# Patient Record
Sex: Male | Born: 1960 | Race: Black or African American | Hispanic: No | Marital: Single | Smoking: Current every day smoker
Health system: Southern US, Community
[De-identification: ages and names within clinical notes are randomized; demographics above are authoritative.]

## PROBLEM LIST (undated history)

## (undated) DIAGNOSIS — Z789 Other specified health status: Secondary | ICD-10-CM

## (undated) DIAGNOSIS — Z7289 Other problems related to lifestyle: Secondary | ICD-10-CM

---

## 1998-05-04 ENCOUNTER — Emergency Department (HOSPITAL_COMMUNITY): Admission: EM | Admit: 1998-05-04 | Discharge: 1998-05-04 | Payer: Self-pay | Admitting: Emergency Medicine

## 1998-05-29 ENCOUNTER — Emergency Department (HOSPITAL_COMMUNITY): Admission: EM | Admit: 1998-05-29 | Discharge: 1998-05-29 | Payer: Self-pay | Admitting: Emergency Medicine

## 1998-11-22 ENCOUNTER — Emergency Department (HOSPITAL_COMMUNITY): Admission: EM | Admit: 1998-11-22 | Discharge: 1998-11-22 | Payer: Self-pay | Admitting: Emergency Medicine

## 1999-11-01 ENCOUNTER — Emergency Department (HOSPITAL_COMMUNITY): Admission: EM | Admit: 1999-11-01 | Discharge: 1999-11-01 | Payer: Self-pay

## 2000-07-17 ENCOUNTER — Emergency Department (HOSPITAL_COMMUNITY): Admission: EM | Admit: 2000-07-17 | Discharge: 2000-07-17 | Payer: Self-pay | Admitting: Emergency Medicine

## 2000-08-17 ENCOUNTER — Encounter: Payer: Self-pay | Admitting: Orthopedic Surgery

## 2000-08-17 ENCOUNTER — Ambulatory Visit (HOSPITAL_COMMUNITY): Admission: RE | Admit: 2000-08-17 | Discharge: 2000-08-17 | Payer: Self-pay | Admitting: Orthopedic Surgery

## 2000-08-20 ENCOUNTER — Encounter: Payer: Self-pay | Admitting: Orthopedic Surgery

## 2000-08-20 ENCOUNTER — Ambulatory Visit (HOSPITAL_COMMUNITY): Admission: RE | Admit: 2000-08-20 | Discharge: 2000-08-20 | Payer: Self-pay | Admitting: Orthopedic Surgery

## 2006-12-08 ENCOUNTER — Emergency Department (HOSPITAL_COMMUNITY): Admission: EM | Admit: 2006-12-08 | Discharge: 2006-12-08 | Payer: Self-pay | Admitting: Emergency Medicine

## 2013-06-14 ENCOUNTER — Ambulatory Visit: Payer: Self-pay

## 2014-05-31 ENCOUNTER — Emergency Department (HOSPITAL_COMMUNITY): Payer: Self-pay

## 2014-05-31 ENCOUNTER — Emergency Department (HOSPITAL_COMMUNITY)
Admission: EM | Admit: 2014-05-31 | Discharge: 2014-06-01 | Disposition: A | Discharge status: Home / Self Care | Payer: Self-pay | Attending: Emergency Medicine | Admitting: Emergency Medicine

## 2014-05-31 ENCOUNTER — Encounter (HOSPITAL_COMMUNITY): Payer: Self-pay | Admitting: Emergency Medicine

## 2014-05-31 DIAGNOSIS — R109 Unspecified abdominal pain: Secondary | ICD-10-CM | POA: Insufficient documentation

## 2014-05-31 DIAGNOSIS — K2970 Gastritis, unspecified, without bleeding: Secondary | ICD-10-CM | POA: Insufficient documentation

## 2014-05-31 DIAGNOSIS — F17200 Nicotine dependence, unspecified, uncomplicated: Secondary | ICD-10-CM | POA: Insufficient documentation

## 2014-05-31 DIAGNOSIS — K2990 Gastroduodenitis, unspecified, without bleeding: Principal | ICD-10-CM

## 2014-05-31 DIAGNOSIS — R1013 Epigastric pain: Secondary | ICD-10-CM

## 2014-05-31 LAB — URINALYSIS, ROUTINE W REFLEX MICROSCOPIC
Glucose, UA: NEGATIVE mg/dL
Hgb urine dipstick: NEGATIVE
Ketones, ur: >80 mg/dL — AB
Leukocytes, UA: NEGATIVE
Nitrite: NEGATIVE
Protein, ur: 30 mg/dL — AB
Specific Gravity, Urine: 1.03 (ref 1.005–1.030)
Urobilinogen, UA: 1 mg/dL (ref 0.0–1.0)
pH: 6.5 (ref 5.0–8.0)

## 2014-05-31 LAB — CBC WITH DIFFERENTIAL/PLATELET
Basophils Absolute: 0 10*3/uL (ref 0.0–0.1)
Basophils Relative: 0 % (ref 0–1)
EOS ABS: 0 10*3/uL (ref 0.0–0.7)
EOS PCT: 0 % (ref 0–5)
HEMATOCRIT: 44.6 % (ref 39.0–52.0)
HEMOGLOBIN: 15.6 g/dL (ref 13.0–17.0)
LYMPHS ABS: 0.6 10*3/uL — AB (ref 0.7–4.0)
Lymphocytes Relative: 13 % (ref 12–46)
MCH: 33.6 pg (ref 26.0–34.0)
MCHC: 35 g/dL (ref 30.0–36.0)
MCV: 96.1 fL (ref 78.0–100.0)
MONO ABS: 0.5 10*3/uL (ref 0.1–1.0)
Monocytes Relative: 11 % (ref 3–12)
NEUTROS ABS: 3.3 10*3/uL (ref 1.7–7.7)
Neutrophils Relative %: 76 % (ref 43–77)
Platelets: 111 10*3/uL — ABNORMAL LOW (ref 150–400)
RBC: 4.64 MIL/uL (ref 4.22–5.81)
RDW: 12.3 % (ref 11.5–15.5)
WBC: 4.4 10*3/uL (ref 4.0–10.5)

## 2014-05-31 LAB — COMPREHENSIVE METABOLIC PANEL
ALBUMIN: 3.6 g/dL (ref 3.5–5.2)
ALK PHOS: 81 U/L (ref 39–117)
ALT: 88 U/L — ABNORMAL HIGH (ref 0–53)
AST: 108 U/L — AB (ref 0–37)
Anion gap: 20 — ABNORMAL HIGH (ref 5–15)
BILIRUBIN TOTAL: 0.8 mg/dL (ref 0.3–1.2)
BUN: 12 mg/dL (ref 6–23)
CO2: 23 mEq/L (ref 19–32)
Calcium: 9.7 mg/dL (ref 8.4–10.5)
Chloride: 95 mEq/L — ABNORMAL LOW (ref 96–112)
Creatinine, Ser: 0.76 mg/dL (ref 0.50–1.35)
GFR calc Af Amer: >90 mL/min (ref >90)
GFR calc non Af Amer: >90 mL/min (ref >90)
GLUCOSE: 146 mg/dL — AB (ref 70–99)
Potassium: 3.3 mEq/L — ABNORMAL LOW (ref 3.7–5.3)
SODIUM: 138 meq/L (ref 137–147)
Total Protein: 7.3 g/dL (ref 6.0–8.3)

## 2014-05-31 LAB — URINE MICROSCOPIC-ADD ON

## 2014-05-31 LAB — LIPASE, BLOOD: LIPASE: 46 U/L (ref 11–59)

## 2014-05-31 MED ORDER — PANTOPRAZOLE SODIUM 40 MG IV SOLR
40.0000 mg | Freq: Once | INTRAVENOUS | Status: AC
  Administered 2014-05-31: 40 mg via INTRAVENOUS
  Filled 2014-05-31: qty 40

## 2014-05-31 MED ORDER — HYDROMORPHONE HCL PF 1 MG/ML IJ SOLN
1.0000 mg | Freq: Once | INTRAMUSCULAR | Status: AC
  Administered 2014-05-31: 1 mg via INTRAVENOUS
  Filled 2014-05-31: qty 1

## 2014-05-31 MED ORDER — POTASSIUM CHLORIDE CRYS ER 20 MEQ PO TBCR
20.0000 meq | EXTENDED_RELEASE_TABLET | Freq: Once | ORAL | Status: AC
  Administered 2014-05-31: 20 meq via ORAL
  Filled 2014-05-31: qty 1

## 2014-05-31 MED ORDER — IOHEXOL 300 MG/ML  SOLN
25.0000 mL | Freq: Once | INTRAMUSCULAR | Status: AC | PRN
  Administered 2014-05-31: 25 mL via ORAL

## 2014-05-31 MED ORDER — HYDROCODONE-ACETAMINOPHEN 5-325 MG PO TABS
1.0000 | ORAL_TABLET | Freq: Four times a day (QID) | ORAL | Status: DC | PRN
Start: 2014-05-31 — End: 2021-04-18

## 2014-05-31 MED ORDER — IOHEXOL 300 MG/ML  SOLN
100.0000 mL | Freq: Once | INTRAMUSCULAR | Status: AC | PRN
  Administered 2014-05-31: 100 mL via INTRAVENOUS

## 2014-05-31 MED ORDER — HYDROMORPHONE HCL PF 1 MG/ML IJ SOLN
0.5000 mg | Freq: Once | INTRAMUSCULAR | Status: AC
  Administered 2014-05-31: 0.5 mg via INTRAVENOUS
  Filled 2014-05-31: qty 1

## 2014-05-31 MED ORDER — SODIUM CHLORIDE 0.9 % IV BOLUS (SEPSIS)
1000.0000 mL | Freq: Once | INTRAVENOUS | Status: AC
  Administered 2014-05-31: 1000 mL via INTRAVENOUS

## 2014-05-31 MED ORDER — ONDANSETRON HCL 4 MG/2ML IJ SOLN
4.0000 mg | Freq: Once | INTRAMUSCULAR | Status: AC
  Administered 2014-05-31: 4 mg via INTRAVENOUS
  Filled 2014-05-31: qty 2

## 2014-05-31 MED ORDER — PANTOPRAZOLE SODIUM 40 MG PO TBEC
40.0000 mg | DELAYED_RELEASE_TABLET | Freq: Every day | ORAL | Status: DC
Start: 2014-05-31 — End: 2021-04-18

## 2014-05-31 MED ORDER — FAMOTIDINE 20 MG PO TABS
20.0000 mg | ORAL_TABLET | Freq: Once | ORAL | Status: AC
  Administered 2014-05-31: 20 mg via ORAL
  Filled 2014-05-31: qty 1

## 2014-05-31 MED ORDER — LORAZEPAM 2 MG/ML IJ SOLN
1.0000 mg | Freq: Once | INTRAMUSCULAR | Status: AC
  Administered 2014-05-31: 1 mg via INTRAVENOUS
  Filled 2014-05-31: qty 1

## 2014-05-31 MED ORDER — ONDANSETRON 8 MG PO TBDP: 8.0000 mg | ORAL_TABLET | Freq: Three times a day (TID) | ORAL | Status: DC | PRN

## 2014-05-31 MED ORDER — GI COCKTAIL ~~LOC~~
30.0000 mL | Freq: Once | ORAL | Status: AC
  Administered 2014-05-31: 30 mL via ORAL
  Filled 2014-05-31: qty 30

## 2014-05-31 NOTE — ED Notes (Signed)
Per EMS pt comes from home c/o abd pain, n/v since Saturday after eating some food from [employer] that didn't look right or taste right.  Pt also state that he had alcohol on Friday and Saturday.  Pt also hasnt had a normal BM other than yesterday.

## 2014-05-31 NOTE — Discharge Instructions (Signed)
Rest. Drink plenty of fluids. Take protonix (acid blocker medication). You may also try pepcid and maalox as need for symptom relief. You may take hydrocodone as need for pain. No driving when taking hydrocodone. Also, do not take tylenol or acetaminophen containing medication when taking hydrocodone. You may take zofran as need for nausea. Avoid any alcohol use, avoid anti-inflammatory pain medication use. Follow up with primary care doctor, or here, in the next couple days if symptoms fail to improve/resolve. Return to ER right away if worse, new symptoms, fevers, persistent vomiting, other concern.  You were given pain medication in the ER - no driving for the next 6 hours.   Abdominal Pain Many things can cause abdominal pain. Usually, abdominal pain is not caused by a disease and will improve without treatment. It can often be observed and treated at home. Your health care provider will do a physical exam and possibly order blood tests and X-rays to help determine the seriousness of your pain. However, in many cases, more time must pass before a clear cause of the pain can be found. Before that point, your health care provider may not know if you need more testing or further treatment. HOME CARE INSTRUCTIONS  Monitor your abdominal pain for any changes. The following actions may help to alleviate any discomfort you are experiencing:  Only take over-the-counter or prescription medicines as directed by your health care provider.  Do not take laxatives unless directed to do so by your health care provider.  Try a clear liquid diet (broth, tea, or water) as directed by your health care provider. Slowly move to a bland diet as tolerated. SEEK MEDICAL CARE IF:  You have unexplained abdominal pain.  You have abdominal pain associated with nausea or diarrhea.  You have pain when you urinate or have a bowel movement.  You experience abdominal pain that wakes you in the night.  You have  abdominal pain that is worsened or improved by eating food.  You have abdominal pain that is worsened with eating fatty foods.  You have a fever. SEEK IMMEDIATE MEDICAL CARE IF:   Your pain does not go away within 2 hours.  You keep throwing up (vomiting).  Your pain is felt only in portions of the abdomen, such as the right side or the left lower portion of the abdomen.  You pass bloody or black tarry stools. MAKE SURE YOU:  Understand these instructions.   Will watch your condition.   Will get help right away if you are not doing well or get worse.  Document Released: 07/17/2005 Document Revised: 10/12/2013 Document Reviewed: 06/16/2013 [facility] Patient Information 2015 [place], [place]. This information is not intended to replace advice given to you by your health care provider. Make sure you discuss any questions you have with your health care provider.     Gastritis, Adult Gastritis is soreness and swelling (inflammation) of the lining of the stomach. Gastritis can develop as a sudden onset (acute) or long-term (chronic) condition. If gastritis is not treated, it can lead to stomach bleeding and ulcers. CAUSES  Gastritis occurs when the stomach lining is weak or damaged. Digestive juices from the stomach then inflame the weakened stomach lining. The stomach lining may be weak or damaged due to viral or bacterial infections. One common bacterial infection is the Helicobacter pylori infection. Gastritis can also result from excessive alcohol consumption, taking certain medicines, or having too much acid in the stomach.  SYMPTOMS  In some cases, there  are no symptoms. When symptoms are present, they may include:  Pain or a burning sensation in the upper abdomen.  Nausea.  Vomiting.  An uncomfortable feeling of fullness after eating. DIAGNOSIS  Your caregiver may suspect you have gastritis based on your symptoms and a physical exam. To determine the cause of your  gastritis, your caregiver may perform the following:  Blood or stool tests to check for the H pylori bacterium.  Gastroscopy. A thin, flexible tube (endoscope) is passed down the esophagus and into the stomach. The endoscope has a light and camera on the end. Your caregiver uses the endoscope to view the inside of the stomach.  Taking a tissue sample (biopsy) from the stomach to examine under a microscope. TREATMENT  Depending on the cause of your gastritis, medicines may be prescribed. If you have a bacterial infection, such as an H pylori infection, antibiotics may be given. If your gastritis is caused by too much acid in the stomach, H2 blockers or antacids may be given. Your caregiver may recommend that you stop taking aspirin, ibuprofen, or other nonsteroidal anti-inflammatory drugs (NSAIDs). HOME CARE INSTRUCTIONS  Only take over-the-counter or prescription medicines as directed by your caregiver.  If you were given antibiotic medicines, take them as directed. Finish them even if you start to feel better.  Drink enough fluids to keep your urine clear or pale yellow.  Avoid foods and drinks that make your symptoms worse, such as:  Caffeine or alcoholic drinks.  Chocolate.  Peppermint or mint flavorings.  Garlic and onions.  Spicy foods.  Citrus fruits, such as oranges, lemons, or limes.  Tomato-based foods such as sauce, chili, salsa, and pizza.  Fried and fatty foods.  Eat small, frequent meals instead of large meals. SEEK IMMEDIATE MEDICAL CARE IF:   You have black or dark red stools.  You vomit blood or material that looks like coffee grounds.  You are unable to keep fluids down.  Your abdominal pain gets worse.  You have a fever.  You do not feel better after 1 week.  You have any other questions or concerns. MAKE SURE YOU:  Understand these instructions.  Will watch your condition.  Will get help right away if you are not doing well or get  worse. Document Released: 10/01/2001 Document Revised: 04/07/2012 Document Reviewed: 11/20/2011 [facility] Patient Information 2015 [place], [place]. This information is not intended to replace advice given to you by your health care provider. Make sure you discuss any questions you have with your health care provider.      Emergency Department Resource Guide 1) Find a Doctor and Pay Out of Pocket Although you won't have to find out who is covered by your insurance plan, it is a good idea to ask around and get recommendations. You will then need to call the office and see if the doctor you have chosen will accept you as a new patient and what types of options they offer for patients who are self-pay. Some doctors offer discounts or will set up payment plans for their patients who do not have insurance, but you will need to ask so you aren't surprised when you get to your appointment.  2) Contact Your Local Health Department Not all health departments have doctors that can see patients for sick visits, but many do, so it is worth a call to see if yours does. If you don't know where your local health department is, you can check in your phone book. The CDC  also has a tool to help you locate your state's health department, and many state websites also have listings of all of their local health departments.  3) Find a Walk-in Clinic If your illness is not likely to be very severe or complicated, you may want to try a walk in clinic. These are popping up all over the country in pharmacies, drugstores, and shopping centers. They're usually staffed by nurse practitioners or physician assistants that have been trained to treat common illnesses and complaints. They're usually fairly quick and inexpensive. However, if you have serious medical issues or chronic medical problems, these are probably not your best option.  No Primary Care Doctor: - Call Health Connect at  [phone] - they can help you locate a  primary care doctor that  accepts your insurance, provides certain services, etc. - Physician Referral Service- [phone]  Chronic Pain Problems: Organization         Address  Phone   Notes  [facility]  [phone] Patients need to be referred by their primary care doctor.   Medication Assistance: Organization         Address  Phone   Notes  [facility] Medication [facility] [address] [place], [place] [address] [phone] --Must be a resident of [facility] -- Must have NO insurance coverage whatsoever (no Medicaid/ Medicare, etc.) -- The pt. MUST have a primary care doctor that directs their care regularly and follows them in the community   MedAssist  [phone]   [employer]  [phone]    Agencies that provide inexpensive medical care: Organization         Address  Phone   Notes  [facility]  [phone]   [facility]    [phone]   [facility] [address] [place], [place] [address] [phone]   [facility] [address] [place], [place] [phone]   Planned Parenthood    [phone]   [facility]    [phone]   Community Health and [facility]  [address], [place] Phone:  [phone], Fax:  [phone] Hours of Operation:  9 am - 6 pm, M-F.  Also accepts Medicaid/Medicare and self-pay.  [facility]  [address], [place] Phone: [phone], Fax: [phone]. Hours of Operation:  8:30 am - 5:30 pm, M-F.  Also accepts Medicaid and self-pay.  [facility] [place] [address], [place] Phone: [phone]   [facility] [address] Natasha Bence [place], [place] [phone], Ext. [phone] Mondays & Thursdays: 7-9 AM.  First 15 patients are seen on a first come, first serve basis.    Medicaid-accepting [facility]  Providers:  Organization         Address  Phone   Notes  [facility] [address], [place] [phone] Also accepts self-pay patients.  [facility] [address] Laurell Josephs [place], [place]  [phone]   [facility] [address], [place] [phone]   [facility] [address], [place] [phone]   Renaye Rakers [address], [place]   [phone] Only accepts [place] Access [place] patients after they have their name applied to their card.   Self-Pay (no insurance)  in [facility]:  Organization         Address  Phone   Notes  Sickle Cell Patients, [facility] [place] [phone]   [facility] Urgent Care [place] [phone]   [facility] [place]  [place], [address],  [phone]   [facility]/Dr. Osei-Bonsu  [address], [place] or [address], [place] [phone] Phone number for both [place] and [place] locations is the same.  Urgent Medical and [facility] [address] [place] [phone]   [facility] [address], [place] or [address] [phone]   [facility] [address] [place] [phone], phone; [phone], fax Sees patients 1st and 3rd Saturday of every month.  Must not qualify for public or private insurance (i.e. Medicaid, Medicare, [place] Health Choice, Veterans' Benefits)  Household income should be no more than 200% of the poverty level The clinic cannot treat you if you are pregnant or think you are pregnant  Sexually transmitted diseases are not treated at the clinic.    Dental Care: Organization         Address  Phone  Notes  [facility] Department of [facility] [place] [phone] Accepts children up to age 33 who are enrolled in [place] or [place]; pregnant women with a Medicaid card; and children who have applied for Medicaid or [place] Health Choice, but were declined, whose parents can pay a reduced fee at time of service.  [facility] Department of [facility]  [address], [place] [phone] Accepts children up to age 86 who are enrolled in [place] or [place]; pregnant women with a Medicaid card; and children who have applied for Medicaid or  Health Choice, but were declined, whose parents can pay a reduced fee at time of service.  [facility] PRO[facility]  [place] [phone] Patients are seen by appointment only. Walk-ins are not accepted. [place] will see patients 15 years of age and older. Monday - Tuesday (8am-5pm) Most Wednesdays (8:30-5pm) $30 per visit, cash only  [facility] Adult Dental Access PROGRAM  [address], [facility] [phone] Patients are seen by appointment only. Walk-ins are not accepted. [place] will see patients 67 years of age and older. One Wednesday Evening (Monthly: Volunteer Based).  $30 per visit, cash only  [place]  [phone] for adults; Children under age 79, call Graduate Pediatric Dentistry at [phone]. Children aged 40-14, please call [phone] to request a pediatric application.  Dental services are provided in all areas of dental care including fillings, crowns and bridges, complete and partial dentures, implants, gum treatment, root canals, and extractions. Preventive care is also provided. Treatment is provided to both adults and children. Patients are selected via a lottery and there is often a waiting list.   [facility] [address] [place]  [phone] [email or web address]   [facility]  [address] [place], [place] [phone], Ext. [phone] Second and Fourth Thursday of each month, opens at 6:30 AM; Clinic ends at 9 AM.  Patients are seen on a first-come first-served basis, and a limited number are seen during  each clinic.   [facility]  [address] Hillard Danker [place], [place] [phone]   Eligibility Requirements You must have lived in [place], [place], or [place] counties for at least the last three months.   You cannot be eligible for state or federal sponsored [employer], including [employer], [place], or [employer].   You generally cannot be eligible for healthcare insurance through your employer.    How to apply: Eligibility screenings are held every Tuesday and Wednesday afternoon from 1:00 pm until 4:00 pm. You do not need an appointment for the interview!  [facility] [address], [place], [place]   [place]  [place] Department  [place]  [phone]    Behavioral Health Resources in the Community: Intensive Outpatient Programs Organization         Address  Phone  Notes  [place]. [address], [place], [place] [phone]   [facility] [address], [place], [place]   ADS: [facility] Drug S[facility] [address], [place], [place]   [place] [address],  [place], [place] or [phone]   Substance Abuse Resources Organization         Address  Phone  Notes  Alcohol and Drug Services  [phone]   [place]  [phone]   [facility] [place]   Chinita Pester  [phone]   Residential & Outpatient Substance Abuse Program  [phone]   Psychological Services Organization         Address  Phone  Notes  [facility] [place]  [place]  [phone]   [place] [address] [place] or [phone]    Mobile Crisis Teams Organization         Address  Phone  Notes  [facility], Mobile Crisis Care Unit  [phone]   [facility]  [address] [place], [place]   [place] Levels [address], [place] [phone]    Self-Help/Support Groups Organization         Address  Phone             Notes  [place]. of [place] - variety of support groups  [place] Call for more information  Narcotics Anonymous (NA), [facility] [address], [employer] [place]  2 meetings at this location   Special educational needs teacher         Address  Phone  Notes  [facility]AP Residential Treatment [place],    [place]  [phone]   [facility]  [address] [place] [phone], [place], [place]   [place], [place] [phone] Admissions: 8am-3pm M-F  Incentives Substance [place] [address]    [place], [place] [phone]   [facility] [address] [place], [place]   [facility] [address]  [place], [place]   [facility]ight Programs - Intensive Outpatient [address] Kristeen [address], [place], [place]   [facility] ([place] [employer].,  [place], [place] or [phone]   [facility] Services (RTS) [address] [place], [place] Accepts Medicaid  [facility] [place] [address],  [place]  [phone] Substance Abuse/Addiction Treatment   [facility] Organization         Address  Phone  Notes  [facility]  [phone]   Domenic Schwab, PhD [address] Arlis Porta [place], [place]   [phone] or [phone]    [place], [place] [phone]   [address], [place], [place] [phone] Insurance/Medicaid/sponsorship through [facility] and Families [address] [place]                                    [place], [place] [phone] [place] [address], [place] [phone]    Dr. Adele Schilder  [phone]   [facility] Dept. 1) [address] [place] 2) [place] 3)  [place] [address], [place] [phone]  [phone]   [place] [phone] or [phone] (After Hours)

## 2014-05-31 NOTE — ED Provider Notes (Signed)
CSN: [id number]     Arrival date & time 05/31/14  1800 History   First MD Initiated Contact with Patient 05/31/14 1850     Chief Complaint  Patient presents with  . Abdominal Pain  . Nausea  . Emesis     (Consider location/radiation/quality/duration/timing/severity/associated sxs/prior Treatment) Patient is a 53 y.o. male presenting with abdominal pain and vomiting. The history is provided by the patient.  Abdominal Pain Associated symptoms: vomiting   Associated symptoms: no chest pain, no chills, no constipation, no cough, no dysuria, no fever, no hematuria, no shortness of breath and no sore throat   Emesis Associated symptoms: abdominal pain   Associated symptoms: no chills, no headaches and no sore throat   pt c/o epigastric/upper abd pain for the past 3 days, associated w nv.  Emesis, clear or color recently ingested liquids, not bloody or bilious. No abd distension or diarrhea. No constipation. No known ill contacts or bad food ingestion. No hx same symptoms. Denies fever or chills. Pain dull, moderate, non radiating, no specific exacerbating or alleviating factors. Had drank more etoh than normal over the weekend, denies daily etoh use. ?remote hx pud, no hx gallstones or pancreatitis. Denies prior abd surgery. No chest pain or discomfort. No cough or sob. No abd trauma.      History reviewed. No pertinent past medical history. History reviewed. No pertinent past surgical history. No family history on file. History  Substance Use Topics  . Smoking status: Current Every Day Smoker -- 1.00 packs/day    Types: Cigars  . Smokeless tobacco: Never Used  . Alcohol Use: 12.6 oz/week    14 Shots of liquor, 7 Cans of beer per week    Review of Systems  Constitutional: Negative for fever and chills.  HENT: Negative for sore throat.   Eyes: Negative for redness.  Respiratory: Negative for cough and shortness of breath.   Cardiovascular: Negative for chest pain.   Gastrointestinal: Positive for vomiting and abdominal pain. Negative for constipation.  Genitourinary: Negative for dysuria, hematuria and flank pain.  Musculoskeletal: Negative for back pain and neck pain.  Skin: Negative for rash.  Neurological: Negative for headaches.  Hematological: Does not bruise/bleed easily.  Psychiatric/Behavioral: Negative for confusion.      Allergies  Review of patient's allergies indicates no known allergies.  Home Medications   Prior to Admission medications   Not on File   BP 124/83  Pulse 101  Temp(Src) 98.1 F (36.7 C) (Oral)  Resp 14  Ht 6\' 1"  (1.854 m)  Wt 165 lb (74.844 kg)  BMI 21.77 kg/m2  SpO2 100% Physical Exam  Nursing note and vitals reviewed. Constitutional: He is oriented to person, place, and time. He appears well-developed and well-nourished. No distress.  HENT:  Head: Atraumatic.  Mouth/Throat: Oropharynx is clear and moist.  Eyes: Conjunctivae are normal. No scleral icterus.  Neck: Neck supple. No tracheal deviation present.  Cardiovascular: Normal rate, regular rhythm, normal heart sounds and intact distal pulses.   Pulmonary/Chest: Effort normal and breath sounds normal. No accessory muscle usage. No respiratory distress.  Abdominal: Soft. Bowel sounds are normal. He exhibits no distension and no mass. There is tenderness. There is no rebound and no guarding.  Moderate mid abd and epigastric tenderness. No incarc hernia.   Genitourinary:  No cva tenderness  Musculoskeletal: Normal range of motion. He exhibits no edema and no tenderness.  Neurological: He is alert and oriented to person, place, and time.  Skin: Skin is warm  and dry. He is not diaphoretic.  Psychiatric: He has a normal mood and affect.    ED Course  Procedures (including critical care time) Labs Review  Results for orders placed during the hospital encounter of 05/31/14  CBC WITH DIFFERENTIAL      Result Value Ref Range   WBC 4.4  4.0 - 10.5  K/uL   RBC 4.64  4.22 - 5.81 MIL/uL   Hemoglobin 15.6  13.0 - 17.0 g/dL   HCT 16.1  09.6 - 04.5 %   MCV 96.1  78.0 - 100.0 fL   MCH 33.6  26.0 - 34.0 pg   MCHC 35.0  30.0 - 36.0 g/dL   RDW 40.9  81.1 - 91.4 %   Platelets 111 (*) 150 - 400 K/uL   Neutrophils Relative % 76  43 - 77 %   Lymphocytes Relative 13  12 - 46 %   Monocytes Relative 11  3 - 12 %   Eosinophils Relative 0  0 - 5 %   Basophils Relative 0  0 - 1 %   Neutro Abs 3.3  1.7 - 7.7 K/uL   Lymphs Abs 0.6 (*) 0.7 - 4.0 K/uL   Monocytes Absolute 0.5  0.1 - 1.0 K/uL   Eosinophils Absolute 0.0  0.0 - 0.7 K/uL   Basophils Absolute 0.0  0.0 - 0.1 K/uL   Smear Review MORPHOLOGY UNREMARKABLE    COMPREHENSIVE METABOLIC PANEL      Result Value Ref Range   Sodium 138  137 - 147 mEq/L   Potassium 3.3 (*) 3.7 - 5.3 mEq/L   Chloride 95 (*) 96 - 112 mEq/L   CO2 23  19 - 32 mEq/L   Glucose, Bld 146 (*) 70 - 99 mg/dL   BUN 12  6 - 23 mg/dL   Creatinine, Ser 7.82  0.50 - 1.35 mg/dL   Calcium 9.7  8.4 - 95.6 mg/dL   Total Protein 7.3  6.0 - 8.3 g/dL   Albumin 3.6  3.5 - 5.2 g/dL   AST 213 (*) 0 - 37 U/L   ALT 88 (*) 0 - 53 U/L   Alkaline Phosphatase 81  39 - 117 U/L   Total Bilirubin 0.8  0.3 - 1.2 mg/dL   GFR calc non Af Amer >90  >90 mL/min   GFR calc Af Amer >90  >90 mL/min   Anion gap 20 (*) 5 - 15  LIPASE, BLOOD      Result Value Ref Range   Lipase 46  11 - 59 U/L  URINALYSIS, ROUTINE W REFLEX MICROSCOPIC      Result Value Ref Range   Color, Urine AMBER (*) YELLOW   APPearance CLEAR  CLEAR   Specific Gravity, Urine 1.030  1.005 - 1.030   pH 6.5  5.0 - 8.0   Glucose, UA NEGATIVE  NEGATIVE mg/dL   Hgb urine dipstick NEGATIVE  NEGATIVE   Bilirubin Urine SMALL (*) NEGATIVE   Ketones, ur >80 (*) NEGATIVE mg/dL   Protein, ur 30 (*) NEGATIVE mg/dL   Urobilinogen, UA 1.0  0.0 - 1.0 mg/dL   Nitrite NEGATIVE  NEGATIVE   Leukocytes, UA NEGATIVE  NEGATIVE  URINE MICROSCOPIC-ADD ON      Result Value Ref Range   Squamous  Epithelial / LPF RARE  RARE   WBC, UA 0-2  <3 WBC/hpf   Urine-Other MUCOUS PRESENT     Ct Abdomen Pelvis W Contrast  05/31/2014   CLINICAL DATA:  Abdominal pain  and nausea and vomiting.  EXAM: CT ABDOMEN AND PELVIS WITH CONTRAST  TECHNIQUE: Multidetector CT imaging of the abdomen and pelvis was performed using the standard protocol following bolus administration of intravenous contrast.  CONTRAST:  25mL OMNIPAQUE IOHEXOL 300 MG/ML SOLN, OMNIPAQUE IOHEXOL 300 MG/ML SOLN  COMPARISON:  No priors.  FINDINGS: Lung Bases: Unremarkable.  Abdomen/Pelvis: Diffuse low attenuation throughout the hepatic parenchyma, compatible with severe hepatic steatosis. The appearance of the gallbladder, pancreas, spleen, bilateral adrenal glands and bilateral kidneys is unremarkable. No significant volume of ascites. No pneumoperitoneum. No pathologic distention of small bowel. No lymphadenopathy identified in the abdomen or pelvis. Prostate gland and urinary bladder are unremarkable in appearance.  Musculoskeletal: Bilateral pars defects at L5 with 6 mm of anterolisthesis of L5 upon S1. There are no aggressive appearing lytic or blastic lesions noted in the visualized portions of the skeleton.  IMPRESSION: 1. No acute findings to account for the patient's symptoms. 2. Severe hepatic steatosis. 3. Grade 1 spondylolisthesis of L5 upon S1.   Electronically Signed   By: Trudie Reed M.D.   On: 05/31/2014 21:27     MDM  Iv ns. protonix iv, dilaudid iv.  Reviewed nursing notes and prior charts for additional history.   Pt requests additional pain med, dilaudid 1 mg iv.  Ct noted.  Given recent etoh use/abuse ?gastritis.   Pt appears v anxious. Ativan 1 mg iv for symptom relief.  pepcid and gi cocktail.  Discussed ct w pt.    Ct neg acute. Afeb. No nv.    Pt appears stable for d/c.      Suzi Roots, MD 05/31/14 2230

## 2014-06-01 ENCOUNTER — Telehealth (HOSPITAL_BASED_OUTPATIENT_CLINIC_OR_DEPARTMENT_OTHER): Payer: Self-pay | Admitting: Emergency Medicine

## 2015-07-12 IMAGING — CT CT ABD-PELV W/ CM
1 of 3 series · 14 of 32 positions shown, 19 images · IV contrast (OMNIPAQUE 300)
Comparison: No priors.

CLINICAL DATA: Abdominal pain and nausea and vomiting.

EXAM:
CT ABDOMEN AND PELVIS WITH CONTRAST
TECHNIQUE: Multidetector CT imaging of the abdomen and pelvis was performed
using the standard protocol following bolus administration of
intravenous contrast.
CONTRAST:  25mL OMNIPAQUE IOHEXOL 300 MG/ML SOLN, 100mL OMNIPAQUE
IOHEXOL 300 MG/ML SOLN

[Series 2: abd/pel with · axial · 0.75mm/px · z∈[+888,+1332]mm · 14 of 101 slices shown, 19 images]
[im 6/101  soft-tissue]
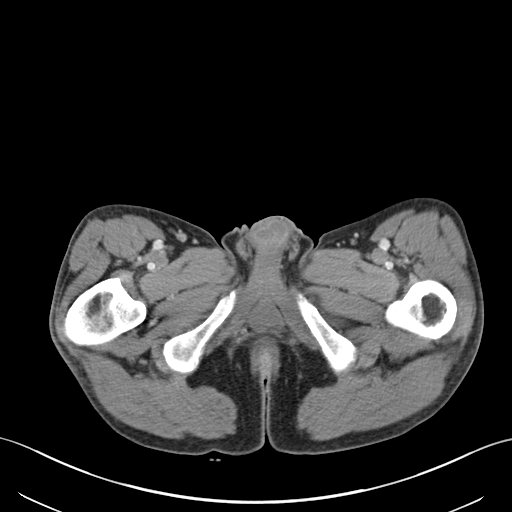
[im 6/101  bone]
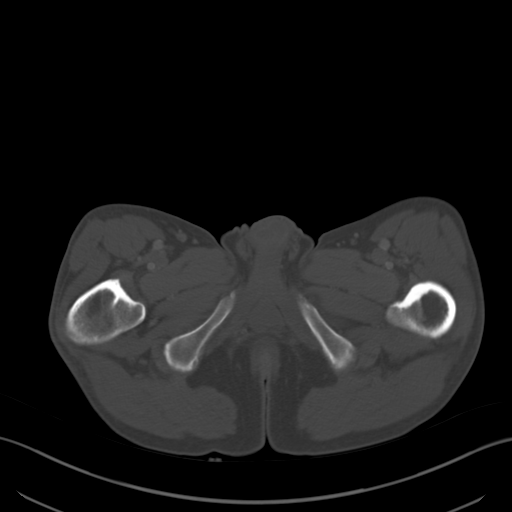
[im 12/101  soft-tissue]
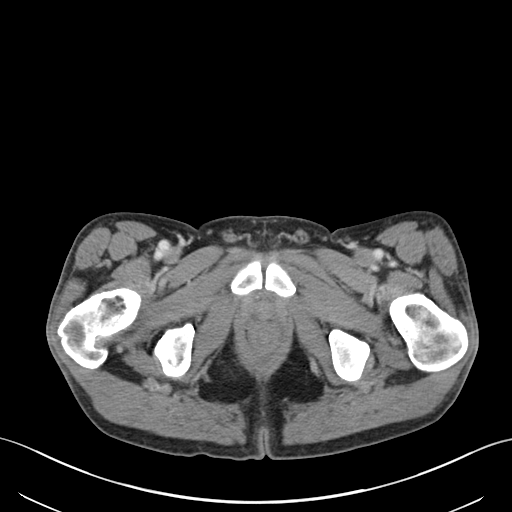
[im 23/101  soft-tissue]
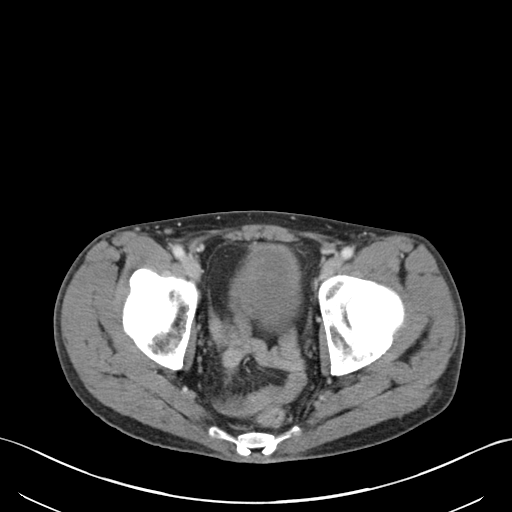
[im 28/101  soft-tissue]
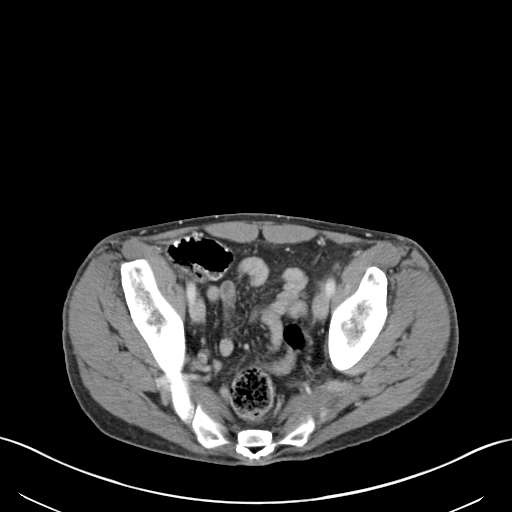
[im 34/101  soft-tissue]
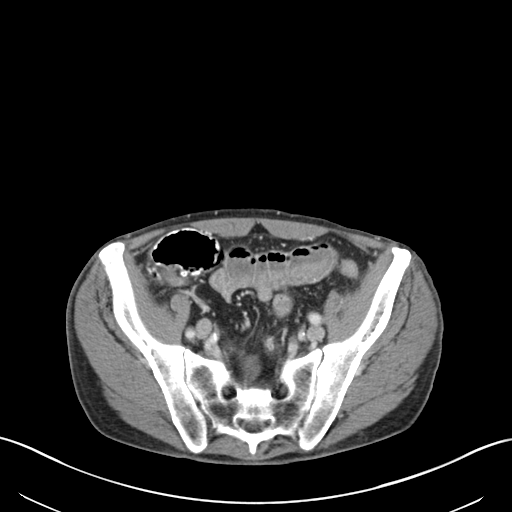
[im 45/101  soft-tissue]
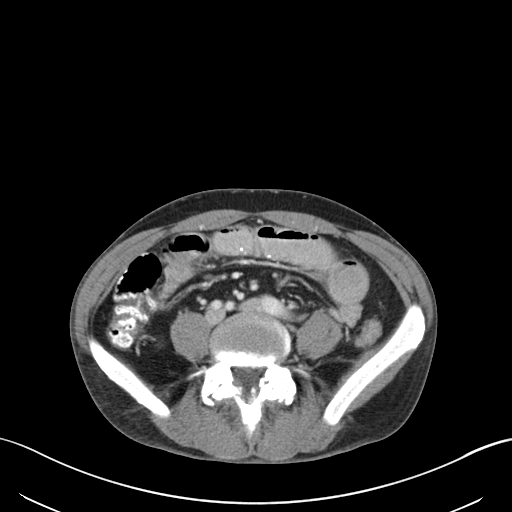
[im 51/101  soft-tissue]
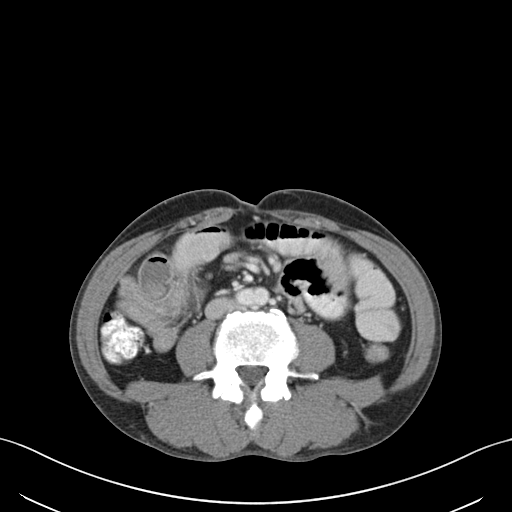
[im 56/101  soft-tissue]
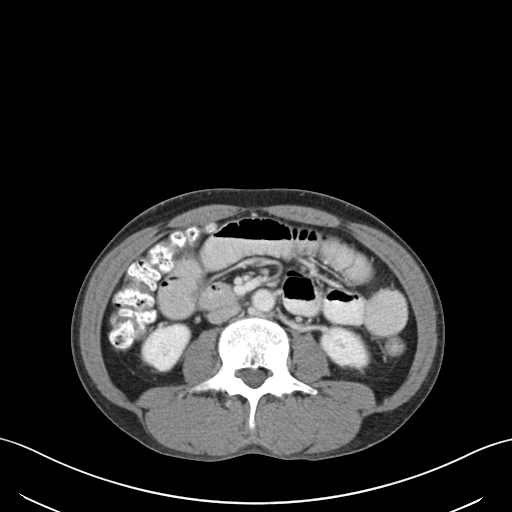
[im 67/101  soft-tissue]
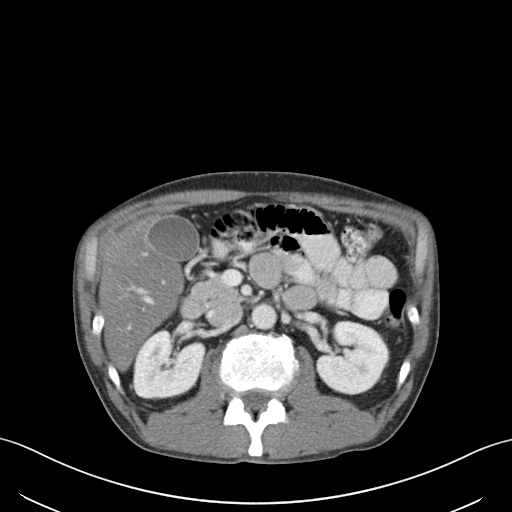
[im 67/101  bone]
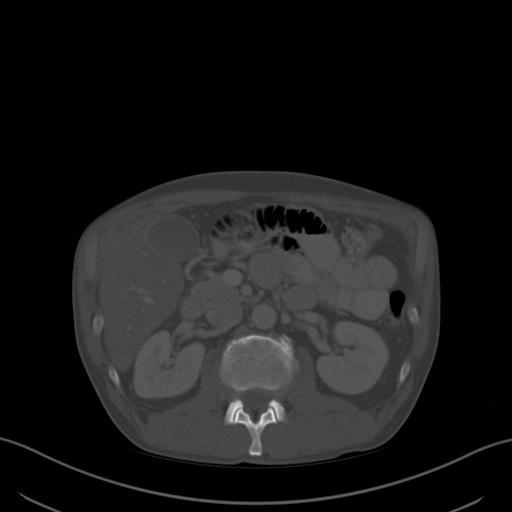
[im 73/101  soft-tissue]
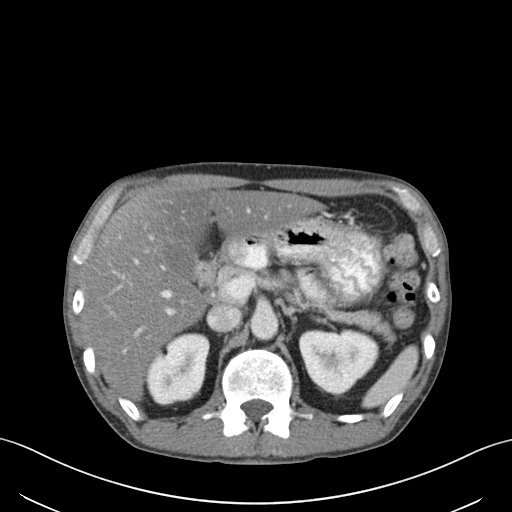
[im 78/101  soft-tissue]
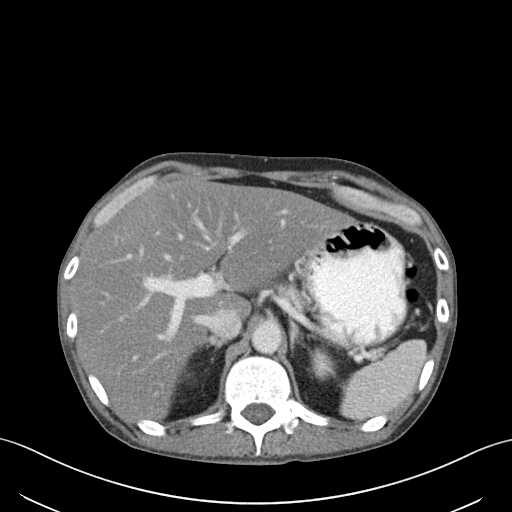
[im 78/101  lung]
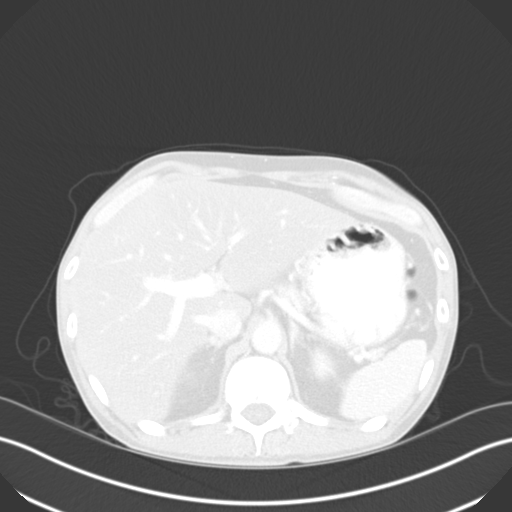
[im 84/101  lung]
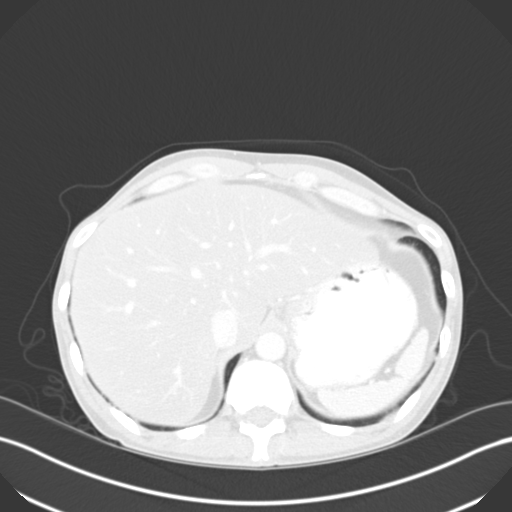
[im 89/101  soft-tissue]
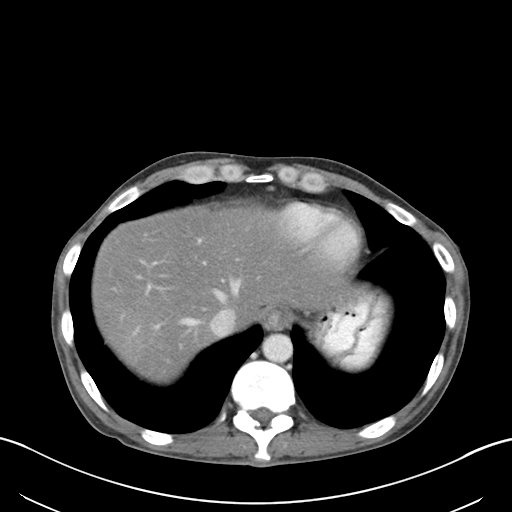
[im 89/101  lung]
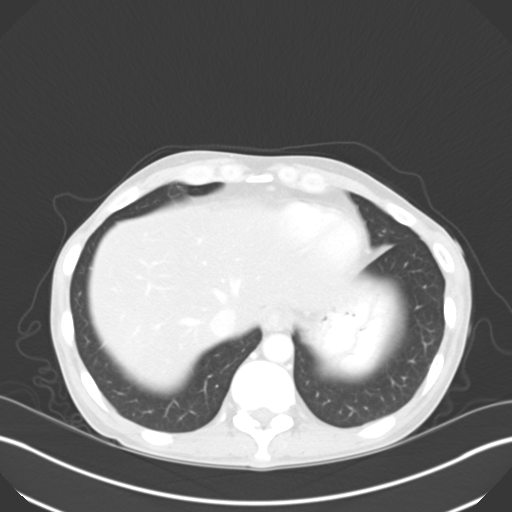
[im 95/101  soft-tissue]
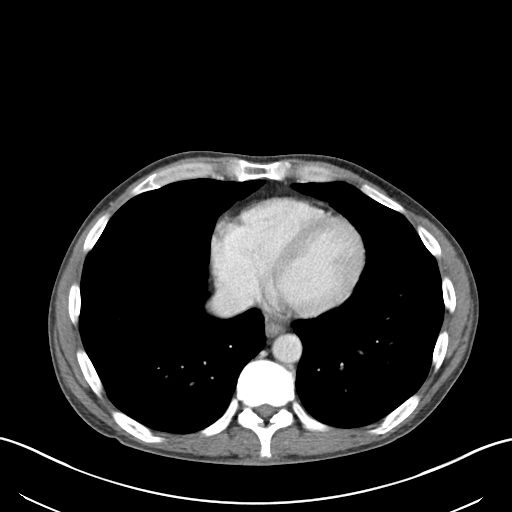
[im 95/101  lung]
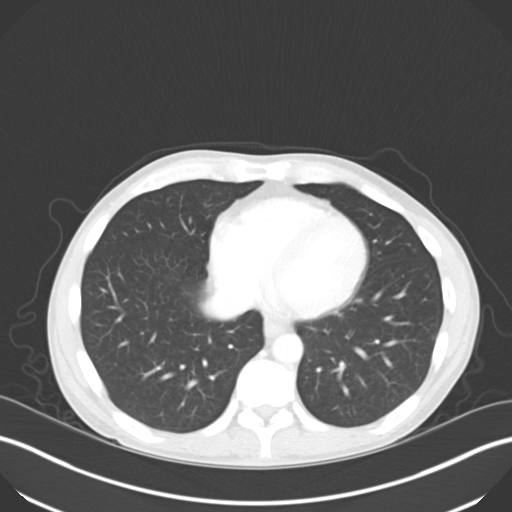

[14 of 32 positions shown; findings below may reference images not displayed]

FINDINGS: Lung Bases: Unremarkable.

Abdomen/Pelvis: Diffuse low attenuation throughout the hepatic
parenchyma, compatible with severe hepatic steatosis. The appearance
of the gallbladder, pancreas, spleen, bilateral adrenal glands and
bilateral kidneys is unremarkable. No significant volume of ascites.
No pneumoperitoneum. No pathologic distention of small bowel. No
lymphadenopathy identified in the abdomen or pelvis. Prostate gland
and urinary bladder are unremarkable in appearance.

Musculoskeletal: Bilateral pars defects at L5 with 6 mm of
anterolisthesis of L5 upon S1. There are no aggressive appearing
lytic or blastic lesions noted in the visualized portions of the
skeleton.
IMPRESSION: 1. No acute findings to account for the patient's symptoms.
2. Severe hepatic steatosis.
3. Grade 1 spondylolisthesis of L5 upon S1.

## 2018-06-08 ENCOUNTER — Other Ambulatory Visit: Payer: Self-pay

## 2018-06-08 ENCOUNTER — Emergency Department (HOSPITAL_COMMUNITY)
Admission: EM | Admit: 2018-06-08 | Discharge: 2018-06-09 | Discharge status: Against Medical Advice | Payer: Self-pay | Attending: Emergency Medicine | Admitting: Emergency Medicine

## 2018-06-08 ENCOUNTER — Encounter (HOSPITAL_COMMUNITY): Payer: Self-pay

## 2018-06-08 DIAGNOSIS — Z79899 Other long term (current) drug therapy: Secondary | ICD-10-CM | POA: Insufficient documentation

## 2018-06-08 DIAGNOSIS — I471 Supraventricular tachycardia: Secondary | ICD-10-CM | POA: Insufficient documentation

## 2018-06-08 DIAGNOSIS — F17200 Nicotine dependence, unspecified, uncomplicated: Secondary | ICD-10-CM | POA: Insufficient documentation

## 2018-06-08 LAB — I-STAT TROPONIN, ED: Troponin i, poc: 0.09 ng/mL (ref 0.00–0.08)

## 2018-06-08 NOTE — ED Triage Notes (Signed)
Per ems PT C/O anxiety, sob,  And dizziness when walking. Upon arrival pt in SVT HR 220 BP 98/60. Administered 6mg  adenosine enroute. Pt converted to ST 120. No CP reported

## 2018-06-08 NOTE — ED Provider Notes (Signed)
[facility]   CSN: [id number] Arrival date & time: 06/08/18  2237     History   Chief Complaint Chief Complaint  Patient presents with  . Tachycardia    HPI Alvin Clark is a 57 y.o. male.  Patient presents to the emergency department for evaluation of her palpitations and shortness of breath.  Patient had onset of the symptoms as well as dizziness, weakness and feeling like he was going to pass out.  He noted that his heart was racing.  EMS reports that upon arrival he was in an SVT over 200 bpm.  He was given adenosine and converted to sinus tachycardia.  Patient reports improvement of symptoms.     History reviewed. No pertinent past medical history.  There are no active problems to display for this patient.   History reviewed. No pertinent surgical history.      Home Medications    Prior to Admission medications   Medication Sig Start Date End Date Taking? Authorizing Provider  HYDROcodone-acetaminophen (NORCO/VICODIN) 5-325 MG per tablet Take 1-2 tablets by mouth every 6 (six) hours as needed for moderate pain. Patient not taking: Reported on 06/09/2018 05/31/14   Cathren LaineSteinl, Kevin, MD  ondansetron (ZOFRAN ODT) 8 MG disintegrating tablet Take 1 tablet (8 mg total) by mouth every 8 (eight) hours as needed for nausea or vomiting. Patient not taking: Reported on 06/09/2018 05/31/14   Cathren LaineSteinl, Kevin, MD  pantoprazole (PROTONIX) 40 MG tablet Take 1 tablet (40 mg total) by mouth daily. Patient not taking: Reported on 06/09/2018 05/31/14   Cathren LaineSteinl, Kevin, MD    Family History History reviewed. No pertinent family history.  Social History Social History   Tobacco Use  . Smoking status: Current Every Day Smoker    Packs/day: 1.00    Types: Cigars  . Smokeless tobacco: Never Used  Substance Use Topics  . Alcohol use: Yes    Alcohol/week: 21.0 standard drinks    Types: 14 Shots of liquor, 7 Cans of beer per week  . Drug  use: No     Allergies   Patient has no known allergies.   Review of Systems Review of Systems  Respiratory: Positive for shortness of breath.   Cardiovascular: Positive for palpitations.  Psychiatric/Behavioral: The patient is nervous/anxious.   All other systems reviewed and are negative.    Physical Exam Updated Vital Signs BP (!) 149/104   Pulse 100   Resp 19   Ht 6\' 1"  (1.854 m)   Wt 72.6 kg   SpO2 98%   BMI 21.11 kg/m   Physical Exam  Constitutional: He is oriented to person, place, and time. He appears well-developed and well-nourished. No distress.  HENT:  Head: Normocephalic and atraumatic.  Right Ear: Hearing normal.  Left Ear: Hearing normal.  Nose: Nose normal.  Mouth/Throat: Oropharynx is clear and moist and mucous membranes are normal.  Eyes: Pupils are equal, round, and reactive to light. Conjunctivae and EOM are normal.  Neck: Normal range of motion. Neck supple.  Cardiovascular: Regular rhythm, S1 normal and S2 normal. Tachycardia present. Exam reveals no gallop and no friction rub.  No murmur heard. Pulmonary/Chest: Effort normal and breath sounds normal. No respiratory distress. He exhibits no tenderness.  Abdominal: Soft. Normal appearance and bowel sounds are normal. There is no hepatosplenomegaly. There is no tenderness. There is no rebound, no guarding, no tenderness at McBurney's point and negative Murphy's sign. No hernia.  Musculoskeletal: Normal range of motion.  Neurological: He is alert and oriented to person, place, and time. He has normal strength. No cranial nerve deficit or sensory deficit. Coordination normal. GCS eye subscore is 4. GCS verbal subscore is 5. GCS motor subscore is 6.  Skin: Skin is warm, dry and intact. No rash noted. No cyanosis.  Psychiatric: He has a normal mood and affect. His speech is normal and behavior is normal. Thought content normal.  Nursing note and vitals reviewed.    ED Treatments / Results  Labs (all  labs ordered are listed, but only abnormal results are displayed) Labs Reviewed  CBC WITH DIFFERENTIAL/PLATELET - Abnormal; Notable for the following components:      Result Value   Platelets 141 (*)    All other components within normal limits  BASIC METABOLIC PANEL - Abnormal; Notable for the following components:   Glucose, Bld 111 (*)    BUN <5 (*)    All other components within normal limits  ETHANOL - Abnormal; Notable for the following components:   Alcohol, Ethyl (B) 234 (*)    All other components within normal limits  RAPID URINE DRUG SCREEN, HOSP PERFORMED - Abnormal; Notable for the following components:   Cocaine POSITIVE (*)    All other components within normal limits  I-STAT TROPONIN, ED - Abnormal; Notable for the following components:   Troponin i, poc 0.09 (*)    All other components within normal limits  TSH    EKG EKG Interpretation  Date/Time:  Monday June 08 2018 22:49:04 EDT Ventricular Rate:  112 PR Interval:    QRS Duration: 89 QT Interval:  350 QTC Calculation: 478 R Axis:   79 Text Interpretation:  Sinus tachycardia Probable left atrial enlargement Anteroseptal infarct, age indeterminate ST elevation, consider inferior injury No previous tracing Confirmed by Gilda Crease [phone]) on 06/08/2018 10:59:19 PM   Radiology No results found.  Procedures Procedures (including critical care time)  Medications Ordered in ED Medications - No data to display   Initial Impression / Assessment and Plan / ED Course  I have reviewed the triage vital signs and the nursing notes.  Pertinent labs & imaging results that were available during my care of the patient were reviewed by me and considered in my medical decision making (see chart for details).     Patient presented to the ER for evaluation of weakness, dizziness and rapid heartbeat.  EMS documented SVT of 220 bpm.  He was given adenosine and converted to sinus tachycardia.  When I entered  the room to evaluate him he became irate because he did not have Internet connection and phone service.  I redirected him to the guest Wi-Fi network and the phone hanging on the wall next to his bed.  He told me that I had him "locked up" in the room.  He was informed that the door was open and he was not locked in.  I did discuss with him his medical condition.  He was informed that he had a potentially dangerous and even fatal heart condition, need to stay in the ER for further work-up.  He initially agreed to work-up, but then became angry and agitated again and ultimately demanded to leave AGAINST MEDICAL ADVICE.  He understands the risk of leaving.  He is not impaired, has the capacity to make his decisions and was informed of the danger of leaving.  CRITICAL CARE Performed by: Gilda Crease   Total critical care time: 35 minutes  Critical care time  was exclusive of separately billable procedures and treating other patients.  Critical care was necessary to treat or prevent imminent or life-threatening deterioration.  Critical care was time spent personally by me on the following activities: development of treatment plan with patient and/or surrogate as well as nursing, discussions with consultants, evaluation of patient's response to treatment, examination of patient, obtaining history from patient or surrogate, ordering and performing treatments and interventions, ordering and review of laboratory studies, ordering and review of radiographic studies, pulse oximetry and re-evaluation of patient's condition.   Final Clinical Impressions(s) / ED Diagnoses   Final diagnoses:  SVT (supraventricular tachycardia) (HCC)    [facility]    None       Chenita Ruda, Canary Brimhristopher J, MD 06/09/18 [phone]

## 2018-06-09 LAB — CBC WITH DIFFERENTIAL/PLATELET
Abs Immature Granulocytes: 0 10*3/uL (ref 0.0–0.1)
BASOS ABS: 0 10*3/uL (ref 0.0–0.1)
Basophils Relative: 0 %
EOS PCT: 2 %
Eosinophils Absolute: 0.1 10*3/uL (ref 0.0–0.7)
HCT: 42.7 % (ref 39.0–52.0)
Hemoglobin: 14.2 g/dL (ref 13.0–17.0)
IMMATURE GRANULOCYTES: 0 %
LYMPHS PCT: 34 %
Lymphs Abs: 1.8 10*3/uL (ref 0.7–4.0)
MCH: 32.9 pg (ref 26.0–34.0)
MCHC: 33.3 g/dL (ref 30.0–36.0)
MCV: 98.8 fL (ref 78.0–100.0)
Monocytes Absolute: 0.5 10*3/uL (ref 0.1–1.0)
Monocytes Relative: 10 %
NEUTROS PCT: 54 %
Neutro Abs: 2.9 10*3/uL (ref 1.7–7.7)
Platelets: 141 10*3/uL — ABNORMAL LOW (ref 150–400)
RBC: 4.32 MIL/uL (ref 4.22–5.81)
RDW: 13.1 % (ref 11.5–15.5)
WBC: 5.4 10*3/uL (ref 4.0–10.5)

## 2018-06-09 LAB — RAPID URINE DRUG SCREEN, HOSP PERFORMED
Amphetamines: NOT DETECTED
Barbiturates: NOT DETECTED
Benzodiazepines: NOT DETECTED
Cocaine: POSITIVE — AB
Opiates: NOT DETECTED
Tetrahydrocannabinol: NOT DETECTED

## 2018-06-09 LAB — BASIC METABOLIC PANEL
Anion gap: 12 (ref 5–15)
BUN: <5 mg/dL — ABNORMAL LOW (ref 6–20)
CO2: 26 mmol/L (ref 22–32)
CREATININE: 0.8 mg/dL (ref 0.61–1.24)
Calcium: 9.8 mg/dL (ref 8.9–10.3)
Chloride: 100 mmol/L (ref 98–111)
GFR calc Af Amer: >60 mL/min (ref >60)
GFR calc non Af Amer: >60 mL/min (ref >60)
Glucose, Bld: 111 mg/dL — ABNORMAL HIGH (ref 70–99)
Potassium: 3.5 mmol/L (ref 3.5–5.1)
SODIUM: 138 mmol/L (ref 135–145)

## 2018-06-09 LAB — TSH: TSH: 1.923 u[IU]/mL (ref 0.350–4.500)

## 2018-06-09 LAB — ETHANOL: Alcohol, Ethyl (B): 234 mg/dL — ABNORMAL HIGH (ref <10)

## 2020-12-27 ENCOUNTER — Inpatient Hospital Stay (HOSPITAL_COMMUNITY)
Admission: EM | Admit: 2020-12-27 | Discharge: 2021-04-18 | DRG: 896 | Disposition: A | Discharge status: Nursing Home (Includes ICF) | Payer: Medicaid Other | Attending: Internal Medicine | Admitting: Internal Medicine

## 2020-12-27 ENCOUNTER — Other Ambulatory Visit: Payer: Self-pay

## 2020-12-27 ENCOUNTER — Emergency Department (HOSPITAL_COMMUNITY): Payer: Medicaid Other

## 2020-12-27 ENCOUNTER — Encounter (HOSPITAL_COMMUNITY): Payer: Self-pay | Admitting: Internal Medicine

## 2020-12-27 DIAGNOSIS — F17210 Nicotine dependence, cigarettes, uncomplicated: Secondary | ICD-10-CM | POA: Diagnosis present

## 2020-12-27 DIAGNOSIS — K7010 Alcoholic hepatitis without ascites: Secondary | ICD-10-CM | POA: Diagnosis present

## 2020-12-27 DIAGNOSIS — I10 Essential (primary) hypertension: Secondary | ICD-10-CM | POA: Diagnosis not present

## 2020-12-27 DIAGNOSIS — N179 Acute kidney failure, unspecified: Secondary | ICD-10-CM | POA: Diagnosis present

## 2020-12-27 DIAGNOSIS — F10939 Alcohol use, unspecified with withdrawal, unspecified: Secondary | ICD-10-CM | POA: Diagnosis present

## 2020-12-27 DIAGNOSIS — D7589 Other specified diseases of blood and blood-forming organs: Secondary | ICD-10-CM | POA: Diagnosis not present

## 2020-12-27 DIAGNOSIS — E872 Acidosis: Secondary | ICD-10-CM | POA: Diagnosis not present

## 2020-12-27 DIAGNOSIS — R269 Unspecified abnormalities of gait and mobility: Secondary | ICD-10-CM | POA: Diagnosis present

## 2020-12-27 DIAGNOSIS — F10239 Alcohol dependence with withdrawal, unspecified: Secondary | ICD-10-CM | POA: Diagnosis present

## 2020-12-27 DIAGNOSIS — R7989 Other specified abnormal findings of blood chemistry: Secondary | ICD-10-CM

## 2020-12-27 DIAGNOSIS — R410 Disorientation, unspecified: Secondary | ICD-10-CM

## 2020-12-27 DIAGNOSIS — K5900 Constipation, unspecified: Secondary | ICD-10-CM

## 2020-12-27 DIAGNOSIS — W19XXXA Unspecified fall, initial encounter: Secondary | ICD-10-CM

## 2020-12-27 DIAGNOSIS — R451 Restlessness and agitation: Secondary | ICD-10-CM | POA: Diagnosis not present

## 2020-12-27 DIAGNOSIS — Z597 Insufficient social insurance and welfare support: Secondary | ICD-10-CM

## 2020-12-27 DIAGNOSIS — Z8249 Family history of ischemic heart disease and other diseases of the circulatory system: Secondary | ICD-10-CM

## 2020-12-27 DIAGNOSIS — D538 Other specified nutritional anemias: Secondary | ICD-10-CM | POA: Diagnosis not present

## 2020-12-27 DIAGNOSIS — R54 Age-related physical debility: Secondary | ICD-10-CM | POA: Diagnosis present

## 2020-12-27 DIAGNOSIS — Z7289 Other problems related to lifestyle: Secondary | ICD-10-CM

## 2020-12-27 DIAGNOSIS — Y900 Blood alcohol level of less than 20 mg/100 ml: Secondary | ICD-10-CM | POA: Diagnosis present

## 2020-12-27 DIAGNOSIS — Z6822 Body mass index (BMI) 22.0-22.9, adult: Secondary | ICD-10-CM

## 2020-12-27 DIAGNOSIS — Z9181 History of falling: Secondary | ICD-10-CM

## 2020-12-27 DIAGNOSIS — Z681 Body mass index (BMI) 19 or less, adult: Secondary | ICD-10-CM

## 2020-12-27 DIAGNOSIS — E860 Dehydration: Secondary | ICD-10-CM | POA: Diagnosis present

## 2020-12-27 DIAGNOSIS — Z5900 Homelessness unspecified: Secondary | ICD-10-CM

## 2020-12-27 DIAGNOSIS — R627 Adult failure to thrive: Secondary | ICD-10-CM | POA: Diagnosis present

## 2020-12-27 DIAGNOSIS — R7401 Elevation of levels of liver transaminase levels: Secondary | ICD-10-CM

## 2020-12-27 DIAGNOSIS — G9341 Metabolic encephalopathy: Secondary | ICD-10-CM

## 2020-12-27 DIAGNOSIS — D72829 Elevated white blood cell count, unspecified: Secondary | ICD-10-CM

## 2020-12-27 DIAGNOSIS — G629 Polyneuropathy, unspecified: Secondary | ICD-10-CM | POA: Diagnosis present

## 2020-12-27 DIAGNOSIS — E43 Unspecified severe protein-calorie malnutrition: Secondary | ICD-10-CM | POA: Insufficient documentation

## 2020-12-27 DIAGNOSIS — R5381 Other malaise: Secondary | ICD-10-CM

## 2020-12-27 DIAGNOSIS — F17290 Nicotine dependence, other tobacco product, uncomplicated: Secondary | ICD-10-CM | POA: Diagnosis present

## 2020-12-27 DIAGNOSIS — Z20822 Contact with and (suspected) exposure to covid-19: Secondary | ICD-10-CM | POA: Diagnosis present

## 2020-12-27 DIAGNOSIS — H538 Other visual disturbances: Secondary | ICD-10-CM

## 2020-12-27 DIAGNOSIS — F1027 Alcohol dependence with alcohol-induced persisting dementia: Principal | ICD-10-CM | POA: Diagnosis present

## 2020-12-27 DIAGNOSIS — E538 Deficiency of other specified B group vitamins: Secondary | ICD-10-CM

## 2020-12-27 DIAGNOSIS — F1090 Alcohol use, unspecified, uncomplicated: Secondary | ICD-10-CM

## 2020-12-27 DIAGNOSIS — Z751 Person awaiting admission to adequate facility elsewhere: Secondary | ICD-10-CM

## 2020-12-27 DIAGNOSIS — E876 Hypokalemia: Secondary | ICD-10-CM

## 2020-12-27 HISTORY — DX: Other specified health status: Z78.9

## 2020-12-27 HISTORY — DX: Other problems related to lifestyle: Z72.89

## 2020-12-27 LAB — COMPREHENSIVE METABOLIC PANEL
ALT: 113 U/L — ABNORMAL HIGH (ref 0–44)
AST: 129 U/L — ABNORMAL HIGH (ref 15–41)
Albumin: 4.1 g/dL (ref 3.5–5.0)
Alkaline Phosphatase: 80 U/L (ref 38–126)
Anion gap: 14 (ref 5–15)
BUN: 64 mg/dL — ABNORMAL HIGH (ref 6–20)
CO2: 22 mmol/L (ref 22–32)
Calcium: 9.7 mg/dL (ref 8.9–10.3)
Chloride: 108 mmol/L (ref 98–111)
Creatinine, Ser: 1.83 mg/dL — ABNORMAL HIGH (ref 0.61–1.24)
GFR, Estimated: 42 mL/min — ABNORMAL LOW (ref >60)
Glucose, Bld: 136 mg/dL — ABNORMAL HIGH (ref 70–99)
Potassium: 3.6 mmol/L (ref 3.5–5.1)
Sodium: 144 mmol/L (ref 135–145)
Total Bilirubin: 1.4 mg/dL — ABNORMAL HIGH (ref 0.3–1.2)
Total Protein: 8.4 g/dL — ABNORMAL HIGH (ref 6.5–8.1)

## 2020-12-27 LAB — CBC WITH DIFFERENTIAL/PLATELET
Abs Immature Granulocytes: 0.07 10*3/uL (ref 0.00–0.07)
Basophils Absolute: 0.1 10*3/uL (ref 0.0–0.1)
Basophils Relative: 0 %
Eosinophils Absolute: 0 10*3/uL (ref 0.0–0.5)
Eosinophils Relative: 0 %
HCT: 48.4 % (ref 39.0–52.0)
Hemoglobin: 15.7 g/dL (ref 13.0–17.0)
Immature Granulocytes: 1 %
Lymphocytes Relative: 16 %
Lymphs Abs: 2 10*3/uL (ref 0.7–4.0)
MCH: 32.2 pg (ref 26.0–34.0)
MCHC: 32.4 g/dL (ref 30.0–36.0)
MCV: 99.4 fL (ref 80.0–100.0)
Monocytes Absolute: 1 10*3/uL (ref 0.1–1.0)
Monocytes Relative: 8 %
Neutro Abs: 9.3 10*3/uL — ABNORMAL HIGH (ref 1.7–7.7)
Neutrophils Relative %: 75 %
Platelets: 298 10*3/uL (ref 150–400)
RBC: 4.87 MIL/uL (ref 4.22–5.81)
RDW: 13.2 % (ref 11.5–15.5)
WBC: 12.4 10*3/uL — ABNORMAL HIGH (ref 4.0–10.5)
nRBC: 0 % (ref 0.0–0.2)

## 2020-12-27 LAB — TSH: TSH: 4.106 u[IU]/mL (ref 0.350–4.500)

## 2020-12-27 LAB — ETHANOL: Alcohol, Ethyl (B): <10 mg/dL (ref <10)

## 2020-12-27 LAB — MAGNESIUM: Magnesium: 2.8 mg/dL — ABNORMAL HIGH (ref 1.7–2.4)

## 2020-12-27 LAB — PHOSPHORUS: Phosphorus: 6.3 mg/dL — ABNORMAL HIGH (ref 2.5–4.6)

## 2020-12-27 MED ORDER — THIAMINE HCL 100 MG/ML IJ SOLN
100.0000 mg | Freq: Once | INTRAMUSCULAR | Status: AC
  Administered 2020-12-27: 100 mg via INTRAVENOUS
  Filled 2020-12-27: qty 2

## 2020-12-27 MED ORDER — ADULT MULTIVITAMIN W/MINERALS CH
1.0000 | ORAL_TABLET | Freq: Every day | ORAL | Status: DC
  Administered 2020-12-27 – 2021-04-18 (×113): 1 via ORAL
  Filled 2020-12-27 (×117): qty 1

## 2020-12-27 MED ORDER — THIAMINE HCL 100 MG PO TABS
100.0000 mg | ORAL_TABLET | Freq: Every day | ORAL | Status: DC
  Administered 2020-12-28 – 2021-01-23 (×25): 100 mg via ORAL
  Filled 2020-12-27 (×27): qty 1

## 2020-12-27 MED ORDER — SODIUM CHLORIDE 0.9 % IV BOLUS
1000.0000 mL | Freq: Once | INTRAVENOUS | Status: AC
  Administered 2020-12-27: 1000 mL via INTRAVENOUS

## 2020-12-27 MED ORDER — SODIUM CHLORIDE 0.9 % IV BOLUS
500.0000 mL | Freq: Once | INTRAVENOUS | Status: AC
  Administered 2020-12-27: 500 mL via INTRAVENOUS

## 2020-12-27 MED ORDER — ENOXAPARIN SODIUM 40 MG/0.4ML ~~LOC~~ SOLN
40.0000 mg | SUBCUTANEOUS | Status: DC
  Administered 2020-12-27 – 2021-04-03 (×98): 40 mg via SUBCUTANEOUS
  Filled 2020-12-27 (×99): qty 0.4

## 2020-12-27 MED ORDER — SODIUM CHLORIDE 0.9% FLUSH
3.0000 mL | Freq: Two times a day (BID) | INTRAVENOUS | Status: DC
  Administered 2020-12-27 – 2021-02-01 (×57): 3 mL via INTRAVENOUS

## 2020-12-27 MED ORDER — FOLIC ACID 1 MG PO TABS
1.0000 mg | ORAL_TABLET | Freq: Every day | ORAL | Status: DC
  Administered 2020-12-27 – 2021-04-18 (×113): 1 mg via ORAL
  Filled 2020-12-27 (×114): qty 1

## 2020-12-27 MED ORDER — SODIUM CHLORIDE 0.9 % IV SOLN: INTRAVENOUS | Status: AC

## 2020-12-27 MED ORDER — THIAMINE HCL 100 MG/ML IJ SOLN
100.0000 mg | Freq: Every day | INTRAMUSCULAR | Status: DC
  Administered 2020-12-30 – 2020-12-31 (×2): 100 mg via INTRAVENOUS
  Filled 2020-12-27 (×9): qty 2

## 2020-12-27 MED ORDER — POLYETHYLENE GLYCOL 3350 17 G PO PACK
17.0000 g | PACK | Freq: Every day | ORAL | Status: DC
  Administered 2021-01-03 – 2021-01-17 (×12): 17 g via ORAL
  Filled 2020-12-27 (×17): qty 1

## 2020-12-27 MED ORDER — HALOPERIDOL LACTATE 5 MG/ML IJ SOLN
1.0000 mg | Freq: Four times a day (QID) | INTRAMUSCULAR | Status: AC | PRN
Start: 2020-12-27 — End: 2020-12-31
  Administered 2020-12-30 – 2020-12-31 (×2): 1 mg via INTRAVENOUS
  Filled 2020-12-27 (×2): qty 1

## 2020-12-27 NOTE — ED Notes (Signed)
Pt ripped out second iv that was started and was sliding to edge of bed when RN entered room. Pt took all of monitoring devices off. Pt repositioned and male purewick remains on. Another IV was initiated. Hospitalist notified

## 2020-12-27 NOTE — ED Notes (Signed)
Bed alarm is on pt and working

## 2020-12-27 NOTE — ED Notes (Signed)
Pt unintentionally removed IV

## 2020-12-27 NOTE — ED Provider Notes (Signed)
[facility]-EMERGENCY DEPT Provider Note   CSN: [id number] Arrival date & time: 12/27/20  1603     History Chief Complaint  Patient presents with  . Altered Mental Status    Alvin Clark is a 60 y.o. male.  HPI   60 year old male presents to the emergency department today for evaluation of altered mental status  Per EMS report, [employer] was called out to the patient's prior residence as he was trying to get in a home that he was evicted from and the owners called the police.  A [employer] felt that the patient looked very frail and thought that he was disoriented so they called EMS.  EMS contacted the family who states that the patient is likely developing Alzheimer/dementia and he has a history of alcohol use.  Patient denies any recent fevers, chest pain, shortness of breath, abdominal pain, vomiting, diarrhea, urinary complaints, cough.  Patient's sister, Adair Laundry is at bedside.  She states that the patient has a history of intermittent confusion and chronic EtOH use.  She states that he seems to currently be at his mental baseline other than being a little bit more sleepy than normal.   No past medical history on file.  Patient Active Problem List   Diagnosis Date Noted  . AKI (acute kidney injury) (HCC) 12/27/2020  . Confusion 12/27/2020  . Leukocytosis 12/27/2020    No past surgical history on file.     No family history on file.  Social History   Tobacco Use  . Smoking status: Current Every Day Smoker    Packs/day: 1.00    Types: Cigars  . Smokeless tobacco: Never Used  Substance Use Topics  . Alcohol use: Yes    Alcohol/week: 21.0 standard drinks    Types: 14 Shots of liquor, 7 Cans of beer per week  . Drug use: No    Home Medications Prior to Admission medications   Medication Sig Start Date End Date Taking? Authorizing Provider  HYDROcodone-acetaminophen (NORCO/VICODIN) 5-325 MG per tablet Take 1-2 tablets by mouth every 6 (six) hours as  needed for moderate pain. Patient not taking: No sig reported 05/31/14   Cathren Laine, MD  ondansetron (ZOFRAN ODT) 8 MG disintegrating tablet Take 1 tablet (8 mg total) by mouth every 8 (eight) hours as needed for nausea or vomiting. Patient not taking: No sig reported 05/31/14   Cathren Laine, MD  pantoprazole (PROTONIX) 40 MG tablet Take 1 tablet (40 mg total) by mouth daily. Patient not taking: No sig reported 05/31/14   Cathren Laine, MD    Allergies    Patient has no known allergies.  Review of Systems   Review of Systems  Constitutional: Negative for chills and fever.  HENT: Negative for ear pain and sore throat.   Eyes: Negative for visual disturbance.  Respiratory: Negative for cough and shortness of breath.   Cardiovascular: Negative for chest pain.  Gastrointestinal: Negative for abdominal pain, constipation, diarrhea, nausea and vomiting.  Genitourinary: Negative for dysuria and hematuria.  Musculoskeletal: Negative for back pain.  Skin: Negative for rash.  Neurological: Negative for seizures and syncope.  All other systems reviewed and are negative.   Physical Exam Updated Vital Signs BP (!) 114/99   Pulse (!) 107   Temp 98.1 F (36.7 C) (Rectal)   Resp 15   Ht 5\' 9"  (1.753 m)   Wt 68 kg   SpO2 99%   BMI 22.15 kg/m   Physical Exam Vitals and nursing note reviewed.  Constitutional:      Appearance: He is well-developed and well-nourished.  HENT:     Head: Normocephalic and atraumatic.     Mouth/Throat:     Mouth: Mucous membranes are dry.  Eyes:     Extraocular Movements: Extraocular movements intact.     Conjunctiva/sclera: Conjunctivae normal.     Pupils: Pupils are equal, round, and reactive to light.  Cardiovascular:     Rate and Rhythm: Normal rate and regular rhythm.     Heart sounds: Normal heart sounds. No murmur heard.   Pulmonary:     Effort: Pulmonary effort is normal. No respiratory distress.     Breath sounds: Normal breath sounds. No  wheezing, rhonchi or rales.  Abdominal:     General: Bowel sounds are normal.     Palpations: Abdomen is soft.     Tenderness: There is no abdominal tenderness. There is no guarding or rebound.  Musculoskeletal:        General: No edema.     Cervical back: Neck supple.  Skin:    General: Skin is warm and dry.  Neurological:     Mental Status: He is alert.     Comments: Mental Status:  Alert, oriented to self, location, but not date or situation. Speech fluent without evidence of aphasia. Able to follow 2 step commands without difficulty.  Cranial Nerves:  II:  Peripheral visual fields grossly normal, pupils equal, round, reactive to light III,IV, VI: ptosis not present, extra-ocular motions intact bilaterally  V,VII: smile symmetric, facial light touch sensation equal VIII: hearing grossly normal to voice  X: uvula elevates symmetrically  XI: bilateral shoulder shrug symmetric and strong XII: midline tongue extension without fassiculations Motor:  Normal tone. 5/5 strength of BUE and BLE major muscle groups including strong and equal grip strength and dorsiflexion/plantar flexion Sensory: light touch normal in all extremities. Gait: normal gait and balance.    Psychiatric:        Mood and Affect: Mood and affect normal.     ED Results / Procedures / Treatments   Labs (all labs ordered are listed, but only abnormal results are displayed) Labs Reviewed  CBC WITH DIFFERENTIAL/PLATELET - Abnormal; Notable for the following components:      Result Value   WBC 12.4 (*)    Neutro Abs 9.3 (*)    All other components within normal limits  COMPREHENSIVE METABOLIC PANEL - Abnormal; Notable for the following components:   Glucose, Bld 136 (*)    BUN 64 (*)    Creatinine, Ser 1.83 (*)    Total Protein 8.4 (*)    AST 129 (*)    ALT 113 (*)    Total Bilirubin 1.4 (*)    GFR, Estimated 42 (*)    All other components within normal limits  ETHANOL  URINALYSIS, ROUTINE W REFLEX  MICROSCOPIC  RAPID URINE DRUG SCREEN, HOSP PERFORMED  HIV ANTIBODY (ROUTINE TESTING W REFLEX)  MAGNESIUM  PHOSPHORUS  TSH  COMPREHENSIVE METABOLIC PANEL    EKG None  Radiology CT Head Wo Contrast  Result Date: 12/27/2020 CLINICAL DATA:  Altered mental status and dizziness EXAM: CT HEAD WITHOUT CONTRAST TECHNIQUE: Contiguous axial images were obtained from the base of the skull through the vertex without intravenous contrast. COMPARISON:  None. FINDINGS: Brain: No evidence of acute infarction, hemorrhage, hydrocephalus, extra-axial collection or mass lesion/mass effect. Mild atrophic and chronic white matter ischemic changes are noted. Vascular: No hyperdense vessel or unexpected calcification. Skull: Normal. Negative for fracture  or focal lesion. Sinuses/Orbits: No acute finding. Other: None. IMPRESSION: Chronic atrophic and ischemic changes without acute abnormality. Electronically Signed   By: Alcide Clever M.D.   On: 12/27/2020 17:39   DG Chest Portable 1 View  Result Date: 12/27/2020 CLINICAL DATA:  Shortness of breath. EXAM: PORTABLE CHEST 1 VIEW COMPARISON:  None. FINDINGS: The heart size and mediastinal contours are within normal limits. Both lungs are clear. Chronic fifth, sixth and seventh right rib fractures are seen. IMPRESSION: No active disease. Electronically Signed   By: Aram Candela M.D.   On: 12/27/2020 17:52    Procedures Procedures   Medications Ordered in ED Medications  enoxaparin (LOVENOX) injection 40 mg (has no administration in time range)  sodium chloride flush (NS) 0.9 % injection 3 mL (has no administration in time range)  0.9 %  sodium chloride infusion (has no administration in time range)  sodium chloride 0.9 % bolus 1,000 mL (1,000 mLs Intravenous New Bag/Given 12/27/20 1946)  thiamine (B-1) injection 100 mg (100 mg Intravenous Given 12/27/20 1946)    ED Course  I have reviewed the triage vital signs and the nursing notes.  Pertinent labs & imaging  results that were available during my care of the patient were reviewed by me and considered in my medical decision making (see chart for details).    MDM Rules/Calculators/A&P                         60 year old male presents to the emergency department today for evaluation of altered mental status after he was found to be trespassing in his old residence.  Reviewed/interpreted labs CBC shows a mild leukocytosis, otherwise unremarkable CMP with elevated BUN/creatinine which is new from prior, LFTs are also elevated which appears chronic EtOH is negative UA, UDS are pending at the time of admission  EKG - with NSR, RAE, old probable anteroseptal infarct  CXR - No active disease CT head -  Chronic atrophic and ischemic changes without acute abnormality.  Pt with dehydration and malnutrition. Will admit for further tx.   8:05 PM CONSULT with Dr. Alinda Money who accepts patient for admission   Final Clinical Impression(s) / ED Diagnoses Final diagnoses:  Dehydration    Rx / DC Orders ED Discharge Orders    None       Rayne Du 12/27/20 2024    Charlynne Pander, MD 12/27/20 2253

## 2020-12-27 NOTE — ED Notes (Signed)
IV wrapped in coban and kerlex to prevent removal

## 2020-12-27 NOTE — H&P (Signed)
History and Physical   Alvin Clark FGH:[id number] DOB: 11/07/1960 DOA: 12/27/2020  PCP: Patient, No Pcp Per   Patient coming from: Transported by EMS  Chief Complaint: Confusion  HPI: Alvin Clark is a 60 y.o. male with medical history significant of alcohol use and confusion, otherwise unknown who presents via EMS with confusion.  History obtained with the assistance of chart review and family due to patient's history of confusion.  Per reports patient was transported by EMS after [facility] PD was called when the patient was noted to be trying to enter a home that he had been affected from.  The owners of the home were who called [facility].  On arrival, [employer] noted patient looks frail and disoriented and they contacted EMS for assistance.  EMS spoke with patient's family and they noted that they have concerns for developing dementia as he has had this confusion for 1 to 2 years and has tried to enter this home that he was evicted from in the past as well.  They also were the ones who noted he has a history of alcohol use.  Patient reports some constipation and blurriness in his vision.  Does appear to have okay vision as he can see the fingers I am holding up from across the room.  No pain in his eyes, no focal blurriness, it is bilateral.  Patient denies fevers, chest pain, shortness of breath, abdominal pain, nausea, vomiting, urinary symptoms.  ED Course: Vital signs in the ED significant for tachycardia in the 100s, blood pressure in the 90s to 1 teens systolic.  Lab work-up showed CMP with BUN 64, creatinine 1.83 from a baseline 0.8 a couple years ago.  Glucose 136.  Protein 8.4.  AST 129 and ALT 113 which are just mildly increased from his baseline labs in the past.  T bili 1.4.  CBC with mildly elevated white count at 12.4.  Ethanol level was negative.  Urinalysis and UDS pending.  CT of the head showed chronic atrophy and ischemic changes but no acute abnormalities.  Chest x-ray showed no  acute abnormalities.  Patient received 500 cc by EMS and a 1 L bolus in the ED he additionally was given thiamine in the ED.  Review of Systems: As per HPI otherwise all other systems reviewed and are negative.  Past Medical History:  Diagnosis Date  . Alcohol use    No past surgical history on file.  Social History  reports that he has been smoking cigars. He has been smoking about 1.00 pack per day. He has never used smokeless tobacco. He reports current alcohol use of about 21.0 standard drinks of alcohol per week. He reports current drug use. Frequency: 1.00 time per week. Drugs: Cocaine and Marijuana.  No Known Allergies  Family History  Problem Relation Age of Onset  . Cancer Mother   . Hypertension Father   . Heart disease Father   Reviewed on admission  Prior to Admission medications   Medication Sig Start Date End Date Taking? Authorizing Provider  HYDROcodone-acetaminophen (NORCO/VICODIN) 5-325 MG per tablet Take 1-2 tablets by mouth every 6 (six) hours as needed for moderate pain. Patient not taking: No sig reported 05/31/14   Cathren Laine, MD  ondansetron (ZOFRAN ODT) 8 MG disintegrating tablet Take 1 tablet (8 mg total) by mouth every 8 (eight) hours as needed for nausea or vomiting. Patient not taking: No sig reported 05/31/14   Cathren Laine, MD  pantoprazole (PROTONIX) 40 MG tablet Take  1 tablet (40 mg total) by mouth daily. Patient not taking: No sig reported 05/31/14   Cathren Laine, MD    Physical Exam: Vitals:   12/27/20 1830 12/27/20 1855 12/27/20 1900 12/27/20 1930  BP: (!) 111/99  91/73 (!) 114/99  Pulse: (!) 103 99 (!) 117 (!) 107  Resp: 13 15 17 15   Temp:      TempSrc:      SpO2: 98% 98% 98% 99%  Weight:      Height:       Physical Exam Constitutional:      General: He is not in acute distress.    Appearance: Normal appearance.     Comments: Thin, appears older than stated age  HENT:     Head: Normocephalic and atraumatic.     Mouth/Throat:      Mouth: Mucous membranes are dry.     Pharynx: Oropharynx is clear.  Eyes:     Extraocular Movements: Extraocular movements intact.     Pupils: Pupils are equal, round, and reactive to light.  Cardiovascular:     Rate and Rhythm: Regular rhythm. Tachycardia present.     Pulses: Normal pulses.     Heart sounds: Normal heart sounds.  Pulmonary:     Effort: Pulmonary effort is normal. No respiratory distress.     Breath sounds: Normal breath sounds.  Abdominal:     General: Bowel sounds are normal. There is no distension.     Palpations: Abdomen is soft.     Tenderness: There is no abdominal tenderness.  Musculoskeletal:        General: No swelling or deformity.  Skin:    General: Skin is warm and dry.  Neurological:     Mental Status: Mental status is at baseline.     Comments: At baseline per family report moves all 4 extremities spontaneously.    Labs on Admission: I have personally reviewed following labs and imaging studies  CBC: Recent Labs  Lab 12/27/20 1634  WBC 12.4*  NEUTROABS 9.3*  HGB 15.7  HCT 48.4  MCV 99.4  PLT 298    Basic Metabolic Panel: Recent Labs  Lab 12/27/20 1634  NA 144  K 3.6  CL 108  CO2 22  GLUCOSE 136*  BUN 64*  CREATININE 1.83*  CALCIUM 9.7    GFR: Estimated Creatinine Clearance: 42.3 mL/min (A) (by C-G formula based on SCr of 1.83 mg/dL (H)).  Liver Function Tests: Recent Labs  Lab 12/27/20 1634  AST 129*  ALT 113*  ALKPHOS 80  BILITOT 1.4*  PROT 8.4*  ALBUMIN 4.1    Urine analysis:    Component Value Date/Time   COLORURINE AMBER (A) 05/31/2014 1841   APPEARANCEUR CLEAR 05/31/2014 1841   LABSPEC 1.030 05/31/2014 1841   PHURINE 6.5 05/31/2014 1841   GLUCOSEU NEGATIVE 05/31/2014 1841   HGBUR NEGATIVE 05/31/2014 1841   BILIRUBINUR SMALL (A) 05/31/2014 1841   KETONESUR >80 (A) 05/31/2014 1841   PROTEINUR 30 (A) 05/31/2014 1841   UROBILINOGEN 1.0 05/31/2014 1841   NITRITE NEGATIVE 05/31/2014 1841   LEUKOCYTESUR  NEGATIVE 05/31/2014 1841    Radiological Exams on Admission: CT Head Wo Contrast  Result Date: 12/27/2020 CLINICAL DATA:  Altered mental status and dizziness EXAM: CT HEAD WITHOUT CONTRAST TECHNIQUE: Contiguous axial images were obtained from the base of the skull through the vertex without intravenous contrast. COMPARISON:  None. FINDINGS: Brain: No evidence of acute infarction, hemorrhage, hydrocephalus, extra-axial collection or mass lesion/mass effect. Mild atrophic and chronic white  matter ischemic changes are noted. Vascular: No hyperdense vessel or unexpected calcification. Skull: Normal. Negative for fracture or focal lesion. Sinuses/Orbits: No acute finding. Other: None. IMPRESSION: Chronic atrophic and ischemic changes without acute abnormality. Electronically Signed   By: Alcide CleverMark  Lukens M.D.   On: 12/27/2020 17:39   DG Chest Portable 1 View  Result Date: 12/27/2020 CLINICAL DATA:  Shortness of breath. EXAM: PORTABLE CHEST 1 VIEW COMPARISON:  None. FINDINGS: The heart size and mediastinal contours are within normal limits. Both lungs are clear. Chronic fifth, sixth and seventh right rib fractures are seen. IMPRESSION: No active disease. Electronically Signed   By: Aram Candelahaddeus  Houston M.D.   On: 12/27/2020 17:52   EKG: Independently reviewed.  Sinus rhythm at 90 bpm.    Assessment/Plan Principal Problem:   AKI (acute kidney injury) (HCC) Active Problems:   Confusion   Leukocytosis   Blurry vision, bilateral   Transaminitis   Constipation  AKI > Creatinine elevated to 1.83 from previous known baseline of 0.8.  And patient appears dry on exam. > Has received 1.5 L in the ED and EMS thus far. > Give additional 500 cc bolus and started on a rate of 125 cc/h. > Suspect prerenal given BUN elevation - Continue IV fluids as above - Trend renal function and electrolytes  Confusion > Patient with a history of ongoing confusion for 1 to 2 years this does appear to be his baseline, unknown  etiology that he has a history of alcohol use and he does have atrophy and ischemic changes on CT head. > No focal deficits on exam. - Alcohol level was normal - Add on Mg Phos and TSH for completeness - Follow-up UDS  Blurry vision > states he has some blurriness in his vision in all fields of vision in both eyes since being in the ED that seems to come and go little bit. > Able to determine the number of fingers I am holding up from across the room. > Denies any pain in either eye nor focal vision changes. > Low suspicion for stroke due to lack of focalization.  May benefit from eye exam if persists.  If worsens or becomes focal or painful may need further imaging.  Leukocytosis > May be related to dehydration/AKI above due to hemoconcentration versus reactive leukocytosis. > Chest x-ray without acute abnormality, denies any fevers or symptoms otherwise. - We will follow up urinalysis for completeness  Alcohol use Transaminitis > Reports daily alcohol use states he very rarely stops for an extended period time and does have shakes at least when he does stop. > Last drink was yesterday and ethanol levels negative in the ED. > AST 129 and ALT 113 this is just mildly elevated above what it was as previous lab check - We will monitor with CIWA, without Ativan and telemetry - Thiamine, folate, multivitamin - Add on mag and Phos, trend CMP  Constipation > Reports feeling constipated or "clogged up " - Daily MiraLAX  DVT prophylaxis: Lovenox Code Status:   Full Family Communication:  Sister updated at bedside Disposition Plan:   Patient is from:  Home  Anticipated DC to:  Home  Anticipated DC date:  1 to 2 days  Anticipated DC barriers: None  Consults called:  None Admission status:  Observation, MedSurg  Severity of Illness: The appropriate patient status for this patient is OBSERVATION. Observation status is judged to be reasonable and necessary in order to provide the required  intensity of service to ensure  the patient's safety. The patient's presenting symptoms, physical exam findings, and initial radiographic and laboratory data in the context of their medical condition is felt to place them at decreased risk for further clinical deterioration. Furthermore, it is anticipated that the patient will be medically stable for discharge from the hospital within 2 midnights of admission. The following factors support the patient status of observation.   " The patient's presenting symptoms include confusion, tachycardia. " The physical exam findings include tachycardia. " The initial radiographic and laboratory data are creatinine 1.83 from baseline 0.8.  Leukocytosis of 12.4, elevated AST and ALT.  T bili 1.4.  CT with chronic atrophy and ischemic changes.Synetta Fail MD [facility]  How to contact the [facility] Attending or Consulting provider 7A - 7P or covering provider during after hours 7P -7A, for this patient?   1. Check the care team in [facility] and look for a) attending/consulting [facility] provider listed and b) the [facility] team listed 2. Log into [email or web address] and use [place]'s universal password to access. If you do not have the password, please contact the hospital operator. 3. Locate the [facility] provider you are looking for under [facility] and page to a number that you can be directly reached. 4. If you still have difficulty reaching the provider, please page the [facility] (Director on Call) for the Hospitalists listed on amion for assistance.  12/27/2020, 8:43 PM

## 2020-12-27 NOTE — ED Notes (Signed)
Pt gave permission for writer to give his wallet to his sister Chinedu Agustin [phone], she has the wallet in her possession.

## 2020-12-28 ENCOUNTER — Observation Stay (HOSPITAL_COMMUNITY): Payer: Medicaid Other

## 2020-12-28 DIAGNOSIS — E43 Unspecified severe protein-calorie malnutrition: Secondary | ICD-10-CM | POA: Diagnosis present

## 2020-12-28 DIAGNOSIS — F10239 Alcohol dependence with withdrawal, unspecified: Secondary | ICD-10-CM | POA: Diagnosis present

## 2020-12-28 DIAGNOSIS — Z597 Insufficient social insurance and welfare support: Secondary | ICD-10-CM | POA: Diagnosis not present

## 2020-12-28 DIAGNOSIS — D72829 Elevated white blood cell count, unspecified: Secondary | ICD-10-CM | POA: Diagnosis present

## 2020-12-28 DIAGNOSIS — H538 Other visual disturbances: Secondary | ICD-10-CM | POA: Diagnosis present

## 2020-12-28 DIAGNOSIS — Z5900 Homelessness unspecified: Secondary | ICD-10-CM | POA: Diagnosis not present

## 2020-12-28 DIAGNOSIS — Z6822 Body mass index (BMI) 22.0-22.9, adult: Secondary | ICD-10-CM | POA: Diagnosis not present

## 2020-12-28 DIAGNOSIS — D538 Other specified nutritional anemias: Secondary | ICD-10-CM | POA: Diagnosis not present

## 2020-12-28 DIAGNOSIS — F1027 Alcohol dependence with alcohol-induced persisting dementia: Secondary | ICD-10-CM | POA: Diagnosis present

## 2020-12-28 DIAGNOSIS — Z20822 Contact with and (suspected) exposure to covid-19: Secondary | ICD-10-CM | POA: Diagnosis present

## 2020-12-28 DIAGNOSIS — Y900 Blood alcohol level of less than 20 mg/100 ml: Secondary | ICD-10-CM | POA: Diagnosis present

## 2020-12-28 DIAGNOSIS — Z681 Body mass index (BMI) 19 or less, adult: Secondary | ICD-10-CM | POA: Diagnosis not present

## 2020-12-28 DIAGNOSIS — Z751 Person awaiting admission to adequate facility elsewhere: Secondary | ICD-10-CM | POA: Diagnosis not present

## 2020-12-28 DIAGNOSIS — K5900 Constipation, unspecified: Secondary | ICD-10-CM | POA: Diagnosis present

## 2020-12-28 DIAGNOSIS — E876 Hypokalemia: Secondary | ICD-10-CM | POA: Diagnosis not present

## 2020-12-28 DIAGNOSIS — F10939 Alcohol use, unspecified with withdrawal, unspecified: Secondary | ICD-10-CM | POA: Diagnosis present

## 2020-12-28 DIAGNOSIS — E860 Dehydration: Secondary | ICD-10-CM | POA: Diagnosis present

## 2020-12-28 DIAGNOSIS — E872 Acidosis: Secondary | ICD-10-CM | POA: Diagnosis not present

## 2020-12-28 DIAGNOSIS — R269 Unspecified abnormalities of gait and mobility: Secondary | ICD-10-CM | POA: Diagnosis present

## 2020-12-28 DIAGNOSIS — F17290 Nicotine dependence, other tobacco product, uncomplicated: Secondary | ICD-10-CM | POA: Diagnosis present

## 2020-12-28 DIAGNOSIS — K7010 Alcoholic hepatitis without ascites: Secondary | ICD-10-CM | POA: Diagnosis present

## 2020-12-28 DIAGNOSIS — F17210 Nicotine dependence, cigarettes, uncomplicated: Secondary | ICD-10-CM | POA: Diagnosis present

## 2020-12-28 DIAGNOSIS — I10 Essential (primary) hypertension: Secondary | ICD-10-CM | POA: Diagnosis not present

## 2020-12-28 DIAGNOSIS — R4182 Altered mental status, unspecified: Secondary | ICD-10-CM | POA: Diagnosis present

## 2020-12-28 DIAGNOSIS — R54 Age-related physical debility: Secondary | ICD-10-CM | POA: Diagnosis present

## 2020-12-28 DIAGNOSIS — N179 Acute kidney failure, unspecified: Secondary | ICD-10-CM | POA: Diagnosis present

## 2020-12-28 LAB — CBC
HCT: 48.3 % (ref 39.0–52.0)
Hemoglobin: 15.7 g/dL (ref 13.0–17.0)
MCH: 32.6 pg (ref 26.0–34.0)
MCHC: 32.5 g/dL (ref 30.0–36.0)
MCV: 100.2 fL — ABNORMAL HIGH (ref 80.0–100.0)
Platelets: 277 10*3/uL (ref 150–400)
RBC: 4.82 MIL/uL (ref 4.22–5.81)
RDW: 13.2 % (ref 11.5–15.5)
WBC: 12.7 10*3/uL — ABNORMAL HIGH (ref 4.0–10.5)
nRBC: 0.2 % (ref 0.0–0.2)

## 2020-12-28 LAB — COMPREHENSIVE METABOLIC PANEL
ALT: 89 U/L — ABNORMAL HIGH (ref 0–44)
ALT: 94 U/L — ABNORMAL HIGH (ref 0–44)
AST: 97 U/L — ABNORMAL HIGH (ref 15–41)
AST: 103 U/L — ABNORMAL HIGH (ref 15–41)
Albumin: 3.7 g/dL (ref 3.5–5.0)
Albumin: 3.8 g/dL (ref 3.5–5.0)
Alkaline Phosphatase: 69 U/L (ref 38–126)
Alkaline Phosphatase: 71 U/L (ref 38–126)
Anion gap: 9 (ref 5–15)
Anion gap: 12 (ref 5–15)
BUN: 49 mg/dL — ABNORMAL HIGH (ref 6–20)
BUN: 43 mg/dL — ABNORMAL HIGH (ref 6–20)
CO2: 25 mmol/L (ref 22–32)
CO2: 23 mmol/L (ref 22–32)
Calcium: 8.9 mg/dL (ref 8.9–10.3)
Calcium: 9.6 mg/dL (ref 8.9–10.3)
Chloride: 111 mmol/L (ref 98–111)
Chloride: 111 mmol/L (ref 98–111)
Creatinine, Ser: 1.35 mg/dL — ABNORMAL HIGH (ref 0.61–1.24)
Creatinine, Ser: 1.29 mg/dL — ABNORMAL HIGH (ref 0.61–1.24)
GFR, Estimated: >60 mL/min (ref >60)
GFR, Estimated: >60 mL/min (ref >60)
Glucose, Bld: 100 mg/dL — ABNORMAL HIGH (ref 70–99)
Glucose, Bld: 133 mg/dL — ABNORMAL HIGH (ref 70–99)
Potassium: 3.4 mmol/L — ABNORMAL LOW (ref 3.5–5.1)
Potassium: 3.5 mmol/L (ref 3.5–5.1)
Sodium: 145 mmol/L (ref 135–145)
Sodium: 146 mmol/L — ABNORMAL HIGH (ref 135–145)
Total Bilirubin: 1.3 mg/dL — ABNORMAL HIGH (ref 0.3–1.2)
Total Bilirubin: 1.4 mg/dL — ABNORMAL HIGH (ref 0.3–1.2)
Total Protein: 7.2 g/dL (ref 6.5–8.1)
Total Protein: 7.6 g/dL (ref 6.5–8.1)

## 2020-12-28 LAB — URINALYSIS, ROUTINE W REFLEX MICROSCOPIC
Bilirubin Urine: NEGATIVE
Glucose, UA: NEGATIVE mg/dL
Hgb urine dipstick: NEGATIVE
Ketones, ur: NEGATIVE mg/dL
Leukocytes,Ua: NEGATIVE
Nitrite: NEGATIVE
Protein, ur: NEGATIVE mg/dL
Specific Gravity, Urine: 1.025 (ref 1.005–1.030)
pH: 5 (ref 5.0–8.0)

## 2020-12-28 LAB — RESP PANEL BY RT-PCR (FLU A&B, COVID) ARPGX2
Influenza A by PCR: NEGATIVE
Influenza B by PCR: NEGATIVE
SARS Coronavirus 2 by RT PCR: NEGATIVE

## 2020-12-28 LAB — RAPID URINE DRUG SCREEN, HOSP PERFORMED
Amphetamines: NOT DETECTED
Barbiturates: NOT DETECTED
Benzodiazepines: NOT DETECTED
Cocaine: NOT DETECTED
Opiates: NOT DETECTED
Tetrahydrocannabinol: NOT DETECTED

## 2020-12-28 LAB — MAGNESIUM: Magnesium: 2.4 mg/dL (ref 1.7–2.4)

## 2020-12-28 LAB — PHOSPHORUS: Phosphorus: 2.9 mg/dL (ref 2.5–4.6)

## 2020-12-28 LAB — HIV ANTIBODY (ROUTINE TESTING W REFLEX): HIV Screen 4th Generation wRfx: NONREACTIVE

## 2020-12-28 MED ORDER — CHLORDIAZEPOXIDE HCL 25 MG PO CAPS
25.0000 mg | ORAL_CAPSULE | Freq: Four times a day (QID) | ORAL | Status: DC
  Administered 2020-12-28 – 2020-12-29 (×4): 25 mg via ORAL
  Filled 2020-12-28 (×5): qty 1

## 2020-12-28 MED ORDER — LORAZEPAM 2 MG/ML IJ SOLN
1.0000 mg | INTRAMUSCULAR | Status: AC | PRN
  Administered 2020-12-28: 2 mg via INTRAVENOUS
  Administered 2020-12-29 – 2020-12-30 (×3): 1 mg via INTRAVENOUS
  Administered 2020-12-31: 2 mg via INTRAVENOUS
  Filled 2020-12-28 (×6): qty 1

## 2020-12-28 MED ORDER — LORAZEPAM 1 MG PO TABS
1.0000 mg | ORAL_TABLET | ORAL | Status: AC | PRN
  Administered 2020-12-30: 2 mg via ORAL
  Administered 2020-12-31: 1 mg via ORAL
  Filled 2020-12-28: qty 2

## 2020-12-28 NOTE — ED Notes (Signed)
Pt resting in bed at this time. 

## 2020-12-28 NOTE — Progress Notes (Signed)
PROGRESS NOTE  Alvin Clark  DOB: [birth year]  PCP: Patient, No Pcp Per [facility]:[id number]  DOA: 12/27/2020  LOS: 0 days   Chief Complaint  Patient presents with  . Altered Mental Status   Brief narrative: Alvin Clark is a 60 y.o. male with PMH significant for chronic alcoholism.   3/9, patient was trying to enter the home that he was evicted from.  Homeowners called [place] PD found him frail and disoriented.  EMS was called in.  EMS called patient's family who stated that patient had concerns for chronic alcoholism and developing memory issues for last 1 to 2 years.   In the ED, patient was afebrile, slightly tachycardic, blood pressure stable. Labs showed BUN/creatinine 64/1.83, from a baseline of normal 3 years ago.  AST/ALT elevated to 129/113, WC count slightly elevated 12.4. Blood alcohol level was negative.  Urine drug screen was negative urinalysis showed hazy yellow urine. CT scan of the head showed generalized volume loss advanced for his age, without acute abnormality. CT cervical spine showed advanced cervical spine degeneration with suspected multilevel mild spinal stenosis. Chest x-ray unremarkable. Patient was kept in observation under hospitalist service.  While in the ED, patient has remained altered.  He also fell out of bed this morning without obvious injuries.  Subjective: Patient was seen and examined this morning. Middle-aged African-American male.  Not well-kept. Sleepy this morning.  Opens eyes on verbal command.  Knows he is in [place].  Not oriented otherwise.  Falls right back asleep Chart reviewed.  Hemodynamically stable Labs from this morning with creatinine improvement to 1.35  Assessment/Plan: Acute metabolic encephalopathy Chronic evolving memory issues -Patient has chronic alcoholism.  Family reports worsening memory issues for last 1 to 2 years.  CT scan of head showed generalized volume loss advanced for his age. -Brought in for disorientation,  abnormal behavior.  Dehydration might have precipitated an acute delirium and disorientation.  Blood alcohol level negative.  UDS normal. -Earlier this morning, he tried to get out of bed and fell with no obvious injury.  He received a dose of Ativan and remained sleepy since then. -Continue to monitor mental status change with hydration.  AKI -Baseline creatinine less than 1. -Creatinine elevated to 1.83 on admission.  Improved to 1.35 with hydration. -Continue to monitor Recent Labs    12/27/20 1634 12/28/20 0614  BUN 64* 49*  CREATININE 1.83* 1.35*   Chronic alcoholism -Reports daily alcohol use.  Last drink was 1 day prior to presentation. -Currently on CIWA protocol with IV Ativan as needed. -Continue to monitor electrolytes Recent Labs  Lab 12/27/20 1634 12/27/20 1700 12/28/20 0614  K 3.6  --  3.4*  MG  --  2.8*  --   PHOS  --  6.3*  --    Transaminitis  -Probably due to chronic liver disease with alcoholism.  Trend liver enzymes. Recent Labs  Lab 12/27/20 1634 12/28/20 0614  AST 129* 97*  ALT 113* 89*  ALKPHOS 80 69  BILITOT 1.4* 1.3*  PROT 8.4* 7.2  ALBUMIN 4.1 3.7   Leukocytosis -Probably due to dehydration.  Continue to monitor. Recent Labs  Lab 12/27/20 1634  WBC 12.4*   Constipation -Senokot, MiraLAX as needed  Mobility: Encourage ambulation.  Needs PT eval.  Had falls this morning. Code Status:   Code Status: Full Code  Nutritional status: Body mass index is 22.15 kg/m.     Diet Order            Diet  heart healthy/carb modified Room service appropriate? Yes; Fluid consistency: Thin  Diet effective now                 DVT prophylaxis: enoxaparin (LOVENOX) injection 40 mg Start: 12/27/20 2200   Antimicrobials:  None Fluid: Normal saline at 125 mill per hour Consultants: None Family Communication:  None at bedside  Status is: Observation  Dispo: The patient is from: Home              Anticipated d/c is to: Home, pending PT  eval              Patient currently is not medically stable to d/c.   Difficult to place patient No       Infusions:  . sodium chloride 125 mL/hr at 12/28/20 0134    Scheduled Meds: . enoxaparin (LOVENOX) injection  40 mg Subcutaneous Q24H  . folic acid  1 mg Oral Daily  . multivitamin with minerals  1 tablet Oral Daily  . polyethylene glycol  17 g Oral Daily  . sodium chloride flush  3 mL Intravenous Q12H  . thiamine  100 mg Oral Daily   Or  . thiamine  100 mg Intravenous Daily    Antimicrobials: Anti-infectives (From admission, onward)   None      PRN meds: haloperidol lactate   Objective: Vitals:   12/28/20 0700 12/28/20 0730  BP: (!) 125/98 105/70  Pulse: 75 64  Resp: 17 15  Temp:  98 F (36.7 C)  SpO2: 97% 98%    Intake/Output Summary (Last 24 hours) at 12/28/2020 0943 Last data filed at 12/27/2020 2303 Gross per 24 hour  Intake 1500 ml  Output --  Net 1500 ml   Filed Weights   12/27/20 1656  Weight: 68 kg   Weight change:  Body mass index is 22.15 kg/m.   Physical Exam: General exam: Middle-aged African-American male, not in physical distress Skin: No rashes, lesions or ulcers. HEENT: Atraumatic, normocephalic, no obvious bleeding Lungs: Clear to auscultation bilaterally CVS: Regular rate and rhythm, no murmur GI/Abd soft, nontender, nondistended, bowel sound present CNS: Sleepy, opens eyes on verbal command, knows he is in [place].  Not oriented otherwise Psychiatry: Mood appropriate Extremities: No pedal edema, no calf tenderness  Data Review: I have personally reviewed the laboratory data and studies available.  Recent Labs  Lab 12/27/20 1634  WBC 12.4*  NEUTROABS 9.3*  HGB 15.7  HCT 48.4  MCV 99.4  PLT 298   Recent Labs  Lab 12/27/20 1634 12/27/20 1700 12/28/20 0614  NA 144  --  145  K 3.6  --  3.4*  CL 108  --  111  CO2 22  --  25  GLUCOSE 136*  --  100*  BUN 64*  --  49*  CREATININE 1.83*  --  1.35*  CALCIUM 9.7   --  8.9  MG  --  2.8*  --   PHOS  --  6.3*  --     F/u labs ordered Unresulted Labs (From admission, onward)          Start     Ordered   01/03/21 0500  Creatinine, serum  (enoxaparin (LOVENOX)    CrCl >/= 30 ml/min)  Weekly,   R     Comments: while on enoxaparin therapy    12/27/20 2008   12/29/20 0500  CBC with Differential/Platelet  Daily,   R      12/28/20 0943   12/29/20 0500  Basic metabolic panel  Daily,   R      12/28/20 0943   12/29/20 0500  Magnesium  Tomorrow morning,   STAT        12/28/20 0943   12/29/20 0500  Phosphorus  Tomorrow morning,   R        12/28/20 0943          Signed, Lorin Glass, MD [facility] 12/28/2020

## 2020-12-28 NOTE — ED Notes (Signed)
Upon entering pt room, bed alarm was going off and pt was on the floor sitting. Pt denies pain, but is also confused at baseline and does not communicate well. Dr. Nicanor Alcon notified and orders placed for CT

## 2020-12-28 NOTE — ED Notes (Addendum)
IV fluid pump alarm heard on assessment NSS infusion complete so I went to get more fluids upon my return Alvin Clark was found on the floor. Unwitness  Fall  Suspected. On examination Alvin Clark denies injury or pain and with minor assist was able to ambulate back to bed. EDP Palumbo notified and reassessment begun. When asked why he was getting OOB, Alvin Clark stated he had to pee. Male peer wick was inplace at the time of this event.

## 2020-12-28 NOTE — ED Notes (Signed)
Per staff bed placement pulled patient back

## 2020-12-28 NOTE — ED Notes (Signed)
Called Boneta Lucks on [address] and asked to update purple man to current Nurse that will be receiving report.

## 2020-12-28 NOTE — ED Notes (Signed)
Secure chat to nurse Britta Mccreedy cates

## 2020-12-28 NOTE — ED Notes (Signed)
Secure message sent to Nurse 

## 2020-12-28 NOTE — ED Provider Notes (Signed)
Asked to see patient as he reportedly fell out of the bed  Patient is awake NCAT B TM intact Nose normal RRR CTAB NABS Pelvis is stable   Plan repeat CT head and C spine    Esmirna Ravan, MD 12/28/20 0037

## 2020-12-29 DIAGNOSIS — E43 Unspecified severe protein-calorie malnutrition: Secondary | ICD-10-CM | POA: Insufficient documentation

## 2020-12-29 LAB — BASIC METABOLIC PANEL
Anion gap: 8 (ref 5–15)
BUN: 27 mg/dL — ABNORMAL HIGH (ref 6–20)
CO2: 23 mmol/L (ref 22–32)
Calcium: 8.7 mg/dL — ABNORMAL LOW (ref 8.9–10.3)
Chloride: 113 mmol/L — ABNORMAL HIGH (ref 98–111)
Creatinine, Ser: 1.12 mg/dL (ref 0.61–1.24)
GFR, Estimated: >60 mL/min (ref >60)
Glucose, Bld: 129 mg/dL — ABNORMAL HIGH (ref 70–99)
Potassium: 3.7 mmol/L (ref 3.5–5.1)
Sodium: 144 mmol/L (ref 135–145)

## 2020-12-29 LAB — CBC WITH DIFFERENTIAL/PLATELET
Abs Immature Granulocytes: 0.03 10*3/uL (ref 0.00–0.07)
Basophils Absolute: 0.1 10*3/uL (ref 0.0–0.1)
Basophils Relative: 1 %
Eosinophils Absolute: 0.1 10*3/uL (ref 0.0–0.5)
Eosinophils Relative: 1 %
HCT: 36.9 % — ABNORMAL LOW (ref 39.0–52.0)
Hemoglobin: 11.9 g/dL — ABNORMAL LOW (ref 13.0–17.0)
Immature Granulocytes: 0 %
Lymphocytes Relative: 25 %
Lymphs Abs: 2.3 10*3/uL (ref 0.7–4.0)
MCH: 32.7 pg (ref 26.0–34.0)
MCHC: 32.2 g/dL (ref 30.0–36.0)
MCV: 101.4 fL — ABNORMAL HIGH (ref 80.0–100.0)
Monocytes Absolute: 0.8 10*3/uL (ref 0.1–1.0)
Monocytes Relative: 9 %
Neutro Abs: 5.8 10*3/uL (ref 1.7–7.7)
Neutrophils Relative %: 64 %
Platelets: 213 10*3/uL (ref 150–400)
RBC: 3.64 MIL/uL — ABNORMAL LOW (ref 4.22–5.81)
RDW: 13.5 % (ref 11.5–15.5)
WBC: 9 10*3/uL (ref 4.0–10.5)
nRBC: 0 % (ref 0.0–0.2)

## 2020-12-29 LAB — PHOSPHORUS: Phosphorus: 2.4 mg/dL — ABNORMAL LOW (ref 2.5–4.6)

## 2020-12-29 LAB — MAGNESIUM: Magnesium: 2.4 mg/dL (ref 1.7–2.4)

## 2020-12-29 MED ORDER — SODIUM CHLORIDE 0.9 % IV SOLN: INTRAVENOUS | Status: DC

## 2020-12-29 MED ORDER — POTASSIUM PHOSPHATES 15 MMOLE/5ML IV SOLN
30.0000 mmol | Freq: Once | INTRAVENOUS | Status: AC
  Administered 2020-12-29: 30 mmol via INTRAVENOUS
  Filled 2020-12-29: qty 10

## 2020-12-29 MED ORDER — ENSURE ENLIVE PO LIQD
237.0000 mL | Freq: Three times a day (TID) | ORAL | Status: DC
  Administered 2020-12-30 – 2021-04-18 (×309): 237 mL via ORAL

## 2020-12-29 NOTE — Plan of Care (Signed)

## 2020-12-29 NOTE — Progress Notes (Signed)
Initial Nutrition Assessment  DOCUMENTATION CODES:   Severe malnutrition in context of chronic illness,Underweight  INTERVENTION:   Monitor magnesium, potassium, and phosphorus daily for at least 3 days, MD to replete as needed, as pt is at risk for refeeding syndrome.  -Ensure Enlive po TID, each supplement provides 350 kcal and 20 grams of protein  -Multivitamin with minerals daily  NUTRITION DIAGNOSIS:   Severe Malnutrition related to chronic illness (chronic alcoholism) as evidenced by severe fat depletion,severe muscle depletion.  GOAL:   Patient will meet greater than or equal to 90% of their needs  MONITOR:   PO intake,Supplement acceptance,Labs,Weight trends,I & O's  REASON FOR ASSESSMENT:   Malnutrition Screening Tool    ASSESSMENT:   60 y.o. male with PMH significant for chronic alcoholism.    3/9, patient was trying to enter the home that he was evicted from.  Homeowners called [place] PD found him frail and disoriented.  EMS was called in. EMS called patient's family who stated that patient had concerns for chronic alcoholism and developing memory issues for last 1 to 2 years. Admitted for acute metabolic encephalopathy.  Patient in room, would not waken. Per RN outside of room, pt was agitated and kept getting out of bed. He was given ativan.  Food tray sitting at bedside untouched. Unable to know if pt ate anything today.  Per chart review, pt with history of chronic EtOH abuse.  Will order Ensure supplements for additional kcals and protein. May be at refeeding risk.  No weights since 2019 in weight records to assess current weight status.  Medications: Folic acid, Multivitamin with minerals daily, Miralax, Thiamine, K-Phos  Labs reviewed: Low Phos  NUTRITION - FOCUSED PHYSICAL EXAM:  Flowsheet Row Most Recent Value  Orbital Region Severe depletion  Upper Arm Region Severe depletion  Thoracic and Lumbar Region Unable to assess  Buccal Region  Moderate depletion  Temple Region Severe depletion  Clavicle Bone Region Severe depletion  Clavicle and Acromion Bone Region Severe depletion  Scapular Bone Region Severe depletion  Dorsal Hand Unable to assess  Patellar Region Unable to assess  Anterior Thigh Region Unable to assess  Posterior Calf Region Unable to assess  Edema (RD Assessment) None       Diet Order:   Diet Order            Diet heart healthy/carb modified Room service appropriate? Yes; Fluid consistency: Thin  Diet effective now                 EDUCATION NEEDS:   No education needs have been identified at this time  Skin:  Skin Assessment: Reviewed RN Assessment  Last BM:  3/11 -type 6  Height:   Ht Readings from Last 1 Encounters:  12/27/20 5\' 9"  (1.753 m)    Weight:   Wt Readings from Last 1 Encounters:  12/29/20 55.8 kg   BMI:  Body mass index is 18.17 kg/m.  Estimated Nutritional Needs:   Kcal:  2150-2350  Protein:  85-105g  Fluid:  2.1L/day  09-28-1990, MS, RD, LDN Inpatient Clinical Dietitian Contact information available via Amion

## 2020-12-29 NOTE — Progress Notes (Signed)
PT Cancellation Note  Patient Details Name: Alvin Clark MRN: [id number] DOB: 20-Oct-1961   Cancelled Treatment:    Reason Eval/Treat Not Completed: Patient's level of consciousness, after medication. Will check back tomorrow.   Rada Hay 12/29/2020, 2:39 PM  Blanchard Kelch PT Acute Rehabilitation Services Pager [phone] Office [phone]

## 2020-12-29 NOTE — Progress Notes (Signed)
PROGRESS NOTE  Alvin Clark  DOB: 1961-01-28  PCP: Patient, No Pcp Per [facility]:[id number]  DOA: 12/27/2020  LOS: 1 day   Chief Complaint  Patient presents with  . Altered Mental Status   Brief narrative: Alvin Clark is a 60 y.o. male with PMH significant for chronic alcoholism.   3/9, patient was trying to enter the home that he was evicted from.  Homeowners called [place] PD found him frail and disoriented.  EMS was called in. EMS called patient's family who stated that patient had concerns for chronic alcoholism and developing memory issues for last 1 to 2 years.   In the ED, patient was afebrile, slightly tachycardic, blood pressure stable. Labs showed BUN/creatinine 64/1.83, from a baseline of normal 3 years ago.  AST/ALT elevated to 129/113, WC count slightly elevated 12.4. Blood alcohol level was negative.  Urine drug screen was negative urinalysis showed hazy yellow urine. CT scan of the head showed generalized volume loss advanced for his age, without acute abnormality. CT cervical spine showed advanced cervical spine degeneration with suspected multilevel mild spinal stenosis. Chest x-ray unremarkable. Patient was kept in observation under hospitalist service.  While in the ED, patient has remained altered. He also fell out of bed this morning without obvious injuries. See below for details  Subjective: Patient was seen and examined this morning. Propped up in bed.  Half awake.  Mumbles few words and falls asleep. Later this morning, patient woke up and became agitated soon.  He was given IV Ativan.  Assessment/Plan: Acute metabolic encephalopathy Chronic evolving memory issues Chronic alcoholism  At risk of alcohol withdrawal -Patient has chronic alcoholism.  Family reports worsening memory issues for last 1 to 2 years.  CT scan of head showed generalized volume loss advanced for his age. -Brought in for disorientation, abnormal behavior.  Dehydration might have  precipitated an acute delirium and disorientation.  Blood alcohol level negative.  UDS normal. -It is hard to keep patient awake, oriented and calm.  He is either agitated or sedated.   -Currently on scheduled Librium and IV Ativan as needed.  AKI -Baseline creatinine less than 1. -Creatinine elevated to 1.83 on admission.  -Improved creatinine with IV fluid.  Currently oral intake is poor.  Resume normal saline at 75 mill per hour Recent Labs    12/27/20 1634 12/28/20 0614 12/28/20 1158 12/29/20 0359  BUN 64* 49* 43* 27*  CREATININE 1.83* 1.35* 1.29* 1.12   Hypophosphatemia -Phosphorus level low at 2.4 today.  Replacement given. Recent Labs  Lab 12/27/20 1634 12/27/20 1700 12/28/20 0614 12/28/20 1158 12/29/20 0359  K 3.6  --  3.4* 3.5 3.7  MG  --  2.8*  --  2.4 2.4  PHOS  --  6.3*  --  2.9 2.4*   Transaminitis  -Probably due to chronic liver disease with alcoholism.  Liver enzymes overall improving.  Continue to monitor. Recent Labs  Lab 12/27/20 1634 12/28/20 0614 12/28/20 1158  AST 129* 97* 103*  ALT 113* 89* 94*  ALKPHOS 80 69 71  BILITOT 1.4* 1.3* 1.4*  PROT 8.4* 7.2 7.6  ALBUMIN 4.1 3.7 3.8   Leukocytosis -Probably due to dehydration.  Continue to monitor. Recent Labs  Lab 12/27/20 1634 12/28/20 1158 12/29/20 0359  WBC 12.4* 12.7* 9.0   Constipation -Senokot, MiraLAX as needed  Mobility: Encourage ambulation.  PT eval when more awake Code Status:   Code Status: Full Code  Nutritional status: Body mass index is 18.17 kg/m.  Diet Order            Diet heart healthy/carb modified Room service appropriate? Yes; Fluid consistency: Thin  Diet effective now                 DVT prophylaxis: enoxaparin (LOVENOX) injection 40 mg Start: 12/27/20 2200   Antimicrobials:  None Fluid: Normal saline at 75 mill per hour Consultants: None Family Communication:  None at bedside  Status is: Inpatient  Remains inpatient appropriate because:  Mental status unstable.  Needs IV fluid. Dispo: The patient is from: Home              Anticipated d/c is to: Home, next 2 to 3 days.              Patient currently is not medically stable to d/c.   Difficult to place patient No  Infusions:  . sodium chloride    . potassium PHOSPHATE IVPB (in mmol)      Scheduled Meds: . chlordiazePOXIDE  25 mg Oral QID  . enoxaparin (LOVENOX) injection  40 mg Subcutaneous Q24H  . feeding supplement  237 mL Oral TID BM  . folic acid  1 mg Oral Daily  . multivitamin with minerals  1 tablet Oral Daily  . polyethylene glycol  17 g Oral Daily  . sodium chloride flush  3 mL Intravenous Q12H  . thiamine  100 mg Oral Daily   Or  . thiamine  100 mg Intravenous Daily    Antimicrobials: Anti-infectives (From admission, onward)   None      PRN meds: haloperidol lactate, LORazepam **OR** LORazepam   Objective: Vitals:   12/29/20 0543 12/29/20 1219  BP: (!) 87/66 90/66  Pulse: 70 64  Resp: 20 18  Temp: (!) 97.5 F (36.4 C) 97.9 F (36.6 C)  SpO2: 100% 100%    Intake/Output Summary (Last 24 hours) at 12/29/2020 1408 Last data filed at 12/29/2020 0400 Gross per 24 hour  Intake 1035.22 ml  Output 400 ml  Net 635.22 ml   Filed Weights   12/27/20 1656 12/29/20 0500  Weight: 68 kg 55.8 kg   Weight change: -12.2 kg Body mass index is 18.17 kg/m.   Physical Exam: General exam: Middle-aged African-American male, propped up in bed.  Not in physical pain Skin: No rashes, lesions or ulcers. HEENT: Atraumatic, normocephalic, no obvious bleeding Lungs: Clear to auscultation bilaterally CVS: Regular rate and rhythm, no murmur GI/Abd soft, nontender, nondistended, bowel sound present CNS: Partially sedated, opens eyes on verbal command.  Mumbles falls right back asleep.   Psychiatry: Mood appropriate Extremities: No pedal edema, no calf tenderness  Data Review: I have personally reviewed the laboratory data and studies available.  Recent  Labs  Lab 12/27/20 1634 12/28/20 1158 12/29/20 0359  WBC 12.4* 12.7* 9.0  NEUTROABS 9.3*  --  5.8  HGB 15.7 15.7 11.9*  HCT 48.4 48.3 36.9*  MCV 99.4 100.2* 101.4*  PLT 298 277 213   Recent Labs  Lab 12/27/20 1634 12/27/20 1700 12/28/20 0614 12/28/20 1158 12/29/20 0359  NA 144  --  145 146* 144  K 3.6  --  3.4* 3.5 3.7  CL 108  --  111 111 113*  CO2 22  --  25 23 23   GLUCOSE 136*  --  100* 133* 129*  BUN 64*  --  49* 43* 27*  CREATININE 1.83*  --  1.35* 1.29* 1.12  CALCIUM 9.7  --  8.9 9.6 8.7*  MG  --  2.8*  --  2.4 2.4  PHOS  --  6.3*  --  2.9 2.4*    F/u labs ordered Unresulted Labs (From admission, onward)          Start     Ordered   01/03/21 0500  Creatinine, serum  (enoxaparin (LOVENOX)    CrCl >/= 30 ml/min)  Weekly,   R     Comments: while on enoxaparin therapy    12/27/20 2008   12/30/20 0500  Hepatic function panel  Daily,   R     Question:  Specimen collection method  Answer:  Lab=Lab collect   12/29/20 1407   12/29/20 0500  CBC with Differential/Platelet  Daily,   R      12/28/20 0943   12/29/20 0500  Basic metabolic panel  Daily,   R      12/28/20 0943          Signed, Lorin Glass, MD [facility] 12/29/2020

## 2020-12-30 LAB — CBC WITH DIFFERENTIAL/PLATELET
Abs Immature Granulocytes: 0.02 10*3/uL (ref 0.00–0.07)
Basophils Absolute: 0 10*3/uL (ref 0.0–0.1)
Basophils Relative: 1 %
Eosinophils Absolute: 0.1 10*3/uL (ref 0.0–0.5)
Eosinophils Relative: 1 %
HCT: 33.9 % — ABNORMAL LOW (ref 39.0–52.0)
Hemoglobin: 11.1 g/dL — ABNORMAL LOW (ref 13.0–17.0)
Immature Granulocytes: 0 %
Lymphocytes Relative: 22 %
Lymphs Abs: 1.7 10*3/uL (ref 0.7–4.0)
MCH: 32.7 pg (ref 26.0–34.0)
MCHC: 32.7 g/dL (ref 30.0–36.0)
MCV: 100 fL (ref 80.0–100.0)
Monocytes Absolute: 0.7 10*3/uL (ref 0.1–1.0)
Monocytes Relative: 9 %
Neutro Abs: 5.3 10*3/uL (ref 1.7–7.7)
Neutrophils Relative %: 67 %
Platelets: 182 10*3/uL (ref 150–400)
RBC: 3.39 MIL/uL — ABNORMAL LOW (ref 4.22–5.81)
RDW: 13.4 % (ref 11.5–15.5)
WBC: 7.8 10*3/uL (ref 4.0–10.5)
nRBC: 0 % (ref 0.0–0.2)

## 2020-12-30 LAB — HEPATIC FUNCTION PANEL
ALT: 59 U/L — ABNORMAL HIGH (ref 0–44)
AST: 63 U/L — ABNORMAL HIGH (ref 15–41)
Albumin: 2.9 g/dL — ABNORMAL LOW (ref 3.5–5.0)
Alkaline Phosphatase: 53 U/L (ref 38–126)
Bilirubin, Direct: 0.3 mg/dL — ABNORMAL HIGH (ref 0.0–0.2)
Indirect Bilirubin: 0.5 mg/dL (ref 0.3–0.9)
Total Bilirubin: 0.8 mg/dL (ref 0.3–1.2)
Total Protein: 5.6 g/dL — ABNORMAL LOW (ref 6.5–8.1)

## 2020-12-30 LAB — BASIC METABOLIC PANEL
Anion gap: 7 (ref 5–15)
BUN: 13 mg/dL (ref 6–20)
CO2: 23 mmol/L (ref 22–32)
Calcium: 8.4 mg/dL — ABNORMAL LOW (ref 8.9–10.3)
Chloride: 112 mmol/L — ABNORMAL HIGH (ref 98–111)
Creatinine, Ser: 0.88 mg/dL (ref 0.61–1.24)
GFR, Estimated: >60 mL/min (ref >60)
Glucose, Bld: 101 mg/dL — ABNORMAL HIGH (ref 70–99)
Potassium: 3.1 mmol/L — ABNORMAL LOW (ref 3.5–5.1)
Sodium: 142 mmol/L (ref 135–145)

## 2020-12-30 MED ORDER — HALOPERIDOL LACTATE 5 MG/ML IJ SOLN
2.0000 mg | Freq: Four times a day (QID) | INTRAMUSCULAR | Status: DC | PRN
  Administered 2021-01-01 – 2021-01-02 (×2): 2 mg via INTRAMUSCULAR
  Filled 2020-12-30 (×2): qty 1

## 2020-12-30 MED ORDER — POTASSIUM CHLORIDE CRYS ER 20 MEQ PO TBCR
40.0000 meq | EXTENDED_RELEASE_TABLET | Freq: Once | ORAL | Status: AC
  Administered 2020-12-30: 40 meq via ORAL
  Filled 2020-12-30: qty 2

## 2020-12-30 NOTE — Plan of Care (Signed)

## 2020-12-30 NOTE — Progress Notes (Signed)
PROGRESS NOTE  Jayvien Rowlette Capwell  DOB: Apr 08, 1961  PCP: Patient, No Pcp Per [id number]  DOA: 12/27/2020  LOS: 2 days   Chief Complaint  Patient presents with  . Altered Mental Status   Brief narrative: DEJAY KRONK is a 60 y.o. male with PMH significant for chronic alcoholism.   3/9, patient was trying to enter the home that he was evicted from.  Homeowners called [place] [employer] found him frail and disoriented.  EMS was called in. EMS called patient's family who stated that patient had concerns for chronic alcoholism and developing memory issues for last 1 to 2 years.   In the ED, patient was afebrile, slightly tachycardic, blood pressure stable. Labs showed BUN/creatinine 64/1.83, from a baseline of normal 3 years ago.  AST/ALT elevated to 129/113, WC count slightly elevated 12.4. Blood alcohol level was negative.  Urine drug screen was negative urinalysis showed hazy yellow urine. CT scan of the head showed generalized volume loss advanced for his age, without acute abnormality. CT cervical spine showed advanced cervical spine degeneration with suspected multilevel mild spinal stenosis. Chest x-ray unremarkable. Patient was kept in observation under hospitalist service.  While in the ED, patient has remained altered. He also fell out of bed this morning without obvious injuries. See below for details  Subjective: Patient was seen and examined this morning. More awake this morning, eating his breakfast.  Knows he is in the hospital.  Does not engage in conversation.  Not agitated or restless.  Assessment/Plan: Acute metabolic encephalopathy Chronic evolving memory issues Chronic alcoholism  At risk of alcohol withdrawal -Patient has chronic alcoholism.  Family reports worsening memory issues for last 1 to 2 years.  CT scan of head showed generalized volume loss advanced for his age. -Brought in for disorientation, abnormal behavior.  Dehydration might have precipitated an acute  delirium and disorientation.  Blood alcohol level negative.  UDS normal. -It is hard to keep patient awake, oriented and calm.  He is either agitated or sedated.  He is at least awake and eating this morning.  I would avoid scheduled Librium today and only depend on as needed Ativan IV.  AKI -Baseline creatinine less than 1. -Creatinine elevated to 1.83 on admission.  -Improved creatinine with IV fluid.  Since she has resumed oral intake.  Okay to stop IV fluid. Recent Labs    12/27/20 1634 12/28/20 0614 12/28/20 1158 12/29/20 0359 12/30/20 0437  BUN 64* 49* 43* 27* 13  CREATININE 1.83* 1.35* 1.29* 1.12 0.88   Hypokalemia/hypophosphatemia -Potassium level low at 3.1 today.  Replacement given.  Repeat labs tomorrow. Recent Labs  Lab 12/27/20 1634 12/27/20 1700 12/28/20 0614 12/28/20 1158 12/29/20 0359 12/30/20 0437  K 3.6  --  3.4* 3.5 3.7 3.1*  MG  --  2.8*  --  2.4 2.4  --   PHOS  --  6.3*  --  2.9 2.4*  --    Transaminitis  -Probably due to chronic liver disease with alcoholism.  Liver enzymes overall improving.  Continue to monitor. Recent Labs  Lab 12/27/20 1634 12/28/20 0614 12/28/20 1158 12/30/20 0437  AST 129* 97* 103* 63*  ALT 113* 89* 94* 59*  ALKPHOS 80 69 71 53  BILITOT 1.4* 1.3* 1.4* 0.8  PROT 8.4* 7.2 7.6 5.6*  ALBUMIN 4.1 3.7 3.8 2.9*   Leukocytosis -Probably due to dehydration.  Improved.  No evidence of infection. Recent Labs  Lab 12/27/20 1634 12/28/20 1158 12/29/20 0359 12/30/20 0437  WBC 12.4* 12.7*  9.0 7.8   Constipation -Senokot, MiraLAX as needed  Mobility: Encourage ambulation.  PT eval pending Code Status:   Code Status: Full Code  Nutritional status: Body mass index is 18.17 kg/m. Nutrition Problem: Severe Malnutrition Etiology: chronic illness (chronic alcoholism) Signs/Symptoms: severe fat depletion,severe muscle depletion Diet Order            Diet heart healthy/carb modified Room service appropriate? Yes; Fluid  consistency: Thin  Diet effective now                 DVT prophylaxis: enoxaparin (LOVENOX) injection 40 mg Start: 12/27/20 2200   Antimicrobials:  None Fluid: Stop IV fluid Consultants: None Family Communication:  None at bedside  Status is: Inpatient  Remains inpatient appropriate because: Mental status unstable.  Pending PT eval, Dispo: The patient is from: Home              Anticipated d/c is to: Home, next 2 to 3 days.              Patient currently is not medically stable to d/c.   Difficult to place patient No  Infusions:    Scheduled Meds: . enoxaparin (LOVENOX) injection  40 mg Subcutaneous Q24H  . feeding supplement  237 mL Oral TID BM  . folic acid  1 mg Oral Daily  . multivitamin with minerals  1 tablet Oral Daily  . polyethylene glycol  17 g Oral Daily  . potassium chloride  40 mEq Oral Once  . sodium chloride flush  3 mL Intravenous Q12H  . thiamine  100 mg Oral Daily   Or  . thiamine  100 mg Intravenous Daily    Antimicrobials: Anti-infectives (From admission, onward)   None      PRN meds: haloperidol lactate, LORazepam **OR** LORazepam   Objective: Vitals:   12/29/20 1941 12/30/20 0445  BP: 98/68 106/77  Pulse: 83 80  Resp: 17 17  Temp: 98.2 F (36.8 C) 98 F (36.7 C)  SpO2: 100% 100%    Intake/Output Summary (Last 24 hours) at 12/30/2020 0959 Last data filed at 12/30/2020 8527 Gross per 24 hour  Intake 742.06 ml  Output --  Net 742.06 ml   Filed Weights   12/27/20 1656 12/29/20 0500  Weight: 68 kg 55.8 kg   Weight change:  Body mass index is 18.17 kg/m.   Physical Exam: General exam: Middle-aged African-American male, propped up in bed.  Not in physical pain Skin: No rashes, lesions or ulcers. HEENT: Atraumatic, normocephalic, no obvious bleeding Lungs: Clear to auscultation bilaterally CVS: Regular rate and rhythm, no murmur GI/Abd soft, nontender, nondistended, bowel sound present CNS: Alert, awake, eating, oriented to  place.  Does not want to engage in other conversation today. Psychiatry: Mood appropriate Extremities: No pedal edema, no calf tenderness  Data Review: I have personally reviewed the laboratory data and studies available.  Recent Labs  Lab 12/27/20 1634 12/28/20 1158 12/29/20 0359 12/30/20 0437  WBC 12.4* 12.7* 9.0 7.8  NEUTROABS 9.3*  --  5.8 5.3  HGB 15.7 15.7 11.9* 11.1*  HCT 48.4 48.3 36.9* 33.9*  MCV 99.4 100.2* 101.4* 100.0  PLT 298 277 213 182   Recent Labs  Lab 12/27/20 1634 12/27/20 1700 12/28/20 0614 12/28/20 1158 12/29/20 0359 12/30/20 0437  NA 144  --  145 146* 144 142  K 3.6  --  3.4* 3.5 3.7 3.1*  CL 108  --  111 111 113* 112*  CO2 22  --  25  23 23 23   GLUCOSE 136*  --  100* 133* 129* 101*  BUN 64*  --  49* 43* 27* 13  CREATININE 1.83*  --  1.35* 1.29* 1.12 0.88  CALCIUM 9.7  --  8.9 9.6 8.7* 8.4*  MG  --  2.8*  --  2.4 2.4  --   PHOS  --  6.3*  --  2.9 2.4*  --     F/u labs ordered Unresulted Labs (From admission, onward)          Start     Ordered   01/03/21 0500  Creatinine, serum  (enoxaparin (LOVENOX)    CrCl >/= 30 ml/min)  Weekly,   R     Comments: while on enoxaparin therapy    12/27/20 2008   12/31/20 0500  Magnesium  Tomorrow morning,   STAT       Question:  Specimen collection method  Answer:  Lab=Lab collect   12/30/20 0959   12/31/20 0500  Phosphorus  Tomorrow morning,   R       Question:  Specimen collection method  Answer:  Lab=Lab collect   12/30/20 0959   12/30/20 0500  Hepatic function panel  Daily,   R     Question:  Specimen collection method  Answer:  Lab=Lab collect   12/29/20 1407   12/29/20 0500  CBC with Differential/Platelet  Daily,   R      12/28/20 0943   12/29/20 0500  Basic metabolic panel  Daily,   R      12/28/20 0943          Signed, 02/27/21, MD [facility] 12/30/2020

## 2020-12-30 NOTE — Evaluation (Signed)
Physical Therapy Evaluation Patient Details Name: Alvin Clark MRN: [id number] DOB: August 02, 1961 Today's Date: 12/30/2020   History of Present Illness  Pt admitted with Acute metabolic encephalopathy, Chronic evolving memory issues, Chronic alcoholism. He had a fall out of bed on 3/11.  Clinical Impression  Pt admitted with above diagnosis.  Pt currently with functional limitations due to the deficits listed below (see PT Problem List). Pt will benefit from skilled PT to increase their independence and safety with mobility to allow discharge to the venue listed below.  At this time he is unsteady with gait attempts with ataxia and poor balance, so recommend SNF.  Will follow progress and continue to assess.     Follow Up Recommendations SNF    Equipment Recommendations  None recommended by PT    Recommendations for Other Services       Precautions / Restrictions Precautions Precautions: Fall Restrictions Weight Bearing Restrictions: No      Mobility  Bed Mobility Overal bed mobility: Needs Assistance Bed Mobility: Supine to Sit;Sit to Supine     Supine to sit: Supervision Sit to supine: Supervision   General bed mobility comments: S for safety    Transfers Overall transfer level: Needs assistance Equipment used: Rolling walker (2 wheeled) Transfers: Sit to/from Stand Sit to Stand: Min guard;+2 safety/equipment;From elevated surface         General transfer comment: Multiple sit to stands- poor recall of hand placement.  Ambulation/Gait Ambulation/Gait assistance: Min assist;Mod assist;+2 physical assistance Gait Distance (Feet): 3 Feet Assistive device: Rolling walker (2 wheeled) Gait Pattern/deviations: Shuffle;Decreased step length - right;Decreased step length - left     General Gait Details: Side stepping in front of bed and then forward and backward. Was going to attempt further ambulation in room, but became more ataxic and decreased balance.  Stairs             Wheelchair Mobility    Modified Rankin (Stroke Patients Only)       Balance Overall balance assessment: Needs assistance;History of Falls Sitting-balance support: Feet supported Sitting balance-Leahy Scale: Fair     Standing balance support: Bilateral upper extremity supported Standing balance-Leahy Scale: Poor Standing balance comment: flexed posture that when cued to stand upright then he shifts posteriorly.  Tends to keep legs against the bed to help balance himself.                             Pertinent Vitals/Pain Pain Assessment: Faces Faces Pain Scale: Hurts whole lot Pain Location: low back Pain Intervention(s): Limited activity within patient's tolerance;Monitored during session;Repositioned    Home Living Family/patient expects to be discharged to:: Unsure                 Additional Comments: Poor historian    Prior Function           Comments: Unsure if used assistive device     Hand Dominance        Extremity/Trunk Assessment   Upper Extremity Assessment Upper Extremity Assessment: Overall WFL for tasks assessed    Lower Extremity Assessment Lower Extremity Assessment: Generalized weakness       Communication      Cognition Arousal/Alertness: Awake/alert Behavior During Therapy: Impulsive Overall Cognitive Status: No family/caregiver present to determine baseline cognitive functioning  General Comments      Exercises     Assessment/Plan    PT Assessment Patient needs continued PT services  PT Problem List Decreased strength;Decreased activity tolerance;Decreased balance;Decreased knowledge of use of DME;Decreased safety awareness       PT Treatment Interventions DME instruction;Gait training;Therapeutic activities;Therapeutic exercise;Balance training;Neuromuscular re-education    PT Goals (Current goals can be found in the Care Plan section)   Acute Rehab PT Goals Patient Stated Goal: Agreeable to work with PT PT Goal Formulation: With patient Time For Goal Achievement: 01/13/21 Potential to Achieve Goals: Good    Frequency Min 3X/week   Barriers to discharge        Co-evaluation               AM-PAC PT "6 Clicks" Mobility  Outcome Measure Help needed turning from your back to your side while in a flat bed without using bedrails?: A Little Help needed moving from lying on your back to sitting on the side of a flat bed without using bedrails?: A Little Help needed moving to and from a bed to a chair (including a wheelchair)?: A Little Help needed standing up from a chair using your arms (e.g., wheelchair or bedside chair)?: A Little Help needed to walk in hospital room?: A Lot Help needed climbing 3-5 steps with a railing? : A Lot 6 Click Score: 16    End of Session Equipment Utilized During Treatment: Gait belt Activity Tolerance: Patient tolerated treatment well Patient left: in bed;with call bell/phone within reach;with bed alarm set Nurse Communication: Mobility status PT Visit Diagnosis: Unsteadiness on feet (R26.81);Other abnormalities of gait and mobility (R26.89);History of falling (Z91.81)    Time: 4650-3546 PT Time Calculation (min) (ACUTE ONLY): 20 min   Charges:   PT Evaluation $PT Eval Moderate Complexity: 1 Mod          Daren Doswell L. Katrinka Blazing, [place] Pager [phone] 12/30/2020   Enzo Montgomery 12/30/2020, 1:00 PM

## 2020-12-31 LAB — BASIC METABOLIC PANEL
Anion gap: 5 (ref 5–15)
BUN: 9 mg/dL (ref 6–20)
CO2: 23 mmol/L (ref 22–32)
Calcium: 8.5 mg/dL — ABNORMAL LOW (ref 8.9–10.3)
Chloride: 112 mmol/L — ABNORMAL HIGH (ref 98–111)
Creatinine, Ser: 0.88 mg/dL (ref 0.61–1.24)
GFR, Estimated: >60 mL/min (ref >60)
Glucose, Bld: 114 mg/dL — ABNORMAL HIGH (ref 70–99)
Potassium: 3.6 mmol/L (ref 3.5–5.1)
Sodium: 140 mmol/L (ref 135–145)

## 2020-12-31 LAB — HEPATIC FUNCTION PANEL
ALT: 49 U/L — ABNORMAL HIGH (ref 0–44)
AST: 52 U/L — ABNORMAL HIGH (ref 15–41)
Albumin: 2.7 g/dL — ABNORMAL LOW (ref 3.5–5.0)
Alkaline Phosphatase: 50 U/L (ref 38–126)
Bilirubin, Direct: 0.2 mg/dL (ref 0.0–0.2)
Indirect Bilirubin: 0.7 mg/dL (ref 0.3–0.9)
Total Bilirubin: 0.9 mg/dL (ref 0.3–1.2)
Total Protein: 5.4 g/dL — ABNORMAL LOW (ref 6.5–8.1)

## 2020-12-31 LAB — CBC WITH DIFFERENTIAL/PLATELET
Abs Immature Granulocytes: 0.03 10*3/uL (ref 0.00–0.07)
Basophils Absolute: 0 10*3/uL (ref 0.0–0.1)
Basophils Relative: 1 %
Eosinophils Absolute: 0.1 10*3/uL (ref 0.0–0.5)
Eosinophils Relative: 2 %
HCT: 32.2 % — ABNORMAL LOW (ref 39.0–52.0)
Hemoglobin: 10.3 g/dL — ABNORMAL LOW (ref 13.0–17.0)
Immature Granulocytes: 0 %
Lymphocytes Relative: 22 %
Lymphs Abs: 1.7 10*3/uL (ref 0.7–4.0)
MCH: 32.4 pg (ref 26.0–34.0)
MCHC: 32 g/dL (ref 30.0–36.0)
MCV: 101.3 fL — ABNORMAL HIGH (ref 80.0–100.0)
Monocytes Absolute: 0.6 10*3/uL (ref 0.1–1.0)
Monocytes Relative: 8 %
Neutro Abs: 5.4 10*3/uL (ref 1.7–7.7)
Neutrophils Relative %: 67 %
Platelets: 168 10*3/uL (ref 150–400)
RBC: 3.18 MIL/uL — ABNORMAL LOW (ref 4.22–5.81)
RDW: 13.8 % (ref 11.5–15.5)
WBC: 7.9 10*3/uL (ref 4.0–10.5)
nRBC: 0 % (ref 0.0–0.2)

## 2020-12-31 LAB — MAGNESIUM: Magnesium: 1.8 mg/dL (ref 1.7–2.4)

## 2020-12-31 LAB — PHOSPHORUS: Phosphorus: 1.6 mg/dL — ABNORMAL LOW (ref 2.5–4.6)

## 2020-12-31 MED ORDER — K PHOS MONO-SOD PHOS DI & MONO 155-852-130 MG PO TABS
500.0000 mg | ORAL_TABLET | Freq: Two times a day (BID) | ORAL | Status: AC
  Administered 2020-12-31 – 2021-01-01 (×4): 500 mg via ORAL
  Filled 2020-12-31 (×4): qty 2

## 2020-12-31 MED ORDER — K PHOS MONO-SOD PHOS DI & MONO 155-852-130 MG PO TABS: 500.0000 mg | ORAL_TABLET | Freq: Once | ORAL | Status: DC

## 2020-12-31 NOTE — Plan of Care (Signed)

## 2020-12-31 NOTE — Progress Notes (Signed)
PROGRESS NOTE  Alvin Clark  DOB: 1961/01/15  PCP: Patient, No Pcp Per KVQ:[id number]  DOA: 12/27/2020  LOS: 3 days   Chief Complaint  Patient presents with  . Altered Mental Status   Brief narrative: Alvin Clark is a 60 y.o. male with PMH significant for chronic alcoholism.   3/9, patient was trying to enter the home that he was evicted from.  Homeowners called [place] [employer] found him frail and disoriented.  EMS was called in. EMS called patient's family who stated that patient had concerns for chronic alcoholism and developing memory issues for last 1 to 2 years.   In the ED, patient was afebrile, slightly tachycardic, blood pressure stable. Labs showed BUN/creatinine 64/1.83, from a baseline of normal 3 years ago.  AST/ALT elevated to 129/113, WC count slightly elevated 12.4. Blood alcohol level was negative.  Urine drug screen was negative urinalysis showed hazy yellow urine. CT scan of the head showed generalized volume loss advanced for his age, without acute abnormality. CT cervical spine showed advanced cervical spine degeneration with suspected multilevel mild spinal stenosis. Chest x-ray unremarkable. Patient was kept in observation under hospitalist service.  While in the ED, patient has remained altered. He also fell out of bed this morning without obvious injuries. See below for details  Subjective: Patient was seen and examined this morning. Propped up in bed.  Sleepy but opens eyes on verbal command, able to answer simple questions. Per RN, he has intermittent episodes of agitation mostly towards the evening.  Assessment/Plan: Acute metabolic encephalopathy Chronic evolving memory issues Chronic alcoholism  -Patient has chronic alcoholism.  Family reports worsening memory issues for last 1 to 2 years.  CT scan of head showed generalized volume loss advanced for his age. -Brought in for disorientation, abnormal behavior.  Dehydration might have precipitated an acute  delirium and disorientation.  Blood alcohol level negative.  UDS normal. -It is hard to keep patient awake, oriented and calm.  He is either agitated or sedated.  Mostly in the morning hours he is alert and calm but towards the evening, he gets agitated.  Currently on IV Ativan as needed and IV Haldol as needed.   Impaired mobility -PT eval obtained.  SNF recommended.  AKI -Baseline creatinine less than 1. -Creatinine elevated to 1.83 on admission.  Improved back to normal with hydration.  Hypokalemia/hypophosphatemia -Phosphorus level low at 1.6 today.  Potassium phosphate 500 mg twice a day for 2 days Recent Labs  Lab 12/27/20 1700 12/28/20 0614 12/28/20 1158 12/29/20 0359 12/30/20 0437 12/31/20 0430  K  --  3.4* 3.5 3.7 3.1* 3.6  MG 2.8*  --  2.4 2.4  --  1.8  PHOS 6.3*  --  2.9 2.4*  --  1.6*   Transaminitis  -Probably due to chronic liver disease with alcoholism.  Liver enzymes overall improving.  Continue to monitor. Recent Labs  Lab 12/27/20 1634 12/28/20 0614 12/28/20 1158 12/30/20 0437 12/31/20 0430  AST 129* 97* 103* 63* 52*  ALT 113* 89* 94* 59* 49*  ALKPHOS 80 69 71 53 50  BILITOT 1.4* 1.3* 1.4* 0.8 0.9  PROT 8.4* 7.2 7.6 5.6* 5.4*  ALBUMIN 4.1 3.7 3.8 2.9* 2.7*   Constipation -Senokot, MiraLAX as needed  Mobility: Encourage ambulation.  PT eval obtained.  SNF recommended Code Status:   Code Status: Full Code  Nutritional status: Body mass index is 19.53 kg/m. Nutrition Problem: Severe Malnutrition Etiology: chronic illness (chronic alcoholism) Signs/Symptoms: severe fat depletion,severe muscle depletion  Diet Order            Diet heart healthy/carb modified Room service appropriate? Yes; Fluid consistency: Thin  Diet effective now                 DVT prophylaxis: enoxaparin (LOVENOX) injection 40 mg Start: 12/27/20 2200   Antimicrobials:  None Fluid: IV fluid off Consultants: None Family Communication:  None at bedside  Status is:  Inpatient  Remains inpatient appropriate because: Pending SNF Dispo: The patient is from: Home              Anticipated d/c is to: Home, next 2 to 3 days.              Patient currently is medically stable to d/c.   Difficult to place patient No  Infusions:    Scheduled Meds: . enoxaparin (LOVENOX) injection  40 mg Subcutaneous Q24H  . feeding supplement  237 mL Oral TID BM  . folic acid  1 mg Oral Daily  . multivitamin with minerals  1 tablet Oral Daily  . phosphorus  500 mg Oral BID  . polyethylene glycol  17 g Oral Daily  . sodium chloride flush  3 mL Intravenous Q12H  . thiamine  100 mg Oral Daily   Or  . thiamine  100 mg Intravenous Daily    Antimicrobials: Anti-infectives (From admission, onward)   None      PRN meds: haloperidol lactate, LORazepam **OR** LORazepam   Objective: Vitals:   12/30/20 2022 12/31/20 0413  BP: 124/78 98/62  Pulse: 82 63  Resp: 16 16  Temp: 98.1 F (36.7 C) 98.1 F (36.7 C)  SpO2: 100% 100%    Intake/Output Summary (Last 24 hours) at 12/31/2020 1124 Last data filed at 12/31/2020 1026 Gross per 24 hour  Intake 960 ml  Output --  Net 960 ml   Filed Weights   12/27/20 1656 12/29/20 0500 12/31/20 0413  Weight: 68 kg 55.8 kg 60 kg   Weight change:  Body mass index is 19.53 kg/m.   Physical Exam: General exam: Middle-aged African-American male, propped up in bed.  Not in physical pain Skin: No rashes, lesions or ulcers. HEENT: Atraumatic, normocephalic, no obvious bleeding Lungs: Clear to auscultation bilaterally CVS: Regular rate and rhythm, no murmur GI/Abd soft, nontender, nondistended, bowel sound present CNS: Alert, awake, eating, oriented to place.  Does not want to engage in other conversation today. Psychiatry: Mood appropriate Extremities: No pedal edema, no calf tenderness  Data Review: I have personally reviewed the laboratory data and studies available.  Recent Labs  Lab 12/27/20 1634 12/28/20 1158  12/29/20 0359 12/30/20 0437 12/31/20 0430  WBC 12.4* 12.7* 9.0 7.8 7.9  NEUTROABS 9.3*  --  5.8 5.3 5.4  HGB 15.7 15.7 11.9* 11.1* 10.3*  HCT 48.4 48.3 36.9* 33.9* 32.2*  MCV 99.4 100.2* 101.4* 100.0 101.3*  PLT 298 277 213 182 168   Recent Labs  Lab 12/27/20 1700 12/28/20 0614 12/28/20 1158 12/29/20 0359 12/30/20 0437 12/31/20 0430  NA  --  145 146* 144 142 140  K  --  3.4* 3.5 3.7 3.1* 3.6  CL  --  111 111 113* 112* 112*  CO2  --  25 23 23 23 23   GLUCOSE  --  100* 133* 129* 101* 114*  BUN  --  49* 43* 27* 13 9  CREATININE  --  1.35* 1.29* 1.12 0.88 0.88  CALCIUM  --  8.9 9.6 8.7* 8.4* 8.5*  MG 2.8*  --  2.4 2.4  --  1.8  PHOS 6.3*  --  2.9 2.4*  --  1.6*    F/u labs ordered Unresulted Labs (From admission, onward)          Start     Ordered   01/03/21 0500  Creatinine, serum  (enoxaparin (LOVENOX)    CrCl >/= 30 ml/min)  Weekly,   R     Comments: while on enoxaparin therapy    12/27/20 2008   01/01/21 0500  Basic metabolic panel  Tomorrow morning,   R       Question:  Specimen collection method  Answer:  Lab=Lab collect   12/31/20 1124   01/01/21 0500  Phosphorus  Tomorrow morning,   R       Question:  Specimen collection method  Answer:  Lab=Lab collect   12/31/20 1124   12/30/20 0500  Hepatic function panel  Daily,   R     Question:  Specimen collection method  Answer:  Lab=Lab collect   12/29/20 1407          Signed, Lorin Glass, MD [facility] 12/31/2020

## 2021-01-01 LAB — BASIC METABOLIC PANEL
Anion gap: 8 (ref 5–15)
BUN: 8 mg/dL (ref 6–20)
CO2: 24 mmol/L (ref 22–32)
Calcium: 8.8 mg/dL — ABNORMAL LOW (ref 8.9–10.3)
Chloride: 110 mmol/L (ref 98–111)
Creatinine, Ser: 0.94 mg/dL (ref 0.61–1.24)
GFR, Estimated: >60 mL/min (ref >60)
Glucose, Bld: 135 mg/dL — ABNORMAL HIGH (ref 70–99)
Potassium: 3.9 mmol/L (ref 3.5–5.1)
Sodium: 142 mmol/L (ref 135–145)

## 2021-01-01 LAB — HEPATIC FUNCTION PANEL
ALT: 50 U/L — ABNORMAL HIGH (ref 0–44)
AST: 65 U/L — ABNORMAL HIGH (ref 15–41)
Albumin: 2.8 g/dL — ABNORMAL LOW (ref 3.5–5.0)
Alkaline Phosphatase: 55 U/L (ref 38–126)
Bilirubin, Direct: 0.4 mg/dL — ABNORMAL HIGH (ref 0.0–0.2)
Indirect Bilirubin: 0.7 mg/dL (ref 0.3–0.9)
Total Bilirubin: 1.1 mg/dL (ref 0.3–1.2)
Total Protein: 5.6 g/dL — ABNORMAL LOW (ref 6.5–8.1)

## 2021-01-01 LAB — PHOSPHORUS: Phosphorus: 2.1 mg/dL — ABNORMAL LOW (ref 2.5–4.6)

## 2021-01-01 NOTE — Progress Notes (Signed)
Physical Therapy Treatment Patient Details Name: Alvin Clark MRN: [id number] DOB: 01/24/1961 Today's Date: 01/01/2021    History of Present Illness Pt admitted ono 12/27/20 with Acute metabolic encephalopathy, Chronic evolving memory issues, Chronic alcoholism. He had a fall out of bed on 3/11.    PT Comments    Pt pleasant and agreeable to work with PT.  He is making progress but continues to demonstrate decreased safety awareness and requiring min A with balance to prevent falls.  Continue plan of care and recommendation for SNF at this time.    Follow Up Recommendations  SNF     Equipment Recommendations  Rolling walker with 5" wheels    Recommendations for Other Services       Precautions / Restrictions Precautions Precautions: Fall    Mobility  Bed Mobility Overal bed mobility: Needs Assistance Bed Mobility: Supine to Sit     Supine to sit: Supervision          Transfers Overall transfer level: Needs assistance Equipment used: Rolling walker (2 wheeled) Transfers: Sit to/from Stand Sit to Stand: Min guard;Min assist         General transfer comment: Pt initially started to stand without RW and requiring min A to steady and cues to return to sit and wait for RW.  Min guard with RW.  Cues to turn all the way when returning to sitting  Ambulation/Gait Ambulation/Gait assistance: Min assist Gait Distance (Feet): 150 Feet Assistive device: Rolling walker (2 wheeled) Gait Pattern/deviations: Decreased stride length;Shuffle;Trunk flexed;Scissoring Gait velocity: decreased   General Gait Details: Pt requiring frequent cues and assist for RW proximity and to correct trunk posture.  Cued to stop and regain balance and posture.  Tried multimodal cues and different directions to help improve step length but with little carryover.  1 episode of scissoring requiring assist for balance   Stairs             Wheelchair Mobility    Modified Rankin (Stroke  Patients Only)       Balance Overall balance assessment: Needs assistance;History of Falls Sitting-balance support: Feet supported Sitting balance-Leahy Scale: Good     Standing balance support: Bilateral upper extremity supported;No upper extremity supported Standing balance-Leahy Scale: Poor Standing balance comment: With RW requiring min guard and min A at times.  Without RW needed min A and legs braced on bed                            Cognition Arousal/Alertness: Awake/alert Behavior During Therapy: Impulsive Overall Cognitive Status: No family/caregiver present to determine baseline cognitive functioning Area of Impairment: Safety/judgement;Awareness;Problem solving;Orientation                 Orientation Level: Disoriented to;Time;Situation       Safety/Judgement: Decreased awareness of safety;Decreased awareness of deficits Awareness: Emergent Problem Solving: Difficulty sequencing;Requires verbal cues;Requires tactile cues General Comments: Pt does have memory deficits at baseline.      Exercises      General Comments        Pertinent Vitals/Pain Pain Assessment: No/denies pain    Home Living                      Prior Function            PT Goals (current goals can now be found in the care plan section) Acute Rehab PT Goals Patient Stated Goal: Agreeable to work  with PT PT Goal Formulation: With patient Time For Goal Achievement: 01/13/21 Potential to Achieve Goals: Good Progress towards PT goals: Progressing toward goals    Frequency    Min 3X/week      PT Plan Current plan remains appropriate    Co-evaluation              AM-PAC PT "6 Clicks" Mobility   Outcome Measure  Help needed turning from your back to your side while in a flat bed without using bedrails?: A Little Help needed moving from lying on your back to sitting on the side of a flat bed without using bedrails?: A Little Help needed moving  to and from a bed to a chair (including a wheelchair)?: A Little Help needed standing up from a chair using your arms (e.g., wheelchair or bedside chair)?: A Little Help needed to walk in hospital room?: A Little Help needed climbing 3-5 steps with a railing? : A Lot 6 Click Score: 17    End of Session Equipment Utilized During Treatment: Gait belt Activity Tolerance: Patient tolerated treatment well Patient left: with call bell/phone within reach;with chair alarm set;in chair Nurse Communication: Mobility status PT Visit Diagnosis: Unsteadiness on feet (R26.81);Other abnormalities of gait and mobility (R26.89);History of falling (Z91.81)     Time: 1240-1305 PT Time Calculation (min) (ACUTE ONLY): 25 min  Charges:  $Gait Training: 8-22 mins $Therapeutic Activity: 8-22 mins                     Anise Salvo, PT Acute Rehab Services Pager [phone] [facility] [phone]     Rayetta Humphrey 01/01/2021, 1:25 PM

## 2021-01-01 NOTE — TOC Progression Note (Signed)
Transition of Care [facility]) - Progression Note    Patient Details  Name: Alvin Clark MRN: [id number] Date of Birth: 04-May-1961  Transition of Care [facility]) CM/SW Contact  Darleene Cleaver, [place] Phone Number: 01/01/2021, 5:46 PM  Clinical Narrative:     CSW was informed that patient is being recommended SNF for short term rehab.  However patient does not have any insurance, he will not be able to go to SNF for rehab.     Expected Discharge Plan and Services                                                 Social Determinants of Health (SDOH) Interventions    Readmission Risk Interventions No flowsheet data found.

## 2021-01-01 NOTE — Progress Notes (Signed)
PROGRESS NOTE  Alvin Clark  DOB: [birth year]  PCP: Patient, No Pcp Per [id number]  DOA: 12/27/2020  LOS: 4 days   Chief Complaint  Patient presents with  . Altered Mental Status   Brief narrative: FARRIS GEIMAN is a 60 y.o. male with PMH significant for chronic alcoholism.   3/9, patient was trying to enter the home that he was evicted from.  Homeowners called [place] PD found him frail and disoriented.  EMS was called in. EMS called patient's family who stated that patient had concerns for chronic alcoholism and developing memory issues for last 1 to 2 years.   In the ED, patient was afebrile, slightly tachycardic, blood pressure stable. Labs showed BUN/creatinine 64/1.83, from a baseline of normal 3 years ago.  AST/ALT elevated to 129/113, WC count slightly elevated 12.4. Blood alcohol level was negative.  Urine drug screen was negative urinalysis showed hazy yellow urine. CT scan of the head showed generalized volume loss advanced for his age, without acute abnormality. CT cervical spine showed advanced cervical spine degeneration with suspected multilevel mild spinal stenosis. Chest x-ray unremarkable. Patient was kept in observation under hospitalist service.  While in the ED, patient has remained altered. He also fell out of bed this morning without obvious injuries. See below for details  Subjective: Patient was seen and examined this morning. Lying down in bed.  Calm, slow to respond.  Able to verbalize but not oriented to place, person or time.  Able to follow motor commands.  Not on any scheduled sedative.  Assessment/Plan: Acute metabolic encephalopathy Chronic evolving memory issues Chronic alcoholism  -Patient has chronic alcoholism.  Family reports worsening memory issues for last 1 to 2 years.  CT scan of head showed generalized volume loss advanced for his age. -Brought in for disorientation, abnormal behavior.  Dehydration might have precipitated an acute  delirium and disorientation.  Blood alcohol level negative.  UDS normal. -Mental status gradually improving.  But he still has intermittent agitations.  Not on scheduled sedative at this time.  Continue IV Ativan and IV Haldol. -Able to verbalize but remains disoriented this morning.  Impaired mobility -PT eval obtained.  SNF recommended.  AKI -Baseline creatinine less than 1. -Creatinine elevated to 1.83 on admission.  Improved back to normal with hydration.  Hypokalemia/hypophosphatemia -Phosphorus level improved but is still remains low at 2.1 today.  Continue phosphorus replacement oral. Recent Labs  Lab 12/27/20 1700 12/28/20 0614 12/28/20 1158 12/29/20 0359 12/30/20 0437 12/31/20 0430 01/01/21 0331  K  --    < > 3.5 3.7 3.1* 3.6 3.9  MG 2.8*  --  2.4 2.4  --  1.8  --   PHOS 6.3*  --  2.9 2.4*  --  1.6* 2.1*   < > = values in this interval not displayed.   Transaminitis  -Probably due to chronic liver disease with alcoholism.  Liver enzymes overall improving.  Continue to monitor. Recent Labs  Lab 12/28/20 0614 12/28/20 1158 12/30/20 0437 12/31/20 0430 01/01/21 0331  AST 97* 103* 63* 52* 65*  ALT 89* 94* 59* 49* 50*  ALKPHOS 69 71 53 50 55  BILITOT 1.3* 1.4* 0.8 0.9 1.1  PROT 7.2 7.6 5.6* 5.4* 5.6*  ALBUMIN 3.7 3.8 2.9* 2.7* 2.8*   Constipation -Senokot, MiraLAX as needed  Mobility: Encourage ambulation.  PT eval obtained.  SNF recommended Code Status:   Code Status: Full Code  Nutritional status: Body mass index is 20.38 kg/m. Nutrition Problem: Severe Malnutrition Etiology:  chronic illness (chronic alcoholism) Signs/Symptoms: severe fat depletion,severe muscle depletion Diet Order            Diet heart healthy/carb modified Room service appropriate? Yes; Fluid consistency: Thin  Diet effective now                 DVT prophylaxis: enoxaparin (LOVENOX) injection 40 mg Start: 12/27/20 2200   Antimicrobials:  None Fluid: IV fluid off Consultants:  None Family Communication:  None at bedside  Status is: Inpatient  Remains inpatient appropriate because: Pending SNF Dispo: The patient is from: Home              Anticipated d/c is to: Home, next 2 to 3 days.              Patient currently is medically stable to d/c.   Difficult to place patient No  Infusions:    Scheduled Meds: . enoxaparin (LOVENOX) injection  40 mg Subcutaneous Q24H  . feeding supplement  237 mL Oral TID BM  . folic acid  1 mg Oral Daily  . multivitamin with minerals  1 tablet Oral Daily  . phosphorus  500 mg Oral BID  . polyethylene glycol  17 g Oral Daily  . sodium chloride flush  3 mL Intravenous Q12H  . thiamine  100 mg Oral Daily   Or  . thiamine  100 mg Intravenous Daily    Antimicrobials: Anti-infectives (From admission, onward)   None      PRN meds: haloperidol lactate   Objective: Vitals:   12/31/20 2025 01/01/21 0417  BP: 105/60 108/68  Pulse: 76 87  Resp: 16 16  Temp: (!) 97.5 F (36.4 C) 97.6 F (36.4 C)  SpO2: 100% 100%    Intake/Output Summary (Last 24 hours) at 01/01/2021 1247 Last data filed at 01/01/2021 1100 Gross per 24 hour  Intake 991 ml  Output --  Net 991 ml   Filed Weights   12/29/20 0500 12/31/20 0413 01/01/21 0448  Weight: 55.8 kg 60 kg 62.6 kg   Weight change: 2.6 kg Body mass index is 20.38 kg/m.   Physical Exam: General exam: Middle-aged African-American male, propped up in bed.  Not in physical pain Skin: No rashes, lesions or ulcers. HEENT: Atraumatic, normocephalic, no obvious bleeding Lungs: Clear to auscultation bilaterally CVS: Regular rate and rhythm, no murmur GI/Abd soft, nontender, nondistended, bowel sound present CNS: Alert, awake, not agitated at the time of my evaluation.  Not oriented to place person or time Psychiatry: Mood appropriate Extremities: No pedal edema, no calf tenderness  Data Review: I have personally reviewed the laboratory data and studies available.  Recent Labs   Lab 12/27/20 1634 12/28/20 1158 12/29/20 0359 12/30/20 0437 12/31/20 0430  WBC 12.4* 12.7* 9.0 7.8 7.9  NEUTROABS 9.3*  --  5.8 5.3 5.4  HGB 15.7 15.7 11.9* 11.1* 10.3*  HCT 48.4 48.3 36.9* 33.9* 32.2*  MCV 99.4 100.2* 101.4* 100.0 101.3*  PLT 298 277 213 182 168   Recent Labs  Lab 12/27/20 1700 12/28/20 0614 12/28/20 1158 12/29/20 0359 12/30/20 0437 12/31/20 0430 01/01/21 0331  NA  --    < > 146* 144 142 140 142  K  --    < > 3.5 3.7 3.1* 3.6 3.9  CL  --    < > 111 113* 112* 112* 110  CO2  --    < > 23 23 23 23 24   GLUCOSE  --    < > 133* 129* 101*  114* 135*  BUN  --    < > 43* 27* 13 9 8   CREATININE  --    < > 1.29* 1.12 0.88 0.88 0.94  CALCIUM  --    < > 9.6 8.7* 8.4* 8.5* 8.8*  MG 2.8*  --  2.4 2.4  --  1.8  --   PHOS 6.3*  --  2.9 2.4*  --  1.6* 2.1*   < > = values in this interval not displayed.    F/u labs ordered Unresulted Labs (From admission, onward)          Start     Ordered   01/03/21 0500  Creatinine, serum  (enoxaparin (LOVENOX)    CrCl >/= 30 ml/min)  Weekly,   R     Comments: while on enoxaparin therapy    12/27/20 2008          Signed, 2009, MD [facility] 01/01/2021

## 2021-01-02 MED ORDER — K PHOS MONO-SOD PHOS DI & MONO 155-852-130 MG PO TABS
500.0000 mg | ORAL_TABLET | Freq: Two times a day (BID) | ORAL | Status: AC
  Administered 2021-01-02 – 2021-01-06 (×10): 500 mg via ORAL
  Filled 2021-01-02 (×10): qty 2

## 2021-01-02 NOTE — Progress Notes (Signed)
PROGRESS NOTE  Alvin Clark  DOB: 1960-11-16  PCP: Patient, No Pcp Per [id number]  DOA: 12/27/2020  LOS: 5 days   Chief Complaint  Patient presents with  . Altered Mental Status   Brief narrative: Alvin Clark is a 60 y.o. male with PMH significant for chronic alcoholism.   3/9, patient was trying to enter the home that he was evicted from.  Homeowners called [place] PD found him frail and disoriented.  EMS was called in. EMS called patient's family who stated that patient had concerns for chronic alcoholism and developing memory issues for last 1 to 2 years.   In the ED, patient was afebrile, slightly tachycardic, blood pressure stable. Labs showed BUN/creatinine 64/1.83, from a baseline of normal 3 years ago.  AST/ALT elevated to 129/113, WC count slightly elevated 12.4. Blood alcohol level was negative.  Urine drug screen was negative urinalysis showed hazy yellow urine. CT scan of the head showed generalized volume loss advanced for his age, without acute abnormality. CT cervical spine showed advanced cervical spine degeneration with suspected multilevel mild spinal stenosis. Chest x-ray unremarkable. Patient was admitted to hospital service.   Subjective: Patient was seen and examined this morning. At time of my evaluation, he is calm, composed, alert, awake.  Able to answer yes/no questions.  But not oriented to place, person or time. Last 24 hours, he has had episodes of agitation, restlessness requiring sitter.  Assessment/Plan: Acute metabolic encephalopathy Chronic evolving memory issues Chronic alcoholism  -Patient has chronic alcoholism.  Family reports worsening memory issues for last 1 to 2 years.  CT scan of head showed generalized volume loss advanced for his age. -Brought in for disorientation, abnormal behavior.  Dehydration might have precipitated an acute delirium and disorientation.  Blood alcohol level negative.  UDS normal. -Mental status has been waxing  and waning.  He has intermittent episodes of delirium, agitation. -Able to verbalize but remains disoriented this morning.  Impaired mobility -PT eval obtained.  SNF recommended.  AKI -Baseline creatinine less than 1. -Creatinine elevated to 1.83 on admission.  Improved back to normal with hydration.  Hypokalemia/hypophosphatemia -Phosphorus level continues to remain low.  Continue twice daily supplement. Recent Labs  Lab 12/27/20 1700 12/28/20 0614 12/28/20 1158 12/29/20 0359 12/30/20 0437 12/31/20 0430 01/01/21 0331  K  --    < > 3.5 3.7 3.1* 3.6 3.9  MG 2.8*  --  2.4 2.4  --  1.8  --   PHOS 6.3*  --  2.9 2.4*  --  1.6* 2.1*   < > = values in this interval not displayed.   Transaminitis  -Probably due to chronic liver disease with alcoholism.  Liver enzymes overall improving.  Continue to monitor. Recent Labs  Lab 12/28/20 0614 12/28/20 1158 12/30/20 0437 12/31/20 0430 01/01/21 0331  AST 97* 103* 63* 52* 65*  ALT 89* 94* 59* 49* 50*  ALKPHOS 69 71 53 50 55  BILITOT 1.3* 1.4* 0.8 0.9 1.1  PROT 7.2 7.6 5.6* 5.4* 5.6*  ALBUMIN 3.7 3.8 2.9* 2.7* 2.8*   Constipation -Senokot, MiraLAX as needed  Mobility: Encourage ambulation.  PT eval obtained.  SNF recommended Code Status:   Code Status: Full Code  Nutritional status: Body mass index is 20.05 kg/m. Nutrition Problem: Severe Malnutrition Etiology: chronic illness (chronic alcoholism) Signs/Symptoms: severe fat depletion,severe muscle depletion Diet Order            Diet heart healthy/carb modified Room service appropriate? Yes; Fluid consistency: Thin  Diet  effective now                 DVT prophylaxis: enoxaparin (LOVENOX) injection 40 mg Start: 12/27/20 2200   Antimicrobials:  None Fluid: IV fluid off Consultants: None Family Communication:  None at bedside  Status is: Inpatient  Remains inpatient appropriate because: Pending SNF Dispo: The patient is from: Home              Anticipated d/c is  to: SNF when bed is available              Patient currently is medically stable to d/c.   Difficult to place patient No  Infusions:    Scheduled Meds: . enoxaparin (LOVENOX) injection  40 mg Subcutaneous Q24H  . feeding supplement  237 mL Oral TID BM  . folic acid  1 mg Oral Daily  . multivitamin with minerals  1 tablet Oral Daily  . phosphorus  500 mg Oral BID  . polyethylene glycol  17 g Oral Daily  . sodium chloride flush  3 mL Intravenous Q12H  . thiamine  100 mg Oral Daily   Or  . thiamine  100 mg Intravenous Daily    Antimicrobials: Anti-infectives (From admission, onward)   None      PRN meds: haloperidol lactate   Objective: Vitals:   01/01/21 2330 01/02/21 0439  BP: 124/90 106/70  Pulse: 99 81  Resp: 18 18  Temp: 98 F (36.7 C) 98 F (36.7 C)  SpO2: 100% 99%    Intake/Output Summary (Last 24 hours) at 01/02/2021 1106 Last data filed at 01/02/2021 0200 Gross per 24 hour  Intake -  Output 1700 ml  Net -1700 ml   Filed Weights   12/31/20 0413 01/01/21 0448 01/02/21 0500  Weight: 60 kg 62.6 kg 61.6 kg   Weight change: -1 kg Body mass index is 20.05 kg/m.   Physical Exam: General exam: Middle-aged African-American male, propped up in bed.  Not in physical pain Skin: No rashes, lesions or ulcers. HEENT: Atraumatic, normocephalic, no obvious bleeding Lungs: Clear to auscultation bilaterally. CVS: Regular rate and rhythm, no murmur GI/Abd soft, nontender, nondistended, bowel sound present CNS: Alert, awake, not agitated at the time of my evaluation.  Not oriented to place person or time Psychiatry: Mood appropriate Extremities: No pedal edema, no calf tenderness  Data Review: I have personally reviewed the laboratory data and studies available.  Recent Labs  Lab 12/27/20 1634 12/28/20 1158 12/29/20 0359 12/30/20 0437 12/31/20 0430  WBC 12.4* 12.7* 9.0 7.8 7.9  NEUTROABS 9.3*  --  5.8 5.3 5.4  HGB 15.7 15.7 11.9* 11.1* 10.3*  HCT 48.4 48.3  36.9* 33.9* 32.2*  MCV 99.4 100.2* 101.4* 100.0 101.3*  PLT 298 277 213 182 168   Recent Labs  Lab 12/27/20 1700 12/28/20 0614 12/28/20 1158 12/29/20 0359 12/30/20 0437 12/31/20 0430 01/01/21 0331  NA  --    < > 146* 144 142 140 142  K  --    < > 3.5 3.7 3.1* 3.6 3.9  CL  --    < > 111 113* 112* 112* 110  CO2  --    < > 23 23 23 23 24   GLUCOSE  --    < > 133* 129* 101* 114* 135*  BUN  --    < > 43* 27* 13 9 8   CREATININE  --    < > 1.29* 1.12 0.88 0.88 0.94  CALCIUM  --    < >  9.6 8.7* 8.4* 8.5* 8.8*  MG 2.8*  --  2.4 2.4  --  1.8  --   PHOS 6.3*  --  2.9 2.4*  --  1.6* 2.1*   < > = values in this interval not displayed.    F/u labs ordered Unresulted Labs (From admission, onward)          Start     Ordered   01/03/21 0500  Creatinine, serum  (enoxaparin (LOVENOX)    CrCl >/= 30 ml/min)  Weekly,   R     Comments: while on enoxaparin therapy    12/27/20 2008   01/03/21 0500  CBC with Differential/Platelet  Tomorrow morning,   R       Question:  Specimen collection method  Answer:  Lab=Lab collect   01/02/21 0759   01/03/21 0500  Basic metabolic panel  Tomorrow morning,   R       Question:  Specimen collection method  Answer:  Lab=Lab collect   01/02/21 0759   01/03/21 0500  Magnesium  Tomorrow morning,   STAT       Question:  Specimen collection method  Answer:  Lab=Lab collect   01/02/21 0759   01/03/21 0500  Phosphorus  Tomorrow morning,   R       Question:  Specimen collection method  Answer:  Lab=Lab collect   01/02/21 0759          Signed, Lorin Glass, MD [facility] 01/02/2021

## 2021-01-02 NOTE — TOC Progression Note (Signed)
Transition of Care [facility]) - Progression Note    Patient Details  Name: LUDWIG TUGWELL MRN: [id number] Date of Birth: Jul 04, 1961  [facility]) CM/SW Contact  Darleene Cleaver, [place] Phone Number: 01/02/2021, 12:13 PM  Clinical Narrative:    Patient does not have any insurance, CSW has faxed information to all nursing facilities to see if a facility is able to accept patient.  CSW to continue to follow patient's progress throughout discharge planning.     Expected Discharge Plan and Services         Social Determinants of Health (SDOH) Interventions    Readmission Risk Interventions No flowsheet data found.

## 2021-01-02 NOTE — Progress Notes (Signed)
   01/02/21 0000  What Happened  Was fall witnessed? No  Patients activity before fall to/from bed, chair, or stretcher  Point of contact buttocks  Was patient injured? No  Patient found on floor  Found by Staff-comment Jon Gills)  Stated prior activity to/from bed, chair, or stretcher  Follow Up  MD notified T. Opyd  Time MD notified 2330  Family notified No - patient refusal  Additional tests No  Simple treatment  (no injury)  Progress note created (see row info) Yes  Adult Fall Risk Assessment  Risk Factor Category (scoring not indicated) Fall has occurred during this admission (document High fall risk)  Age 60  Fall History: Fall within 6 months prior to admission 5  Elimination; Bowel and/or Urine Incontinence 0  Elimination; Bowel and/or Urine Urgency/Frequency 0  Medications: includes PCA/Opiates, Anti-convulsants, Anti-hypertensives, Diuretics, Hypnotics, Laxatives, Sedatives, and Psychotropics 3  Patient Care Equipment 1  Mobility-Assistance 2  Mobility-Gait 2  Mobility-Sensory Deficit 0  Altered awareness of immediate physical environment 1  Impulsiveness 2  Lack of understanding of one's physical/cognitive limitations 4  Total Score 20  Patient Fall Risk Level High fall risk  Adult Fall Risk Interventions  Required Bundle Interventions *See Row Information* High fall risk - low, moderate, and high requirements implemented  Additional Interventions Use of appropriate toileting equipment (bedpan, BSC, etc.)  Screening for Fall Injury Risk (To be completed on HIGH fall risk patients) - Assessing Need for Floor Mats  Risk For Fall Injury- Criteria for Floor Mats Previous fall this admission  Will Implement Floor Mats Yes  Pain Assessment  Pain Scale 0-10  Pain Score 0  PCA/Epidural/Spinal Assessment  Respiratory Pattern Regular;Unlabored  Neurological  Neuro (WDL) X  Level of Consciousness Alert  Orientation Level Oriented to person;Oriented to situation   Musculoskeletal  Musculoskeletal (WDL) X  Assistive Device None  Generalized Weakness Yes  Weight Bearing Restrictions No  Integumentary  Integumentary (WDL) X  Skin Condition Dry  Pain Screening  Response to Interventions sleeping (waist restraint ordered)  Effect of Pain on Daily Activities pending  Clinical Progression Gradually improving

## 2021-01-02 NOTE — Progress Notes (Signed)
S/p fall, bed to floor no injury noted.  Post fall flow sheet initiated

## 2021-01-02 NOTE — [place] FL2 (Signed)
MEDICAID FL2 LEVEL OF CARE SCREENING TOOL     IDENTIFICATION  Patient Name: Alvin Clark Birthdate: [birth year] Sex: male Admission Date (Current Location): 12/27/2020  [facility] and [place] Number:  Producer, television/film/video and Address:  [facility],  [address], [place] [address]      Provider Number: [id number]  Attending Physician Name and Address:  Lorin Glass, MD  Relative Name and Phone Number:  Jermale, Crass [phone]  [phone]  Soledad Gerlach Other   [phone]    Current Level of Care: Hospital Recommended Level of Care: Skilled Nursing Facility Prior Approval Number:    Date Approved/Denied:   PASRR Number: [id number]  Discharge Plan: SNF    Current Diagnoses: Patient Active Problem List   Diagnosis Date Noted  . Protein-calorie malnutrition, severe 12/29/2020  . Withdrawal symptoms, alcohol (HCC) 12/28/2020  . AKI (acute kidney injury) (HCC) 12/27/2020  . Confusion 12/27/2020  . Leukocytosis 12/27/2020  . Blurry vision, bilateral 12/27/2020  . Transaminitis 12/27/2020  . Constipation 12/27/2020    Orientation RESPIRATION BLADDER Height & Weight     Self,Time,Situation,Place  Normal Incontinent Weight: 135 lb 12.9 oz (61.6 kg) Height:  5\' 9"  (175.3 cm)  BEHAVIORAL SYMPTOMS/MOOD NEUROLOGICAL BOWEL NUTRITION STATUS      Incontinent Diet (Regular)  AMBULATORY STATUS COMMUNICATION OF NEEDS Skin   Limited Assist Verbally Normal                       Personal Care Assistance Level of Assistance  Bathing,Feeding,Dressing Bathing Assistance: Limited assistance Feeding assistance: Independent Dressing Assistance: Limited assistance     Functional Limitations Info  Sight,Speech,Hearing Sight Info: Adequate Hearing Info: Adequate Speech Info: Adequate    SPECIAL CARE FACTORS FREQUENCY  PT (By licensed PT),OT (By licensed OT)     PT Frequency: Minimum 5x a week OT Frequency: Minimum 5x a week             Contractures Contractures Info: Not present    Additional Factors Info  Code Status,Allergies Code Status Info: Full Code Allergies Info: NKA           Current Medications (01/02/2021):  This is the current hospital active medication list Current Facility-Administered Medications  Medication Dose Route Frequency Provider Last Rate Last Admin  . enoxaparin (LOVENOX) injection 40 mg  40 mg Subcutaneous Q24H 01/04/2021, MD   40 mg at 01/01/21 2152  . feeding supplement (ENSURE ENLIVE / ENSURE PLUS) liquid 237 mL  237 mL Oral TID BM Dahal, 2153, MD   237 mL at 01/01/21 1638  . folic acid (FOLVITE) tablet 1 mg  1 mg Oral Daily 01/03/21, MD   1 mg at 01/01/21 1101  . haloperidol lactate (HALDOL) injection 2 mg  2 mg Intramuscular Q6H PRN 01/03/21, MD   2 mg at 01/01/21 2235  . multivitamin with minerals tablet 1 tablet  1 tablet Oral Daily 2236, MD   1 tablet at 01/01/21 1101  . phosphorus (K PHOS NEUTRAL) tablet 500 mg  500 mg Oral BID Dahal, Binaya, MD      . polyethylene glycol (MIRALAX / GLYCOLAX) packet 17 g  17 g Oral Daily 01/03/21 B, MD      . sodium chloride flush (NS) 0.9 % injection 3 mL  3 mL Intravenous Q12H Beola Cord, MD   3 mL at 01/01/21 2154  . thiamine tablet 100 mg  100 mg Oral  Daily Synetta Fail, MD   100 mg at 01/01/21 1101   Or  . thiamine (B-1) injection 100 mg  100 mg Intravenous Daily Synetta Fail, MD   100 mg at 12/31/20 1100     Discharge Medications: Please see discharge summary for a list of discharge medications.  Relevant Imaging Results:  Relevant Lab Results:   Additional Information SSN [id number]  Darleene Cleaver, LCSW

## 2021-01-02 NOTE — Progress Notes (Signed)
MD said okay to give one dose IV haldol. Patient is getting increasingly anxious/restless trying to get out of bed.

## 2021-01-02 NOTE — Progress Notes (Signed)
   01/02/21 2130  What Happened  Was fall witnessed? Yes  Who witnessed fall? Telesitter  Patients activity before fall to/from bed, chair, or stretcher  Point of contact buttocks  Was patient injured? No  Patient found on floor  Found by Staff-comment (Crystal/Alexis)  Stated prior activity to/from bed, chair, or stretcher  Follow Up  MD notified Opyd  Time MD notified 2130  Family notified No - patient refusal  Additional tests No  Progress note created (see row info) Yes  Adult Fall Risk Assessment  Risk Factor Category (scoring not indicated) Fall has occurred during this admission (document High fall risk)  Age 60  Fall History: Fall within 6 months prior to admission 5  Elimination; Bowel and/or Urine Incontinence 0  Elimination; Bowel and/or Urine Urgency/Frequency 0  Medications: includes PCA/Opiates, Anti-convulsants, Anti-hypertensives, Diuretics, Hypnotics, Laxatives, Sedatives, and Psychotropics 3  Patient Care Equipment 1  Mobility-Assistance 2  Mobility-Gait 2  Mobility-Sensory Deficit 0  Altered awareness of immediate physical environment 1  Impulsiveness 2  Lack of understanding of one's physical/cognitive limitations 4  Total Score 20  Patient Fall Risk Level High fall risk  Adult Fall Risk Interventions  Required Bundle Interventions *See Row Information* High fall risk - low, moderate, and high requirements implemented  Additional Interventions Safety Sitter/Safety Rounder  Screening for Fall Injury Risk (To be completed on HIGH fall risk patients) - Assessing Need for Floor Mats  Risk For Fall Injury- Criteria for Floor Mats Previous fall this admission  Will Implement Floor Mats Yes  Pain Assessment  Pain Scale 0-10  Pain Score 0  PCA/Epidural/Spinal Assessment  Respiratory Pattern Regular;Unlabored  Neurological  Neuro (WDL) X  Level of Consciousness Alert  Orientation Level Oriented to person  Musculoskeletal  Musculoskeletal (WDL) X  Assistive  Device None  Generalized Weakness Yes  Weight Bearing Restrictions No  Integumentary  Integumentary (WDL) X  Skin Condition Dry

## 2021-01-03 LAB — BASIC METABOLIC PANEL
Anion gap: 7 (ref 5–15)
BUN: 8 mg/dL (ref 6–20)
CO2: 29 mmol/L (ref 22–32)
Calcium: 9.5 mg/dL (ref 8.9–10.3)
Chloride: 107 mmol/L (ref 98–111)
Creatinine, Ser: 0.88 mg/dL (ref 0.61–1.24)
GFR, Estimated: >60 mL/min (ref >60)
Glucose, Bld: 97 mg/dL (ref 70–99)
Potassium: 3.9 mmol/L (ref 3.5–5.1)
Sodium: 143 mmol/L (ref 135–145)

## 2021-01-03 LAB — CBC WITH DIFFERENTIAL/PLATELET
Abs Immature Granulocytes: 0.03 10*3/uL (ref 0.00–0.07)
Basophils Absolute: 0.1 10*3/uL (ref 0.0–0.1)
Basophils Relative: 1 %
Eosinophils Absolute: 0.2 10*3/uL (ref 0.0–0.5)
Eosinophils Relative: 3 %
HCT: 32 % — ABNORMAL LOW (ref 39.0–52.0)
Hemoglobin: 10.2 g/dL — ABNORMAL LOW (ref 13.0–17.0)
Immature Granulocytes: 1 %
Lymphocytes Relative: 25 %
Lymphs Abs: 1.6 10*3/uL (ref 0.7–4.0)
MCH: 32.3 pg (ref 26.0–34.0)
MCHC: 31.9 g/dL (ref 30.0–36.0)
MCV: 101.3 fL — ABNORMAL HIGH (ref 80.0–100.0)
Monocytes Absolute: 0.7 10*3/uL (ref 0.1–1.0)
Monocytes Relative: 11 %
Neutro Abs: 3.9 10*3/uL (ref 1.7–7.7)
Neutrophils Relative %: 59 %
Platelets: 171 10*3/uL (ref 150–400)
RBC: 3.16 MIL/uL — ABNORMAL LOW (ref 4.22–5.81)
RDW: 14.6 % (ref 11.5–15.5)
WBC: 6.5 10*3/uL (ref 4.0–10.5)
nRBC: 0 % (ref 0.0–0.2)

## 2021-01-03 LAB — MAGNESIUM: Magnesium: 1.5 mg/dL — ABNORMAL LOW (ref 1.7–2.4)

## 2021-01-03 LAB — PHOSPHORUS: Phosphorus: 2.7 mg/dL (ref 2.5–4.6)

## 2021-01-03 MED ORDER — MAGNESIUM SULFATE 2 GM/50ML IV SOLN
2.0000 g | Freq: Once | INTRAVENOUS | Status: AC
  Administered 2021-01-03: 2 g via INTRAVENOUS
  Filled 2021-01-03: qty 50

## 2021-01-03 MED ORDER — QUETIAPINE FUMARATE 25 MG PO TABS
25.0000 mg | ORAL_TABLET | Freq: Two times a day (BID) | ORAL | Status: DC
  Administered 2021-01-03 – 2021-04-18 (×211): 25 mg via ORAL
  Filled 2021-01-03 (×211): qty 1

## 2021-01-03 NOTE — TOC Progression Note (Signed)
Transition of Care [facility]) - Progression Note    Patient Details  Name: Alvin Clark MRN: [id number] Date of Birth: 06/25/1961  Transition of Care [facility]) CM/SW Contact  Darleene Cleaver, [place] Phone Number: 01/03/2021, 6:49 PM  Clinical Narrative:     CSW spoke to patient's sister [name], and explained to patient that PT is recommending SNF, however because patient does not have any insurance, history of substance abuse, and does not have disability, CSW may not be able to find him a place to go to after hospital.  Per patient's sister, his daughter is in the hospital herself, and has been for over a month.  She is not able to provide any support for patient.  Patient has an 37 year old father who is ill too, and is not able to provide support for patient.  Patient also has been evicted from his home.  CSW explained to patient's sister, that if a SNF was able to accept patient, they will have to have a payor source, and family will have to pay privately while patient is waiting for Medicaid approval.  CSW provided patient's sister with DSS to apply for Medicaid, also to contact Social Security office to try to see if patient will be eligible for disability.  CSW also provided patient's sister with information about [employer] law firm for her to contact to see what options are available to help patient get either insurance or disability.  CSW informed patient's sister that there are not many options available for patient.  She stated she will try to contact DSS and disability office to see what can be done to help patient.  CSW reviewed patient's chart, and referral to [facility]bler was made for [facility] eligibility.  CSW to continue to work on trying to find placement option for patient.           Expected Discharge Plan and Services                                                 Social Determinants of Health (SDOH) Interventions    Readmission Risk Interventions No  flowsheet data found.

## 2021-01-03 NOTE — Progress Notes (Signed)
PROGRESS NOTE  Alvin Clark  DOB: 10/28/1960  PCP: Patient, No Pcp Per KPV:[id number]  DOA: 12/27/2020  LOS: 6 days   Chief Complaint  Patient presents with  . Altered Mental Status   Brief narrative: Alvin Clark is a 60 y.o. male with PMH significant for chronic alcoholism.   3/9, patient was trying to enter the home that he was evicted from.  Homeowners called [place] PD found him frail and disoriented.  EMS was called in. EMS called patient's family who stated that patient had concerns for chronic alcoholism and developing memory issues for last 1 to 2 years.   In the ED, patient was afebrile, slightly tachycardic, blood pressure stable. Labs showed BUN/creatinine 64/1.83, from a baseline of normal 3 years ago.  AST/ALT elevated to 129/113, WC count slightly elevated 12.4. Blood alcohol level was negative.  Urine drug screen was negative urinalysis showed hazy yellow urine. CT scan of the head showed generalized volume loss advanced for his age, without acute abnormality. CT cervical spine showed advanced cervical spine degeneration with suspected multilevel mild spinal stenosis. Chest x-ray unremarkable. Patient was admitted to hospital service.  Subjective: Patient was seen and examined this morning. At the time of my evaluation, he was alert, awake, oriented to place.  Not restless or agitated.  Sitter was at bedside.  Assessment/Plan: Acute metabolic encephalopathy Chronic evolving memory issues Chronic alcoholism  -Patient has chronic alcoholism.  Family reports worsening memory issues for last 1 to 2 years.  CT scan of head showed generalized volume loss advanced for his age. -Brought in for disorientation, abnormal behavior.  Dehydration might have precipitated an acute delirium and disorientation.  Blood alcohol level negative.  UDS normal. -Mental status has been waxing and waning.  He has intermittent episodes of delirium, agitation. -Tends to remain calm in the  morning and starts getting agitated in the afternoon -Started him on Seroquel 25 mg twice daily today.  Impaired mobility -PT eval obtained.  SNF recommended.  AKI -Baseline creatinine less than 1. -Creatinine elevated to 1.83 on admission.  Improved back to normal with hydration.  Hypokalemia/hypomagnesemia/hypophosphatemia -Labs this morning with magnesium level low at 1.5.  IV replacement ordered.   Recent Labs  Lab 12/27/20 1700 12/28/20 0614 12/28/20 1158 12/29/20 0359 12/30/20 0437 12/31/20 0430 01/01/21 0331 01/03/21 0348  K  --    < > 3.5 3.7 3.1* 3.6 3.9 3.9  MG 2.8*  --  2.4 2.4  --  1.8  --  1.5*  PHOS 6.3*  --  2.9 2.4*  --  1.6* 2.1* 2.7   < > = values in this interval not displayed.   Transaminitis  -Probably due to chronic liver disease with alcoholism.  Liver enzymes overall improving.  Continue to monitor. Recent Labs  Lab 12/28/20 0614 12/28/20 1158 12/30/20 0437 12/31/20 0430 01/01/21 0331  AST 97* 103* 63* 52* 65*  ALT 89* 94* 59* 49* 50*  ALKPHOS 69 71 53 50 55  BILITOT 1.3* 1.4* 0.8 0.9 1.1  PROT 7.2 7.6 5.6* 5.4* 5.6*  ALBUMIN 3.7 3.8 2.9* 2.7* 2.8*   Constipation -Senokot, MiraLAX as needed  Mobility: Encourage ambulation.  PT eval obtained.  SNF recommended Code Status:   Code Status: Full Code  Nutritional status: Body mass index is 20.05 kg/m. Nutrition Problem: Severe Malnutrition Etiology: chronic illness (chronic alcoholism) Signs/Symptoms: severe fat depletion,severe muscle depletion Diet Order            Diet heart healthy/carb modified Room  service appropriate? Yes; Fluid consistency: Thin  Diet effective now                 DVT prophylaxis: enoxaparin (LOVENOX) injection 40 mg Start: 12/27/20 2200   Antimicrobials:  None Fluid: Not on IV fluid Consultants: None Family Communication:  None at bedside  Status is: Inpatient  Remains inpatient appropriate because: Pending SNF Dispo: The patient is from: Home               Anticipated d/c is to: SNF when bed is available              Patient currently is medically stable to d/c.   Difficult to place patient No  Infusions:    Scheduled Meds: . enoxaparin (LOVENOX) injection  40 mg Subcutaneous Q24H  . feeding supplement  237 mL Oral TID BM  . folic acid  1 mg Oral Daily  . multivitamin with minerals  1 tablet Oral Daily  . phosphorus  500 mg Oral BID  . polyethylene glycol  17 g Oral Daily  . QUEtiapine  25 mg Oral BID  . sodium chloride flush  3 mL Intravenous Q12H  . thiamine  100 mg Oral Daily   Or  . thiamine  100 mg Intravenous Daily    Antimicrobials: Anti-infectives (From admission, onward)   None      PRN meds: haloperidol lactate   Objective: Vitals:   01/02/21 2337 01/03/21 0350  BP: 113/84 104/62  Pulse: 95 88  Resp: 16 18  Temp: 98.7 F (37.1 C) 98.8 F (37.1 C)  SpO2: 98% 99%    Intake/Output Summary (Last 24 hours) at 01/03/2021 1401 Last data filed at 01/03/2021 1328 Gross per 24 hour  Intake 952 ml  Output --  Net 952 ml   Filed Weights   12/31/20 0413 01/01/21 0448 01/02/21 0500  Weight: 60 kg 62.6 kg 61.6 kg   Weight change:  Body mass index is 20.05 kg/m.   Physical Exam: General exam: Middle-aged African-American male, propped up in bed.  Not in physical pain Skin: No rashes, lesions or ulcers. HEENT: Atraumatic, normocephalic, no obvious bleeding Lungs: Clear to auscultation bilaterally. CVS: Regular rate and rhythm, no murmur GI/Abd soft, nontender, nondistended, bowel sound present CNS: Alert, awake, not agitated at the time of my evaluation.  Oriented to place only  psychiatry: Mood appropriate Extremities: No pedal edema, no calf tenderness  Data Review: I have personally reviewed the laboratory data and studies available.  Recent Labs  Lab 12/27/20 1634 12/28/20 1158 12/29/20 0359 12/30/20 0437 12/31/20 0430 01/03/21 0348  WBC 12.4* 12.7* 9.0 7.8 7.9 6.5  NEUTROABS 9.3*  --   5.8 5.3 5.4 3.9  HGB 15.7 15.7 11.9* 11.1* 10.3* 10.2*  HCT 48.4 48.3 36.9* 33.9* 32.2* 32.0*  MCV 99.4 100.2* 101.4* 100.0 101.3* 101.3*  PLT 298 277 213 182 168 171   Recent Labs  Lab 12/27/20 1700 12/28/20 0614 12/28/20 1158 12/29/20 0359 12/30/20 0437 12/31/20 0430 01/01/21 0331 01/03/21 0348  NA  --    < > 146* 144 142 140 142 143  K  --    < > 3.5 3.7 3.1* 3.6 3.9 3.9  CL  --    < > 111 113* 112* 112* 110 107  CO2  --    < > 23 23 23 23 24 29   GLUCOSE  --    < > 133* 129* 101* 114* 135* 97  BUN  --    < >  43* 27* 13 9 8 8   CREATININE  --    < > 1.29* 1.12 0.88 0.88 0.94 0.88  CALCIUM  --    < > 9.6 8.7* 8.4* 8.5* 8.8* 9.5  MG 2.8*  --  2.4 2.4  --  1.8  --  1.5*  PHOS 6.3*  --  2.9 2.4*  --  1.6* 2.1* 2.7   < > = values in this interval not displayed.    F/u labs ordered Unresulted Labs (From admission, onward)          Start     Ordered   01/03/21 0500  Creatinine, serum  (enoxaparin (LOVENOX)    CrCl >/= 30 ml/min)  Weekly,   R     Comments: while on enoxaparin therapy    12/27/20 2008          Signed, 2009, MD [facility] 01/03/2021

## 2021-01-03 NOTE — Progress Notes (Signed)
Physical Therapy Treatment Patient Details Name: Alvin Clark MRN: [id number] DOB: December 30, 1960 Today's Date: 01/03/2021    History of Present Illness Pt admitted ono 12/27/20 with Acute metabolic encephalopathy, Chronic evolving memory issues, Chronic alcoholism. He had a fall out of bed on 3/11.    PT Comments    Pt tolerates BLE strengthening exercises, using RW in standing to steady self. Pt with hip extension last when rising to stand and noted to flex trunk in half before shifting weight back to return to sitting despite multimodal cues. Pt with minor LOB requiring min A to maintain upright trunk while releasing both hands to don facemask in standing. Pt ambualtes using RW, veers R/L with head turns in simultaneous direction, min A to maneuver RW appropriately with turns, multimodal cues for maintaining body within RW frame, and intermittent scissoring requiring assist to correct.  Pt pleasant throughout session and motivated, disoriented to year and situation. Will continue to progress acute PT as able.  Follow Up Recommendations  SNF     Equipment Recommendations  Rolling walker with 5" wheels    Recommendations for Other Services       Precautions / Restrictions Precautions Precautions: Fall Restrictions Weight Bearing Restrictions: No    Mobility  Bed Mobility Overal bed mobility: Needs Assistance Bed Mobility: Supine to Sit;Sit to Supine  Supine to sit: Supervision Sit to supine: Supervision   General bed mobility comments: supervision for safety    Transfers Overall transfer level: Needs assistance Equipment used: Rolling walker (2 wheeled) Transfers: Sit to/from Stand Sit to Stand: Min guard;From elevated surface    General transfer comment: cues to push from seated surface, hip extension noted last with rising, flexs trunk down to RW when return to sitting despite cues to shift weight posterior  Ambulation/Gait Ambulation/Gait assistance: Min assist;Min  guard Gait Distance (Feet): 200 Feet Assistive device: Rolling walker (2 wheeled) Gait Pattern/deviations: Decreased stride length;Shuffle;Trunk flexed;Scissoring Gait velocity: decreased   General Gait Details: pt with LOB with head turns R/L veering to direction he looks, min A to maneuver RW appropriately with turns, multimodal cues to maintain body within RW frame, min A with turns due to easy LOB and intermittent scissoring requiring assist to correct and widen BOS   Stairs      Wheelchair Mobility    Modified Rankin (Stroke Patients Only)       Balance Overall balance assessment: Needs assistance;History of Falls Sitting-balance support: Feet supported Sitting balance-Leahy Scale: Good Sitting balance - Comments: seated EOB   Standing balance support: Bilateral upper extremity supported;No upper extremity supported Standing balance-Leahy Scale: Poor Standing balance comment: BUE reliant on support, occasional min A       Cognition Arousal/Alertness: Awake/alert Behavior During Therapy: WFL for tasks assessed/performed;Impulsive Overall Cognitive Status: No family/caregiver present to determine baseline cognitive functioning  Orientation Level: Disoriented to;Time;Situation   Safety/Judgement: Decreased awareness of safety   Problem Solving: Requires verbal cues;Requires tactile cues General Comments: Pt states year is 2018 and Danae Orleans is president. Pt slightly impulsive initially, improves with repeated cues for safety and mobility      Exercises General Exercises - Lower Extremity Long Arc Quad: Seated;AROM;Strengthening;Both;20 reps Hip Flexion/Marching: Standing;AROM;Strengthening;Both;10 reps (RW for steadying) Heel Raises: Standing;AROM;Strengthening;Both;10 reps (RW for steadying)    General Comments        Pertinent Vitals/Pain Pain Assessment: No/denies pain    Home Living  Prior Function            PT Goals  (current goals can now be found in the care plan section) Acute Rehab PT Goals Patient Stated Goal: Agreeable to work with PT PT Goal Formulation: With patient Time For Goal Achievement: 01/13/21 Potential to Achieve Goals: Good Progress towards PT goals: Progressing toward goals    Frequency    Min 3X/week      PT Plan Current plan remains appropriate    Co-evaluation              AM-PAC PT "6 Clicks" Mobility   Outcome Measure  Help needed turning from your back to your side while in a flat bed without using bedrails?: A Little Help needed moving from lying on your back to sitting on the side of a flat bed without using bedrails?: A Little Help needed moving to and from a bed to a chair (including a wheelchair)?: A Little Help needed standing up from a chair using your arms (e.g., wheelchair or bedside chair)?: A Little Help needed to walk in hospital room?: A Little Help needed climbing 3-5 steps with a railing? : A Lot 6 Click Score: 17    End of Session Equipment Utilized During Treatment: Gait belt Activity Tolerance: Patient tolerated treatment well Patient left: in bed;with call bell/phone within reach;with nursing/sitter in room Nurse Communication: Mobility status PT Visit Diagnosis: Unsteadiness on feet (R26.81);Other abnormalities of gait and mobility (R26.89);History of falling (Z91.81)     Time: [id number] PT Time Calculation (min) (ACUTE ONLY): 18 min  Charges:  $Gait Training: 8-22 mins                      Tori Ziara Thelander PT, DPT 01/03/21, 12:34 PM

## 2021-01-04 ENCOUNTER — Other Ambulatory Visit: Payer: Self-pay

## 2021-01-04 MED ORDER — MAGNESIUM SULFATE 2 GM/50ML IV SOLN
2.0000 g | Freq: Once | INTRAVENOUS | Status: AC
  Administered 2021-01-04: 2 g via INTRAVENOUS
  Filled 2021-01-04: qty 50

## 2021-01-04 MED ORDER — SODIUM CHLORIDE 0.9 % IV SOLN: INTRAVENOUS | Status: DC

## 2021-01-04 NOTE — Progress Notes (Signed)
PROGRESS NOTE  Alvin Clark  DOB: Feb 08, 1961  PCP: Patient, No Pcp Per UQJ:[id number]  DOA: 12/27/2020  LOS: 7 days   Chief Complaint  Patient presents with  . Altered Mental Status   Brief narrative: Alvin Clark is a 60 y.o. male with PMH significant for chronic alcoholism.   3/9, patient was trying to enter the home that he was evicted from. Homeowners called [place] [employer] found him frail and disoriented.  EMS was called in. EMS called patient's family who stated that patient had concerns for chronic alcoholism and developing memory issues for last 1 to 2 years.   In the ED, patient was afebrile, slightly tachycardic, blood pressure stable. Labs showed BUN/creatinine 64/1.83, from a baseline of normal 3 years ago.  AST/ALT elevated to 129/113, WC count slightly elevated 12.4. Blood alcohol level was negative.  Urine drug screen was negative urinalysis showed hazy yellow urine. CT scan of the head showed generalized volume loss advanced for his age, without acute abnormality. CT cervical spine showed advanced cervical spine degeneration with suspected multilevel mild spinal stenosis. Chest x-ray unremarkable. Patient was admitted to hospital service.  Subjective: Patient was seen and examined this morning.  Pleasant, able to have a conversation.  Oriented to place only Sitter at place.  Per sitter, he is very weak, not even able to stand up by himself when he tries to  Assessment/Plan: Acute metabolic encephalopathy Chronic evolving memory issues Chronic alcoholism  -Patient has chronic alcoholism.  Family reports worsening memory issues for last 1 to 2 years.  CT scan of head showed generalized volume loss advanced for his age. -Brought in for disorientation, abnormal behavior.  Dehydration might have precipitated an acute delirium and disorientation.  Blood alcohol level negative.  UDS normal. -Mental status has been waxing and waning.  He has intermittent episodes of delirium,  agitation. -Tends to remain calm in the morning and starts getting agitated in the afternoon -Continue Seroquel 25 mg daily.  Impaired mobility -PT eval obtained.  SNF recommended.  AKI -Baseline creatinine less than 1. -Creatinine elevated to 1.83 on admission.  Improved back to normal with hydration.  Hypokalemia/hypomagnesemia/hypophosphatemia -Replacement given.  Repeat tomorrow. Recent Labs  Lab 12/29/20 0359 12/30/20 0437 12/31/20 0430 01/01/21 0331 01/03/21 0348  K 3.7 3.1* 3.6 3.9 3.9  MG 2.4  --  1.8  --  1.5*  PHOS 2.4*  --  1.6* 2.1* 2.7   Transaminitis  -Probably due to chronic liver disease with alcoholism.  Liver enzymes overall improving.  Continue to monitor. Recent Labs  Lab 12/30/20 0437 12/31/20 0430 01/01/21 0331  AST 63* 52* 65*  ALT 59* 49* 50*  ALKPHOS 53 50 55  BILITOT 0.8 0.9 1.1  PROT 5.6* 5.4* 5.6*  ALBUMIN 2.9* 2.7* 2.8*   Severe malnutrition -Severe Malnutrition related to chronic illness (chronic alcoholism) as evidenced by severe fat depletion,severe muscle depletion.  Constipation -Senokot, MiraLAX as needed  Mobility: Encourage ambulation.  PT eval obtained.  SNF recommended Code Status:   Code Status: Full Code  Nutritional status: Body mass index is 20.32 kg/m. Nutrition Problem: Severe Malnutrition Etiology: chronic illness (chronic alcoholism) Signs/Symptoms: severe fat depletion,severe muscle depletion Diet Order            Diet heart healthy/carb modified Room service appropriate? Yes; Fluid consistency: Thin  Diet effective now                 DVT prophylaxis: enoxaparin (LOVENOX) injection 40 mg Start: 12/27/20 2200  Antimicrobials:  None Fluid: Not on IV fluid Consultants: None Family Communication:  None at bedside  Status is: Inpatient  Remains inpatient appropriate because: Pending SNF Dispo: The patient is from: Home              Anticipated d/c is to: SNF when bed is available              Patient  currently is medically stable to d/c.   Difficult to place patient No  Infusions:  . magnesium sulfate bolus IVPB 2 g (01/04/21 1306)    Scheduled Meds: . enoxaparin (LOVENOX) injection  40 mg Subcutaneous Q24H  . feeding supplement  237 mL Oral TID BM  . folic acid  1 mg Oral Daily  . multivitamin with minerals  1 tablet Oral Daily  . phosphorus  500 mg Oral BID  . polyethylene glycol  17 g Oral Daily  . QUEtiapine  25 mg Oral BID  . sodium chloride flush  3 mL Intravenous Q12H  . thiamine  100 mg Oral Daily   Or  . thiamine  100 mg Intravenous Daily    Antimicrobials: Anti-infectives (From admission, onward)   None      PRN meds: haloperidol lactate   Objective: Vitals:   01/03/21 2054 01/04/21 0533  BP: 109/65 111/63  Pulse: 85 77  Resp: 16 16  Temp: 98 F (36.7 C) 97.8 F (36.6 C)  SpO2: 100% 100%    Intake/Output Summary (Last 24 hours) at 01/04/2021 1308 Last data filed at 01/04/2021 1306 Gross per 24 hour  Intake 1080 ml  Output --  Net 1080 ml   Filed Weights   01/01/21 0448 01/02/21 0500 01/04/21 0533  Weight: 62.6 kg 61.6 kg 62.4 kg   Weight change:  Body mass index is 20.32 kg/m.   Physical Exam: General exam: Middle-aged African-American male, propped up in bed.  Not in physical pain Skin: No rashes, lesions or ulcers. HEENT: Atraumatic, normocephalic, no obvious bleeding Lungs: Clear to auscultation bilaterally. CVS: Regular rate and rhythm, no murmur GI/Abd soft, nontender, nondistended, bowel sound present CNS: Alert, awake, not agitated at the time of my evaluation.  Oriented to place only  psychiatry: Mood appropriate Extremities: No pedal edema, no calf tenderness  Data Review: I have personally reviewed the laboratory data and studies available.  Recent Labs  Lab 12/29/20 0359 12/30/20 0437 12/31/20 0430 01/03/21 0348  WBC 9.0 7.8 7.9 6.5  NEUTROABS 5.8 5.3 5.4 3.9  HGB 11.9* 11.1* 10.3* 10.2*  HCT 36.9* 33.9* 32.2* 32.0*   MCV 101.4* 100.0 101.3* 101.3*  PLT 213 182 168 171   Recent Labs  Lab 12/29/20 0359 12/30/20 0437 12/31/20 0430 01/01/21 0331 01/03/21 0348  NA 144 142 140 142 143  K 3.7 3.1* 3.6 3.9 3.9  CL 113* 112* 112* 110 107  CO2 23 23 23 24 29   GLUCOSE 129* 101* 114* 135* 97  BUN 27* 13 9 8 8   CREATININE 1.12 0.88 0.88 0.94 0.88  CALCIUM 8.7* 8.4* 8.5* 8.8* 9.5  MG 2.4  --  1.8  --  1.5*  PHOS 2.4*  --  1.6* 2.1* 2.7    F/u labs ordered Unresulted Labs (From admission, onward)          Start     Ordered   01/03/21 0500  Creatinine, serum  (enoxaparin (LOVENOX)    CrCl >/= 30 ml/min)  Weekly,   R     Comments: while on enoxaparin therapy  12/27/20 2008          SignedLorin Glass, MD [facility] 01/04/2021

## 2021-01-04 NOTE — Progress Notes (Addendum)
Physical Therapy Treatment Patient Details Name: Alvin Clark MRN: [id number] DOB: Oct 19, 1961 Today's Date: 01/04/2021    History of Present Illness Pt admitted ono 12/27/20 with Acute metabolic encephalopathy, Chronic evolving memory issues, Chronic alcoholism. He had a fall out of bed on 3/11.    PT Comments    Pt ambulates in hallway with RW, narrow BOS, attempts to stand too far behind or to the side of RW requiring repositioning to improve safety. Pt with quick speed when turning causing mild LOB and min A from therapist to recover. Pt easily distracted, noted to veer towards object he is distracted by. Added speed changes and sudden stop to gait training with fair balance, pt able to perform without LOB while using RW. Pt attempts to ambulate in room without RW, reaching for furniture and walls to steady self with trunk flexed significantly forward despite therapist and sitter cueing pt for safety. Pt pleasant throughout session, continues to be disoriented to year and president.   Follow Up Recommendations  SNF     Equipment Recommendations  Rolling walker with 5" wheels    Recommendations for Other Services       Precautions / Restrictions Precautions Precautions: Fall Restrictions Weight Bearing Restrictions: No    Mobility  Bed Mobility Overal bed mobility: Needs Assistance Bed Mobility: Supine to Sit;Sit to Supine  Supine to sit: Supervision Sit to supine: Supervision   General bed mobility comments: supervision for safety    Transfers Overall transfer level: Needs assistance Equipment used: Rolling walker (2 wheeled) Transfers: Sit to/from Stand Sit to Stand: Supervision    General transfer comment: pt with trunk flexed forward while rising and lowering despite cues for upright posture, cues for hand placement on bed to power up  Ambulation/Gait Ambulation/Gait assistance: Min assist;Min guard Gait Distance (Feet): 200 Feet Assistive device: Rolling walker  (2 wheeled) Gait Pattern/deviations: Decreased stride length;Shuffle;Trunk flexed;Scissoring;Narrow base of support Gait velocity: decreased   General Gait Details: pt with narrow BOS, easily distracted and veers towards object that catches his attention, quick speed with turns requiring min A to prevent LOB due to narrow BOS, pt requires stopping and repositioning within RW frame due to standing too far behind or to the side. Pt releases RW once in room and ambulates to restroom with min A, hands reaching out for furniture and walls, trunk flexed significantly forward despite education from therapist, sitter in room also trying to cue pt for upright posture and RW use   Stairs             Wheelchair Mobility    Modified Rankin (Stroke Patients Only)       Balance Overall balance assessment: Needs assistance;History of Falls Sitting-balance support: Feet supported Sitting balance-Leahy Scale: Good Sitting balance - Comments: seated EOB   Standing balance support: Bilateral upper extremity supported;No upper extremity supported Standing balance-Leahy Scale: Poor Standing balance comment: BUE reliant on support with occasional min A       Cognition Arousal/Alertness: Awake/alert Behavior During Therapy: WFL for tasks assessed/performed;Impulsive Overall Cognitive Status: No family/caregiver present to determine baseline cognitive functioning Area of Impairment: Orientation;Safety/judgement;Problem solving  Orientation Level: Disoriented to;Time;Situation  Safety/Judgement: Decreased awareness of safety   Problem Solving: Requires verbal cues;Requires tactile cues General Comments: Pt pleasant, still disoriented to year. Pt impulsive, improves with cues for safety.      Exercises      General Comments        Pertinent Vitals/Pain Pain Assessment: No/denies pain  Home Living                      Prior Function            PT Goals (current goals can  now be found in the care plan section) Acute Rehab PT Goals Patient Stated Goal: Agreeable to work with PT PT Goal Formulation: With patient Time For Goal Achievement: 01/13/21 Potential to Achieve Goals: Good Progress towards PT goals: Progressing toward goals    Frequency    Min 3X/week      PT Plan Current plan remains appropriate    Co-evaluation              AM-PAC PT "6 Clicks" Mobility   Outcome Measure  Help needed turning from your back to your side while in a flat bed without using bedrails?: None Help needed moving from lying on your back to sitting on the side of a flat bed without using bedrails?: None Help needed moving to and from a bed to a chair (including a wheelchair)?: A Little Help needed standing up from a chair using your arms (e.g., wheelchair or bedside chair)?: A Little Help needed to walk in hospital room?: A Little Help needed climbing 3-5 steps with a railing? : A Lot 6 Click Score: 19    End of Session Equipment Utilized During Treatment: Gait belt Activity Tolerance: Patient tolerated treatment well Patient left: in bed;with call bell/phone within reach;with nursing/sitter in room Nurse Communication: Mobility status PT Visit Diagnosis: Unsteadiness on feet (R26.81);Other abnormalities of gait and mobility (R26.89);History of falling (Z91.81)     Time: 3151-7616 PT Time Calculation (min) (ACUTE ONLY): 15 min  Charges:  $Gait Training: 8-22 mins                      Tori Kaitlyne Friedhoff PT, DPT 01/04/21, 1:12 PM

## 2021-01-04 NOTE — Progress Notes (Signed)
Nutrition Follow-up  DOCUMENTATION CODES:   Severe malnutrition in context of chronic illness,Underweight  INTERVENTION:   -Ensure Enlive po TID, each supplement provides 350 kcal and 20 grams of protein  -Multivitamin with minerals daily  NUTRITION DIAGNOSIS:   Severe Malnutrition related to chronic illness (chronic alcoholism) as evidenced by severe fat depletion,severe muscle depletion.  Ongoing.  GOAL:   Patient will meet greater than or equal to 90% of their needs  Progressing.  MONITOR:   PO intake,Supplement acceptance,Labs,Weight trends,I & O's  ASSESSMENT:   60 y.o. male with PMH significant for chronic alcoholism.    3/9, patient was trying to enter the home that he was evicted from.  Homeowners called [place] [employer] found him frail and disoriented.  EMS was called in. EMS called patient's family who stated that patient had concerns for chronic alcoholism and developing memory issues for last 1 to 2 years. Admitted for acute metabolic encephalopathy.  Patient currently alert/oriented x 1.  Consuming 100% of meals at this time. Accepting Ensure supplements.  Admission weight: 123 lbs. Current weight: 137 lbs.  Medications: Folic acid, Multivitamin with minerals daily, K Phos, Miralax,  Thiamine, IV Mg sulfate  Labs reviewed: Low Mg  Diet Order:   Diet Order            Diet heart healthy/carb modified Room service appropriate? Yes; Fluid consistency: Thin  Diet effective now                 EDUCATION NEEDS:   No education needs have been identified at this time  Skin:  Skin Assessment: Reviewed RN Assessment  Last BM:  3/15  Height:   Ht Readings from Last 1 Encounters:  12/27/20 5\' 9"  (1.753 m)    Weight:   Wt Readings from Last 1 Encounters:  01/04/21 62.4 kg    BMI:  Body mass index is 20.32 kg/m.  Estimated Nutritional Needs:   Kcal:  2150-2350  Protein:  85-105g  Fluid:  2.1L/day  09-28-1990, MS, RD, LDN Inpatient  Clinical Dietitian Contact information available via Amion

## 2021-01-05 DIAGNOSIS — R7401 Elevation of levels of liver transaminase levels: Secondary | ICD-10-CM

## 2021-01-05 DIAGNOSIS — F10230 Alcohol dependence with withdrawal, uncomplicated: Secondary | ICD-10-CM

## 2021-01-05 DIAGNOSIS — E43 Unspecified severe protein-calorie malnutrition: Secondary | ICD-10-CM

## 2021-01-05 NOTE — Progress Notes (Signed)
PROGRESS NOTE    TAHEEM FRICKE  MLY:[id number] DOB: Dec 20, 1960 DOA: 12/27/2020 PCP: Patient, No Pcp Per     Brief Narrative:  JAILYN LANGHORST is a 60 y.o. BM PMHx Chronic ETOH     3/9, patient was trying to enter the home that he was evicted from. Homeowners called [place] [employer] found him frail and disoriented.  EMS was called in. EMS called patient's family who stated that patient had concerns for chronic alcoholism and developing memory issues for last 1 to 2 years.   In the ED, patient was afebrile, slightly tachycardic, blood pressure stable. Labs showed BUN/creatinine 64/1.83, from a baseline of normal 3 years ago.  AST/ALT elevated to 129/113, WC count slightly elevated 12.4. Blood alcohol level was negative.  Urine drug screen was negative urinalysis showed hazy yellow urine. CT scan of the head showed generalized volume loss advanced for his age, without acute abnormality. CT cervical spine showed advanced cervical spine degeneration with suspected multilevel mild spinal stenosis. Chest x-ray unremarkable. Patient was admitted to hospital service.   Subjective: Afebrile overnight, A/O x2 (does not know where, why).  Follows commands.  Pleasant   Assessment & Plan: Covid vaccination; unvaccinated   Principal Problem:   AKI (acute kidney injury) (HCC) Active Problems:   Confusion   Leukocytosis   Blurry vision, bilateral   Transaminitis   Constipation   Withdrawal symptoms, alcohol (HCC)   Protein-calorie malnutrition, severe  Acute metabolic Encephalopathy -Multifactorial EtOH abuse, malnutrition. -Treat underlying causes -Unsure of baseline. -Per EMR family reported memory issues for last 1 to 2 years. -CT scan head negative acute abnormality see results below -3/10 UDS negative -Per staff intermittent episodes of agitation, delirium -Seroquel 25 mg daily  Chronic EtOH abuse -Continue folic acid 1 mg daily -Thiamine 100 mg daily  Anemia unspecified -Anemia  panel pending -Occult blood pending   AKI (baseline Cr<1) Lab Results  Component Value Date   CREATININE 0.88 01/03/2021   CREATININE 0.94 01/01/2021   CREATININE 0.88 12/31/2020   CREATININE 0.88 12/30/2020   CREATININE 1.12 12/29/2020  -At baseline -Continue normal saline 75ml/hr  Hypokalemia -Potassium goal> 4 -Trend M/W/F  Hypomagnesmia -Magnesium goal> 2 -Trend M/W/F  Hypophosphatemia -Phosphorus goal> 2.5 -Trend M/W/F  Impaired mobility -PT recommends SNF -Ambulate patient every shift  Transaminitis -Most likely secondary to EtOH abuse will follow M/W/F  Severe malnutrition -Severe Malnutritionrelated to chronic illness (chronic alcoholism)as evidenced by severe fat depletion,severe muscle depletion. -Encourage patient to to eat -Ensure TID  Constipation -Senokot, MiraLAX as needed   DVT prophylaxis: Lovenox Code Status: Full Family Communication:  Status is: Inpatient    Dispo: The patient is from: Home              Anticipated d/c is to: SNF              Anticipated d/c date is:??              Patient currently unstable      Consultants:    Procedures/Significant Events:  3/10 CT head W0 contrast; no acute abnormality: Advanced generalized cerebral volume loss, white matter changes. 3/10 CT C-spine W0 contrast; advanced C-spine degeneration with suspected multilevel mild spinal stenosis   I have personally reviewed and interpreted all radiology studies and my findings are as above.  VENTILATOR SETTINGS:    Cultures   Antimicrobials:    Devices    LINES / TUBES:      Continuous Infusions: . sodium chloride 75 mL/hr  at 01/04/21 2354     Objective: Vitals:   01/04/21 0533 01/04/21 1333 01/04/21 2010 01/05/21 0509  BP: 111/63 91/63 114/70 114/86  Pulse: 77 99 97 99  Resp: 16 14 18 15   Temp: 97.8 F (36.6 C) 98.5 F (36.9 C) 97.9 F (36.6 C) 97.7 F (36.5 C)  TempSrc: Oral  Oral Oral  SpO2: 100% 100% 100%  98%  Weight: 62.4 kg   58.3 kg  Height:        Intake/Output Summary (Last 24 hours) at 01/05/2021 1052 Last data filed at 01/05/2021 1012 Gross per 24 hour  Intake 1370 ml  Output 450 ml  Net 920 ml   Filed Weights   01/02/21 0500 01/04/21 0533 01/05/21 0509  Weight: 61.6 kg 62.4 kg 58.3 kg    Examination:  General: A/O x2 (does not know where, why), No acute respiratory distress, cachectic Eyes: negative scleral hemorrhage, negative anisocoria, negative icterus ENT: Negative Runny nose, negative gingival bleeding, Neck:  Negative scars, masses, torticollis, lymphadenopathy, JVD Lungs: Clear to auscultation bilaterally without wheezes or crackles Cardiovascular: Regular rate and rhythm without murmur gallop or rub normal S1 and S2 Abdomen: negative abdominal pain, nondistended, positive soft, bowel sounds, no rebound, no ascites, no appreciable mass Extremities: No significant cyanosis, clubbing, or edema bilateral lower extremities Skin: Negative rashes, lesions, ulcers Psychiatric:  Negative depression, negative anxiety, negative fatigue, negative mania  Central nervous system:  Cranial nerves II through XII intact, tongue/uvula midline, all extremities muscle strength 5/5, sensation intact throughout, negative dysarthria, negative expressive aphasia, negative receptive aphasia.  .     Data Reviewed: Care during the described time interval was provided by me .  I have reviewed this patient's available data, including medical history, events of note, physical examination, and all test results as part of my evaluation.  CBC: Recent Labs  Lab 12/30/20 0437 12/31/20 0430 01/03/21 0348  WBC 7.8 7.9 6.5  NEUTROABS 5.3 5.4 3.9  HGB 11.1* 10.3* 10.2*  HCT 33.9* 32.2* 32.0*  MCV 100.0 101.3* 101.3*  PLT 182 168 171   Basic Metabolic Panel: Recent Labs  Lab 12/30/20 0437 12/31/20 0430 01/01/21 0331 01/03/21 0348  NA 142 140 142 143  K 3.1* 3.6 3.9 3.9  CL 112* 112*  110 107  CO2 23 23 24 29   GLUCOSE 101* 114* 135* 97  BUN 13 9 8 8   CREATININE 0.88 0.88 0.94 0.88  CALCIUM 8.4* 8.5* 8.8* 9.5  MG  --  1.8  --  1.5*  PHOS  --  1.6* 2.1* 2.7   GFR: Estimated Creatinine Clearance: 75.5 mL/min (by C-G formula based on SCr of 0.88 mg/dL). Liver Function Tests: Recent Labs  Lab 12/30/20 0437 12/31/20 0430 01/01/21 0331  AST 63* 52* 65*  ALT 59* 49* 50*  ALKPHOS 53 50 55  BILITOT 0.8 0.9 1.1  PROT 5.6* 5.4* 5.6*  ALBUMIN 2.9* 2.7* 2.8*   No results for input(s): LIPASE, AMYLASE in the last 168 hours. No results for input(s): AMMONIA in the last 168 hours. Coagulation Profile: No results for input(s): INR, PROTIME in the last 168 hours. Cardiac Enzymes: No results for input(s): CKTOTAL, CKMB, CKMBINDEX, TROPONINI in the last 168 hours. BNP (last 3 results) No results for input(s): PROBNP in the last 8760 hours. HbA1C: No results for input(s): HGBA1C in the last 72 hours. CBG: No results for input(s): GLUCAP in the last 168 hours. Lipid Profile: No results for input(s): CHOL, HDL, LDLCALC, TRIG, CHOLHDL, LDLDIRECT in the  last 72 hours. Thyroid Function Tests: No results for input(s): TSH, T4TOTAL, FREET4, T3FREE, THYROIDAB in the last 72 hours. Anemia Panel: No results for input(s): VITAMINB12, FOLATE, FERRITIN, TIBC, IRON, RETICCTPCT in the last 72 hours. Sepsis Labs: No results for input(s): PROCALCITON, LATICACIDVEN in the last 168 hours.  Recent Results (from the past 240 hour(s))  Resp Panel by RT-PCR (Flu A&B, Covid) Nasopharyngeal Swab     Status: None   Collection Time: 12/28/20  4:43 AM   Specimen: Nasopharyngeal Swab; Nasopharyngeal(NP) swabs in vial transport medium  Result Value Ref Range Status   SARS Coronavirus 2 by RT PCR NEGATIVE NEGATIVE Final    Comment: (NOTE) SARS-CoV-2 target nucleic acids are NOT DETECTED.  The SARS-CoV-2 RNA is generally detectable in upper respiratory specimens during the acute phase of  infection. The lowest concentration of SARS-CoV-2 viral copies this assay can detect is 138 copies/mL. A negative result does not preclude SARS-Cov-2 infection and should not be used as the sole basis for treatment or other patient management decisions. A negative result may occur with  improper specimen collection/handling, submission of specimen other than nasopharyngeal swab, presence of viral mutation(s) within the areas targeted by this assay, and inadequate number of viral copies(<138 copies/mL). A negative result must be combined with clinical observations, patient history, and epidemiological information. The expected result is Negative.  Fact Sheet for Patients:  BloggerCourse.comhttps://www.fda.gov/media/152166/download  Fact Sheet for Healthcare Providers:  SeriousBroker.ithttps://www.fda.gov/media/152162/download  This test is no t yet approved or cleared by the [place] FDA and  has been authorized for detection and/or diagnosis of SARS-CoV-2 by FDA under an Emergency Use Authorization (EUA). This EUA will remain  in effect (meaning this test can be used) for the duration of the COVID-19 declaration under Section 564(b)(1) of the Act, 21 U.S.C.section 360bbb-3(b)(1), unless the authorization is terminated  or revoked sooner.       Influenza A by PCR NEGATIVE NEGATIVE Final   Influenza B by PCR NEGATIVE NEGATIVE Final    Comment: (NOTE) The Xpert Xpress SARS-CoV-2/FLU/RSV plus assay is intended as an aid in the diagnosis of influenza from Nasopharyngeal swab specimens and should not be used as a sole basis for treatment. Nasal washings and aspirates are unacceptable for Xpert Xpress SARS-CoV-2/FLU/RSV testing.  Fact Sheet for Patients: BloggerCourse.comhttps://www.fda.gov/media/152166/download  Fact Sheet for Healthcare Providers: SeriousBroker.ithttps://www.fda.gov/media/152162/download  This test is not yet approved or cleared by the [place] FDA and has been authorized for detection and/or diagnosis of SARS-CoV-2  by FDA under an Emergency Use Authorization (EUA). This EUA will remain in effect (meaning this test can be used) for the duration of the COVID-19 declaration under Section 564(b)(1) of the Act, 21 U.S.C. section 360bbb-3(b)(1), unless the authorization is terminated or revoked.  [facility]formed [facility], [address] [place], [place] [id number]          Radiology Studies: No results found.      Scheduled Meds: . enoxaparin (LOVENOX) injection  40 mg Subcutaneous Q24H  . feeding supplement  237 mL Oral TID BM  . folic acid  1 mg Oral Daily  . multivitamin with minerals  1 tablet Oral Daily  . phosphorus  500 mg Oral BID  . polyethylene glycol  17 g Oral Daily  . QUEtiapine  25 mg Oral BID  . sodium chloride flush  3 mL Intravenous Q12H  . thiamine  100 mg Oral Daily   Or  . thiamine  100 mg Intravenous Daily   Continuous  Infusions: . sodium chloride 75 mL/hr at 01/04/21 2354     LOS: 8 days    Time spent:40 min    Chayah Mckee, Roselind Messier, MD [facility]   If 7PM-7AM, please contact night-coverage 01/05/2021, 10:52 AM

## 2021-01-06 LAB — RETICULOCYTES
Immature Retic Fract: 23.2 % — ABNORMAL HIGH (ref 2.3–15.9)
RBC.: 3.19 MIL/uL — ABNORMAL LOW (ref 4.22–5.81)
Retic Count, Absolute: 89.3 10*3/uL (ref 19.0–186.0)
Retic Ct Pct: 2.8 % (ref 0.4–3.1)

## 2021-01-06 LAB — FOLATE: Folate: 14 ng/mL (ref >5.9)

## 2021-01-06 LAB — IRON AND TIBC
Iron: 62 ug/dL (ref 45–182)
Saturation Ratios: 19 % (ref 17.9–39.5)
TIBC: 324 ug/dL (ref 250–450)
UIBC: 262 ug/dL

## 2021-01-06 LAB — FERRITIN: Ferritin: 274 ng/mL (ref 24–336)

## 2021-01-06 LAB — VITAMIN B12: Vitamin B-12: 288 pg/mL (ref 180–914)

## 2021-01-06 NOTE — TOC Initial Note (Addendum)
Transition of Care [facility]) - Initial/Assessment Note    Patient Details  Name: Alvin Clark MRN: [id number] Date of Birth: 11-28-1960  Transition of [facility]) CM/SW Contact:    Halford Chessman Phone Number: 01-01-21 2:30pm Clinical Narrative:                  Patient is a 60 year old male who is confused.  Patient has a sister who is involved with his care, but does not have anywhere that he can live.  Patient was referred to SNF for rehab, and potentially long term care.  Patient is homeless, does not have insurance, disability, or source of income.  Patient was staying in a house that he was evicted in in December and has been breaking and entering to get to the house.  Patient was brought to hospital via police because he was caught breaking and entering in his previous place of living.  CSW explained to his sister that it will be very difficult to find somewhere for this patient to go to.  CSW will try to find placement at a SNF for him.  Expected Discharge Plan: Skilled Nursing Facility Barriers to Discharge: Financial Resources,Homeless with medical needs,Continued Medical Work up,No SNF bed,Requiring sitter/restraints,SNF Pending payor source - LOG,SNF Pending Medicaid,Family Issues   Patient Goals and CMS Choice Patient states their goals for this hospitalization and ongoing recovery are:: SNF for rehab and LTC CMS Medicare.gov Compare Post Acute Care list provided to:: Patient Represenative (must comment) Choice offered to / list presented to : [facility] POA / Guardian  Expected Discharge Plan and Services Expected Discharge Plan: Skilled Nursing Facility In-house Referral: Clinical Social Work,Financial Counselor     Living arrangements for the past 2 months: No permanent address                                      Prior Living Arrangements/Services Living arrangements for the past 2 months: No permanent address Lives with:: Self Patient language and need  for interpreter reviewed:: Yes Do you feel safe going back to the place where you live?: No   Patient does not have a safe place for discharge due to his medical issues and substance abuse issues, and lack of financial resources.  Need for Family Participation in Patient Care: Yes (Comment) Care giver support system in place?: No (comment)   Criminal Activity/Legal Involvement Pertinent to Current Situation/Hospitalization: No - Comment as needed  Activities of Daily Living Home Assistive Devices/Equipment: None ADL Screening (condition at time of admission) Patient's cognitive ability adequate to safely complete daily activities?: No Is the patient deaf or have difficulty hearing?: No Does the patient have difficulty seeing, even when wearing glasses/contacts?: Yes Does the patient have difficulty concentrating, remembering, or making decisions?: Yes Patient able to express need for assistance with ADLs?: Yes Does the patient have difficulty dressing or bathing?: Yes Independently performs ADLs?: No Communication: Independent Dressing (OT): Needs assistance Is this a change from baseline?: Change from baseline, expected to last >3 days Grooming: Needs assistance Is this a change from baseline?: Change from baseline, expected to last >3 days Feeding: Needs assistance Is this a change from baseline?: Change from baseline, expected to last >3 days Bathing: Needs assistance Is this a change from baseline?: Change from baseline, expected to last >3 days Toileting: Needs assistance Is this a change from baseline?: Pre-admission baseline In/Out Bed:  Needs assistance Is this a change from baseline?: Pre-admission baseline Walks in Home: Needs assistance Is this a change from baseline?: Pre-admission baseline Does the patient have difficulty walking or climbing stairs?: Yes Weakness of Legs: Both Weakness of Arms/Hands: Both  Permission Sought/Granted Permission sought to share  information with : Case Manager,Family Electrical engineer Permission granted to share information with : Yes, Release of Information Signed,Yes, Verbal Permission Granted  Share Information with NAME: Mavin, Dyke [phone]  [phone]  Soledad Gerlach Other   [phone]      Documents on File           Emotional Assessment Appearance:: Appears older than stated age   Affect (typically observed): Agitated,Anxious,Frustrated Orientation: : Oriented to Self,Oriented to Place Alcohol / Substance Use: Alcohol Use Psych Involvement: Yes (comment)  Admission diagnosis:  Dehydration [E86.0] AKI (acute kidney injury) (HCC) [N17.9] Withdrawal symptoms, alcohol (HCC) [F10.239] Patient Active Problem List   Diagnosis Date Noted  . Protein-calorie malnutrition, severe 12/29/2020  . Withdrawal symptoms, alcohol (HCC) 12/28/2020  . AKI (acute kidney injury) (HCC) 12/27/2020  . Confusion 12/27/2020  . Leukocytosis 12/27/2020  . Blurry vision, bilateral 12/27/2020  . Transaminitis 12/27/2020  . Constipation 12/27/2020   PCP:  Patient, No Pcp Per Pharmacy:   [id number] - [place], [place] - [address] [address] Phone: [phone] Fax: [phone]     Social Determinants of Health (SDOH) Interventions    Readmission Risk Interventions No flowsheet data found.

## 2021-01-06 NOTE — Progress Notes (Signed)
PROGRESS NOTE    Alvin Clark  POE:[id number] DOB: 23-Jul-1961 DOA: 12/27/2020 PCP: Patient, No Pcp Per     Brief Narrative:  Alvin Clark is a 60 y.o. BM PMHx Chronic ETOH     3/9, patient was trying to enter the home that he was evicted from. Homeowners called [place] [employer] found him frail and disoriented.  EMS was called in. EMS called patient's family who stated that patient had concerns for chronic alcoholism and developing memory issues for last 1 to 2 years.   In the ED, patient was afebrile, slightly tachycardic, blood pressure stable. Labs showed BUN/creatinine 64/1.83, from a baseline of normal 3 years ago.  AST/ALT elevated to 129/113, WC count slightly elevated 12.4. Blood alcohol level was negative.  Urine drug screen was negative urinalysis showed hazy yellow urine. CT scan of the head showed generalized volume loss advanced for his age, without acute abnormality. CT cervical spine showed advanced cervical spine degeneration with suspected multilevel mild spinal stenosis. Chest x-ray unremarkable. Patient was admitted to hospital service.   Subjective: 3/19 afebrile overnight A/O x1 (does not know where, when, why).  Pleasant sitting in chair.  Follows commands.   Assessment & Plan: Covid vaccination; unvaccinated   Principal Problem:   AKI (acute kidney injury) (HCC) Active Problems:   Confusion   Leukocytosis   Blurry vision, bilateral   Transaminitis   Constipation   Withdrawal symptoms, alcohol (HCC)   Protein-calorie malnutrition, severe  Acute metabolic Encephalopathy -Multifactorial EtOH abuse, malnutrition. -Treat underlying causes -Unsure of baseline. -Per EMR family reported memory issues for last 1 to 2 years. -CT scan head negative acute abnormality see results below -3/10 UDS negative -Per staff intermittent episodes of agitation, delirium -3/19 encephalopathy slightly worse today, however patient compliant and pleasant. -Seroquel 25 mg  daily  Chronic EtOH abuse -Continue folic acid 1 mg daily -Thiamine 100 mg daily  Normocytic anemia -Anemia panel consistent with normocytic anemia -Occult blood pending   AKI (baseline Cr<1) Lab Results  Component Value Date   CREATININE 0.88 01/03/2021   CREATININE 0.94 01/01/2021   CREATININE 0.88 12/31/2020   CREATININE 0.88 12/30/2020   CREATININE 1.12 12/29/2020  -At baseline -Continue normal saline 2ml/hr  Hypokalemia -Potassium goal> 4 -Trend M/W/F  Hypomagnesmia -Magnesium goal> 2 -Trend M/W/F  Hypophosphatemia -Phosphorus goal> 2.5 -Trend M/W/F  Impaired mobility -PT recommends SNF -Ambulate patient every shift  Transaminitis -Most likely secondary to EtOH abuse will follow M/W/F  Severe malnutrition -Severe Malnutritionrelated to chronic illness (chronic alcoholism)as evidenced by severe fat depletion,severe muscle depletion. -Encourage patient to to eat -Ensure TID  Constipation -Senokot, MiraLAX as needed   DVT prophylaxis: Lovenox Code Status: Full Family Communication:  Status is: Inpatient    Dispo: The patient is from: Home              Anticipated d/c is to: SNF              Anticipated d/c date is:??              Patient currently unstable      Consultants:    Procedures/Significant Events:  3/10 CT head W0 contrast; no acute abnormality: Advanced generalized cerebral volume loss, white matter changes. 3/10 CT C-spine W0 contrast; advanced C-spine degeneration with suspected multilevel mild spinal stenosis   I have personally reviewed and interpreted all radiology studies and my findings are as above.  VENTILATOR SETTINGS:    Cultures   Antimicrobials:    Devices  LINES / TUBES:      Continuous Infusions: . sodium chloride 75 mL/hr at 01/06/21 0123     Objective: Vitals:   01/05/21 0509 01/05/21 1934 01/06/21 0500 01/06/21 1343  BP: 114/86 130/82 129/88 (!) 159/123  Pulse: 99 (!) 103 (!)  102 95  Resp: 15 18 18 18   Temp: 97.7 F (36.5 C) 98.5 F (36.9 C) 99.6 F (37.6 C) 98.8 F (37.1 C)  TempSrc: Oral Oral Oral Oral  SpO2: 98% 100% 100% 100%  Weight: 58.3 kg  60.3 kg   Height:        Intake/Output Summary (Last 24 hours) at 01/06/2021 1632 Last data filed at 01/06/2021 1430 Gross per 24 hour  Intake 1349 ml  Output 2225 ml  Net -876 ml   Filed Weights   01/04/21 0533 01/05/21 0509 01/06/21 0500  Weight: 62.4 kg 58.3 kg 60.3 kg    Examination:  General: A/O x1 (does not know where, when, why), No acute respiratory distress, cachectic Eyes: negative scleral hemorrhage, negative anisocoria, negative icterus ENT: Negative Runny nose, negative gingival bleeding, Neck:  Negative scars, masses, torticollis, lymphadenopathy, JVD Lungs: Clear to auscultation bilaterally without wheezes or crackles Cardiovascular: Regular rate and rhythm without murmur gallop or rub normal S1 and S2 Abdomen: negative abdominal pain, nondistended, positive soft, bowel sounds, no rebound, no ascites, no appreciable mass Extremities: No significant cyanosis, clubbing, or edema bilateral lower extremities Skin: Negative rashes, lesions, ulcers Psychiatric:  Negative depression, negative anxiety, negative fatigue, negative mania  Central nervous system:  Cranial nerves II through XII intact, tongue/uvula midline, all extremities muscle strength 5/5, sensation intact throughout, negative dysarthria, negative expressive aphasia, negative receptive aphasia.  .     Data Reviewed: Care during the described time interval was provided by me .  I have reviewed this patient's available data, including medical history, events of note, physical examination, and all test results as part of my evaluation.  CBC: Recent Labs  Lab 12/31/20 0430 01/03/21 0348  WBC 7.9 6.5  NEUTROABS 5.4 3.9  HGB 10.3* 10.2*  HCT 32.2* 32.0*  MCV 101.3* 101.3*  PLT 168 171   Basic Metabolic Panel: Recent Labs   Lab 12/31/20 0430 01/01/21 0331 01/03/21 0348  NA 140 142 143  K 3.6 3.9 3.9  CL 112* 110 107  CO2 23 24 29   GLUCOSE 114* 135* 97  BUN 9 8 8   CREATININE 0.88 0.94 0.88  CALCIUM 8.5* 8.8* 9.5  MG 1.8  --  1.5*  PHOS 1.6* 2.1* 2.7   GFR: Estimated Creatinine Clearance: 78 mL/min (by C-G formula based on SCr of 0.88 mg/dL). Liver Function Tests: Recent Labs  Lab 12/31/20 0430 01/01/21 0331  AST 52* 65*  ALT 49* 50*  ALKPHOS 50 55  BILITOT 0.9 1.1  PROT 5.4* 5.6*  ALBUMIN 2.7* 2.8*   No results for input(s): LIPASE, AMYLASE in the last 168 hours. No results for input(s): AMMONIA in the last 168 hours. Coagulation Profile: No results for input(s): INR, PROTIME in the last 168 hours. Cardiac Enzymes: No results for input(s): CKTOTAL, CKMB, CKMBINDEX, TROPONINI in the last 168 hours. BNP (last 3 results) No results for input(s): PROBNP in the last 8760 hours. HbA1C: No results for input(s): HGBA1C in the last 72 hours. CBG: No results for input(s): GLUCAP in the last 168 hours. Lipid Profile: No results for input(s): CHOL, HDL, LDLCALC, TRIG, CHOLHDL, LDLDIRECT in the last 72 hours. Thyroid Function Tests: No results for input(s): TSH, T4TOTAL, FREET4,  T3FREE, THYROIDAB in the last 72 hours. Anemia Panel: Recent Labs    01/06/21 0351  VITAMINB12 288  FOLATE 14.0  FERRITIN 274  TIBC 324  IRON 62  RETICCTPCT 2.8   Sepsis Labs: No results for input(s): PROCALCITON, LATICACIDVEN in the last 168 hours.  Recent Results (from the past 240 hour(s))  Resp Panel by RT-PCR (Flu A&B, Covid) Nasopharyngeal Swab     Status: None   Collection Time: 12/28/20  4:43 AM   Specimen: Nasopharyngeal Swab; Nasopharyngeal(NP) swabs in vial transport medium  Result Value Ref Range Status   SARS Coronavirus 2 by RT PCR NEGATIVE NEGATIVE Final    Comment: (NOTE) SARS-CoV-2 target nucleic acids are NOT DETECTED.  The SARS-CoV-2 RNA is generally detectable in upper  respiratory specimens during the acute phase of infection. The lowest concentration of SARS-CoV-2 viral copies this assay can detect is 138 copies/mL. A negative result does not preclude SARS-Cov-2 infection and should not be used as the sole basis for treatment or other patient management decisions. A negative result may occur with  improper specimen collection/handling, submission of specimen other than nasopharyngeal swab, presence of viral mutation(s) within the areas targeted by this assay, and inadequate number of viral copies(<138 copies/mL). A negative result must be combined with clinical observations, patient history, and epidemiological information. The expected result is Negative.  Fact Sheet for Patients:  BloggerCourse.com  Fact Sheet for Healthcare Providers:  SeriousBroker.it  This test is no t yet approved or cleared by the [place] FDA and  has been authorized for detection and/or diagnosis of SARS-CoV-2 by FDA under an Emergency Use Authorization (EUA). This EUA will remain  in effect (meaning this test can be used) for the duration of the COVID-19 declaration under Section 564(b)(1) of the Act, 21 U.S.C.section 360bbb-3(b)(1), unless the authorization is terminated  or revoked sooner.       Influenza A by PCR NEGATIVE NEGATIVE Final   Influenza B by PCR NEGATIVE NEGATIVE Final    Comment: (NOTE) The Xpert Xpress SARS-CoV-2/FLU/RSV plus assay is intended as an aid in the diagnosis of influenza from Nasopharyngeal swab specimens and should not be used as a sole basis for treatment. Nasal washings and aspirates are unacceptable for Xpert Xpress SARS-CoV-2/FLU/RSV testing.  Fact Sheet for Patients: BloggerCourse.com  Fact Sheet for Healthcare Providers: SeriousBroker.it  This test is not yet approved or cleared by the [place] FDA and has been  authorized for detection and/or diagnosis of SARS-CoV-2 by FDA under an Emergency Use Authorization (EUA). This EUA will remain in effect (meaning this test can be used) for the duration of the COVID-19 declaration under Section 564(b)(1) of the Act, 21 U.S.C. section 360bbb-3(b)(1), unless the authorization is terminated or revoked.  Performed at [facility], [address] [place], [place] [address]          Radiology Studies: No results found.      Scheduled Meds: . enoxaparin (LOVENOX) injection  40 mg Subcutaneous Q24H  . feeding supplement  237 mL Oral TID BM  . folic acid  1 mg Oral Daily  . multivitamin with minerals  1 tablet Oral Daily  . phosphorus  500 mg Oral BID  . polyethylene glycol  17 g Oral Daily  . QUEtiapine  25 mg Oral BID  . sodium chloride flush  3 mL Intravenous Q12H  . thiamine  100 mg Oral Daily   Or  . thiamine  100 mg Intravenous Daily   Continuous Infusions: .  sodium chloride 75 mL/hr at 01/06/21 0123     LOS: 9 days    Time spent:40 min    WOODS, Roselind Messier, MD [facility]   If 7PM-7AM, please contact night-coverage 01/06/2021, 4:32 PM

## 2021-01-06 NOTE — TOC Progression Note (Addendum)
Transition of Care [facility]) - Progression Note    Patient Details  Name: GURINDER TORAL MRN: [id number] Date of Birth: 02-13-1961  Transition of Care [facility]) CM/SW Contact  Darleene Cleaver, [place] Phone Number: 01/05/21 3:30pm Clinical Narrative:     Patient does not have any payor source for SNF placement.  CSW has contacted [address], [facility], [facility] in [place], [facility] in [place], [place], [facility], and [facility] to see if patient is able to get a 30 day LOG.  None of facilities are willing to accept patient.  Patient has been faxed out to all of the other facilities in region.  Patient was screened by Dionicia Abler to see if patient is eligible for Medicaid, case is still pending.  CSW to continue to follow patient's progress throughout discharge planning.  CSW to follow up on Monday.   Expected Discharge Plan: Skilled Nursing Facility Barriers to Discharge: Financial Resources,Homeless with medical needs,Continued Medical Work up,No SNF bed,Requiring sitter/restraints,SNF Pending payor source - LOG,SNF Pending Medicaid,Family Issues  Expected Discharge Plan and Services Expected Discharge Plan: Skilled Nursing Facility In-house Referral: Clinical Social Work,Financial Counselor     Living arrangements for the past 2 months: No permanent address                                       Social Determinants of Health (SDOH) Interventions    Readmission Risk Interventions No flowsheet data found.

## 2021-01-07 NOTE — Plan of Care (Signed)
  Problem: Activity: Goal: Risk for activity intolerance will decrease Outcome: Progressing   Problem: Nutrition: Goal: Adequate nutrition will be maintained Outcome: Progressing   Problem: Coping: Goal: Level of anxiety will decrease Outcome: Progressing   

## 2021-01-07 NOTE — Progress Notes (Signed)
PROGRESS NOTE    Alvin Clark  VPX:[id number] DOB: Oct 17, 1961 DOA: 12/27/2020 PCP: Patient, No Pcp Per     Brief Narrative:  Alvin Clark is a 60 y.o. BM PMHx Chronic ETOH     3/9, patient was trying to enter the home that he was evicted from. Homeowners called [place] [employer] found him frail and disoriented.  EMS was called in. EMS called patient's family who stated that patient had concerns for chronic alcoholism and developing memory issues for last 1 to 2 years.   In the ED, patient was afebrile, slightly tachycardic, blood pressure stable. Labs showed BUN/creatinine 64/1.83, from a baseline of normal 3 years ago.  AST/ALT elevated to 129/113, WC count slightly elevated 12.4. Blood alcohol level was negative.  Urine drug screen was negative urinalysis showed hazy yellow urine. CT scan of the head showed generalized volume loss advanced for his age, without acute abnormality. CT cervical spine showed advanced cervical spine degeneration with suspected multilevel mild spinal stenosis. Chest x-ray unremarkable. Patient was admitted to hospital service.   Subjective: 3/20 A/O x1 (does not know where, when, why) pleasant lying in the bed.  Follows commands.     Assessment & Plan: Covid vaccination; unvaccinated   Principal Problem:   AKI (acute kidney injury) (HCC) Active Problems:   Confusion   Leukocytosis   Blurry vision, bilateral   Transaminitis   Constipation   Withdrawal symptoms, alcohol (HCC)   Protein-calorie malnutrition, severe  Acute metabolic Encephalopathy -Multifactorial EtOH abuse, malnutrition. -Treat underlying causes -Unsure of baseline. -Per EMR family reported memory issues for last 1 to 2 years. -CT scan head negative acute abnormality see results below -3/10 UDS negative -Per staff intermittent episodes of agitation, delirium -3/19 encephalopathy slightly worse today, however patient compliant and pleasant. -Seroquel 25 mg daily  Chronic EtOH  abuse -Continue folic acid 1 mg daily -Thiamine 100 mg daily  Normocytic anemia -Anemia panel consistent with normocytic anemia -Occult blood pending   AKI (baseline Cr<1) Lab Results  Component Value Date   CREATININE 0.88 01/03/2021   CREATININE 0.94 01/01/2021   CREATININE 0.88 12/31/2020   CREATININE 0.88 12/30/2020   CREATININE 1.12 12/29/2020  -At baseline -Continue normal saline 85ml/hr  Hypokalemia -Potassium goal> 4 -Trend M/W/F  Hypomagnesmia -Magnesium goal> 2 -Trend M/W/F  Hypophosphatemia -Phosphorus goal> 2.5 -Trend M/W/F  Impaired mobility -PT recommends SNF -Ambulate patient every shift  Transaminitis -Most likely secondary to EtOH abuse will follow M/W/F  Severe malnutrition -Severe Malnutritionrelated to chronic illness (chronic alcoholism)as evidenced by severe fat depletion,severe muscle depletion. -Encourage patient to to eat -Ensure TID  Constipation -Senokot, MiraLAX as needed   DVT prophylaxis: Lovenox Code Status: Full Family Communication:  Status is: Inpatient    Dispo: The patient is from: Home              Anticipated d/c is to: SNF              Anticipated d/c date is:??              Patient currently unstable      Consultants:    Procedures/Significant Events:  3/10 CT head W0 contrast; no acute abnormality: Advanced generalized cerebral volume loss, white matter changes. 3/10 CT C-spine W0 contrast; advanced C-spine degeneration with suspected multilevel mild spinal stenosis   I have personally reviewed and interpreted all radiology studies and my findings are as above.  VENTILATOR SETTINGS:    Cultures   Antimicrobials:    Devices  LINES / TUBES:      Continuous Infusions: . sodium chloride 75 mL/hr at 01/07/21 1706     Objective: Vitals:   01/06/21 0500 01/06/21 1343 01/07/21 0607 01/07/21 1405  BP: 129/88 (!) 159/123  109/73  Pulse: (!) 102 95  88  Resp: 18 18  18   Temp:  99.6 F (37.6 C) 98.8 F (37.1 C)  98 F (36.7 C)  TempSrc: Oral Oral  Oral  SpO2: 100% 100%  99%  Weight: 60.3 kg  60.6 kg   Height:        Intake/Output Summary (Last 24 hours) at 01/07/2021 1936 Last data filed at 01/07/2021 1610 Gross per 24 hour  Intake 960 ml  Output 4150 ml  Net -3190 ml   Filed Weights   01/05/21 0509 01/06/21 0500 01/07/21 01/09/21  Weight: 58.3 kg 60.3 kg 60.6 kg    Examination:  General: A/O x1 (does not know where, when, why), No acute respiratory distress, cachectic Eyes: negative scleral hemorrhage, negative anisocoria, negative icterus ENT: Negative Runny nose, negative gingival bleeding, Neck:  Negative scars, masses, torticollis, lymphadenopathy, JVD Lungs: Clear to auscultation bilaterally without wheezes or crackles Cardiovascular: Regular rate and rhythm without murmur gallop or rub normal S1 and S2 Abdomen: negative abdominal pain, nondistended, positive soft, bowel sounds, no rebound, no ascites, no appreciable mass Extremities: No significant cyanosis, clubbing, or edema bilateral lower extremities Skin: Negative rashes, lesions, ulcers Psychiatric:  Negative depression, negative anxiety, negative fatigue, negative mania  Central nervous system:  Cranial nerves II through XII intact, tongue/uvula midline, all extremities muscle strength 5/5, sensation intact throughout, negative dysarthria, negative expressive aphasia, negative receptive aphasia.  .     Data Reviewed: Care during the described time interval was provided by me .  I have reviewed this patient's available data, including medical history, events of note, physical examination, and all test results as part of my evaluation.  CBC: Recent Labs  Lab 01/03/21 0348  WBC 6.5  NEUTROABS 3.9  HGB 10.2*  HCT 32.0*  MCV 101.3*  PLT 171   Basic Metabolic Panel: Recent Labs  Lab 01/01/21 0331 01/03/21 0348  NA 142 143  K 3.9 3.9  CL 110 107  CO2 24 29  GLUCOSE 135* 97   BUN 8 8  CREATININE 0.94 0.88  CALCIUM 8.8* 9.5  MG  --  1.5*  PHOS 2.1* 2.7   GFR: Estimated Creatinine Clearance: 78.4 mL/min (by C-G formula based on SCr of 0.88 mg/dL). Liver Function Tests: Recent Labs  Lab 01/01/21 0331  AST 65*  ALT 50*  ALKPHOS 55  BILITOT 1.1  PROT 5.6*  ALBUMIN 2.8*   No results for input(s): LIPASE, AMYLASE in the last 168 hours. No results for input(s): AMMONIA in the last 168 hours. Coagulation Profile: No results for input(s): INR, PROTIME in the last 168 hours. Cardiac Enzymes: No results for input(s): CKTOTAL, CKMB, CKMBINDEX, TROPONINI in the last 168 hours. BNP (last 3 results) No results for input(s): PROBNP in the last 8760 hours. HbA1C: No results for input(s): HGBA1C in the last 72 hours. CBG: No results for input(s): GLUCAP in the last 168 hours. Lipid Profile: No results for input(s): CHOL, HDL, LDLCALC, TRIG, CHOLHDL, LDLDIRECT in the last 72 hours. Thyroid Function Tests: No results for input(s): TSH, T4TOTAL, FREET4, T3FREE, THYROIDAB in the last 72 hours. Anemia Panel: Recent Labs    01/06/21 0351  VITAMINB12 288  FOLATE 14.0  FERRITIN 274  TIBC 324  IRON 62  RETICCTPCT 2.8   Sepsis Labs: No results for input(s): PROCALCITON, LATICACIDVEN in the last 168 hours.  No results found for this or any previous visit (from the past 240 hour(s)).       Radiology Studies: No results found.      Scheduled Meds: . enoxaparin (LOVENOX) injection  40 mg Subcutaneous Q24H  . feeding supplement  237 mL Oral TID BM  . folic acid  1 mg Oral Daily  . multivitamin with minerals  1 tablet Oral Daily  . polyethylene glycol  17 g Oral Daily  . QUEtiapine  25 mg Oral BID  . sodium chloride flush  3 mL Intravenous Q12H  . thiamine  100 mg Oral Daily   Or  . thiamine  100 mg Intravenous Daily   Continuous Infusions: . sodium chloride 75 mL/hr at 01/07/21 1706     LOS: 10 days    Time spent:40 min    Rasean Joos,  Roselind Messier, MD [facility]   If 7PM-7AM, please contact night-coverage 01/07/2021, 7:36 PM

## 2021-01-08 LAB — CBC
HCT: 30.6 % — ABNORMAL LOW (ref 39.0–52.0)
Hemoglobin: 9.6 g/dL — ABNORMAL LOW (ref 13.0–17.0)
MCH: 32.2 pg (ref 26.0–34.0)
MCHC: 31.4 g/dL (ref 30.0–36.0)
MCV: 102.7 fL — ABNORMAL HIGH (ref 80.0–100.0)
Platelets: 187 10*3/uL (ref 150–400)
RBC: 2.98 MIL/uL — ABNORMAL LOW (ref 4.22–5.81)
RDW: 15.1 % (ref 11.5–15.5)
WBC: 8.8 10*3/uL (ref 4.0–10.5)
nRBC: 0 % (ref 0.0–0.2)

## 2021-01-08 LAB — COMPREHENSIVE METABOLIC PANEL
ALT: 36 U/L (ref 0–44)
AST: 49 U/L — ABNORMAL HIGH (ref 15–41)
Albumin: 3 g/dL — ABNORMAL LOW (ref 3.5–5.0)
Alkaline Phosphatase: 92 U/L (ref 38–126)
Anion gap: 8 (ref 5–15)
BUN: 17 mg/dL (ref 6–20)
CO2: 24 mmol/L (ref 22–32)
Calcium: 9.2 mg/dL (ref 8.9–10.3)
Chloride: 105 mmol/L (ref 98–111)
Creatinine, Ser: 0.87 mg/dL (ref 0.61–1.24)
GFR, Estimated: >60 mL/min (ref >60)
Glucose, Bld: 122 mg/dL — ABNORMAL HIGH (ref 70–99)
Potassium: 4.2 mmol/L (ref 3.5–5.1)
Sodium: 137 mmol/L (ref 135–145)
Total Bilirubin: 0.7 mg/dL (ref 0.3–1.2)
Total Protein: 6.7 g/dL (ref 6.5–8.1)

## 2021-01-08 LAB — MAGNESIUM: Magnesium: 1.7 mg/dL (ref 1.7–2.4)

## 2021-01-08 LAB — PHOSPHORUS: Phosphorus: 3.2 mg/dL (ref 2.5–4.6)

## 2021-01-08 MED ORDER — ACETAMINOPHEN 325 MG PO TABS
650.0000 mg | ORAL_TABLET | Freq: Four times a day (QID) | ORAL | Status: DC | PRN
  Administered 2021-01-08 – 2021-04-15 (×10): 650 mg via ORAL
  Filled 2021-01-08 (×10): qty 2

## 2021-01-08 NOTE — TOC Progression Note (Addendum)
Transition of Care [facility]) - Progression Note    Patient Details  Name: Alvin Clark MRN: [id number] Date of Birth: 1961/07/26  [facility] CM/SW Contact  Darleene Cleaver, [place] Phone Number: 01/08/2021, 12:28 PM  Clinical Narrative:     CSW spoke to team lead Steward Drone, to discuss putting patient on the difficult to place list.  CSW was informed that patient is being discussed with the difficult to place team.  CSW to continue to follow patient's progress throughout discharge planning.  Patient currently still does not have any bed offers at this time.  CSW working with leadership team to find placement for patient.  1:08pm  CSW spoke to patient's sister Alvin Clark, [phone] and updated her on bed search process.  She confirmed that she has spoken to someone from financial counseling last week, they have started the application for Medicaid and disability for patient.  CSW updated patient's sister that the team is continuing to look for placement for patient.   Expected Discharge Plan: Skilled Nursing Facility Barriers to Discharge: Financial Resources,Homeless with medical needs,Continued Medical Work up,No SNF bed,Requiring sitter/restraints,SNF Pending payor source - LOG,SNF Pending Medicaid,Family Issues  Expected Discharge Plan and Services Expected Discharge Plan: Skilled Nursing Facility In-house Referral: Clinical Social Work,Financial Counselor     Living arrangements for the past 2 months: No permanent address                                       Social Determinants of Health (SDOH) Interventions    Readmission Risk Interventions No flowsheet data found.

## 2021-01-08 NOTE — Progress Notes (Signed)
PROGRESS NOTE    Alvin Clark  NLZ:[id number] DOB: 26-Jul-1961 DOA: 12/27/2020 PCP: Patient, No Pcp Per     Brief Narrative:  Alvin Clark is a 60 y.o. BM PMHx Chronic ETOH     3/9, patient was trying to enter the home that he was evicted from. Homeowners called [place] [employer] found him frail and disoriented.  EMS was called in. EMS called patient's family who stated that patient had concerns for chronic alcoholism and developing memory issues for last 1 to 2 years.   In the ED, patient was afebrile, slightly tachycardic, blood pressure stable. Labs showed BUN/creatinine 64/1.83, from a baseline of normal 3 years ago.  AST/ALT elevated to 129/113, WC count slightly elevated 12.4. Blood alcohol level was negative.  Urine drug screen was negative urinalysis showed hazy yellow urine. CT scan of the head showed generalized volume loss advanced for his age, without acute abnormality. CT cervical spine showed advanced cervical spine degeneration with suspected multilevel mild spinal stenosis. Chest x-ray unremarkable. Patient was admitted to hospital service.   Subjective: 3/21 afebrile overnight A/O x3 (does not know why).  Laying in bed comfortably follows commands.     A/O x1 (does not know where, when, why) pleasant lying in the bed.  Follows commands.     Assessment & Plan: Covid vaccination; unvaccinated   Principal Problem:   AKI (acute kidney injury) (HCC) Active Problems:   Confusion   Leukocytosis   Blurry vision, bilateral   Transaminitis   Constipation   Withdrawal symptoms, alcohol (HCC)   Protein-calorie malnutrition, severe  Acute metabolic Encephalopathy -Multifactorial EtOH abuse, malnutrition. -Treat underlying causes -Unsure of baseline. -Per EMR family reported memory issues for last 1 to 2 years. -CT scan head negative acute abnormality see results below -3/10 UDS negative -Per staff intermittent episodes of agitation, delirium -3/19 encephalopathy  slightly worse today, however patient compliant and pleasant. -Seroquel 25 mg daily  Chronic EtOH abuse -Continue folic acid 1 mg daily -Thiamine 100 mg daily  Normocytic anemia -Anemia panel consistent with normocytic anemia -Occult blood pending   AKI (baseline Cr<1) Lab Results  Component Value Date   CREATININE 0.87 01/08/2021   CREATININE 0.88 01/03/2021   CREATININE 0.94 01/01/2021   CREATININE 0.88 12/31/2020   CREATININE 0.88 12/30/2020  -At baseline -Continue normal saline 47ml/hr  Hypokalemia -Potassium goal> 4 -Trend M/W/F  Hypomagnesmia -Magnesium goal> 2 -Trend M/W/F  Hypophosphatemia -Phosphorus goal> 2.5 -Trend M/W/F  Impaired mobility -PT recommends SNF -Ambulate patient every shift  Transaminitis -Most likely secondary to EtOH abuse will follow M/W/F -Resolved  Severe malnutrition -Severe Malnutritionrelated to chronic illness (chronic alcoholism)as evidenced by severe fat depletion,severe muscle depletion. -Encourage patient to to eat -Ensure TID  Constipation -Senokot, MiraLAX as needed   DVT prophylaxis: Lovenox Code Status: Full Family Communication:  Status is: Inpatient    Dispo: The patient is from: Home              Anticipated d/c is to: SNF              Anticipated d/c date is:??              Patient currently unstable      Consultants:    Procedures/Significant Events:  3/10 CT head W0 contrast; no acute abnormality: Advanced generalized cerebral volume loss, white matter changes. 3/10 CT C-spine W0 contrast; advanced C-spine degeneration with suspected multilevel mild spinal stenosis   I have personally reviewed and interpreted all radiology studies  and my findings are as above.  VENTILATOR SETTINGS:    Cultures   Antimicrobials:    Devices    LINES / TUBES:      Continuous Infusions: . sodium chloride 75 mL/hr at 01/08/21 0618     Objective: Vitals:   01/07/21 0607 01/07/21 1405  01/07/21 2112 01/08/21 0500  BP:  109/73 122/73   Pulse:  88 94   Resp:  18 17   Temp:  98 F (36.7 C) 98.2 F (36.8 C)   TempSrc:  Oral Oral   SpO2:  99% 100%   Weight: 60.6 kg   64.6 kg  Height:        Intake/Output Summary (Last 24 hours) at 01/08/2021 0850 Last data filed at 01/08/2021 3329 Gross per 24 hour  Intake 840 ml  Output 2300 ml  Net -1460 ml   Filed Weights   01/06/21 0500 01/07/21 0607 01/08/21 0500  Weight: 60.3 kg 60.6 kg 64.6 kg   Physical Exam:  General: A/O x3 (does not know why) No acute respiratory distress, cachectic Eyes: negative scleral hemorrhage, negative anisocoria, negative icterus ENT: Negative Runny nose, negative gingival bleeding, poor dentation Neck:  Negative scars, masses, torticollis, lymphadenopathy, JVD Lungs: Clear to auscultation bilaterally without wheezes or crackles Cardiovascular: Regular rate and rhythm without murmur gallop or rub normal S1 and S2 Abdomen: negative abdominal pain, nondistended, positive soft, bowel sounds, no rebound, no ascites, no appreciable mass Extremities: No significant cyanosis, clubbing, or edema bilateral lower extremities Skin: Negative rashes, lesions, ulcers Psychiatric:  Negative depression, negative anxiety, negative fatigue, negative mania  Central nervous system:  Cranial nerves II through XII intact, tongue/uvula midline, all extremities muscle strength 5/5, sensation intact throughout, negative dysarthria, negative expressive aphasia, negative receptive aphasia.  .     Data Reviewed: Care during the described time interval was provided by me .  I have reviewed this patient's available data, including medical history, events of note, physical examination, and all test results as part of my evaluation.  CBC: Recent Labs  Lab 01/03/21 0348 01/08/21 0321  WBC 6.5 8.8  NEUTROABS 3.9  --   HGB 10.2* 9.6*  HCT 32.0* 30.6*  MCV 101.3* 102.7*  PLT 171 187   Basic Metabolic Panel: Recent  Labs  Lab 01/03/21 0348 01/08/21 0321  NA 143 137  K 3.9 4.2  CL 107 105  CO2 29 24  GLUCOSE 97 122*  BUN 8 17  CREATININE 0.88 0.87  CALCIUM 9.5 9.2  MG 1.5* 1.7  PHOS 2.7 3.2   GFR: Estimated Creatinine Clearance: 84.6 mL/min (by C-G formula based on SCr of 0.87 mg/dL). Liver Function Tests: Recent Labs  Lab 01/08/21 0321  AST 49*  ALT 36  ALKPHOS 92  BILITOT 0.7  PROT 6.7  ALBUMIN 3.0*   No results for input(s): LIPASE, AMYLASE in the last 168 hours. No results for input(s): AMMONIA in the last 168 hours. Coagulation Profile: No results for input(s): INR, PROTIME in the last 168 hours. Cardiac Enzymes: No results for input(s): CKTOTAL, CKMB, CKMBINDEX, TROPONINI in the last 168 hours. BNP (last 3 results) No results for input(s): PROBNP in the last 8760 hours. HbA1C: No results for input(s): HGBA1C in the last 72 hours. CBG: No results for input(s): GLUCAP in the last 168 hours. Lipid Profile: No results for input(s): CHOL, HDL, LDLCALC, TRIG, CHOLHDL, LDLDIRECT in the last 72 hours. Thyroid Function Tests: No results for input(s): TSH, T4TOTAL, FREET4, T3FREE, THYROIDAB in the last 72  hours. Anemia Panel: Recent Labs    01/06/21 0351  VITAMINB12 288  FOLATE 14.0  FERRITIN 274  TIBC 324  IRON 62  RETICCTPCT 2.8   Sepsis Labs: No results for input(s): PROCALCITON, LATICACIDVEN in the last 168 hours.  No results found for this or any previous visit (from the past 240 hour(s)).       Radiology Studies: No results found.      Scheduled Meds: . enoxaparin (LOVENOX) injection  40 mg Subcutaneous Q24H  . feeding supplement  237 mL Oral TID BM  . folic acid  1 mg Oral Daily  . multivitamin with minerals  1 tablet Oral Daily  . polyethylene glycol  17 g Oral Daily  . QUEtiapine  25 mg Oral BID  . sodium chloride flush  3 mL Intravenous Q12H  . thiamine  100 mg Oral Daily   Or  . thiamine  100 mg Intravenous Daily   Continuous Infusions: .  sodium chloride 75 mL/hr at 01/08/21 0618     LOS: 11 days    Time spent:40 min    Attallah Ontko, Roselind Messier, MD [facility]   If 7PM-7AM, please contact night-coverage 01/08/2021, 8:50 AM

## 2021-01-08 NOTE — Progress Notes (Signed)
Physical Therapy Treatment Patient Details Name: Alvin Clark MRN: [id number] DOB: [birth year] Today's Date: 01/08/2021    History of Present Illness Pt admitted on 12/27/20 with Acute metabolic encephalopathy, Chronic evolving memory issues, Chronic alcoholism. He had a fall out of bed on 3/11.    PT Comments    Patient impulsive, somewhat ataxic when ambulating. Patient staying behind RW, leaning forward. Frequent stops to reposition. Tends to veer into objects. Indicated urgency to get to toilet while ambulating but incontinent of B/B . Continue PT.  Follow Up Recommendations  SNF     Equipment Recommendations  None recommended by PT    Recommendations for Other Services       Precautions / Restrictions Precautions Precautions: Fall Precaution Comments: incontinent b/B , may need a brief Restrictions Weight Bearing Restrictions: No    Mobility  Bed Mobility   Bed Mobility: Supine to Sit;Sit to Supine     Supine to sit: Supervision Sit to supine: Supervision   General bed mobility comments: supervision for safety    Transfers   Equipment used: Rolling walker (2 wheeled) Transfers: Sit to/from Stand Sit to Stand: Min guard         General transfer comment: steady assist to rise from lower bed x 2  Ambulation/Gait Ambulation/Gait assistance: Min assist Gait Distance (Feet): 120 Feet Assistive device: Rolling walker (2 wheeled) Gait Pattern/deviations: Decreased stride length;Shuffle;Trunk flexed;Scissoring;Narrow base of support Gait velocity: decreased   General Gait Details: pt with narrow BOS, veers to L/R,  walks outside RW base, RW far ahead. Multimodal cues for  Rw position.Requires frequent stops to reposition inside RW. Patient stated " I need the closet, got to pee"> Patient incontinent of B/B while returning to room.   Stairs             Wheelchair Mobility    Modified Rankin (Stroke Patients Only)       Balance Overall balance  assessment: Needs assistance;History of Falls Sitting-balance support: Feet supported Sitting balance-Leahy Scale: Good Sitting balance - Comments: seated EOB   Standing balance support: Bilateral upper extremity supported;No upper extremity supported Standing balance-Leahy Scale: Poor Standing balance comment: BUE reliant on support with occasional min A, while stnading to be cleaned up.                            Cognition Arousal/Alertness: Awake/alert Behavior During Therapy: WFL for tasks assessed/performed;Impulsive Overall Cognitive Status: No family/caregiver present to determine baseline cognitive functioning Area of Impairment: Orientation;Safety/judgement;Problem solving                         Safety/Judgement: Decreased awareness of safety   Problem Solving: Requires verbal cues;Requires tactile cues General Comments: pleasant. Pt impulsive, improves with cues for safety.      Exercises      General Comments        Pertinent Vitals/Pain Pain Assessment: No/denies pain    Home Living                      Prior Function            PT Goals (current goals can now be found in the care plan section) Progress towards PT goals: Progressing toward goals    Frequency    Min 2X/week      PT Plan Current plan remains appropriate;Frequency needs to be updated    Co-evaluation  AM-PAC PT "6 Clicks" Mobility   Outcome Measure  Help needed turning from your back to your side while in a flat bed without using bedrails?: None Help needed moving from lying on your back to sitting on the side of a flat bed without using bedrails?: None Help needed moving to and from a bed to a chair (including a wheelchair)?: A Little Help needed standing up from a chair using your arms (e.g., wheelchair or bedside chair)?: A Little Help needed to walk in hospital room?: A Little Help needed climbing 3-5 steps with a railing? : A  Lot 6 Click Score: 19    End of Session Equipment Utilized During Treatment: Gait belt Activity Tolerance: Patient tolerated treatment well Patient left: in bed;with call bell/phone within reach;with nursing/sitter in room Nurse Communication: Mobility status PT Visit Diagnosis: Unsteadiness on feet (R26.81);Other abnormalities of gait and mobility (R26.89);History of falling (Z91.81)     Time: 7262-0355 PT Time Calculation (min) (ACUTE ONLY): 22 min  Charges:  $Gait Training: 8-22 mins                     Blanchard Kelch PT Acute Rehabilitation Services Pager [phone] Office [phone]     Rada Hay 01/08/2021, 3:42 PM

## 2021-01-09 NOTE — Progress Notes (Signed)
PROGRESS NOTE    Alvin Clark  AES:[id number] DOB: September 24, 1961 DOA: 12/27/2020 PCP: Patient, No Pcp Per     Brief Narrative:  Alvin Clark is a 60 y.o. BM PMHx Chronic ETOH     3/9, patient was trying to enter the home that he was evicted from. Homeowners called [place] PD found him frail and disoriented.  EMS was called in. EMS called patient's family who stated that patient had concerns for chronic alcoholism and developing memory issues for last 1 to 2 years.   In the ED, patient was afebrile, slightly tachycardic, blood pressure stable. Labs showed BUN/creatinine 64/1.83, from a baseline of normal 3 years ago.  AST/ALT elevated to 129/113, WC count slightly elevated 12.4. Blood alcohol level was negative.  Urine drug screen was negative urinalysis showed hazy yellow urine. CT scan of the head showed generalized volume loss advanced for his age, without acute abnormality. CT cervical spine showed advanced cervical spine degeneration with suspected multilevel mild spinal stenosis. Chest x-ray unremarkable. Patient was admitted to hospital service.   Subjective: 3/22 afebrile overnight, A/O x1 (does not know where, when, why), laying in bed comfortably.  Following commands     Assessment & Plan: Covid vaccination; unvaccinated   Principal Problem:   AKI (acute kidney injury) (HCC) Active Problems:   Confusion   Leukocytosis   Blurry vision, bilateral   Transaminitis   Constipation   Withdrawal symptoms, alcohol (HCC)   Protein-calorie malnutrition, severe  Acute metabolic Encephalopathy -Multifactorial EtOH abuse, malnutrition. -Treat underlying causes -Unsure of baseline. -Per EMR family reported memory issues for last 1 to 2 years. -CT scan head negative acute abnormality see results below -3/10 UDS negative -Per staff intermittent episodes of agitation, delirium -3/19 encephalopathy slightly worse today, however patient compliant and pleasant. -Seroquel 25 mg  daily  Chronic EtOH abuse -Continue folic acid 1 mg daily -Thiamine 100 mg daily  Normocytic anemia -Anemia panel consistent with normocytic anemia -Occult blood pending   AKI (baseline Cr<1) Lab Results  Component Value Date   CREATININE 0.87 01/08/2021   CREATININE 0.88 01/03/2021   CREATININE 0.94 01/01/2021   CREATININE 0.88 12/31/2020   CREATININE 0.88 12/30/2020  -At baseline  Hypokalemia -Potassium goal> 4 -Trend M/W/F  Hypomagnesmia -Magnesium goal> 2 -Trend M/W/F  Hypophosphatemia -Phosphorus goal> 2.5 -Trend M/W/F  Impaired mobility -PT recommends SNF -Ambulate patient every shift  Transaminitis -Most likely secondary to EtOH abuse will follow M/W/F -Resolved  Severe malnutrition -Severe Malnutritionrelated to chronic illness (chronic alcoholism)as evidenced by severe fat depletion,severe muscle depletion. -Encourage patient to to eat -Ensure TID  Constipation -Senokot, MiraLAX as needed   DVT prophylaxis: Lovenox Code Status: Full Family Communication:  Status is: Inpatient    Dispo: The patient is from: Home              Anticipated d/c is to: Awaiting SNF placement              Anticipated d/c date is:??              Patient currently unstable      Consultants:    Procedures/Significant Events:  3/10 CT head W0 contrast; no acute abnormality: Advanced generalized cerebral volume loss, white matter changes. 3/10 CT C-spine W0 contrast; advanced C-spine degeneration with suspected multilevel mild spinal stenosis   I have personally reviewed and interpreted all radiology studies and my findings are as above.  VENTILATOR SETTINGS:    Cultures   Antimicrobials:    Devices  LINES / TUBES:      Continuous Infusions: . sodium chloride 75 mL/hr at 01/09/21 0857     Objective: Vitals:   01/08/21 1637 01/08/21 1940 01/09/21 0644 01/09/21 1511  BP: 118/74 127/73 (!) 126/91 121/84  Pulse: (!) 101 97 (!) 101  93  Resp: 18 18  16   Temp: 98.5 F (36.9 C) 98.7 F (37.1 C) 98 F (36.7 C) 98.8 F (37.1 C)  TempSrc: Oral Oral Oral Oral  SpO2: 100% 98% 100% 99%  Weight:      Height:        Intake/Output Summary (Last 24 hours) at 01/09/2021 1837 Last data filed at 01/09/2021 1813 Gross per 24 hour  Intake 840 ml  Output 3601 ml  Net -2761 ml   Filed Weights   01/06/21 0500 01/07/21 0607 01/08/21 0500  Weight: 60.3 kg 60.6 kg 64.6 kg   Physical Exam:  General: A/O x1 (does not know where, when, why) No acute respiratory distress, cachectic Eyes: negative scleral hemorrhage, negative anisocoria, negative icterus ENT: Negative Runny nose, negative gingival bleeding, poor dentation Neck:  Negative scars, masses, torticollis, lymphadenopathy, JVD Lungs: Clear to auscultation bilaterally without wheezes or crackles Cardiovascular: Regular rate and rhythm without murmur gallop or rub normal S1 and S2 Abdomen: negative abdominal pain, nondistended, positive soft, bowel sounds, no rebound, no ascites, no appreciable mass Extremities: No significant cyanosis, clubbing, or edema bilateral lower extremities Skin: Negative rashes, lesions, ulcers Psychiatric:  Negative depression, negative anxiety, negative fatigue, negative mania  Central nervous system:  Cranial nerves II through XII intact, tongue/uvula midline, all extremities muscle strength 5/5, sensation intact throughout, negative dysarthria, negative expressive aphasia, negative receptive aphasia.  .     Data Reviewed: Care during the described time interval was provided by me .  I have reviewed this patient's available data, including medical history, events of note, physical examination, and all test results as part of my evaluation.  CBC: Recent Labs  Lab 01/03/21 0348 01/08/21 0321  WBC 6.5 8.8  NEUTROABS 3.9  --   HGB 10.2* 9.6*  HCT 32.0* 30.6*  MCV 101.3* 102.7*  PLT 171 187   Basic Metabolic Panel: Recent Labs  Lab  01/03/21 0348 01/08/21 0321  NA 143 137  K 3.9 4.2  CL 107 105  CO2 29 24  GLUCOSE 97 122*  BUN 8 17  CREATININE 0.88 0.87  CALCIUM 9.5 9.2  MG 1.5* 1.7  PHOS 2.7 3.2   GFR: Estimated Creatinine Clearance: 84.6 mL/min (by C-G formula based on SCr of 0.87 mg/dL). Liver Function Tests: Recent Labs  Lab 01/08/21 0321  AST 49*  ALT 36  ALKPHOS 92  BILITOT 0.7  PROT 6.7  ALBUMIN 3.0*   No results for input(s): LIPASE, AMYLASE in the last 168 hours. No results for input(s): AMMONIA in the last 168 hours. Coagulation Profile: No results for input(s): INR, PROTIME in the last 168 hours. Cardiac Enzymes: No results for input(s): CKTOTAL, CKMB, CKMBINDEX, TROPONINI in the last 168 hours. BNP (last 3 results) No results for input(s): PROBNP in the last 8760 hours. HbA1C: No results for input(s): HGBA1C in the last 72 hours. CBG: No results for input(s): GLUCAP in the last 168 hours. Lipid Profile: No results for input(s): CHOL, HDL, LDLCALC, TRIG, CHOLHDL, LDLDIRECT in the last 72 hours. Thyroid Function Tests: No results for input(s): TSH, T4TOTAL, FREET4, T3FREE, THYROIDAB in the last 72 hours. Anemia Panel: No results for input(s): VITAMINB12, FOLATE, FERRITIN, TIBC, IRON, RETICCTPCT in  the last 72 hours. Sepsis Labs: No results for input(s): PROCALCITON, LATICACIDVEN in the last 168 hours.  No results found for this or any previous visit (from the past 240 hour(s)).       Radiology Studies: No results found.      Scheduled Meds: . enoxaparin (LOVENOX) injection  40 mg Subcutaneous Q24H  . feeding supplement  237 mL Oral TID BM  . folic acid  1 mg Oral Daily  . multivitamin with minerals  1 tablet Oral Daily  . polyethylene glycol  17 g Oral Daily  . QUEtiapine  25 mg Oral BID  . sodium chloride flush  3 mL Intravenous Q12H  . thiamine  100 mg Oral Daily   Or  . thiamine  100 mg Intravenous Daily   Continuous Infusions: . sodium chloride 75 mL/hr at  01/09/21 0857     LOS: 12 days    Time spent:40 min    Jamarkis Branam, Roselind Messier, MD [facility]   If 7PM-7AM, please contact night-coverage 01/09/2021, 6:37 PM

## 2021-01-10 DIAGNOSIS — G9341 Metabolic encephalopathy: Secondary | ICD-10-CM

## 2021-01-10 DIAGNOSIS — K5900 Constipation, unspecified: Secondary | ICD-10-CM

## 2021-01-10 LAB — CBC
HCT: 32.2 % — ABNORMAL LOW (ref 39.0–52.0)
Hemoglobin: 10.1 g/dL — ABNORMAL LOW (ref 13.0–17.0)
MCH: 32 pg (ref 26.0–34.0)
MCHC: 31.4 g/dL (ref 30.0–36.0)
MCV: 101.9 fL — ABNORMAL HIGH (ref 80.0–100.0)
Platelets: 201 10*3/uL (ref 150–400)
RBC: 3.16 MIL/uL — ABNORMAL LOW (ref 4.22–5.81)
RDW: 15.2 % (ref 11.5–15.5)
WBC: 7 10*3/uL (ref 4.0–10.5)
nRBC: 0 % (ref 0.0–0.2)

## 2021-01-10 LAB — COMPREHENSIVE METABOLIC PANEL
ALT: 39 U/L (ref 0–44)
AST: 54 U/L — ABNORMAL HIGH (ref 15–41)
Albumin: 3 g/dL — ABNORMAL LOW (ref 3.5–5.0)
Alkaline Phosphatase: 92 U/L (ref 38–126)
Anion gap: 8 (ref 5–15)
BUN: 14 mg/dL (ref 6–20)
CO2: 24 mmol/L (ref 22–32)
Calcium: 9.8 mg/dL (ref 8.9–10.3)
Chloride: 109 mmol/L (ref 98–111)
Creatinine, Ser: 0.88 mg/dL (ref 0.61–1.24)
GFR, Estimated: >60 mL/min (ref >60)
Glucose, Bld: 101 mg/dL — ABNORMAL HIGH (ref 70–99)
Potassium: 4.2 mmol/L (ref 3.5–5.1)
Sodium: 141 mmol/L (ref 135–145)
Total Bilirubin: 0.4 mg/dL (ref 0.3–1.2)
Total Protein: 7.1 g/dL (ref 6.5–8.1)

## 2021-01-10 LAB — PHOSPHORUS: Phosphorus: 4.2 mg/dL (ref 2.5–4.6)

## 2021-01-10 LAB — MAGNESIUM: Magnesium: 1.9 mg/dL (ref 1.7–2.4)

## 2021-01-10 MED ORDER — MAGNESIUM SULFATE 2 GM/50ML IV SOLN
2.0000 g | Freq: Once | INTRAVENOUS | Status: DC
  Filled 2021-01-10: qty 50

## 2021-01-10 NOTE — Progress Notes (Signed)
PROGRESS NOTE    TEVYN CODD  FUX:[id number] DOB: August 08, 1961 DOA: 12/27/2020 PCP: Patient, No Pcp Per  Brief Narrative: The patient is a 60 year old African-American male with chronic alcoholism who was trying to enter the home that he was evicted from on 12/27/2020.  Homeowners called [employer] and found to be [name].  EMS was called who then called the patient's family and they stated that the patient had concerns for chronic alcoholism and developing memory issues for the last 1 to 2 years.  He was brought to the ED for further evaluation and in the ED he was found to be afebrile, slightly tachycardic blood pressures stable.  Labs showed an AKI and abnormal LFTs as well as slightly elevated white blood cell count of 12.4.  Blood alcohol level content was negative and UDS was negative.  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  Chest x-ray was unremarkable and he was admitted to the hospitalist service for his confusion and has been worked up.  He continues to have intermittent encephalopathy and he is monitor for alcohol withdrawal continues to be on folic acid, multivitamin as well as thiamine.  PT OT evaluated and recommending SNF however he is a difficult placement given his lack of insurance as well as homeless issues.  Not able to stand up by himself when he tries to and has fallen a few times  Assessment & Plan:   Principal Problem:   AKI (acute kidney injury) (HCC) Active Problems:   Confusion   Leukocytosis   Blurry vision, bilateral   Transaminitis   Constipation   Withdrawal symptoms, alcohol (HCC)   Protein-calorie malnutrition, severe   Acute metabolic Encephalopathy Chronic involving memory issues -Multifactorial EtOH abuse, malnutrition.   Patient has chronic alcoholism and family reports worsening memory issues for the last 1 to 2 years -He is brought in for disorientation and abnormal behavior and dehydration may have precipitated an acute delirium and disorientation -His mentation and mental status continues to be waxing and waning and has intermittent episodes of delirium and agitation -Patient tends to remain calm in the morning and starts to get agitated in the afternoon -Treat underlying causes -Unsure of baseline. -Per EMR family reported memory issues for last 1 to 2 years. -CT scan head negative but did show generalized volume loss which was advanced for his age -1/10 UDS negative -Per staff intermittent episodes of agitation, delirium and has [name] multiple times  -Continues to have Intermittent Encephalopathy slightly worse today, however patient compliant and pleasant. -C/w Quetiapine 25 mg po BID and with Haloperidol Lactate injection 2 mg q6hptn Agitation but will hold if QTC >470  Chronic EtOH Abuse -Currently not in Withdrawals -C/w folic acid 1 mg p.o. daily, multivitamin with minerals 1 tab p.o. daily, and thiamine 100 mg p.o./IV daily  Hypokalemia -C/w a Potassium goal> 4 -K+ was 4.2 -Continue to Monitor and Replete as Necessary -Repeat CMP intermittently  Hypomagnesmia -C/w a Magnesium goal> 2 -Mag Level was 1.9 and was replete with IV Mag Sulfate 1 gram -Continue to Monitor and Replete as Necessary -Repeat Mag Level intermittently   Hypophosphatemia -C/w a Phosphorus goal> 2.5 -Phos Level this AM was 4.2 -Continue to Monitor and Replete as Necessary -Repeat Phos Level intermittently   Impaired Mobility -PT continues recommends  SNF -Ambulate patient every shift as able and has a Recruitment consultant given Multiple FALLS  Severe Protein Calorie Malnutrition -Severe Malnutritionrelated to chronic illness (chronic alcoholism)as evidenced by severe fat depletion,severe muscle  depletion. -Encourage patient to to eat -Ensure TID adamant with minerals daily  Constipation -Continue Bowel Regimen with Miralax 17 g po Daily -If Necessary will place on Senna-Docusate 1 tab po BID and consider PRN Bisacodyl Suppositories   AKI -Resolved. Patient's BUN/Cr is now 14/0.88 and at baseline -Continue to Monitor and Trend Intermittently  -Was getting IVF with NS at 75 mL/hr but will stop  Elevated AST/Abnormal LFTs -Most Likely 2/2 to EtOH Abuse -Patient's AST has gone from 65 -> 49 -> 54 -ALT has gone from 59 -> 39 and normalized -Continue to Monitor and Trend Intermittently and repeat at least weekly  Macrocytic Anemia -Patient's Hgb/Hct has been relatively stable but slowly dropped since admission and is now 10.1/32.2 -Anemia Panel recently done 3/19 showed an iron level of 62, U IBC of 262, TIBC of 324, saturation ratios of 19%, ferritin level 274, folate level of 14.0 and vitamin B12 of 288  -FOBT is pending  -Continue to Monitor for S/Sx of Bleeding; Currently no overt bleeding noted -Repeat CBC in the AM   DVT prophylaxis: Enoxaparin 40 mg sq q24h Code Status: FULL CODE Family Communication: No family present at bedside  Disposition Plan: Unsafe D/C Home as he is difficult to place; Needs SNF and He is Homeless with Medical needs   Status is: Inpatient  Remains inpatient appropriate because:Unsafe d/c plan, IV treatments appropriate due to intensity of illness or inability to take PO and Inpatient level of care appropriate due to severity of illness   Dispo: The patient is from: Home              Anticipated d/c is to: SNF              Patient currently is medically stable to d/c.   Difficult to place patient Yes  Consultants:   None  Procedures: None  Antimicrobials:  Anti-infectives (From admission, onward)   None        Subjective: Seen and examined at bedside and was pleasant this morning and wanting to go home.  Felt good.  No chest  pain, lightheadedness or dizziness.  No other concerns or complaints at this time.  Objective: Vitals:   01/09/21 0644 01/09/21 1511 01/09/21 1912 01/10/21 0350  BP: (!) 126/91 121/84 130/77 107/74  Pulse: (!) 101 93 93 84  Resp:  16 16 18   Temp: 98 F (36.7 C) 98.8 F (37.1 C) 98.6 F (37 C) 98.1 F (36.7 C)  TempSrc: Oral Oral    SpO2: 100% 99% 100% 100%  Weight:      Height:        Intake/Output Summary (Last 24 hours) at 01/10/2021 1203 Last data filed at 01/10/2021 1055 Gross per 24 hour  Intake 720 ml  Output 550 ml  Net 170 ml   Filed Weights   01/06/21 0500 01/07/21 0607 01/08/21 0500  Weight: 60.3 kg 60.6 kg 64.6 kg   Examination: Physical Exam:  Constitutional: The patient is a thin disheveled African-American male currently in NAD and appears calm Eyes: Lids and conjunctivae normal, sclerae anicteric  ENMT: External Ears, Nose appear normal. Grossly normal hearing. Neck: Appears normal, supple, no cervical masses, normal ROM, no appreciable thyromegaly; no JVD Respiratory: Diminished to auscultation bilaterally, no wheezing, rales, rhonchi or crackles. Normal respiratory  effort and patient is not tachypenic. No accessory muscle use.  Cardiovascular: RRR, no murmurs / rubs / gallops. S1 and S2 auscultated.  No appreciable extremity edema Abdomen: Soft, non-tender, non-distended.  Bowel sounds positive.  GU: Deferred. Musculoskeletal: No clubbing / cyanosis of digits/nails. No joint deformity upper and lower extremities.  Skin: No rashes, lesions, ulcers on limited skin evaluation. No induration; Warm and dry.  Neurologic: CN 2-12 grossly intact with no focal deficits.  Romberg sign and cerebellar reflexes not assessed.  Psychiatric: Mildly impaired judgment and insight.  He is awake and alert but not fully oriented. Normal mood and appropriate affect.   Data Reviewed: I have personally reviewed following labs and imaging studies  CBC: Recent Labs  Lab  01/08/21 0321 01/10/21 0349  WBC 8.8 7.0  HGB 9.6* 10.1*  HCT 30.6* 32.2*  MCV 102.7* 101.9*  PLT 187 201   Basic Metabolic Panel: Recent Labs  Lab 01/08/21 0321 01/10/21 0349  NA 137 141  K 4.2 4.2  CL 105 109  CO2 24 24  GLUCOSE 122* 101*  BUN 17 14  CREATININE 0.87 0.88  CALCIUM 9.2 9.8  MG 1.7 1.9  PHOS 3.2 4.2   GFR: Estimated Creatinine Clearance: 83.6 mL/min (by C-G formula based on SCr of 0.88 mg/dL). Liver Function Tests: Recent Labs  Lab 01/08/21 0321 01/10/21 0349  AST 49* 54*  ALT 36 39  ALKPHOS 92 92  BILITOT 0.7 0.4  PROT 6.7 7.1  ALBUMIN 3.0* 3.0*   No results for input(s): LIPASE, AMYLASE in the last 168 hours. No results for input(s): AMMONIA in the last 168 hours. Coagulation Profile: No results for input(s): INR, PROTIME in the last 168 hours. Cardiac Enzymes: No results for input(s): CKTOTAL, CKMB, CKMBINDEX, TROPONINI in the last 168 hours. BNP (last 3 results) No results for input(s): PROBNP in the last 8760 hours. HbA1C: No results for input(s): HGBA1C in the last 72 hours. CBG: No results for input(s): GLUCAP in the last 168 hours. Lipid Profile: No results for input(s): CHOL, HDL, LDLCALC, TRIG, CHOLHDL, LDLDIRECT in the last 72 hours. Thyroid Function Tests: No results for input(s): TSH, T4TOTAL, FREET4, T3FREE, THYROIDAB in the last 72 hours. Anemia Panel: No results for input(s): VITAMINB12, FOLATE, FERRITIN, TIBC, IRON, RETICCTPCT in the last 72 hours. Sepsis Labs: No results for input(s): PROCALCITON, LATICACIDVEN in the last 168 hours.  No results found for this or any previous visit (from the past 240 hour(s)).   RN Pressure Injury Documentation:     Estimated body mass index is 21.03 kg/m as calculated from the following:   Height as of this encounter: 5\' 9"  (1.753 m).   Weight as of this encounter: 64.6 kg.  Malnutrition Type:  Nutrition Problem: Severe Malnutrition Etiology: chronic illness (chronic  alcoholism)  Malnutrition Characteristics:  Signs/Symptoms: severe fat depletion,severe muscle depletion  Nutrition Interventions:  Interventions: Ensure Enlive (each supplement provides 350kcal and 20 grams of protein),MVI  Radiology Studies: No results found.  Scheduled Meds: . enoxaparin (LOVENOX) injection  40 mg Subcutaneous Q24H  . feeding supplement  237 mL Oral TID BM  . folic acid  1 mg Oral Daily  . multivitamin with minerals  1 tablet Oral Daily  . polyethylene glycol  17 g Oral Daily  . QUEtiapine  25 mg Oral BID  . sodium chloride flush  3 mL Intravenous Q12H  . thiamine  100 mg Oral Daily   Or  . thiamine  100 mg Intravenous Daily  Continuous Infusions: . magnesium sulfate bolus IVPB Stopped (01/10/21 0933)    LOS: 13 days   Merlene Laughter, DO [facility] PAGER is on AMION  If 7PM-7AM, please contact night-coverage [email or web address]

## 2021-01-10 NOTE — TOC Progression Note (Signed)
Transition of Care [facility]) - Progression Note    Patient Details  Name: Alvin Clark MRN: [id number] Date of Birth: [birth year]  Transition of Care [facility]) CM/SW Contact  Darleene Cleaver, [place] Phone Number: 01/10/2021, 12:10 PM  Clinical Narrative:     Patient is now Medicaid pending, and financial counseling has started disability application.  CSW faxed updated clinicals to SNFs to see if any facility will offer a bed for patient.  CSW also noted to SNFs patient is Medicaid pending and disability application has been as started.   Expected Discharge Plan: Skilled Nursing Facility Barriers to Discharge: Financial Resources,Homeless with medical needs,Continued Medical Work up,No SNF bed,Requiring sitter/restraints,SNF Pending payor source - LOG,SNF Pending Medicaid,Family Issues  Expected Discharge Plan and Services Expected Discharge Plan: Skilled Nursing Facility In-house Referral: Clinical Social Work,Financial Counselor     Living arrangements for the past 2 months: No permanent address                                       Social Determinants of Health (SDOH) Interventions    Readmission Risk Interventions No flowsheet data found.

## 2021-01-10 NOTE — Progress Notes (Signed)
Physical Therapy Treatment Patient Details Name: Alvin Clark MRN: [id number] DOB: 09-28-1961 Today's Date: 01/10/2021    History of Present Illness Pt admitted on 12/27/20 with Acute metabolic encephalopathy, Chronic evolving memory issues, Chronic alcoholism. He had a fall out of bed on 3/11.    PT Comments    Pt in bed with safety sitter in room.  Assisted OOB to [facility] first due to incont.  Then assisted to bathroom.  General Gait Details: VERY unsteady gait with poor self corrective responce.  Stumbling.  Staggering.  Use of walker is not helpful due to impulsive behavior.  Assisted to bathroom for a quick stand up wash up and also assisted with amb in hallway.  25% VC's to slow pace to increase balance.  Follow Up Recommendations  SNF     Equipment Recommendations  None recommended by PT    Recommendations for Other Services       Precautions / Restrictions Precautions Precautions: Fall Precaution Comments: incontinent Restrictions Weight Bearing Restrictions: No    Mobility  Bed Mobility Overal bed mobility: Needs Assistance Bed Mobility: Supine to Sit;Sit to Supine     Supine to sit: Supervision Sit to supine: Supervision   General bed mobility comments: supervision for safety, impulsive "jumper"    Transfers Overall transfer level: Needs assistance Equipment used: Rolling walker (2 wheeled) Transfers: Sit to/from [employer] Sit to Stand: Min guard Stand pivot transfers: Min guard       General transfer comment: steady assist to rise VC's safety Impulsive "jumper"  Ambulation/Gait Ambulation/Gait assistance: Min assist;Mod assist Gait Distance (Feet): 155 Feet Assistive device: Rolling walker (2 wheeled) Gait Pattern/deviations: Decreased stride length;Shuffle;Trunk flexed;Scissoring;Narrow base of support Gait velocity: decreased   General Gait Details: VERY unsteady gait with poor self corrective responce.  Stumbling.  Staggering.  Use  of walker is not helpful due to impulsive behavior.  Assisted to bathroom for a quick stand up wash up and also assisted with amb in hallway.  25% VC's to slow pace to increase balance.   Stairs             Wheelchair Mobility    Modified Rankin (Stroke Patients Only)       Balance                                            Cognition Arousal/Alertness: Awake/alert Behavior During Therapy: Restless Overall Cognitive Status: No family/caregiver present to determine baseline cognitive functioning                   Orientation Level: Disoriented to;Time;Situation       Safety/Judgement: Decreased awareness of safety Awareness: Emergent Problem Solving: Requires verbal cues;Requires tactile cues General Comments: pleasant. Pt impulsive, improves with cues for safety.      Exercises      General Comments        Pertinent Vitals/Pain Pain Assessment: No/denies pain    Home Living                      Prior Function            PT Goals (current goals can now be found in the care plan section) Progress towards PT goals: Progressing toward goals    Frequency    Min 2X/week      PT Plan Current plan remains appropriate;Frequency  needs to be updated    Co-evaluation              AM-PAC PT "6 Clicks" Mobility   Outcome Measure  Help needed turning from your back to your side while in a flat bed without using bedrails?: A Little Help needed moving from lying on your back to sitting on the side of a flat bed without using bedrails?: A Little Help needed moving to and from a bed to a chair (including a wheelchair)?: A Little Help needed standing up from a chair using your arms (e.g., wheelchair or bedside chair)?: A Little Help needed to walk in hospital room?: A Little Help needed climbing 3-5 steps with a railing? : A Little 6 Click Score: 18    End of Session Equipment Utilized During Treatment: Gait  belt Activity Tolerance: Patient tolerated treatment well Patient left: in bed;with call bell/phone within reach;with nursing/sitter in room Nurse Communication: Mobility status PT Visit Diagnosis: Unsteadiness on feet (R26.81);Other abnormalities of gait and mobility (R26.89);History of falling (Z91.81)     Time: 6761-9509 PT Time Calculation (min) (ACUTE ONLY): 25 min  Charges:  $Gait Training: 8-22 mins $Therapeutic Activity: 8-22 mins                     Felecia Shelling  PTA Acute  Rehabilitation Services Pager      [phone] Office      [phone]

## 2021-01-10 NOTE — Progress Notes (Signed)
Nutrition Follow-up  DOCUMENTATION CODES:   Severe malnutrition in context of chronic illness,Underweight  INTERVENTION:   -Ensure Enlive po TID, each supplement provides 350 kcal and 20 grams of protein  -Multivitamin with minerals daily  NUTRITION DIAGNOSIS:   Severe Malnutrition related to chronic illness (chronic alcoholism) as evidenced by severe fat depletion,severe muscle depletion. Ongoing.  GOAL:   Patient will meet greater than or equal to 90% of their needs  Progressing.  MONITOR:   PO intake,Supplement acceptance,Labs,Weight trends,I & O's  ASSESSMENT:   60 y.o. male with PMH significant for chronic alcoholism.    3/9, patient was trying to enter the home that he was evicted from.  Homeowners called  PD found him frail and disoriented.  EMS was called in. EMS called patient's family who stated that patient had concerns for chronic alcoholism and developing memory issues for last 1 to 2 years. Admitted for acute metabolic encephalopathy.  Patient currently alert/oriented x 1.  Consuming 100% of meals at this time. Accepting Ensure supplements. Per chart review, plan is for SNF at discharge.  Admission weight: 123 lbs. Current weight: 142 lbs.  Medications: Folic acid, Multivitamin with minerals daily, Miralax, Thiamine   Labs reviewed.  Diet Order:   Diet Order            Diet heart healthy/carb modified Room service appropriate? Yes; Fluid consistency: Thin  Diet effective now                 EDUCATION NEEDS:   No education needs have been identified at this time  Skin:  Skin Assessment: Reviewed RN Assessment  Last BM:  3/20  Height:   Ht Readings from Last 1 Encounters:  12/27/20 5\' 9"  (1.753 m)    Weight:   Wt Readings from Last 1 Encounters:  01/08/21 64.6 kg   BMI:  Body mass index is 21.03 kg/m.  Estimated Nutritional Needs:   Kcal:  2150-2350  Protein:  85-105g  Fluid:  2.1L/day   09-28-1990, MS, RD,  LDN Inpatient Clinical Dietitian Contact information available via Amion

## 2021-01-11 LAB — SARS CORONAVIRUS 2 (TAT 6-24 HRS): SARS Coronavirus 2: NEGATIVE

## 2021-01-11 NOTE — Progress Notes (Signed)
PROGRESS NOTE    Alvin Clark  LOV:[id number] DOB: 03/21/1961 DOA: 12/27/2020 PCP: Patient, No Pcp Per  Brief Narrative: The patient is a 60 year old African-American male with chronic alcoholism who was trying to enter the home that he was evicted from on 12/27/2020.  Homeowners called [employer] and found to be [name].  EMS was called who then called the patient's family and they stated that the patient had concerns for chronic alcoholism and developing memory issues for the last 1 to 2 years.  He was brought to the ED for further evaluation and in the ED he was found to be afebrile, slightly tachycardic blood pressures stable.  Labs showed an AKI and abnormal LFTs as well as slightly elevated white blood cell count of 12.4.  Blood alcohol level content was negative and UDS was negative.  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  Chest x-ray was unremarkable and he was admitted to the hospitalist service for his confusion and has been worked up.  He continues to have intermittent encephalopathy and he is monitor for alcohol withdrawal continues to be on folic acid, multivitamin as well as thiamine.  PT OT evaluated and recommending SNF however he is a difficult placement given his lack of insurance as well as homeless issues.  Not able to stand up by himself when he tries to and has fallen a few times. Still awating SNF placement for Safe Discharge Disposition.   Assessment & Plan:   Principal Problem:   AKI (acute kidney injury) (HCC) Active Problems:   Confusion   Leukocytosis   Blurry vision, bilateral   Transaminitis   Constipation   Withdrawal symptoms, alcohol (HCC)   Protein-calorie malnutrition, severe   Acute metabolic Encephalopathy Chronic  involving memory issues -Multifactorial EtOH abuse, malnutrition.  Patient has chronic alcoholism and family reports worsening memory issues for the last 1 to 2 years -He is brought in for disorientation and abnormal behavior and dehydration may have precipitated an acute delirium and disorientation -His mentation and mental status continues to be waxing and waning and has intermittent episodes of delirium and agitation; Appeared pleasant today  -Patient tends to remain calm in the morning and starts to get agitated in the afternoon -Treat underlying causes -Unsure of baseline. -Per EMR family reported memory issues for last 1 to 2 years. -CT scan head negative but did show generalized volume loss which was advanced for his age -21/10 UDS negative -Per staff intermittent episodes of agitation, delirium and has [name] multiple times  -Continues to have Intermittent Encephalopathy slightly worse today, however patient compliant and pleasant. -C/w Quetiapine 25 mg po BID and with Haloperidol Lactate injection 2 mg q6hptn Agitation but will hold if QTC >470  Chronic EtOH Abuse -Currently not in Withdrawals -C/w folic acid 1 mg p.o. daily, multivitamin with minerals 1 tab p.o. daily, and thiamine 100 mg p.o./IV daily  Hypokalemia -C/w a Potassium goal> 4 -K+ was 4.2 on last chek  -Continue to Monitor and Replete as Necessary -Repeat CMP intermittently  Hypomagnesmia -C/w a Magnesium goal> 2 -Mag Level was 1.9 and was replete with IV Mag Sulfate 1 gram yesterday  -Continue to Monitor and Replete as Necessary -Repeat Mag Level intermittently   Hypophosphatemia -C/w a Phosphorus goal> 2.5 -Phos Level was 4.2 on  last check  -Continue to Monitor and Replete as Necessary -Repeat Phos Level intermittently   Impaired Mobility -PT continues recommends SNF -Ambulate patient every shift as able and has a Recruitment consultantafety sitter given Multiple FALLS  Severe Protein Calorie Malnutrition -Severe  Malnutritionrelated to chronic illness (chronic alcoholism)as evidenced by severe fat depletion,severe muscle depletion. 0Encourage patient to to eat -Ensure TID and MVI with minerals daily  Constipation -Continue Bowel Regimen with Miralax 17 g po Daily -If Necessary will place on Senna-Docusate 1 tab po BID and consider PRN Bisacodyl Suppositories   AKI -Resolved. Patient's BUN/Cr is now 14/0.88 and at baseline -Continue to Monitor and Trend Intermittently  -Was getting IVF with NS at 75 mL/hr but stopped   Elevated AST/Abnormal LFTs -Most Likely 2/2 to EtOH Abuse -Patient's AST has gone from 65 -> 49 -> 54 on last check  -ALT has gone from 59 -> 39 and normalized -Continue to Monitor and Trend Intermittently and repeat at least weekly  Macrocytic Anemia -Patient's Hgb/Hct has been relatively stable but slowly dropped since admission and is now 10.1/32.2 -Anemia Panel recently done 3/19 showed an iron level of 62, U IBC of 262, TIBC of 324, saturation ratios of 19%, ferritin level 274, folate level of 14.0 and vitamin B12 of 288  -FOBT is pending  -Continue to Monitor for S/Sx of Bleeding; Currently no overt bleeding noted -Repeat CBC in the AM   DVT prophylaxis: Enoxaparin 40 mg sq q24h Code Status: FULL CODE Family Communication: No family present at bedside  Disposition Plan: Unsafe D/C Home as he is difficult to place; Needs SNF and He is Homeless with Medical needs   Status is: Inpatient  Remains inpatient appropriate because:Unsafe d/c plan, IV treatments appropriate due to intensity of illness or inability to take PO and Inpatient level of care appropriate due to severity of illness   Dispo: The patient is from: Home              Anticipated d/c is to: SNF              Patient currently is medically stable to d/c.   Difficult to place patient Yes  Consultants:   None  Procedures: None  Antimicrobials:  Anti-infectives (From admission, onward)   None         Subjective: Seen and examined at bedside and was resting in the bed and had no complaints and denied pain.  No chest pain, nausea or vomiting.  No other concerns or complaints at this time.  Sitter remains at bedside  Objective: Vitals:   01/10/21 1318 01/10/21 2020 01/11/21 0500 01/11/21 0550  BP: 114/79 117/79  120/87  Pulse: (!) 107 89  (!) 105  Resp:  18    Temp: 98.1 F (36.7 C) 98.4 F (36.9 C)  98.1 F (36.7 C)  TempSrc: Oral Oral  Oral  SpO2: 100% 99%  100%  Weight:   64.6 kg   Height:        Intake/Output Summary (Last 24 hours) at 01/11/2021 1246 Last data filed at 01/11/2021 0600 Gross per 24 hour  Intake 1610.95 ml  Output -  Net 1610.95 ml   Filed Weights   01/07/21 0607 01/08/21 0500 01/11/21 0500  Weight: 60.6 kg 64.6 kg 64.6 kg   Examination: Physical Exam:  Constitutional: The patient is a thin disheveled African-American male currently no acute distress appears calm Eyes: Lids and conjunctivae normal, sclerae anicteric  ENMT: External Ears, Nose appear normal. Grossly normal  hearing.  Neck: Appears normal, supple, no cervical masses, normal ROM, no appreciable thyromegaly; no JVD Respiratory: Diminished to auscultation bilaterally, no wheezing, rales, rhonchi or crackles. Normal respiratory effort and patient is not tachypenic. No accessory muscle use.  Cardiovascular: RRR, no murmurs / rubs / gallops. S1 and S2 auscultated. No extremity edema. Abdomen: Soft, non-tender, non-distended. Bowel sounds positive.  GU: Deferred. Musculoskeletal: No clubbing / cyanosis of digits/nails. No joint deformity upper and lower extremities.  Skin: No rashes, lesions, ulcers on limited skin evaluation. No induration; Warm and dry.  Neurologic: CN 2-12 grossly intact with no focal deficits. Romberg sign and cerebellar reflexes not assessed.  Psychiatric: Mildly impaired judgment and insight. He is awake and alert. Normal mood and appropriate affect.   Data Reviewed: I  have personally reviewed following labs and imaging studies  CBC: Recent Labs  Lab 01/08/21 0321 01/10/21 0349  WBC 8.8 7.0  HGB 9.6* 10.1*  HCT 30.6* 32.2*  MCV 102.7* 101.9*  PLT 187 201   Basic Metabolic Panel: Recent Labs  Lab 01/08/21 0321 01/10/21 0349  NA 137 141  K 4.2 4.2  CL 105 109  CO2 24 24  GLUCOSE 122* 101*  BUN 17 14  CREATININE 0.87 0.88  CALCIUM 9.2 9.8  MG 1.7 1.9  PHOS 3.2 4.2   GFR: Estimated Creatinine Clearance: 83.6 mL/min (by C-G formula based on SCr of 0.88 mg/dL). Liver Function Tests: Recent Labs  Lab 01/08/21 0321 01/10/21 0349  AST 49* 54*  ALT 36 39  ALKPHOS 92 92  BILITOT 0.7 0.4  PROT 6.7 7.1  ALBUMIN 3.0* 3.0*   No results for input(s): LIPASE, AMYLASE in the last 168 hours. No results for input(s): AMMONIA in the last 168 hours. Coagulation Profile: No results for input(s): INR, PROTIME in the last 168 hours. Cardiac Enzymes: No results for input(s): CKTOTAL, CKMB, CKMBINDEX, TROPONINI in the last 168 hours. BNP (last 3 results) No results for input(s): PROBNP in the last 8760 hours. HbA1C: No results for input(s): HGBA1C in the last 72 hours. CBG: No results for input(s): GLUCAP in the last 168 hours. Lipid Profile: No results for input(s): CHOL, HDL, LDLCALC, TRIG, CHOLHDL, LDLDIRECT in the last 72 hours. Thyroid Function Tests: No results for input(s): TSH, T4TOTAL, FREET4, T3FREE, THYROIDAB in the last 72 hours. Anemia Panel: No results for input(s): VITAMINB12, FOLATE, FERRITIN, TIBC, IRON, RETICCTPCT in the last 72 hours. Sepsis Labs: No results for input(s): PROCALCITON, LATICACIDVEN in the last 168 hours.  No results found for this or any previous visit (from the past 240 hour(s)).   RN Pressure Injury Documentation:     Estimated body mass index is 21.03 kg/m as calculated from the following:   Height as of this encounter: 5\' 9"  (1.753 m).   Weight as of this encounter: 64.6 kg.  Malnutrition  Type:  Nutrition Problem: Severe Malnutrition Etiology: chronic illness (chronic alcoholism)  Malnutrition Characteristics:  Signs/Symptoms: severe fat depletion,severe muscle depletion  Nutrition Interventions:  Interventions: Ensure Enlive (each supplement provides 350kcal and 20 grams of protein),MVI  Radiology Studies: No results found.  Scheduled Meds: . enoxaparin (LOVENOX) injection  40 mg Subcutaneous Q24H  . feeding supplement  237 mL Oral TID BM  . folic acid  1 mg Oral Daily  . multivitamin with minerals  1 tablet Oral Daily  . polyethylene glycol  17 g Oral Daily  . QUEtiapine  25 mg Oral BID  . sodium chloride flush  3 mL Intravenous Q12H  .  thiamine  100 mg Oral Daily   Or  . thiamine  100 mg Intravenous Daily   Continuous Infusions: . magnesium sulfate bolus IVPB 50 mL/hr at 01/10/21 1419    LOS: 14 days   Merlene Laughter, DO [facility] PAGER is on AMION  If 7PM-7AM, please contact night-coverage [email or web address]

## 2021-01-11 NOTE — TOC Progression Note (Addendum)
Transition of Care [facility]) - Progression Note    Patient Details  Name: RICKI CLACK MRN: [id number] Date of Birth: 1961-10-01  Transition of Care [facility]) CM/SW Contact  Darleene Cleaver, [place] Phone Number: 01/11/2021, 3:43 PM  Clinical Narrative:    CSW received a phone call from [facility] and also [facility] Team Lead to discuss patient.  Per Whitney Post at [facility], they will accept patient with a 30 day LOG for room and board and therapy.  CSW faxed requested LOG form to [facility] office to process the LOG.  CSW updated patient's sister that the LOG has almost been approved pending SNF administrator's agreement if they can accept patient.  CSW to continue to follow patient's progress throughout discharge planning.   Expected Discharge Plan: Skilled Nursing Facility Barriers to Discharge: Financial Resources,Homeless with medical needs,Continued Medical Work up,No SNF bed,Requiring sitter/restraints,SNF Pending payor source - LOG,SNF Pending Medicaid,Family Issues  Expected Discharge Plan and Services Expected Discharge Plan: Skilled Nursing Facility In-house Referral: Clinical Social Work,Financial Counselor     Living arrangements for the past 2 months: No permanent address                                       Social Determinants of Health (SDOH) Interventions    Readmission Risk Interventions No flowsheet data found.

## 2021-01-12 LAB — CBC
HCT: 34 % — ABNORMAL LOW (ref 39.0–52.0)
Hemoglobin: 10.8 g/dL — ABNORMAL LOW (ref 13.0–17.0)
MCH: 32.7 pg (ref 26.0–34.0)
MCHC: 31.8 g/dL (ref 30.0–36.0)
MCV: 103 fL — ABNORMAL HIGH (ref 80.0–100.0)
Platelets: 222 10*3/uL (ref 150–400)
RBC: 3.3 MIL/uL — ABNORMAL LOW (ref 4.22–5.81)
RDW: 15.5 % (ref 11.5–15.5)
WBC: 9.2 10*3/uL (ref 4.0–10.5)
nRBC: 0 % (ref 0.0–0.2)

## 2021-01-12 LAB — MAGNESIUM: Magnesium: 1.9 mg/dL (ref 1.7–2.4)

## 2021-01-12 LAB — COMPREHENSIVE METABOLIC PANEL
ALT: 54 U/L — ABNORMAL HIGH (ref 0–44)
AST: 74 U/L — ABNORMAL HIGH (ref 15–41)
Albumin: 3.6 g/dL (ref 3.5–5.0)
Alkaline Phosphatase: 93 U/L (ref 38–126)
Anion gap: 11 (ref 5–15)
BUN: 17 mg/dL (ref 6–20)
CO2: 20 mmol/L — ABNORMAL LOW (ref 22–32)
Calcium: 9.8 mg/dL (ref 8.9–10.3)
Chloride: 107 mmol/L (ref 98–111)
Creatinine, Ser: 0.79 mg/dL (ref 0.61–1.24)
GFR, Estimated: >60 mL/min (ref >60)
Glucose, Bld: 88 mg/dL (ref 70–99)
Potassium: 4.4 mmol/L (ref 3.5–5.1)
Sodium: 138 mmol/L (ref 135–145)
Total Bilirubin: 0.7 mg/dL (ref 0.3–1.2)
Total Protein: 7.6 g/dL (ref 6.5–8.1)

## 2021-01-12 LAB — PHOSPHORUS: Phosphorus: 5.2 mg/dL — ABNORMAL HIGH (ref 2.5–4.6)

## 2021-01-12 MED ORDER — MAGNESIUM SULFATE IN D5W 1-5 GM/100ML-% IV SOLN
1.0000 g | Freq: Once | INTRAVENOUS | Status: AC
  Administered 2021-01-12: 1 g via INTRAVENOUS
  Filled 2021-01-12: qty 100

## 2021-01-12 NOTE — Progress Notes (Signed)
Physical Therapy Treatment Patient Details Name: Alvin Clark MRN: [id number] DOB: May 10, 1961 Today's Date: 01/12/2021    History of Present Illness Pt admitted on 12/27/20 with Acute metabolic encephalopathy, Chronic evolving memory issues, Chronic alcoholism. He had a fall out of bed on 3/11.    PT Comments    [place] is pleasant. Less impulsive. Ambulated x 400' with Rw,ambulated x 8' in room without RW, Gait is improved,. Less noted balance loss.    Follow Up Recommendations  SNF     Equipment Recommendations  None recommended by PT    Recommendations for Other Services       Precautions / Restrictions Precautions Precautions: Fall Precaution Comments: incontinent    Mobility  Bed Mobility   Bed Mobility: Supine to Sit     Supine to sit: Supervision          Transfers Overall transfer level: Needs assistance Equipment used: Rolling walker (2 wheeled)   Sit to Stand: Min guard            Ambulation/Gait Ambulation/Gait assistance: Min assist Gait Distance (Feet): 400 Feet Assistive device: Rolling walker (2 wheeled)       General Gait Details: improved posture in RW, staying closer. 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   Stairs             Wheelchair Mobility    Modified Rankin (Stroke Patients Only)       Balance Overall balance assessment: Needs assistance;History of Falls Sitting-balance support: Feet supported Sitting balance-Leahy Scale: Good     Standing balance support: Bilateral upper extremity supported;No upper extremity supported Standing balance-Leahy Scale: Fair                              Cognition Arousal/Alertness: Awake/alert Behavior During Therapy: WFL for tasks assessed/performed   Area of Impairment: Orientation;Safety/judgement;Problem solving                               General Comments: pt. less impulsive       Exercises      General Comments        Pertinent Vitals/Pain Pain Assessment: No/denies pain    Home Living                      Prior Function            PT Goals (current goals can now be found in the care plan section) Acute Rehab PT Goals Patient Stated Goal: ready to walk, wants to get back to work, drive a delivery trbck PT Goal Formulation: With patient Time For Goal Achievement: 01/26/21 Potential to Achieve Goals: Good Progress towards PT goals: Progressing toward goals    Frequency    Min 2X/week      PT Plan Current plan remains appropriate;Frequency needs to be updated    Co-evaluation              AM-PAC PT "6 Clicks" Mobility   Outcome Measure  Help needed turning from your back to your side while in a flat bed without using bedrails?: None Help needed moving from lying on your back to sitting on the side of a flat bed without using bedrails?: None Help needed moving to and from a bed to a chair (including a wheelchair)?:  A Little Help needed standing up from a chair using your arms (e.g., wheelchair or bedside chair)?: A Little Help needed to walk in hospital room?: A Little Help needed climbing 3-5 steps with a railing? : A Little 6 Click Score: 20    End of Session Equipment Utilized During Treatment: Gait belt Activity Tolerance: Patient tolerated treatment well Patient left: in chair;with call bell/phone within reach;with chair alarm set Nurse Communication: Mobility status PT Visit Diagnosis: Unsteadiness on feet (R26.81);Other abnormalities of gait and mobility (R26.89);History of falling (Z91.81)     Time: 5681-2751 PT Time Calculation (min) (ACUTE ONLY): 17 min  Charges:  $Gait Training: 8-22 mins                     Blanchard Kelch PT Acute Rehabilitation Services Pager [phone] Office [phone]    Rada Hay 01/12/2021, 2:40 PM

## 2021-01-12 NOTE — TOC Progression Note (Addendum)
Transition of Care [facility]) - Progression Note    Patient Details  Name: Alvin Clark MRN: [id number] Date of Birth: October 25, 1960  Transition of Care [facility]) CM/SW Contact  Darleene Cleaver, [place] Phone Number: 01/12/2021, 9:31 AM  Clinical Narrative:     CSW was informed by SNF that they need to verify his Medicaid application now and will let me know if they can still accept him.  CSW updated attending physician and bedside nurse.  1:00pm  CSW was informed by [facility], that they can not accept patient now.  CSW attempted to contact patient's sister, left a voice mail, waiting for call back.  CSW to contact other SNFs to see if they can accept patient under an LOG.  Patient's Medicaid is still pending as is his disability.  CSW to continue to work on SNF placement.  CSW attempted to contact [facility], to see if they can take patient, left message on voice mail.    Expected Discharge Plan: Skilled Nursing Facility Barriers to Discharge: Financial Resources,Homeless with medical needs,Continued Medical Work up,No SNF bed,Requiring sitter/restraints,SNF Pending payor source - LOG,SNF Pending Medicaid,Family Issues  Expected Discharge Plan and Services Expected Discharge Plan: Skilled Nursing Facility In-house Referral: Clinical Social Work,Financial Counselor     Living arrangements for the past 2 months: No permanent address                                       Social Determinants of Health (SDOH) Interventions    Readmission Risk Interventions No flowsheet data found.

## 2021-01-12 NOTE — Discharge Summary (Incomplete)
Physician Discharge Summary  Alvin Clark BWG:[id number] DOB: 1961/03/11 DOA: 12/27/2020  PCP: Patient, No Pcp Per  Admit date: 12/27/2020 Discharge date: 01/12/2021  Admitted From: Home Disposition: SNF  Recommendations for Outpatient Follow-up:  1. Follow up with PCP in 1-2 weeks 2. Please obtain BMP/CBC in one week 3. Please follow up on the following pending results:  Home Health: No  Equipment/Devices: None    Discharge Condition: Stable  CODE STATUS: FULL CODE  Diet recommendation: Heart Healthy Carb Modified Diet  Brief/Interim Summary: The patient is a 60 year old African-American male with chronic alcoholism who was trying to enter the home that he was evicted from on 12/27/2020.  Homeowners called [employer] and found to be [name].  EMS was called who then called the patient's family and they stated that the patient had concerns for chronic alcoholism and developing memory issues for the last 1 to 2 years.  He was brought to the ED for further evaluation and in the ED he was found to be afebrile, slightly tachycardic blood pressures stable.  Labs showed an AKI and abnormal LFTs as well as slightly elevated white blood cell count of 12.4.  Blood alcohol level content was negative and UDS was negative.  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  Chest x-ray was unremarkable and he was admitted to the hospitalist service for his confusion and has been worked up.  He continues to have intermittent encephalopathy and he is monitor for alcohol withdrawal continues to be on folic acid, multivitamin as well as thiamine.  PT OT evaluated and recommending SNF however he is a difficult placement given his lack of insurance as well as homeless issues.  Not able to  stand up by himself when he tries to and has fallen a few times. Still awating SNF placement for Safe Discharge Disposition and he has been accepted finally per TOC at [facility] and stable to D/C.   Discharge Diagnoses:  Principal Problem:   AKI (acute kidney injury) (HCC) Active Problems:   Confusion   Leukocytosis   Blurry vision, bilateral   Transaminitis   Constipation   Withdrawal symptoms, alcohol (HCC)   Protein-calorie malnutrition, severe  Acute metabolic Encephalopathy Chronic involving memory issues -Multifactorial EtOH abuse, malnutrition.  Patient has chronic alcoholism and family reports worsening memory issues for the last 1 to 2 years -He is brought in for disorientation and abnormal behavior and dehydration may have precipitated an acute delirium and disorientation -His mentation and mental status continues to be waxing and waning and has intermittent episodes of delirium and agitation; Appeared pleasant today  -Patient tends to remain calm in the morning and starts to get agitated in the afternoon -Treat underlying causes -Unsure of baseline. -Per EMR family reported memory issues for last 1 to 2 years. -CT scan head negative but did show generalized volume loss which was advanced for his age -31/10 UDS negative -Per staff intermittent episodes of agitation, delirium and has [name] multiple times  -Continues to have Intermittent Encephalopathy slightly worse today, however patient compliant and pleasant. -Sitter Stopped given anticipated D/C to SNF -C/w Quetiapine 25 mg po BID and with Haloperidol Lactate injection 2 mg q6hptn Agitation but will hold if QTC >470  Chronic EtOH Abuse -Currently not in Withdrawals -C/w folic acid 1  mg p.o. daily, multivitamin with minerals 1 tab p.o. daily, and thiamine 100 mg p.o./IV daily  Hypokalemia -C/w a Potassium goal> 4 -K+ was 4.2 on last chek  -Continue to Monitor and Replete as Necessary -Repeat CMP  intermittently  Hypomagnesmia -C/w a Magnesium goal> 2 -Mag Level was 1.9 and was replete with IV Mag Sulfate 1 gram yesterday  -Continue to Monitor and Replete as Necessary -Repeat Mag Level intermittently   Hypophosphatemia -C/w a Phosphorus goal> 2.5 -Phos Level was 4.2 on last check  -Continue to Monitor and Replete as Necessary -Repeat Phos Level intermittently   Impaired Mobility -PT continues recommends SNF -Ambulate patient every shift as able and has a Recruitment consultantafety sitter given Multiple FALLS  Severe Protein Calorie Malnutrition -Severe Malnutritionrelated to chronic illness (chronic alcoholism)as evidenced by severe fat depletion,severe muscle depletion. 0Encourage patient to to eat -Ensure TID and MVI with minerals daily  Constipation -Continue Bowel Regimen with Miralax 17 g po Daily -If Necessary will place on Senna-Docusate 1 tab po BID and consider PRN Bisacodyl Suppositories   AKI -Resolved. Patient's BUN/Cr is now 14/0.88 and at baseline -Continue to Monitor and Trend Intermittently  -Was getting IVF with NS at 75 mL/hr but stopped   Elevated AST/Abnormal LFTs -Most Likely 2/2 to EtOH Abuse -Patient's AST has gone from 65 -> 49 -> 54 on last check  -ALT has gone from 59 -> 39 and normalized -Continue to Monitor and Trend Intermittently and repeat at least weekly  Macrocytic Anemia, stable -Patient's Hgb/Hct has been relatively stable but slowly dropped since admission and is now 10.1/32.2 on last check and today is stable at 10.8/34.0 -Anemia Panel recently done 3/19 showed an iron level of 62, U IBC of 262, TIBC of 324, saturation ratios of 19%, ferritin level 274, folate level of 14.0 and vitamin B12 of 288  -FOBT is pending  -Continue to Monitor for S/Sx of Bleeding; Currently no overt bleeding noted -Repeat CBC in the AM    Discharge Instructions   Allergies as of 01/12/2021   No Known Allergies   Med Rec must be completed prior to using  this SMARTLINK***       No Known Allergies  Consultations:  None  Procedures/Studies: CT Head Wo Contrast  Result Date: 12/28/2020 CLINICAL DATA:  60 year old male status post fall out of bed. Facial trauma. EXAM: CT HEAD WITHOUT CONTRAST TECHNIQUE: Contiguous axial images were obtained from the base of the skull through the vertex without intravenous contrast. COMPARISON:  Head CT yesterday. FINDINGS: Brain: Generalized volume loss appears advanced for age as seen yesterday. No midline shift, ventriculomegaly, mass effect, evidence of mass lesion, intracranial hemorrhage or evidence of cortically based acute infarction. Stable gray-white matter differentiation throughout the brain. Patchy bilateral white matter hypodensity. No cortical encephalomalacia identified. Vascular: Calcified atherosclerosis at the skull base. No suspicious intracranial vascular hyperdensity. Skull: Stable.  No acute osseous abnormality identified. Sinuses/Orbits: Unchanged mild bubbly opacity in the right sphenoid sinus. Other Visualized paranasal sinuses and mastoids are stable and well pneumatized. Tympanic cavities remain clear. Other: Visualized orbits and scalp soft tissues are within normal limits. IMPRESSION: 1. No acute intracranial abnormality or acute traumatic injury identified. 2. Age advanced generalized cerebral volume loss, white matter changes. Electronically Signed   By: Odessa FlemingH  Hall M.D.   On: 12/28/2020 06:20   CT Head Wo Contrast  Result Date: 12/27/2020 CLINICAL DATA:  Altered mental status and dizziness EXAM: CT HEAD WITHOUT CONTRAST TECHNIQUE: Contiguous axial images were obtained from the  base of the skull through the vertex without intravenous contrast. COMPARISON:  None. FINDINGS: Brain: No evidence of acute infarction, hemorrhage, hydrocephalus, extra-axial collection or mass lesion/mass effect. Mild atrophic and chronic white matter ischemic changes are noted. Vascular: No hyperdense vessel or  unexpected calcification. Skull: Normal. Negative for fracture or focal lesion. Sinuses/Orbits: No acute finding. Other: None. IMPRESSION: Chronic atrophic and ischemic changes without acute abnormality. Electronically Signed   By: Alcide Clever M.D.   On: 12/27/2020 17:39   CT Cervical Spine Wo Contrast  Result Date: 12/28/2020 CLINICAL DATA:  60 year old male status post fall out of bed. Facial trauma. EXAM: CT CERVICAL SPINE WITHOUT CONTRAST TECHNIQUE: Multidetector CT imaging of the cervical spine was performed without intravenous contrast. Multiplanar CT image reconstructions were also generated. COMPARISON:  Head CTs reported separately. FINDINGS: Repeated imaging attempts due to motion, subsequently the exam is diagnostic. Alignment: Straightening of cervical lordosis. Mild degenerative retrolisthesis of C5 on C6. Cervicothoracic junction alignment is within normal limits. Bilateral posterior element alignment is within normal limits. Skull base and vertebrae: Visualized skull base is intact. No atlanto-occipital dissociation. C1 and C2 are aligned and intact. No acute osseous abnormality identified. Soft tissues and spinal canal: No prevertebral fluid or swelling. No visible canal hematoma. Bulky calcified carotid atherosclerosis in the neck greater on the right. Otherwise negative visible noncontrast neck soft tissues. Disc levels: Partially calcified degenerative ligamentous hypertrophy about the odontoid. Widespread cervical disc degeneration, with advanced disc and endplate degeneration C5-C6 and C6-C7. Multilevel mild spinal stenosis suspected. Moderate to severe facet arthropathy on the left at C2-C3, on the right at C3-C4 and C4-C5. Upper chest: Visible upper thoracic levels appear intact. Negative lung apices aside from mild paraseptal emphysema. Negative noncontrast thoracic inlet soft tissues. Other: None. IMPRESSION: 1. No acute traumatic injury identified in the cervical spine. 2. Advanced  cervical spine degeneration with suspected multilevel mild spinal stenosis. Electronically Signed   By: Odessa Fleming M.D.   On: 12/28/2020 06:25   DG Chest Portable 1 View  Result Date: 12/27/2020 CLINICAL DATA:  Shortness of breath. EXAM: PORTABLE CHEST 1 VIEW COMPARISON:  None. FINDINGS: The heart size and mediastinal contours are within normal limits. Both lungs are clear. Chronic fifth, sixth and seventh right rib fractures are seen. IMPRESSION: No active disease. Electronically Signed   By: Aram Candela M.D.   On: 12/27/2020 17:52      Subjective:   Discharge Exam: Vitals:   01/11/21 2038 01/12/21 0500  BP: 127/76 (!) 96/56  Pulse: 98 93  Resp: 18 18  Temp: 98.3 F (36.8 C) 98 F (36.7 C)  SpO2: 99% 99%   Vitals:   01/11/21 0550 01/11/21 1500 01/11/21 2038 01/12/21 0500  BP: 120/87 106/75 127/76 (!) 96/56  Pulse: (!) 105 100 98 93  Resp:  Temp: 98.1 F (36.7 C) 97.8 F (36.6 C) 98.3 F (36.8 C) 98 F (36.7 C)  TempSrc: Oral Oral    SpO2: 100% 100% 99% 99%  Weight:      Height:       General: Pt is alert, awake, not in acute distress Cardiovascular: RRR, S1/S2 +, no rubs, no gallops Respiratory: CTA bilaterally, no wheezing, no rhonchi Abdominal: Soft, NT, ND, bowel sounds + Extremities: no edema, no cyanosis  The results of significant diagnostics from this hospitalization (including imaging, microbiology, ancillary and laboratory) are listed below for reference.    Microbiology: Recent Results (from the past 240 hour(s))  SARS CORONAVIRUS 2 (  TAT 6-24 HRS) Nasopharyngeal Nasopharyngeal Swab     Status: None   Collection Time: 01/11/21  1:26 PM   Specimen: Nasopharyngeal Swab  Result Value Ref Range Status   SARS Coronavirus 2 NEGATIVE NEGATIVE Final    Comment: (NOTE) SARS-CoV-2 target nucleic acids are NOT DETECTED.  The SARS-CoV-2 RNA is generally detectable in upper and lower respiratory specimens during the acute phase of infection.  Negative results do not preclude SARS-CoV-2 infection, do not rule out co-infections with other pathogens, and should not be used as the sole basis for treatment or other patient management decisions. Negative results must be combined with clinical observations, patient history, and epidemiological information. The expected result is Negative.  Fact Sheet for Patients: HairSlick.no  Fact Sheet for Healthcare Providers: quierodirigir.com  This test is not yet approved or cleared by the [place] FDA and  has been authorized for detection and/or diagnosis of SARS-CoV-2 by FDA under an Emergency Use Authorization (EUA). This EUA will remain  in effect (meaning this test can be used) for the duration of the COVID-19 declaration under Se ction 564(b)(1) of the Act, 21 U.S.C. section 360bbb-3(b)(1), unless the authorization is terminated or revoked sooner.  Performed at [facility], [address], [place], [place] [address]    Labs: BNP (last 3 results) No results for input(s): BNP in the last 8760 hours. Basic Metabolic Panel: Recent Labs  Lab 01/08/21 0321 01/10/21 0349  NA 137 141  K 4.2 4.2  CL 105 109  CO2 24 24  GLUCOSE 122* 101*  BUN 17 14  CREATININE 0.87 0.88  CALCIUM 9.2 9.8  MG 1.7 1.9  PHOS 3.2 4.2   Liver Function Tests: Recent Labs  Lab 01/08/21 0321 01/10/21 0349  AST 49* 54*  ALT 36 39  ALKPHOS 92 92  BILITOT 0.7 0.4  PROT 6.7 7.1  ALBUMIN 3.0* 3.0*   No results for input(s): LIPASE, AMYLASE in the last 168 hours. No results for input(s): AMMONIA in the last 168 hours. CBC: Recent Labs  Lab 01/08/21 0321 01/10/21 0349 01/12/21 0425  WBC 8.8 7.0 9.2  HGB 9.6* 10.1* 10.8*  HCT 30.6* 32.2* 34.0*  MCV 102.7* 101.9* 103.0*  PLT 187 201 222   Cardiac Enzymes: No results for input(s): CKTOTAL, CKMB, CKMBINDEX, TROPONINI in the last 168 hours. BNP: Invalid input(s):  POCBNP CBG: No results for input(s): GLUCAP in the last 168 hours. D-Dimer No results for input(s): DDIMER in the last 72 hours. Hgb A1c No results for input(s): HGBA1C in the last 72 hours. Lipid Profile No results for input(s): CHOL, HDL, LDLCALC, TRIG, CHOLHDL, LDLDIRECT in the last 72 hours. Thyroid function studies No results for input(s): TSH, T4TOTAL, T3FREE, THYROIDAB in the last 72 hours.  Invalid input(s): FREET3 Anemia work up No results for input(s): VITAMINB12, FOLATE, FERRITIN, TIBC, IRON, RETICCTPCT in the last 72 hours. Urinalysis    Component Value Date/Time   COLORURINE YELLOW 12/28/2020 0000   APPEARANCEUR HAZY (A) 12/28/2020 0000   LABSPEC 1.025 12/28/2020 0000   PHURINE 5.0 12/28/2020 0000   GLUCOSEU NEGATIVE 12/28/2020 0000   HGBUR NEGATIVE 12/28/2020 0000   BILIRUBINUR NEGATIVE 12/28/2020 0000   KETONESUR NEGATIVE 12/28/2020 0000   PROTEINUR NEGATIVE 12/28/2020 0000   UROBILINOGEN 1.0 05/31/2014 1841   NITRITE NEGATIVE 12/28/2020 0000   LEUKOCYTESUR NEGATIVE 12/28/2020 0000   Sepsis Labs Invalid input(s): PROCALCITONIN,  WBC,  LACTICIDVEN Microbiology Recent Results (from the past 240 hour(s))  SARS CORONAVIRUS 2 (TAT 6-24  HRS) Nasopharyngeal Nasopharyngeal Swab     Status: None   Collection Time: 01/11/21  1:26 PM   Specimen: Nasopharyngeal Swab  Result Value Ref Range Status   SARS Coronavirus 2 NEGATIVE NEGATIVE Final    Comment: (NOTE) SARS-CoV-2 target nucleic acids are NOT DETECTED.  The SARS-CoV-2 RNA is generally detectable in upper and lower respiratory specimens during the acute phase of infection. Negative results do not preclude SARS-CoV-2 infection, do not rule out co-infections with other pathogens, and should not be used as the sole basis for treatment or other patient management decisions. Negative results must be combined with clinical observations, patient history, and epidemiological information. The expected result is  Negative.  Fact Sheet for Patients: HairSlick.no  Fact Sheet for Healthcare Providers: quierodirigir.com  This test is not yet approved or cleared by the [place] FDA and  has been authorized for detection and/or diagnosis of SARS-CoV-2 by FDA under an Emergency Use Authorization (EUA). This EUA will remain  in effect (meaning this test can be used) for the duration of the COVID-19 declaration under Se ction 564(b)(1) of the Act, 21 U.S.C. section 360bbb-3(b)(1), unless the authorization is terminated or revoked sooner.  Performed at [facility], [address] [place], [place] [address]    Time coordinating discharge: 35 minutes  SIGNED:  Merlene Laughter, DO [facility] 01/12/2021, 8:34 AM Pager is on AMION  If 7PM-7AM, please contact night-coverage [email or web address]

## 2021-01-12 NOTE — Progress Notes (Signed)
PROGRESS NOTE    Alvin Clark  ASU:[id number] DOB: 03/02/1961 DOA: 12/27/2020 PCP: Patient, No Pcp Per  Brief Narrative: The patient is a 60 year old African-American male with chronic alcoholism who was trying to enter the home that he was evicted from on 12/27/2020.  Homeowners called [employer] and found to be [name].  EMS was called who then called the patient's family and they stated that the patient had concerns for chronic alcoholism and developing memory issues for the last 1 to 2 years.  He was brought to the ED for further evaluation and in the ED he was found to be afebrile, slightly tachycardic blood pressures stable.  Labs showed an AKI and abnormal LFTs as well as slightly elevated white blood cell count of 12.4.  Blood alcohol level content was negative and UDS was negative.  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  Chest x-ray was unremarkable and he was admitted to the hospitalist service for his confusion and has been worked up.  He continues to have intermittent encephalopathy and he is monitor for alcohol withdrawal continues to be on folic acid, multivitamin as well as thiamine.  PT OT evaluated and recommending SNF however he is a difficult placement given his lack of insurance as well as homeless issues.  Not able to stand up by himself when he tries to and has fallen a few times. Still awating SNF placement for Safe Discharge Disposition and was to be today but informed by [facility] that had stated that they cannot take the patient yet at the need to verify his Medicaid application.  Assessment & Plan:   Principal Problem:   AKI (acute kidney injury) (HCC) Active Problems:   Confusion   Leukocytosis   Blurry vision, bilateral   Transaminitis    Constipation   Withdrawal symptoms, alcohol (HCC)   Protein-calorie malnutrition, severe   Acute metabolic Encephalopathy Chronic involving memory issues -Multifactorial EtOH abuse, malnutrition. Patient has chronic alcoholism and family reports worsening memory issues for the last 1 to 2 years -He is brought in for disorientation and abnormal behavior and dehydration may have precipitated an acute delirium and disorientation -His mentation and mental status continues to be waxing and waning and has intermittent episodes of delirium and agitation; Appeared pleasant today -Patient tends to remain calm in the morning and starts to get agitated in the afternoon -Treat underlying causes -Unsure of baseline. -Per EMR family reported memory issues for last 1 to 2 years. -CT scan head negative but did show generalized volume loss which was advanced for his age -59/10 UDS negative -Per staff intermittent episodes of agitation, delirium and has [name] multiple times  -Continues to have Intermittent Encephalopathy slightly worse today, however patient compliant and pleasant. -Sitter Stopped given anticipated D/C to SNF but renewed today given given that he will likely not be discharged as the SNF needs to verify his Medicaid application -C/w Quetiapine 25 mg po BID and with Haloperidol Lactate injection 2 mg q6hptn Agitation but will hold if QTC >470  Chronic EtOH Abuse -Currently not in Withdrawals -C/w folic acid 1 mg p.o. daily, multivitamin with minerals 1 tab p.o. daily, and thiamine 100 mg p.o./IV daily  Hypokalemia -C/w a Potassium goal> 4 -K+ was 4.4 on last check -Continue to Monitor and Replete  as Necessary -Repeat CMP intermittently  Hypomagnesmia -C/w a Magnesium goal> 2 -Mag Level was 1.9 and will replete with IV Mag Sulfate 1 gram -Continue to Monitor and Replete as Necessary -Repeat Mag Level intermittently   Hypophosphatemia -C/w a Phosphorus goal> 2.5 -Phos  Levelwas 5.2 on last check -Continue to Monitor and Replete as Necessary -Repeat Phos Level intermittently   Impaired Mobility -PT continues recommends SNF -Ambulate patient every shift as able and has a Recruitment consultant (renewed) given Multiple FALLS  Severe Protein Calorie Malnutrition -Severe Malnutritionrelated to chronic illness (chronic alcoholism)as evidenced by severe fat depletion,severe muscle depletion. 0Encourage patient to to eat -Ensure TIDand MVIwith minerals daily  Constipation -Continue Bowel Regimen with Miralax 17 g po Daily -If Necessary will place on Senna-Docusate 1 tab po BID and consider PRN Bisacodyl Suppositories   AKI Metabolic Acidosis, mild -Resolved. Patient's BUN/Cr is now 17/0.88 and at baseline -Had mild Acidosis with a CO2 of 20, AG of 11, Chloride level of 107 -Was getting IVF with NS at 75 mL/hr butstopped -Continue to Monitor and Trend Intermittently   Elevated AST/Abnormal LFTs -Most Likely 2/2 to EtOH Abuse -Patient's AST has gone from 65 -> 49 -> 54on last checkand slightly and is 74 today -ALT has gone from 59 -> 39 and normalized but is up again to 54 -Continue to Monitor and Trend Intermittently and repeat at least weekly -If necessary will need a RUQ U/S and Acute Hepatitis Panel   Macrocytic Anemia, stable -Patient's Hgb/Hct has been relatively stable but slowly dropped since admission and is now 10.1/32.2 on last check and today is stable at 10.8/34.0 -Anemia Panel recently done 3/19 showed an iron level of 62, U IBC of 262, TIBC of 324, saturation ratios of 19%, ferritin level 274, folate level of 14.0 and vitamin B12 of 288  -FOBT is pending  -Continue to Monitor for S/Sx of Bleeding; Currently no overt bleeding noted -Repeat CBC in the AM   DVT prophylaxis: Enoxaparin 40 mg sq q24h Code Status: FULL CODE Family Communication: No family present at bedside  Disposition Plan: Unsafe D/C Home as he is difficult to  place; Needs SNF and He is Homeless with Medical needs   Status is: Inpatient  Remains inpatient appropriate because:Unsafe d/c plan, IV treatments appropriate due to intensity of illness or inability to take PO and Inpatient level of care appropriate due to severity of illness   Dispo: The patient is from: Home              Anticipated d/c is to: SNF              Patient currently is medically stable to d/c.   Difficult to place patient Yes  Consultants:   None  Procedures: None  Antimicrobials:  Anti-infectives (From admission, onward)   None        Subjective: Seen and examined at bedside and he was resting in the bed and sleeping and awoken from sleep.  No new complaints of pain.  No nausea or vomiting.  No chest pain or shortness of breath.  No other concerns or complaints at this time and he has been moved closer to the nursing station for frequent checks.  Objective: Vitals:   01/11/21 0550 01/11/21 1500 01/11/21 2038 01/12/21 0500  BP: 120/87 106/75 127/76 (!) 96/56  Pulse: (!) 105 100 98 93  Resp:  20 18 18   Temp: 98.1 F (36.7 C) 97.8 F (36.6 C) 98.3 F (36.8 C) 98 F (  36.7 C)  TempSrc: Oral Oral    SpO2: 100% 100% 99% 99%  Weight:      Height:       No intake or output data in the 24 hours ending 01/12/21 1118 Filed Weights   01/07/21 0607 01/08/21 0500 01/11/21 0500  Weight: 60.6 kg 64.6 kg 64.6 kg   Examination: Physical Exam:  Constitutional: Patient is a thin disheveled African-American male currently no acute distress appears calm and resting and awoken from sleep Eyes: Lids and conjunctivae normal, sclerae anicteric  ENMT: External Ears, Nose appear normal. Grossly normal hearing.  Neck: Appears normal, supple, no cervical masses, normal ROM, no appreciable thyromegaly; no JVD Respiratory: Diminished to auscultation bilaterally, no wheezing, rales, rhonchi or crackles. Normal respiratory effort and patient is not tachypenic. No accessory  muscle use.  Cardiovascular: RRR, no murmurs / rubs / gallops. S1 and S2 auscultated. No extremity edema.  Abdomen: Soft, non-tender, non-distended. Bowel sounds positive.  GU: Deferred. Musculoskeletal: No clubbing / cyanosis of digits/nails. No joint deformity upper and lower extremities.  Skin: No rashes, lesions, ulcers on limited skin evaluation. No induration; Warm and dry.  Neurologic: CN 2-12 grossly intact with no focal deficits. Romberg sign and cerebellar reflexes not assessed.  Psychiatric: Slightly impaired judgment and insight.  He was a little somnolent and drowsy but easily awoken and awake alert then. Normal mood and appropriate affect.    Data Reviewed: I have personally reviewed following labs and imaging studies  CBC: Recent Labs  Lab 01/08/21 0321 01/10/21 0349 01/12/21 0425  WBC 8.8 7.0 9.2  HGB 9.6* 10.1* 10.8*  HCT 30.6* 32.2* 34.0*  MCV 102.7* 101.9* 103.0*  PLT 187 201 222   Basic Metabolic Panel: Recent Labs  Lab 01/08/21 0321 01/10/21 0349 01/12/21 0425  NA 137 141 138  K 4.2 4.2 4.4  CL 105 109 107  CO2 24 24 20*  GLUCOSE 122* 101* 88  BUN 17 14 17   CREATININE 0.87 0.88 0.79  CALCIUM 9.2 9.8 9.8  MG 1.7 1.9 1.9  PHOS 3.2 4.2 5.2*   GFR: Estimated Creatinine Clearance: 92 mL/min (by C-G formula based on SCr of 0.79 mg/dL). Liver Function Tests: Recent Labs  Lab 01/08/21 0321 01/10/21 0349 01/12/21 0425  AST 49* 54* 74*  ALT 36 39 54*  ALKPHOS 92 92 93  BILITOT 0.7 0.4 0.7  PROT 6.7 7.1 7.6  ALBUMIN 3.0* 3.0* 3.6   No results for input(s): LIPASE, AMYLASE in the last 168 hours. No results for input(s): AMMONIA in the last 168 hours. Coagulation Profile: No results for input(s): INR, PROTIME in the last 168 hours. Cardiac Enzymes: No results for input(s): CKTOTAL, CKMB, CKMBINDEX, TROPONINI in the last 168 hours. BNP (last 3 results) No results for input(s): PROBNP in the last 8760 hours. HbA1C: No results for input(s): HGBA1C  in the last 72 hours. CBG: No results for input(s): GLUCAP in the last 168 hours. Lipid Profile: No results for input(s): CHOL, HDL, LDLCALC, TRIG, CHOLHDL, LDLDIRECT in the last 72 hours. Thyroid Function Tests: No results for input(s): TSH, T4TOTAL, FREET4, T3FREE, THYROIDAB in the last 72 hours. Anemia Panel: No results for input(s): VITAMINB12, FOLATE, FERRITIN, TIBC, IRON, RETICCTPCT in the last 72 hours. Sepsis Labs: No results for input(s): PROCALCITON, LATICACIDVEN in the last 168 hours.  Recent Results (from the past 240 hour(s))  SARS CORONAVIRUS 2 (TAT 6-24 HRS) Nasopharyngeal Nasopharyngeal Swab     Status: None   Collection Time: 01/11/21  1:26 PM  Specimen: Nasopharyngeal Swab  Result Value Ref Range Status   SARS Coronavirus 2 NEGATIVE NEGATIVE Final    Comment: (NOTE) SARS-CoV-2 target nucleic acids are NOT DETECTED.  The SARS-CoV-2 RNA is generally detectable in upper and lower respiratory specimens during the acute phase of infection. Negative results do not preclude SARS-CoV-2 infection, do not rule out co-infections with other pathogens, and should not be used as the sole basis for treatment or other patient management decisions. Negative results must be combined with clinical observations, patient history, and epidemiological information. The expected result is Negative.  Fact Sheet for Patients: HairSlick.nohttps://www.fda.gov/media/138098/download  Fact Sheet for Healthcare Providers: quierodirigir.comhttps://www.fda.gov/media/138095/download  This test is not yet approved or cleared by the [place] FDA and  has been authorized for detection and/or diagnosis of SARS-CoV-2 by FDA under an Emergency Use Authorization (EUA). This EUA will remain  in effect (meaning this test can be used) for the duration of the COVID-19 declaration under Se ction 564(b)(1) of the Act, 21 U.S.C. section 360bbb-3(b)(1), unless the authorization is terminated or revoked sooner.  Performed at  [facility], [address] [place], [place] [address]      RN Pressure Injury Documentation:     Estimated body mass index is 21.03 kg/m as calculated from the following:   Height as of this encounter: 5\' 9"  (1.753 m).   Weight as of this encounter: 64.6 kg.  Malnutrition Type:  Nutrition Problem: Severe Malnutrition Etiology: chronic illness (chronic alcoholism)  Malnutrition Characteristics:  Signs/Symptoms: severe fat depletion,severe muscle depletion  Nutrition Interventions:  Interventions: Ensure Enlive (each supplement provides 350kcal and 20 grams of protein),MVI  Radiology Studies: No results found.  Scheduled Meds: . enoxaparin (LOVENOX) injection  40 mg Subcutaneous Q24H  . feeding supplement  237 mL Oral TID BM  . folic acid  1 mg Oral Daily  . multivitamin with minerals  1 tablet Oral Daily  . polyethylene glycol  17 g Oral Daily  . QUEtiapine  25 mg Oral BID  . sodium chloride flush  3 mL Intravenous Q12H  . thiamine  100 mg Oral Daily   Or  . thiamine  100 mg Intravenous Daily   Continuous Infusions: . magnesium sulfate bolus IVPB 50 mL/hr at 01/10/21 1419    LOS: 15 days   Merlene Laughtermair Latif Myishia Kasik, DO [facility] PAGER is on AMION  If 7PM-7AM, please contact night-coverage [email or web address]

## 2021-01-13 NOTE — Progress Notes (Signed)
PROGRESS NOTE    Alvin Clark  HQI:[id number] DOB: 1961/06/22 DOA: 12/27/2020 PCP: Patient, No Pcp Per  Brief Narrative: The patient is a 60 year old African-American male with chronic alcoholism who was trying to enter the home that he was evicted from on 12/27/2020.  Homeowners called [employer] and found to be [name].  EMS was called who then called the patient's family and they stated that the patient had concerns for chronic alcoholism and developing memory issues for the last 1 to 2 years.  He was brought to the ED for further evaluation and in the ED he was found to be afebrile, slightly tachycardic blood pressures stable.  Labs showed an AKI and abnormal LFTs as well as slightly elevated white blood cell count of 12.4.  Blood alcohol level content was negative and UDS was negative.  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  Chest x-ray was unremarkable and he was admitted to the hospitalist service for his confusion and has been worked up.  He continues to have intermittent encephalopathy and he is monitor for alcohol withdrawal continues to be on folic acid, multivitamin as well as thiamine.  PT OT evaluated and recommending SNF however he is a difficult placement given his lack of insurance as well as homeless issues.  Not able to stand up by himself when he tries to and has fallen a few times. Still awating SNF placement for Safe Discharge Disposition and was to be today but informed by [facility] that had stated that they cannot take the patient yet at the need to verify his Medicaid application and subsequently declined and cannot accept the patient now. Will need to look for a new SNF.  Assessment & Plan:   Principal Problem:   AKI (acute kidney injury)  (HCC) Active Problems:   Confusion   Leukocytosis   Blurry vision, bilateral   Transaminitis   Constipation   Withdrawal symptoms, alcohol (HCC)   Protein-calorie malnutrition, severe   Acute Metabolic Encephalopathy Chronic involving memory issues -Multifactorial EtOH abuse, malnutrition. Patient has chronic alcoholism and family reports worsening memory issues for the last 1 to 2 years -He is brought in for disorientation and abnormal behavior and dehydration may have precipitated an acute delirium and disorientation -His mentation and mental status continues to be waxing and waning and has intermittent episodes of delirium and agitation; Appeared pleasant today -Patient tends to remain calm in the morning and starts to get agitated in the afternoon -Treat underlying causes -Unsure of baseline. -Per EMR family reported memory issues for last 1 to 2 years. -CT scan head negative but did show generalized volume loss which was advanced for his age -73/10 UDS negative -Per staff intermittent episodes of agitation, delirium and has [name] multiple times  -Continues to have Intermittent Encephalopathy slightly worse today, however patient compliant and pleasant. -Sitter Stopped given anticipated D/C to SNF but renewed today given given that he will likely not be discharged as the SNF needs to verify his Medicaid application -C/w Quetiapine 25 mg po BID and with Haloperidol Lactate injection 2 mg q6hptn Agitation but will hold if QTC >470  Chronic EtOH Abuse -Currently not in Withdrawals -C/w folic acid 1 mg p.o. daily, multivitamin with minerals 1 tab p.o. daily, and thiamine 100 mg p.o./IV daily  Hypokalemia -C/w a Potassium goal> 4 -K+ was 4.4 on last check -Continue to Monitor and Replete as Necessary -Repeat CMP intermittently  Hypomagnesmia -C/w a Magnesium goal> 2 -Mag Level was 1.9 and will replete with IV Mag Sulfate 1 gram -Continue to Monitor and Replete as  Necessary -Repeat Mag Level intermittently   Hypophosphatemia -C/w a Phosphorus goal> 2.5 -Phos Levelwas 5.2 on last check -Continue to Monitor and Replete as Necessary -Repeat Phos Level intermittently   Impaired Mobility -PT continues recommends SNF -Ambulate patient every shift as able and has a Recruitment consultant (renewed) given Multiple FALLS  Severe Protein Calorie Malnutrition -Severe Malnutritionrelated to chronic illness (chronic alcoholism)as evidenced by severe fat depletion,severe muscle depletion. 0Encourage patient to to eat -Ensure TIDand MVIwith minerals daily  Constipation -Continue Bowel Regimen with Miralax 17 g po Daily -If Necessary will place on Senna-Docusate 1 tab po BID and consider PRN Bisacodyl Suppositories   AKI Metabolic Acidosis, mild -Resolved. Patient's BUN/Cr is now 17/0.88 and at baseline -Had mild Acidosis with a CO2 of 20, AG of 11, Chloride level of 107 -Was getting IVF with NS at 75 mL/hr butstopped -Continue to Monitor and Trend Intermittently   Elevated AST/Abnormal LFTs -Most Likely 2/2 to EtOH Abuse -Patient's AST has gone from 65 -> 49 -> 54on last checkand slightly and is 74 today -ALT has gone from 59 -> 39 and normalized but is up again to 54 -Continue to Monitor and Trend Intermittently and repeat at least weekly -If necessary will need a RUQ U/S and Acute Hepatitis Panel   Macrocytic Anemia, stable -Patient's Hgb/Hct has been relatively stable but slowly dropped since admission and is now 10.1/32.2 on last check and today is stable at 10.8/34.0 -Anemia Panel recently done 3/19 showed an iron level of 62, U IBC of 262, TIBC of 324, saturation ratios of 19%, ferritin level 274, folate level of 14.0 and vitamin B12 of 288  -FOBT is pending  -Continue to Monitor for S/Sx of Bleeding; Currently no overt bleeding noted -Repeat CBC in the AM   DVT prophylaxis: Enoxaparin 40 mg sq q24h Code Status: FULL CODE Family  Communication: No family present at bedside but spoke to his sister [name] over the phone Disposition Plan: Unsafe D/C Home as he is difficult to place; Needs SNF and He is Homeless with Medical needs   Status is: Inpatient  Remains inpatient appropriate because:Unsafe d/c plan, IV treatments appropriate due to intensity of illness or inability to take PO and Inpatient level of care appropriate due to severity of illness   Dispo: The patient is from: Home              Anticipated d/c is to: SNF              Patient currently is medically stable to d/c.   Difficult to place patient Yes  Consultants:   None  Procedures: None  Antimicrobials:  Anti-infectives (From admission, onward)   None        Subjective: Seen and examined at bedside and he is little bit more confused today than he was yesterday but in no acute distress.  Unfortunately he was denied for SNF so we will need to look for new SNF.  No complaints and denies any pain and resting at bedside with a sitter.  Objective: Vitals:   01/12/21 0500 01/12/21 1346 01/12/21 1913 01/13/21 0433  BP: (!) 96/56 117/78 123/80 114/74  Pulse: 93 (!) 109 (!) 108 98  Resp: 18  16 15 15   Temp: 98 F (36.7 C) 97.9 F (36.6 C) 98.2 F (36.8 C) 97.8 F (36.6 C)  TempSrc:  Oral    SpO2: 99% 99% 98% 97%  Weight:      Height:        Intake/Output Summary (Last 24 hours) at 01/13/2021 1358 Last data filed at 01/13/2021 0745 Gross per 24 hour  Intake 240 ml  Output 1 ml  Net 239 ml   Filed Weights   01/07/21 0607 01/08/21 0500 01/11/21 0500  Weight: 60.6 kg 64.6 kg 64.6 kg   Examination: Physical Exam:  Constitutional: The patient is a thin disheveled African-American male currently in no acute distress appears calm but is still confused Eyes: Lids and conjunctivae normal, sclerae anicteric  ENMT: External Ears, Nose appear normal. Grossly normal hearing. Neck: Appears normal, supple, no cervical masses, normal ROM, no  appreciable thyromegaly; no JVD Respiratory: Diminished to auscultation bilaterally, no wheezing, rales, rhonchi or crackles. Normal respiratory effort and patient is not tachypenic. No accessory muscle use.  Unlabored breathing Cardiovascular: RRR, no murmurs / rubs / gallops. S1 and S2 auscultated. No extremity edema.  Abdomen: Soft, non-tender, non-distended. Bowel sounds positive.  GU: Deferred. Musculoskeletal: No clubbing / cyanosis of digits/nails. No joint deformity upper and lower extremities.  Skin: No rashes, lesions, ulcers on limited skin evaluation. No induration; Warm and dry.  Neurologic: CN 2-12 grossly intact with no focal deficits. Romberg sign and cerebellar reflexes not assessed.  Psychiatric: Impaired judgment and insight.  He is awake and alert but not oriented x 3. Normal mood and appropriate affect.   Data Reviewed: I have personally reviewed following labs and imaging studies  CBC: Recent Labs  Lab 01/08/21 0321 01/10/21 0349 01/12/21 0425  WBC 8.8 7.0 9.2  HGB 9.6* 10.1* 10.8*  HCT 30.6* 32.2* 34.0*  MCV 102.7* 101.9* 103.0*  PLT 187 201 222   Basic Metabolic Panel: Recent Labs  Lab 01/08/21 0321 01/10/21 0349 01/12/21 0425  NA 137 141 138  K 4.2 4.2 4.4  CL 105 109 107  CO2 24 24 20*  GLUCOSE 122* 101* 88  BUN 17 14 17   CREATININE 0.87 0.88 0.79  CALCIUM 9.2 9.8 9.8  MG 1.7 1.9 1.9  PHOS 3.2 4.2 5.2*   GFR: Estimated Creatinine Clearance: 92 mL/min (by C-G formula based on SCr of 0.79 mg/dL). Liver Function Tests: Recent Labs  Lab 01/08/21 0321 01/10/21 0349 01/12/21 0425  AST 49* 54* 74*  ALT 36 39 54*  ALKPHOS 92 92 93  BILITOT 0.7 0.4 0.7  PROT 6.7 7.1 7.6  ALBUMIN 3.0* 3.0* 3.6   No results for input(s): LIPASE, AMYLASE in the last 168 hours. No results for input(s): AMMONIA in the last 168 hours. Coagulation Profile: No results for input(s): INR, PROTIME in the last 168 hours. Cardiac Enzymes: No results for input(s):  CKTOTAL, CKMB, CKMBINDEX, TROPONINI in the last 168 hours. BNP (last 3 results) No results for input(s): PROBNP in the last 8760 hours. HbA1C: No results for input(s): HGBA1C in the last 72 hours. CBG: No results for input(s): GLUCAP in the last 168 hours. Lipid Profile: No results for input(s): CHOL, HDL, LDLCALC, TRIG, CHOLHDL, LDLDIRECT in the last 72 hours. Thyroid Function Tests: No results for input(s): TSH, T4TOTAL, FREET4, T3FREE, THYROIDAB in the last 72 hours. Anemia Panel: No results for input(s): VITAMINB12, FOLATE, FERRITIN, TIBC, IRON, RETICCTPCT in the last 72 hours. Sepsis Labs: No results for input(s): PROCALCITON, LATICACIDVEN in the  last 168 hours.  Recent Results (from the past 240 hour(s))  SARS CORONAVIRUS 2 (TAT 6-24 HRS) Nasopharyngeal Nasopharyngeal Swab     Status: None   Collection Time: 01/11/21  1:26 PM   Specimen: Nasopharyngeal Swab  Result Value Ref Range Status   SARS Coronavirus 2 NEGATIVE NEGATIVE Final    Comment: (NOTE) SARS-CoV-2 target nucleic acids are NOT DETECTED.  The SARS-CoV-2 RNA is generally detectable in upper and lower respiratory specimens during the acute phase of infection. Negative results do not preclude SARS-CoV-2 infection, do not rule out co-infections with other pathogens, and should not be used as the sole basis for treatment or other patient management decisions. Negative results must be combined with clinical observations, patient history, and epidemiological information. The expected result is Negative.  Fact Sheet for Patients: HairSlick.no  Fact Sheet for Healthcare Providers: quierodirigir.com  This test is not yet approved or cleared by the [place] FDA and  has been authorized for detection and/or diagnosis of SARS-CoV-2 by FDA under an Emergency Use Authorization (EUA). This EUA will remain  in effect (meaning this test can be used) for the duration  of the COVID-19 declaration under Se ction 564(b)(1) of the Act, 21 U.S.C. section 360bbb-3(b)(1), unless the authorization is terminated or revoked sooner.  Performed at [facility], [address], [place], [place] [address]      RN Pressure Injury Documentation:     Estimated body mass index is 21.03 kg/m as calculated from the following:   Height as of this encounter: 5\' 9"  (1.753 m).   Weight as of this encounter: 64.6 kg.  Malnutrition Type:  Nutrition Problem: Severe Malnutrition Etiology: chronic illness (chronic alcoholism)  Malnutrition Characteristics:  Signs/Symptoms: severe fat depletion,severe muscle depletion  Nutrition Interventions:  Interventions: Ensure Enlive (each supplement provides 350kcal and 20 grams of protein),MVI  Radiology Studies: No results found.  Scheduled Meds:  enoxaparin (LOVENOX) injection  40 mg Subcutaneous Q24H   feeding supplement  237 mL Oral TID BM   folic acid  1 mg Oral Daily   multivitamin with minerals  1 tablet Oral Daily   polyethylene glycol  17 g Oral Daily   QUEtiapine  25 mg Oral BID   sodium chloride flush  3 mL Intravenous Q12H   thiamine  100 mg Oral Daily   Or   thiamine  100 mg Intravenous Daily   Continuous Infusions:   LOS: 16 days   , DO [facility] PAGER is on AMION  If 7PM-7AM, please contact night-coverage [email or web address]

## 2021-01-14 ENCOUNTER — Encounter (HOSPITAL_COMMUNITY): Payer: Self-pay | Admitting: Internal Medicine

## 2021-01-14 NOTE — TOC Progression Note (Signed)
Transition of Care [facility] - Progression Note    Patient Details  Name: Alvin Clark MRN: [id number] Date of Birth: 06/27/1961  Transition of Care [facility]) CM/SW Contact  Drae Mitzel, [place], [place] Phone Number:[phone] 01/14/2021, 12:20 PM  Clinical Narrative:    Phone call to [facility] director for [facility] to discuss possibility of [employer], LCSW Transition of Care [phone]    Expected Discharge Plan: Skilled Nursing Facility Barriers to Discharge: Financial Resources,Homeless with medical needs,Continued Medical Work up,No SNF bed,Requiring sitter/restraints,SNF Pending payor source - LOG,SNF Pending Medicaid,Family Issues  Expected Discharge Plan and Services Expected Discharge Plan: Skilled Nursing Facility In-house Referral: Clinical Social Work,Financial Counselor     Living arrangements for the past 2 months: No permanent address                                       Social Determinants of Health (SDOH) Interventions    Readmission Risk Interventions No flowsheet data found.

## 2021-01-14 NOTE — Progress Notes (Signed)
PROGRESS NOTE    PERCIVAL GLASHEEN  YIF:[id number] DOB: 1961-04-13 DOA: 12/27/2020 PCP: Patient, No Pcp Per  Brief Narrative: The patient is a 60 year old African-American male with chronic alcoholism who was trying to enter the home that he was evicted from on 12/27/2020.  Homeowners called [employer] and found to be [name].  EMS was called who then called the patient's family and they stated that the patient had concerns for chronic alcoholism and developing memory issues for the last 1 to 2 years.  He was brought to the ED for further evaluation and in the ED he was found to be afebrile, slightly tachycardic blood pressures stable.  Labs showed an AKI and abnormal LFTs as well as slightly elevated white blood cell count of 12.4.  Blood alcohol level content was negative and UDS was negative.  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  Chest x-ray was unremarkable and he was admitted to the hospitalist service for his confusion and has been worked up.  He continues to have intermittent encephalopathy and he is monitor for alcohol withdrawal continues to be on folic acid, multivitamin as well as thiamine.  PT OT evaluated and recommending SNF however he is a difficult placement given his lack of insurance as well as homeless issues.  Not able to stand up by himself when he tries to and has fallen a few times. Still awating SNF placement for Safe Discharge Disposition and was to be today but informed by [facility] that had stated that they cannot take the patient yet at the need to verify his Medicaid application and subsequently declined and cannot accept the patient now. Will need to look for a new SNF.  Assessment & Plan:   Principal Problem:   AKI (acute kidney injury)  (HCC) Active Problems:   Confusion   Leukocytosis   Blurry vision, bilateral   Transaminitis   Constipation   Withdrawal symptoms, alcohol (HCC)   Protein-calorie malnutrition, severe   Acute Metabolic Encephalopathy Chronic involving Memory Issues -Multifactorial EtOH abuse, malnutrition. Patient has chronic alcoholism and family reports worsening memory issues for the last 1 to 2 years -He is brought in for disorientation and abnormal behavior and dehydration may have precipitated an acute delirium and disorientation -His mentation and mental status continues to be waxing and waning and has intermittent episodes of delirium and agitation; Appeared pleasant today -Patient tends to remain calm in the morning and starts to get agitated in the afternoon -Treat underlying causes -Unsure of baseline. -Per EMR family reported memory issues for last 1 to 2 years. -CT scan head negative but did show generalized volume loss which was advanced for his age -95/10 UDS negative -Per staff intermittent episodes of agitation, delirium and has [name] multiple times  -Continues to have Intermittent Encephalopathy slightly worse today, however patient compliant and pleasant. -Sitter Stopped given anticipated D/C to SNF but renewed today given given that he will likely not be discharged as the SNF needs to verify his Medicaid application -C/w Quetiapine 25 mg po BID and with Haloperidol Lactate injection 2 mg q6hptn Agitation but will hold if QTC >470  Chronic EtOH Abuse -Currently not in Withdrawals -C/w folic acid 1 mg p.o. daily, multivitamin with minerals 1 tab p.o. daily, and thiamine 100 mg p.o./IV daily  Hypokalemia -C/w a Potassium goal> 4 -K+ was 4.4 on last check -Continue to Monitor and Replete as Necessary -Repeat CMP intermittently  Hypomagnesmia -C/w a Magnesium goal> 2 -Mag Level was 1.9 and will replete with IV Mag Sulfate 1 gram -Continue to Monitor and Replete as  Necessary -Repeat Mag Level intermittently   Hypophosphatemia -C/w a Phosphorus goal> 2.5 -Phos Levelwas 5.2 on last check -Continue to Monitor and Replete as Necessary -Repeat Phos Level intermittently   Impaired Mobility -PT continues recommends SNF -Ambulate patient every shift as able and has a Recruitment consultantafety sitter (renewed) given Multiple FALLS  Severe Protein Calorie Malnutrition -Severe Malnutritionrelated to chronic illness (chronic alcoholism)as evidenced by severe fat depletion,severe muscle depletion. 0Encourage patient to to eat -Ensure TIDand MVIwith minerals daily  Constipation -Continue Bowel Regimen with Miralax 17 g po Daily -If Necessary will place on Senna-Docusate 1 tab po BID and consider PRN Bisacodyl Suppositories   AKI Metabolic Acidosis, mild -Resolved. Patient's BUN/Cr is now 17/0.88 and at baseline -Had mild Acidosis with a CO2 of 20, AG of 11, Chloride level of 107 on last check -Was getting IVF with NS at 75 mL/hr butstopped -Continue to Monitor and Trend Intermittently   Elevated AST/Abnormal LFTs -Most Likely 2/2 to EtOH Abuse -Patient's AST has gone from 65 -> 49 -> 54on last checkand slightly and is 74 today -ALT has gone from 59 -> 39 and normalized but is up again to 54 -Continue to Monitor and Trend Intermittently and repeat at least weekly; Repeat on 01/16/21 -If necessary will need a RUQ U/S and Acute Hepatitis Panel   Macrocytic Anemia, stable -Patient's Hgb/Hct has been relatively stable but slowly dropped since admission and is now 10.1/32.2 on last check and today is stable at 10.8/34.0 -Anemia Panel recently done 3/19 showed an iron level of 62, U IBC of 262, TIBC of 324, saturation ratios of 19%, ferritin level 274, folate level of 14.0 and vitamin B12 of 288  -FOBT is pending  -Continue to Monitor for S/Sx of Bleeding; Currently no overt bleeding noted -Repeat CBC in the AM   DVT prophylaxis: Enoxaparin 40 mg sq  q24h Code Status: FULL CODE Family Communication: No family present at bedside but spoke to his sister Newton Piggatanya over the phone Disposition Plan: Unsafe D/C Home as he is difficult to place; Needs SNF and He is Homeless with Medical needs   Status is: Inpatient  Remains inpatient appropriate because:Unsafe d/c plan, IV treatments appropriate due to intensity of illness or inability to take PO and Inpatient level of care appropriate due to severity of illness   Dispo: The patient is from: Home              Anticipated d/c is to: SNF              Patient currently is medically stable to d/c.   Difficult to place patient Yes  Consultants:   None  Procedures: None  Antimicrobials:  Anti-infectives (From admission, onward)   None        Subjective: Seen and examined at bedside and and he is doing fairly well.  Sitting up eating his breakfast and asking for salt-and-pepper.  It [birth year] birthday today and he states that he is [id number].  No other concerns or complaints at this time.  Objective: Vitals:   01/13/21 1402 01/13/21 1403 01/13/21 2028 01/14/21 0526  BP: 117/79 117/79 123/84 119/86  Pulse: (!) 105 (!) 106 100 93  Resp: 18 18 17  16  Temp:  98.3 F (36.8 C) 98.3 F (36.8 C) 98.2 F (36.8 C)  TempSrc:   Oral Oral  SpO2: 98% 98% 99% 96%  Weight:      Height:        Intake/Output Summary (Last 24 hours) at 01/14/2021 1051 Last data filed at 01/14/2021 0900 Gross per 24 hour  Intake 716 ml  Output -  Net 716 ml   Filed Weights   01/07/21 0607 01/08/21 0500 01/11/21 0500  Weight: 60.6 kg 64.6 kg 64.6 kg   Examination: Physical Exam:  Constitutional: The patient is a thin disheveled AAM currently in NAD and appears calm eating  Eyes: Lids and conjunctivae normal, sclerae anicteric  ENMT: External Ears, Nose appear normal. Grossly normal hearing.  Neck: Appears normal, supple, no cervical masses, normal ROM, no appreciable thyromegaly Respiratory: Diminished to  auscultation bilaterally, no wheezing, rales, rhonchi or crackles. Normal respiratory effort and patient is not tachypenic. No accessory muscle use. Unlabored breathing  Cardiovascular: RRR, no murmurs / rubs / gallops. S1 and S2 auscultated. No edema Abdomen: Soft, non-tender, non-distended. Bowel sounds positive.  GU: Deferred. Musculoskeletal: No clubbing / cyanosis of digits/nails. No joint deformity upper and lower extremities. Skin: No rashes, lesions, ulcers on a limited skin evaluation. No induration; Warm and dry.  Neurologic: CN 2-12 grossly intact with no focal deficits. Romberg sign and cerebellar reflexes not assessed.  Psychiatric: Normal judgment and insight. Alert and oriented x 3. Normal mood and appropriate affect.   Data Reviewed: I have personally reviewed following labs and imaging studies  CBC: Recent Labs  Lab 01/08/21 0321 01/10/21 0349 01/12/21 0425  WBC 8.8 7.0 9.2  HGB 9.6* 10.1* 10.8*  HCT 30.6* 32.2* 34.0*  MCV 102.7* 101.9* 103.0*  PLT 187 201 222   Basic Metabolic Panel: Recent Labs  Lab 01/08/21 0321 01/10/21 0349 01/12/21 0425  NA 137 141 138  K 4.2 4.2 4.4  CL 105 109 107  CO2 24 24 20*  GLUCOSE 122* 101* 88  BUN 17 14 17   CREATININE 0.87 0.88 0.79  CALCIUM 9.2 9.8 9.8  MG 1.7 1.9 1.9  PHOS 3.2 4.2 5.2*   GFR: Estimated Creatinine Clearance: 90.8 mL/min (by C-G formula based on SCr of 0.79 mg/dL). Liver Function Tests: Recent Labs  Lab 01/08/21 0321 01/10/21 0349 01/12/21 0425  AST 49* 54* 74*  ALT 36 39 54*  ALKPHOS 92 92 93  BILITOT 0.7 0.4 0.7  PROT 6.7 7.1 7.6  ALBUMIN 3.0* 3.0* 3.6   No results for input(s): LIPASE, AMYLASE in the last 168 hours. No results for input(s): AMMONIA in the last 168 hours. Coagulation Profile: No results for input(s): INR, PROTIME in the last 168 hours. Cardiac Enzymes: No results for input(s): CKTOTAL, CKMB, CKMBINDEX, TROPONINI in the last 168 hours. BNP (last 3 results) No results for  input(s): PROBNP in the last 8760 hours. HbA1C: No results for input(s): HGBA1C in the last 72 hours. CBG: No results for input(s): GLUCAP in the last 168 hours. Lipid Profile: No results for input(s): CHOL, HDL, LDLCALC, TRIG, CHOLHDL, LDLDIRECT in the last 72 hours. Thyroid Function Tests: No results for input(s): TSH, T4TOTAL, FREET4, T3FREE, THYROIDAB in the last 72 hours. Anemia Panel: No results for input(s): VITAMINB12, FOLATE, FERRITIN, TIBC, IRON, RETICCTPCT in the last 72 hours. Sepsis Labs: No results for input(s): PROCALCITON, LATICACIDVEN in the last 168 hours.  Recent Results (from the past 240 hour(s))  SARS CORONAVIRUS 2 (TAT 6-24 HRS) Nasopharyngeal Nasopharyngeal Swab  Status: None   Collection Time: 01/11/21  1:26 PM   Specimen: Nasopharyngeal Swab  Result Value Ref Range Status   SARS Coronavirus 2 NEGATIVE NEGATIVE Final    Comment: (NOTE) SARS-CoV-2 target nucleic acids are NOT DETECTED.  The SARS-CoV-2 RNA is generally detectable in upper and lower respiratory specimens during the acute phase of infection. Negative results do not preclude SARS-CoV-2 infection, do not rule out co-infections with other pathogens, and should not be used as the sole basis for treatment or other patient management decisions. Negative results must be combined with clinical observations, patient history, and epidemiological information. The expected result is Negative.  Fact Sheet for Patients: HairSlick.no  Fact Sheet for Healthcare Providers: quierodirigir.com  This test is not yet approved or cleared by the [place] FDA and  has been authorized for detection and/or diagnosis of SARS-CoV-2 by FDA under an Emergency Use Authorization (EUA). This EUA will remain  in effect (meaning this test can be used) for the duration of the COVID-19 declaration under Se ction 564(b)(1) of the Act, 21 U.S.C. section  360bbb-3(b)(1), unless the authorization is terminated or revoked sooner.  Performed at [facility], [address], [place], [place] [address]      RN Pressure Injury Documentation:     Estimated body mass index is 21.03 kg/m as calculated from the following:   Height as of this encounter: 5\' 9"  (1.753 m).   Weight as of this encounter: 64.6 kg.  Malnutrition Type:  Nutrition Problem: Severe Malnutrition Etiology: chronic illness (chronic alcoholism)  Malnutrition Characteristics:  Signs/Symptoms: severe fat depletion,severe muscle depletion  Nutrition Interventions:  Interventions: Ensure Enlive (each supplement provides 350kcal and 20 grams of protein),MVI  Radiology Studies: No results found.  Scheduled Meds: . enoxaparin (LOVENOX) injection  40 mg Subcutaneous Q24H  . feeding supplement  237 mL Oral TID BM  . folic acid  1 mg Oral Daily  . multivitamin with minerals  1 tablet Oral Daily  . polyethylene glycol  17 g Oral Daily  . QUEtiapine  25 mg Oral BID  . sodium chloride flush  3 mL Intravenous Q12H  . thiamine  100 mg Oral Daily   Or  . thiamine  100 mg Intravenous Daily   Continuous Infusions:   LOS: 17 days   , DO [facility] PAGER is on AMION  If 7PM-7AM, please contact night-coverage [email or web address]

## 2021-01-15 ENCOUNTER — Encounter (HOSPITAL_COMMUNITY): Payer: Self-pay | Admitting: Internal Medicine

## 2021-01-15 DIAGNOSIS — R945 Abnormal results of liver function studies: Secondary | ICD-10-CM

## 2021-01-15 LAB — CBC
HCT: 33.8 % — ABNORMAL LOW (ref 39.0–52.0)
Hemoglobin: 10.6 g/dL — ABNORMAL LOW (ref 13.0–17.0)
MCH: 31.6 pg (ref 26.0–34.0)
MCHC: 31.4 g/dL (ref 30.0–36.0)
MCV: 100.9 fL — ABNORMAL HIGH (ref 80.0–100.0)
Platelets: 256 10*3/uL (ref 150–400)
RBC: 3.35 MIL/uL — ABNORMAL LOW (ref 4.22–5.81)
RDW: 15.4 % (ref 11.5–15.5)
WBC: 9.8 10*3/uL (ref 4.0–10.5)
nRBC: 0 % (ref 0.0–0.2)

## 2021-01-15 LAB — COMPREHENSIVE METABOLIC PANEL
ALT: 59 U/L — ABNORMAL HIGH (ref 0–44)
AST: 74 U/L — ABNORMAL HIGH (ref 15–41)
Albumin: 3.4 g/dL — ABNORMAL LOW (ref 3.5–5.0)
Alkaline Phosphatase: 94 U/L (ref 38–126)
Anion gap: 9 (ref 5–15)
BUN: 21 mg/dL — ABNORMAL HIGH (ref 6–20)
CO2: 27 mmol/L (ref 22–32)
Calcium: 9.9 mg/dL (ref 8.9–10.3)
Chloride: 103 mmol/L (ref 98–111)
Creatinine, Ser: 1 mg/dL (ref 0.61–1.24)
GFR, Estimated: >60 mL/min (ref >60)
Glucose, Bld: 92 mg/dL (ref 70–99)
Potassium: 4.1 mmol/L (ref 3.5–5.1)
Sodium: 139 mmol/L (ref 135–145)
Total Bilirubin: 0.7 mg/dL (ref 0.3–1.2)
Total Protein: 7.7 g/dL (ref 6.5–8.1)

## 2021-01-15 LAB — PHOSPHORUS: Phosphorus: 5 mg/dL — ABNORMAL HIGH (ref 2.5–4.6)

## 2021-01-15 LAB — MAGNESIUM: Magnesium: 1.8 mg/dL (ref 1.7–2.4)

## 2021-01-15 MED ORDER — MAGNESIUM SULFATE 2 GM/50ML IV SOLN
2.0000 g | Freq: Once | INTRAVENOUS | Status: AC
  Administered 2021-01-15: 2 g via INTRAVENOUS
  Filled 2021-01-15: qty 50

## 2021-01-15 NOTE — Progress Notes (Signed)
**Note Alvin-Identified via Obfuscation** PROGRESS NOTE    Alvin Clark  OXB:[id number] DOB: Mar 07, 1961 DOA: 12/27/2020 PCP: Patient, No Pcp Per  Brief Narrative: The patient is a 60 year old African-American male with chronic alcoholism who was trying to enter the home that he was evicted from on 12/27/2020.  Homeowners called [employer] and found to be Alvin Clark disoriented.  EMS was called who then called the patient's family and they stated that the patient had concerns for chronic alcoholism and developing memory issues for the last 1 to 2 years.  He was brought to the ED for further evaluation and in the ED he was found to be afebrile, slightly tachycardic blood pressures stable.  Labs showed an AKI and abnormal LFTs as well as slightly elevated white blood cell count of 12.4.  Blood alcohol level content was negative and UDS was negative.  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  Chest x-ray was unremarkable and he was admitted to the hospitalist service for his confusion and has been worked up.  He continues to have intermittent encephalopathy and he is monitor for alcohol withdrawal continues to be on folic acid, multivitamin as well as thiamine.  PT OT evaluated and recommending SNF however he is a difficult placement given his lack of insurance as well as homeless issues.  Not able to stand up by himself when he tries to and has fallen a few times. Still awating SNF placement for Safe Discharge Disposition and was to be today but informed by [facility] that had stated that they cannot take the patient yet at the need to verify his Medicaid application and subsequently declined and cannot accept the patient now. Will need to look for a new SNF.  Assessment & Plan:   Principal Problem:   AKI (acute kidney injury)  (HCC) Active Problems:   Confusion   Leukocytosis   Blurry vision, bilateral   Transaminitis   Constipation   Withdrawal symptoms, alcohol (HCC)   Protein-calorie malnutrition, severe   Acute Metabolic Encephalopathy Chronic involving Memory Issues -Multifactorial EtOH abuse, malnutrition. Patient has chronic alcoholism and family reports worsening memory issues for the last 1 to 2 years -He is brought in for disorientation and abnormal behavior and dehydration may have precipitated an acute delirium and disorientation -His mentation and mental status continues to be waxing and waning and has intermittent episodes of delirium and agitation; Appeared pleasant today -Patient tends to remain calm in the morning and starts to get agitated in the afternoon -Treat underlying causes -Unsure of baseline. -Per EMR family reported memory issues for last 1 to 2 years. -CT scan head negative but did show generalized volume loss which was advanced for his age -36/10 UDS negative -Per staff intermittent episodes of agitation, delirium and has Alvin Clark multiple times  -Continues to have Intermittent Encephalopathy slightly worse today, however patient compliant and pleasant. -Youth worker given impulsiveness -C/w Quetiapine 25 mg po BID and with Haloperidol Lactate injection 2 mg q6hptn Agitation but will hold if QTC >470  Chronic EtOH Abuse -Currently not in Withdrawals -C/w folic acid 1 mg p.o. daily, multivitamin with minerals 1 tab p.o. daily, and thiamine 100 mg p.o./IV daily  Hypokalemia -C/w a Potassium goal> 4 -K+ was 4.1 on last check -Continue to Monitor and Replete as Necessary -Repeat CMP intermittently  Hypomagnesmia -C/w a Magnesium goal> 2 -Mag Level was 1.8 so will replete with IV Mag Sulfate 2 grams -Continue to Monitor and Replete as Necessary -Repeat Mag Level intermittently   Hypophosphatemia -C/w a Phosphorus goal> 2.5 -Phos Levelwas 5.0 on last  check -Continue to Monitor and Replete as Necessary -Repeat Phos Level intermittently   Impaired Mobility -PT continues recommends SNF -Ambulate patient every shift as able and has a Recruitment consultant (renewed) given Multiple FALLS  Severe Protein Calorie Malnutrition -Severe Malnutritionrelated to chronic illness (chronic alcoholism)as evidenced by severe fat depletion,severe muscle depletion. 0Encourage patient to to eat -Ensure TIDand MVIwith minerals daily  Constipation -Continue Bowel Regimen with Miralax 17 g po Daily -If Necessary will place on Senna-Docusate 1 tab po BID and consider PRN Bisacodyl Suppositories   AKI Metabolic Acidosis, mild -Resolved. Patient's BUN/Cr is now gone from 17/0.88 -> 17/0.79 -> 21/1.00 and at baseline -Had mild Acidosis with a CO2 of 20, AG of 11, Chloride level of 107 on last check but this is improved as CO2 is now 27, AG is 9, Chloride Level is 103 -Was getting IVF with NS at 75 mL/hr butstopped -Continue to Monitor and Trend Intermittently   Elevated AST/Abnormal LFTs -Most Likely 2/2 to EtOH Abuse -Patient's AST has gone from 65 -> 49 -> 54-> 74on last checkand slightly and is 74 today -ALT has gone from 59 -> 39 and normalized but is up again to 54 -> 59 -Continue to Monitor and Trend Intermittently and repeat at least weekly; Repeat on 01/16/21 -If necessary will need a RUQ U/S and Acute Hepatitis Panel   Macrocytic Anemia, stable -Patient's Hgb/Hct has been relatively stable but slowly dropped since admission but is stable at 10.6/33.8 -Anemia Panel recently done 3/19 showed an iron level of 62, U IBC of 262, TIBC of 324, saturation ratios of 19%, ferritin level 274, folate level of 14.0 and vitamin B12 of 288  -FOBT is pending  -Continue to Monitor for S/Sx of Bleeding; Currently no overt bleeding noted -Repeat CBC in the AM   DVT prophylaxis: Enoxaparin 40 mg sq q24h Code Status: FULL CODE Family Communication: No  family present at bedside but spoke to his sister Alvin Clark over the phone Disposition Plan: Unsafe D/C Home as he is difficult to place; Needs SNF and He is Homeless with Medical needs   Status is: Inpatient  Remains inpatient appropriate because:Unsafe d/c plan, IV treatments appropriate due to intensity of illness or inability to take PO and Inpatient level of care appropriate due to severity of illness   Dispo: The patient is from: Home              Anticipated d/c is to: SNF              Patient currently is medically stable to d/c.   Difficult to place patient Yes  Consultants:   None  Procedures: None  Antimicrobials:  Anti-infectives (From admission, onward)   None        Subjective: Seen and examined at bedside and he was watching television without any complaints.  Denied any nausea or vomiting.  No pain.  Felt okay.  No other concerns declines at this time.  Objective: Vitals:   01/14/21 0526 01/14/21 1412 01/14/21 2020 01/15/21 0635  BP: 119/86 124/84 125/87 127/87  Pulse: 93 98 100 99  Resp: 16 17 17 19   Temp: 98.2 F (36.8 C) 98 F (36.7 C) 98.1 F (36.7 C) 98 F (36.7 C)  TempSrc: Oral Oral  Oral  SpO2: 96% 99% 97% 98%  Weight:    64.2 kg  Height:        Intake/Output Summary (Last 24 hours) at 01/15/2021 0827 Last data filed at 01/15/2021 0205 Gross per 24 hour  Intake 1306 ml  Output --  Net 1306 ml   Filed Weights   01/08/21 0500 01/11/21 0500 01/15/21 0635  Weight: 64.6 kg 64.6 kg 64.2 kg   Examination: Physical Exam:  Constitutional: The patient is a thin disheveled African-American male currently no acute distress and appears calm watching television Eyes: Lids and conjunctivae normal, sclerae anicteric  ENMT: External Ears, Nose appear normal. Grossly normal hearing. Neck: Appears normal, supple, no cervical masses, normal ROM, no appreciable thyromegaly; no JVD Respiratory: Diminished to auscultation bilaterally, no wheezing, rales,  rhonchi or crackles. Normal respiratory effort and patient is not tachypenic. No accessory muscle use.  Cardiovascular: RRR, no murmurs / rubs / gallops. S1 and S2 auscultated.  No extremity edema Abdomen: Soft, non-tender, non-distended. Bowel sounds positive.  GU: Deferred. Musculoskeletal: No clubbing / cyanosis of digits/nails. No joint deformity upper and lower extremities.  Skin: No rashes, lesions, ulcers on limited skin evaluation. No induration; Warm and dry.  Neurologic: CN 2-12 grossly intact with no focal deficits. Romberg sign and cerebellar reflexes not assessed.  Psychiatric: Normal judgment and insight. Alert awake and has a pleasant mood and affect.   Data Reviewed: I have personally reviewed following labs and imaging studies  CBC: Recent Labs  Lab 01/10/21 0349 01/12/21 0425 01/15/21 0336  WBC 7.0 9.2 9.8  HGB 10.1* 10.8* 10.6*  HCT 32.2* 34.0* 33.8*  MCV 101.9* 103.0* 100.9*  PLT 201 222 256   Basic Metabolic Panel: Recent Labs  Lab 01/10/21 0349 01/12/21 0425 01/15/21 0336  NA 141 138 139  K 4.2 4.4 4.1  CL 109 107 103  CO2 24 20* 27  GLUCOSE 101* 88 92  BUN 14 17 21*  CREATININE 0.88 0.79 1.00  CALCIUM 9.8 9.8 9.9  MG 1.9 1.9 1.8  PHOS 4.2 5.2* 5.0*   GFR: Estimated Creatinine Clearance: 72.2 mL/min (by C-G formula based on SCr of 1 mg/dL). Liver Function Tests: Recent Labs  Lab 01/10/21 0349 01/12/21 0425 01/15/21 0336  AST 54* 74* 74*  ALT 39 54* 59*  ALKPHOS 92 93 94  BILITOT 0.4 0.7 0.7  PROT 7.1 7.6 7.7  ALBUMIN 3.0* 3.6 3.4*   No results for input(s): LIPASE, AMYLASE in the last 168 hours. No results for input(s): AMMONIA in the last 168 hours. Coagulation Profile: No results for input(s): INR, PROTIME in the last 168 hours. Cardiac Enzymes: No results for input(s): CKTOTAL, CKMB, CKMBINDEX, TROPONINI in the last 168 hours. BNP (last 3 results) No results for input(s): PROBNP in the last 8760 hours. HbA1C: No results for  input(s): HGBA1C in the last 72 hours. CBG: No results for input(s): GLUCAP in the last 168 hours. Lipid Profile: No results for input(s): CHOL, HDL, LDLCALC, TRIG, CHOLHDL, LDLDIRECT in the last 72 hours. Thyroid Function Tests: No results for input(s): TSH, T4TOTAL, FREET4, T3FREE, THYROIDAB in the last 72 hours. Anemia Panel: No results for input(s): VITAMINB12, FOLATE, FERRITIN, TIBC, IRON, RETICCTPCT in the last 72 hours. Sepsis Labs: No results for input(s): PROCALCITON, LATICACIDVEN in the last 168 hours.  Recent Results (from the past 240 hour(s))  SARS CORONAVIRUS 2 (TAT 6-24 HRS) Nasopharyngeal Nasopharyngeal Swab     Status: None   Collection Time: 01/11/21  1:26 PM   Specimen: Nasopharyngeal Swab  Result Value Ref Range Status   SARS Coronavirus 2 NEGATIVE NEGATIVE Final    Comment: (NOTE) SARS-CoV-2 target nucleic acids are NOT DETECTED.  The SARS-CoV-2 RNA is generally detectable in upper and lower respiratory specimens during the acute phase of infection. Negative results do not preclude SARS-CoV-2 infection, do not rule out co-infections with other pathogens, and should not be used as the sole basis for treatment or other patient management decisions. Negative results must be combined with clinical observations, patient history, and epidemiological information. The expected result is Negative.  Fact Sheet for Patients: HairSlick.no  Fact Sheet for Healthcare Providers: quierodirigir.com  This test is not yet approved or cleared by the [place] FDA and  has been authorized for detection and/or diagnosis of SARS-CoV-2 by FDA under an Emergency Use Authorization (EUA). This EUA will remain  in effect (meaning this test can be used) for the duration of the COVID-19 declaration under Se ction 564(b)(1) of the Act, 21 U.S.C. section 360bbb-3(b)(1), unless the authorization is terminated or revoked  sooner.  Performed at [facility], [address], [place], [place] [address]      RN Pressure Injury Documentation:     Estimated body mass index is 20.9 kg/m as calculated from the following:   Height as of this encounter: 5\' 9"  (1.753 m).   Weight as of this encounter: 64.2 kg.  Malnutrition Type:  Nutrition Problem: Severe Malnutrition Etiology: chronic illness (chronic alcoholism)  Malnutrition Characteristics:  Signs/Symptoms: severe fat depletion,severe muscle depletion  Nutrition Interventions:  Interventions: Ensure Enlive (each supplement provides 350kcal and 20 grams of protein),MVI  Radiology Studies: No results found.  Scheduled Meds: . enoxaparin (LOVENOX) injection  40 mg Subcutaneous Q24H  . feeding supplement  237 mL Oral TID BM  . folic acid  1 mg Oral Daily  . multivitamin with minerals  1 tablet Oral Daily  . polyethylene glycol  17 g Oral Daily  . QUEtiapine  25 mg Oral BID  . sodium chloride flush  3 mL Intravenous Q12H  . thiamine  100 mg Oral Daily   Or  . thiamine  100 mg Intravenous Daily   Continuous Infusions:   LOS: 18 days   , DO [facility] PAGER is on AMION  If 7PM-7AM, please contact night-coverage [email or web address]

## 2021-01-15 NOTE — TOC Progression Note (Signed)
Transition of Care [facility]) - Progression Note    Patient Details  Name: ADEYEMI HAMAD MRN: [id number] Date of Birth: Jun 11, 1961  Transition of [facility]) CM/SW Contact  Darleene Cleaver, [place] Phone Number: 01/15/2021, 3:31 PM  Clinical Narrative:     CSW attempted to reach admissions at [facility], CSW left a message on voice mail waiting for a call back.   Expected Discharge Plan: Skilled Nursing Facility Barriers to Discharge: Financial Resources,Homeless with medical needs,Continued Medical Work up,No SNF bed,Requiring sitter/restraints,SNF Pending payor source - LOG,SNF Pending Medicaid,Family Issues  Expected Discharge Plan and Services Expected Discharge Plan: Skilled Nursing Facility In-house Referral: Clinical Social Work,Financial Counselor     Living arrangements for the past 2 months: No permanent address                                       Social Determinants of Health (SDOH) Interventions    Readmission Risk Interventions No flowsheet data found.

## 2021-01-16 NOTE — Progress Notes (Signed)
PROGRESS NOTE    Alvin Clark  MBW:[id number] DOB: 1961/02/05 DOA: 12/27/2020 PCP: Patient, No Pcp Per  Brief Narrative: The patient is a 60 year old African-American male with chronic alcoholism who was trying to enter the home that he was evicted from on 12/27/2020.  Homeowners called [employer] and found to be [name].  EMS was called who then called the patient's family and they stated that the patient had concerns for chronic alcoholism and developing memory issues for the last 1 to 2 years.  He was brought to the ED for further evaluation and in the ED he was found to be afebrile, slightly tachycardic blood pressures stable.  Labs showed an AKI and abnormal LFTs as well as slightly elevated white blood cell count of 12.4.  Blood alcohol level content was negative and UDS was negative.  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  Chest x-ray was unremarkable and he was admitted to the hospitalist service for his confusion and has been worked up.  He continues to have intermittent encephalopathy and he is monitor for alcohol withdrawal continues to be on folic acid, multivitamin as well as thiamine.  PT OT evaluated and recommending SNF however he is a difficult placement given his lack of insurance as well as homeless issues.  Not able to stand up by himself when he tries to and has fallen a few times. Still awating SNF placement for Safe Discharge Disposition and was to be today but informed by [facility] that had stated that they cannot take the patient yet at the need to verify his Medicaid application and subsequently declined and cannot accept the patient now. Will need to look for a new SNF.  Assessment & Plan:   Principal Problem:   AKI (acute kidney injury)  (HCC) Active Problems:   Confusion   Leukocytosis   Blurry vision, bilateral   Transaminitis   Constipation   Withdrawal symptoms, alcohol (HCC)   Protein-calorie malnutrition, severe   Acute Metabolic Encephalopathy Chronic involving Memory Issues -Multifactorial EtOH abuse, malnutrition. Patient has chronic alcoholism and family reports worsening memory issues for the last 1 to 2 years -He is brought in for disorientation and abnormal behavior and dehydration may have precipitated an acute delirium and disorientation -His mentation and mental status continues to be waxing and waning and has intermittent episodes of delirium and agitation; Appeared pleasant today -Patient tends to remain calm in the morning and starts to get agitated in the afternoon -Treat underlying causes -Unsure of baseline. -Per EMR family reported memory issues for last 1 to 2 years. -CT scan head negative but did show generalized volume loss which was advanced for his age -26/10 UDS negative -Per staff intermittent episodes of agitation, delirium and has [name] multiple times  -Continues to have Intermittent Encephalopathy and was confused today but is pleasant.  He was asleep when I woke him up he was very disoriented and thought he needed to call somebody -Youth worker given impulsiveness -C/w Quetiapine 25 mg po BID and with Haloperidol Lactate injection 2 mg q6hptn Agitation but will hold if QTC >470  Chronic EtOH Abuse -Currently not in Withdrawals -C/w folic acid 1 mg p.o. daily, multivitamin with minerals 1 tab p.o. daily, and thiamine 100 mg p.o./IV daily  Hypokalemia -C/w a Potassium  goal> 4 -K+ was 4.1 on last check -Continue to Monitor and Replete as Necessary -Repeat CMP intermittently  Hypomagnesmia -C/w a Magnesium goal> 2 -Mag Level was 1.8 so will replete with IV Mag Sulfate 2 grams -Continue to Monitor and Replete as Necessary -Repeat Mag Level intermittently    Hypophosphatemia -C/w a Phosphorus goal> 2.5 -Phos Levelwas 5.0 on last check 01/15/2021 -Continue to Monitor and Replete as Necessary -Repeat Phos Level intermittently   Impaired Mobility -PT continues recommends SNF -Ambulate patient every shift as able and has a Recruitment consultant (renewed) given Multiple FALLS  Severe Protein Calorie Malnutrition -Severe Malnutritionrelated to chronic illness (chronic alcoholism)as evidenced by severe fat depletion,severe muscle depletion. 0Encourage patient to to eat -Ensure TIDand MVIwith minerals daily  Constipation -Continue Bowel Regimen with Miralax 17 g po Daily -If Necessary will place on Senna-Docusate 1 tab po BID and consider PRN Bisacodyl Suppositories   AKI Metabolic Acidosis, mild -Resolved. Patient's BUN/Cr is now gone from 17/0.88 -> 17/0.79 -> 21/1.00 and at baseline -Had mild Acidosis with a CO2 of 20, AG of 11, Chloride level of 107 on last check but this is improved as CO2 is now 27, AG is 9, Chloride Level is 103 -Was getting IVF with NS at 75 mL/hr butstopped -Continue to Monitor and Trend Intermittently   Elevated AST/Abnormal LFTs -Most Likely 2/2 to EtOH Abuse -Patient's AST has gone from 65 -> 49 -> 54-> 74on last checkand slightly and is 74 yesterday -ALT has gone from 59 -> 39 and normalized but is up again to 54 -> 59 yesterday -Continue to Monitor and Trend Intermittently and repeat at least weekly; Repeat on 01/17/21 -If necessary will need a RUQ U/S and Acute Hepatitis Panel   Macrocytic Anemia, stable -Patient's Hgb/Hct has been relatively stable but slowly dropped since admission but is stable at 10.6/33.8 -Anemia Panel recently done 3/19 showed an iron level of 62, U IBC of 262, TIBC of 324, saturation ratios of 19%, ferritin level 274, folate level of 14.0 and vitamin B12 of 288  -FOBT is pending  -Continue to Monitor for S/Sx of Bleeding; Currently no overt bleeding noted -Repeat CBC in the  AM   DVT prophylaxis: Enoxaparin 40 mg sq q24h Code Status: FULL CODE Family Communication: No family present at bedside but spoke to his sister [name] over the phone Disposition Plan: Unsafe D/C Home as he is difficult to place; Needs SNF and He is Homeless with Medical needs   Status is: Inpatient  Remains inpatient appropriate because:Unsafe d/c plan, IV treatments appropriate due to intensity of illness or inability to take PO and Inpatient level of care appropriate due to severity of illness   Dispo: The patient is from: Home              Anticipated d/c is to: SNF              Patient currently is medically stable to d/c.   Difficult to place patient Yes  Consultants:   None  Procedures: None  Antimicrobials:  Anti-infectives (From admission, onward)   None        Subjective: Seen and examined at bedside and he was resting and asleep when I woke him up.  He is very confused when he woke up and thought he needed to call somebody.  No nausea or vomiting.  Denies any pain.  No other concerns or complaints at this time.  Objective: Vitals:   01/15/21 0635 01/15/21 1329 01/15/21 2050 01/16/21  0537  BP: 127/87 114/70 133/90 125/89  Pulse: 99 (!) 101 89 93  Resp: 19 18 18 18   Temp: 98 F (36.7 C) 98.6 F (37 C) 98 F (36.7 C) 97.7 F (36.5 C)  TempSrc: Oral Oral Oral Oral  SpO2: 98% 98% 98% 100%  Weight: 64.2 kg     Height:        Intake/Output Summary (Last 24 hours) at 01/16/2021 0844 Last data filed at 01/16/2021 [id number] Gross per 24 hour  Intake 3227 ml  Output 0 ml  Net 3227 ml   Filed Weights   01/08/21 0500 01/11/21 0500 01/15/21 0635  Weight: 64.6 kg 64.6 kg 64.2 kg   Examination: Physical Exam:  Constitutional: The patient is a thin disheveled African-American male currently no acute distress and is calm and asleep when I walked in I woke him up and he was a little confused  Eyes: Lids and conjunctivae normal, sclerae anicteric  ENMT: External  Ears, Nose appear normal. Grossly normal hearing.  Neck: Appears normal, supple, no cervical masses, normal ROM, no appreciable thyromegaly; no JVD Respiratory: Diminished to auscultation bilaterally, no wheezing, rales, rhonchi or crackles. Normal respiratory effort and patient is not tachypenic. No accessory muscle use.  Unlabored breathing Cardiovascular: RRR, no murmurs / rubs / gallops. S1 and S2 auscultated. No extremity edema. Abdomen: Soft, non-tender, non-distended. Bowel sounds positive.  GU: Deferred. Musculoskeletal: No clubbing / cyanosis of digits/nails. No joint deformity upper and lower extremities.  Skin: No rashes, lesions, ulcers on limited skin evaluation. No induration; Warm and dry.  Neurologic: CN 2-12 grossly intact with no focal deficits. Romberg sign and cerebellar reflexes not assessed.  Psychiatric: Impaired judgment and insight.  He is somnolent and a little drowsy but easily arousable and not fully oriented. Normal mood and appropriate affect.   Data Reviewed: I have personally reviewed following labs and imaging studies  CBC: Recent Labs  Lab 01/10/21 0349 01/12/21 0425 01/15/21 0336  WBC 7.0 9.2 9.8  HGB 10.1* 10.8* 10.6*  HCT 32.2* 34.0* 33.8*  MCV 101.9* 103.0* 100.9*  PLT 201 222 256   Basic Metabolic Panel: Recent Labs  Lab 01/10/21 0349 01/12/21 0425 01/15/21 0336  NA 141 138 139  K 4.2 4.4 4.1  CL 109 107 103  CO2 24 20* 27  GLUCOSE 101* 88 92  BUN 14 17 21*  CREATININE 0.88 0.79 1.00  CALCIUM 9.8 9.8 9.9  MG 1.9 1.9 1.8  PHOS 4.2 5.2* 5.0*   GFR: Estimated Creatinine Clearance: 72.2 mL/min (by C-G formula based on SCr of 1 mg/dL). Liver Function Tests: Recent Labs  Lab 01/10/21 0349 01/12/21 0425 01/15/21 0336  AST 54* 74* 74*  ALT 39 54* 59*  ALKPHOS 92 93 94  BILITOT 0.4 0.7 0.7  PROT 7.1 7.6 7.7  ALBUMIN 3.0* 3.6 3.4*   No results for input(s): LIPASE, AMYLASE in the last 168 hours. No results for input(s): AMMONIA in  the last 168 hours. Coagulation Profile: No results for input(s): INR, PROTIME in the last 168 hours. Cardiac Enzymes: No results for input(s): CKTOTAL, CKMB, CKMBINDEX, TROPONINI in the last 168 hours. BNP (last 3 results) No results for input(s): PROBNP in the last 8760 hours. HbA1C: No results for input(s): HGBA1C in the last 72 hours. CBG: No results for input(s): GLUCAP in the last 168 hours. Lipid Profile: No results for input(s): CHOL, HDL, LDLCALC, TRIG, CHOLHDL, LDLDIRECT in the last 72 hours. Thyroid Function Tests: No results for input(s): TSH, T4TOTAL,  FREET4, T3FREE, THYROIDAB in the last 72 hours. Anemia Panel: No results for input(s): VITAMINB12, FOLATE, FERRITIN, TIBC, IRON, RETICCTPCT in the last 72 hours. Sepsis Labs: No results for input(s): PROCALCITON, LATICACIDVEN in the last 168 hours.  Recent Results (from the past 240 hour(s))  SARS CORONAVIRUS 2 (TAT 6-24 HRS) Nasopharyngeal Nasopharyngeal Swab     Status: None   Collection Time: 01/11/21  1:26 PM   Specimen: Nasopharyngeal Swab  Result Value Ref Range Status   SARS Coronavirus 2 NEGATIVE NEGATIVE Final    Comment: (NOTE) SARS-CoV-2 target nucleic acids are NOT DETECTED.  The SARS-CoV-2 RNA is generally detectable in upper and lower respiratory specimens during the acute phase of infection. Negative results do not preclude SARS-CoV-2 infection, do not rule out co-infections with other pathogens, and should not be used as the sole basis for treatment or other patient management decisions. Negative results must be combined with clinical observations, patient history, and epidemiological information. The expected result is Negative.  Fact Sheet for Patients: HairSlick.no  Fact Sheet for Healthcare Providers: quierodirigir.com  This test is not yet approved or cleared by the [place] FDA and  has been authorized for detection and/or diagnosis  of SARS-CoV-2 by FDA under an Emergency Use Authorization (EUA). This EUA will remain  in effect (meaning this test can be used) for the duration of the COVID-19 declaration under Se ction 564(b)(1) of the Act, 21 U.S.C. section 360bbb-3(b)(1), unless the authorization is terminated or revoked sooner.  Performed at [facility], [address] [place], [place] [address]      RN Pressure Injury Documentation:     Estimated body mass index is 20.9 kg/m as calculated from the following:   Height as of this encounter: 5\' 9"  (1.753 m).   Weight as of this encounter: 64.2 kg.  Malnutrition Type:  Nutrition Problem: Severe Malnutrition Etiology: chronic illness (chronic alcoholism)  Malnutrition Characteristics:  Signs/Symptoms: severe fat depletion,severe muscle depletion  Nutrition Interventions:  Interventions: Ensure Enlive (each supplement provides 350kcal and 20 grams of protein),MVI  Radiology Studies: No results found.  Scheduled Meds: . enoxaparin (LOVENOX) injection  40 mg Subcutaneous Q24H  . feeding supplement  237 mL Oral TID BM  . folic acid  1 mg Oral Daily  . multivitamin with minerals  1 tablet Oral Daily  . polyethylene glycol  17 g Oral Daily  . QUEtiapine  25 mg Oral BID  . sodium chloride flush  3 mL Intravenous Q12H  . thiamine  100 mg Oral Daily   Or  . thiamine  100 mg Intravenous Daily   Continuous Infusions:   LOS: 19 days   , DO [facility] PAGER is on AMION  If 7PM-7AM, please contact night-coverage [email or web address]

## 2021-01-16 NOTE — Progress Notes (Signed)
Nutrition Follow-up  DOCUMENTATION CODES:   Severe malnutrition in context of chronic illness,Underweight  INTERVENTION:   -Ensure Enlive poTID, each supplement provides 350 kcal and 20 grams of protein  -Multivitamin with minerals daily  -RD to sign off, pt eating well. No other interventions to add. -Please consult if additional needs identified.   NUTRITION DIAGNOSIS:   Severe Malnutrition related to chronic illness (chronic alcoholism) as evidenced by severe fat depletion,severe muscle depletion. Ongoing.  GOAL:   Patient will meet greater than or equal to 90% of their needs  Progressing.  MONITOR:   PO intake,Supplement acceptance,Labs,Weight trends,I & O's  ASSESSMENT:   60 y.o. male with PMH significant for chronic alcoholism.    3/9, patient was trying to enter the home that he was evicted from.  Homeowners called [place] [employer] found him frail and disoriented.  EMS was called in. EMS called patient's family who stated that patient had concerns for chronic alcoholism and developing memory issues for last 1 to 2 years. Admitted for acute metabolic encephalopathy.  Patient currently alert/oriented x 2.  Consuming 100% of meals at this time. Accepting Ensure supplements. Per chart review, plan is for SNF at discharge.  Admission weight:  123 lbs Current weight: 141 lbs  Medications: Folic acid, Multivitamin with minerals daily, Miralax, Thiamine  Labs reviewed: Elevated Phos (5.0)  Diet Order:   Diet Order            Diet heart healthy/carb modified Room service appropriate? Yes; Fluid consistency: Thin  Diet effective now                 EDUCATION NEEDS:   No education needs have been identified at this time  Skin:  Skin Assessment: Reviewed RN Assessment  Last BM:  3/20  Height:   Ht Readings from Last 1 Encounters:  12/27/20 5\' 9"  (1.753 m)    Weight:   Wt Readings from Last 1 Encounters:  01/15/21 64.2 kg   BMI:  Body mass index  is 20.9 kg/m.  Estimated Nutritional Needs:   Kcal:  2150-2350  Protein:  85-105g  Fluid:  2.1L/day  09-28-1990, MS, RD, LDN Inpatient Clinical Dietitian Contact information available via Amion

## 2021-01-16 NOTE — TOC Progression Note (Signed)
Transition of Care [facility]) - Progression Note    Patient Details  Name: Alvin MARSIGLIA MRN: [id number] Date of Birth: 1961/08/17  Transition of Care [facility] CM/SW Contact  Darleene Cleaver, [place] Phone Number: 01/16/2021, 1:23 PM  Clinical Narrative:     CSW contacted [facility] to see if they can review patient, and also contacted [facility].  Waiting for a decision.  CSW to continue to follow patient's progress throughout discharge planning.  Expected Discharge Plan: Skilled Nursing Facility Barriers to Discharge: Financial Resources,Homeless with medical needs,Continued Medical Work up,No SNF bed,Requiring sitter/restraints,SNF Pending payor source - LOG,SNF Pending Medicaid,Family Issues  Expected Discharge Plan and Services Expected Discharge Plan: Skilled Nursing Facility In-house Referral: Clinical Social Work,Financial Counselor     Living arrangements for the past 2 months: No permanent address                                       Social Determinants of Health (SDOH) Interventions    Readmission Risk Interventions No flowsheet data found.

## 2021-01-16 NOTE — Progress Notes (Signed)
Physical Therapy Treatment Patient Details Name: MONTAE STAGER MRN: [id number] DOB: [birth year] Today's Date: 01/16/2021    History of Present Illness Pt admitted on 12/27/20 with Acute metabolic encephalopathy, Chronic evolving memory issues, Chronic alcoholism. He had a fall out of bed on 3/11.    PT Comments    Pt ambulated 400' with RW with min guard assist for safety and frequent verbal cues for positioning in RW. Pt has decreased safety awareness.   Follow Up Recommendations  Supervision for mobility/OOB     Equipment Recommendations  None recommended by PT;Rolling walker with 5" wheels    Recommendations for Other Services       Precautions / Restrictions Precautions Precautions: Fall Precaution Comments: incontinent Restrictions Weight Bearing Restrictions: No    Mobility  Bed Mobility Overal bed mobility: Modified Independent Bed Mobility: Supine to Sit     Supine to sit: Modified independent (Device/Increase time)     General bed mobility comments: used rail    Transfers Overall transfer level: Needs assistance Equipment used: Rolling walker (2 wheeled) Transfers: Sit to/from Stand Sit to Stand: Min guard         General transfer comment: steady assist to rise VC's safety 2* impulsivity  Ambulation/Gait Ambulation/Gait assistance: Min guard Gait Distance (Feet): 400 Feet Assistive device: Rolling walker (2 wheeled) Gait Pattern/deviations: Decreased stride length;Shuffle;Trunk flexed;Scissoring;Narrow base of support Gait velocity: WFL   General Gait Details: VCs for positioning in RW as pt frequently stepped outside of frame of RW with LLE while RLE was inside frame   Stairs             Wheelchair Mobility    Modified Rankin (Stroke Patients Only)       Balance Overall balance assessment: Needs assistance;History of Falls Sitting-balance support: Feet supported Sitting balance-Leahy Scale: Good     Standing balance support:  Bilateral upper extremity supported;No upper extremity supported Standing balance-Leahy Scale: Fair                              Cognition Arousal/Alertness: Awake/alert Behavior During Therapy: WFL for tasks assessed/performed Overall Cognitive Status: No family/caregiver present to determine baseline cognitive functioning Area of Impairment: Safety/judgement;Problem solving                         Safety/Judgement: Decreased awareness of safety   Problem Solving: Requires verbal cues        Exercises      General Comments        Pertinent Vitals/Pain Pain Assessment: No/denies pain    Home Living                      Prior Function            PT Goals (current goals can now be found in the care plan section) Acute Rehab PT Goals Patient Stated Goal: Agreeable to work with PT PT Goal Formulation: With patient Time For Goal Achievement: 01/13/21 Potential to Achieve Goals: Good Progress towards PT goals: Progressing toward goals    Frequency    Min 2X/week      PT Plan Current plan remains appropriate    Co-evaluation              AM-PAC PT "6 Clicks" Mobility   Outcome Measure  Help needed turning from your back to your side while in a flat bed without using bedrails?:  None Help needed moving from lying on your back to sitting on the side of a flat bed without using bedrails?: None Help needed moving to and from a bed to a chair (including a wheelchair)?: A Little Help needed standing up from a chair using your arms (e.g., wheelchair or bedside chair)?: A Little Help needed to walk in hospital room?: A Little Help needed climbing 3-5 steps with a railing? : A Little 6 Click Score: 20    End of Session Equipment Utilized During Treatment: Gait belt Activity Tolerance: Patient tolerated treatment well Patient left: in chair;with call bell/phone within reach;with chair alarm set Nurse Communication: Mobility  status PT Visit Diagnosis: Unsteadiness on feet (R26.81);Other abnormalities of gait and mobility (R26.89);History of falling (Z91.81)     Time: 5885-0277 PT Time Calculation (min) (ACUTE ONLY): 10 min  Charges:  $Gait Training: 8-22 mins                     Ralene Bathe Kistler PT 01/16/2021  Acute Rehabilitation Services Pager [phone] Office [phone]

## 2021-01-17 DIAGNOSIS — F1020 Alcohol dependence, uncomplicated: Secondary | ICD-10-CM

## 2021-01-17 LAB — CBC WITH DIFFERENTIAL/PLATELET
Abs Immature Granulocytes: 0.11 10*3/uL — ABNORMAL HIGH (ref 0.00–0.07)
Basophils Absolute: 0.1 10*3/uL (ref 0.0–0.1)
Basophils Relative: 1 %
Eosinophils Absolute: 0.8 10*3/uL — ABNORMAL HIGH (ref 0.0–0.5)
Eosinophils Relative: 9 %
HCT: 36.1 % — ABNORMAL LOW (ref 39.0–52.0)
Hemoglobin: 11.4 g/dL — ABNORMAL LOW (ref 13.0–17.0)
Immature Granulocytes: 1 %
Lymphocytes Relative: 34 %
Lymphs Abs: 3.1 10*3/uL (ref 0.7–4.0)
MCH: 32.6 pg (ref 26.0–34.0)
MCHC: 31.6 g/dL (ref 30.0–36.0)
MCV: 103.1 fL — ABNORMAL HIGH (ref 80.0–100.0)
Monocytes Absolute: 0.9 10*3/uL (ref 0.1–1.0)
Monocytes Relative: 10 %
Neutro Abs: 4.2 10*3/uL (ref 1.7–7.7)
Neutrophils Relative %: 45 %
Platelets: 267 10*3/uL (ref 150–400)
RBC: 3.5 MIL/uL — ABNORMAL LOW (ref 4.22–5.81)
RDW: 15.4 % (ref 11.5–15.5)
WBC: 9.2 10*3/uL (ref 4.0–10.5)
nRBC: 0 % (ref 0.0–0.2)

## 2021-01-17 LAB — COMPREHENSIVE METABOLIC PANEL
ALT: 63 U/L — ABNORMAL HIGH (ref 0–44)
AST: 74 U/L — ABNORMAL HIGH (ref 15–41)
Albumin: 3.5 g/dL (ref 3.5–5.0)
Alkaline Phosphatase: 92 U/L (ref 38–126)
Anion gap: 9 (ref 5–15)
BUN: 22 mg/dL — ABNORMAL HIGH (ref 6–20)
CO2: 25 mmol/L (ref 22–32)
Calcium: 10 mg/dL (ref 8.9–10.3)
Chloride: 103 mmol/L (ref 98–111)
Creatinine, Ser: 0.84 mg/dL (ref 0.61–1.24)
GFR, Estimated: >60 mL/min (ref >60)
Glucose, Bld: 97 mg/dL (ref 70–99)
Potassium: 4.5 mmol/L (ref 3.5–5.1)
Sodium: 137 mmol/L (ref 135–145)
Total Bilirubin: 0.7 mg/dL (ref 0.3–1.2)
Total Protein: 7.8 g/dL (ref 6.5–8.1)

## 2021-01-17 LAB — MAGNESIUM: Magnesium: 1.9 mg/dL (ref 1.7–2.4)

## 2021-01-17 LAB — PHOSPHORUS: Phosphorus: 4.8 mg/dL — ABNORMAL HIGH (ref 2.5–4.6)

## 2021-01-17 NOTE — Progress Notes (Signed)
PROGRESS NOTE  DELROY ORDWAY SPQ:[id number] DOB: May 23, 1961   PCP: Patient, No Pcp Per (Inactive)  Patient is from: homeless  DOA: 12/27/2020 LOS: 20  Chief complaints: Altered mental status  Brief Narrative / Interim history: Old M with PMH of alcohol abuse and related cognitive decline brought to ED by EMS with altered mental status.  Reportedly trying to enter home that he was evicted from on 3/9, and homeowners called police when they found him disoriented and called EMS.  Patient was admitted for acute metabolic encephalopathy, AKI and significant electrolyte arrangements.  Encephalopathy work-up unrevealing.  Continues to require safety sitter due to intermittent impulsiveness.   Subjective: Seen and examined earlier this morning.  No major events overnight of this morning.  Safety sitter at bedside.  No complaints but not a great historian.  He is oriented to self and person only.  Has no insight into why he was brought to the hospital.  Objective: Vitals:   01/15/21 2050 01/16/21 0537 01/16/21 2025 01/17/21 0607  BP: 133/90 125/89 124/88 115/77  Pulse: 89 93 95 (!) 101  Resp: 18 18 15 18   Temp: 98 F (36.7 C) 97.7 F (36.5 C) 98.3 F (36.8 C) 98.5 F (36.9 C)  TempSrc: Oral Oral Oral   SpO2: 98% 100% 97% 97%  Weight:      Height:        Intake/Output Summary (Last 24 hours) at 01/17/2021 1519 Last data filed at 01/17/2021 1222 Gross per 24 hour  Intake 827 ml  Output --  Net 827 ml   Filed Weights   01/08/21 0500 01/11/21 0500 01/15/21 0635  Weight: 64.6 kg 64.6 kg 64.2 kg    Examination:  GENERAL: No apparent distress.  Nontoxic. HEENT: MMM.  Vision and hearing grossly intact.  NECK: Supple.  No apparent JVD.  RESP: On RA.  No IWOB.  Fair aeration bilaterally. CVS:  RRR. Heart sounds normal.  ABD/GI/GU: BS+. Abd soft, NTND.  MSK/EXT:  Moves extremities. No apparent deformity. No edema.  SKIN: no apparent skin lesion or wound NEURO: Wake and alert.   Oriented to self and place.  No apparent focal neuro deficit. PSYCH: Calm. Normal affect.  Procedures:  None  Microbiology summarized: COVID-19 and influenza PCR nonreactive.  Assessment & Plan: Acute metabolic encephalopathy in patient with gradually evolving cognitive impairment -Likely due to alcohol abuse and malnutrition.  Basic encephalopathy work-up including CT head, UDS, TSH and B12 unrevealing. -Check thiamine and RPR -Continue safety sitter -Continue Seroquel 25 mg twice daily with as needed Haldol -Check 12-lead EKG for QTC monitoring -Optimize K and Mg   Constipation Continue Bowel Regimen with Miralax 17 g po Daily -If Necessary will place on Senna-Docusate 1 tab po BID and consider PRN Bisacodyl Suppositories   AKI metabolic acidosis: Resolved. Recent Labs    12/29/20 0359 12/30/20 0437 12/31/20 0430 01/01/21 0331 01/03/21 0348 01/08/21 0321 01/10/21 0349 01/12/21 0425 01/15/21 0336 01/17/21 0357  BUN 27* 13 9 8 8 17 14 17  21* 22*  CREATININE 1.12 0.88 0.88 0.94 0.88 0.87 0.88 0.79 1.00 0.84   Mild elevated LFT: Due to EtOH abuse? -Hepatitis panel in the morning  Macrocytic Anemia, stable.  Anemia panel normal. Recent Labs    12/28/20 1158 12/29/20 0359 12/30/20 0437 12/31/20 0430 01/03/21 0348 01/08/21 0321 01/10/21 0349 01/12/21 0425 01/15/21 0336 01/17/21 0357  HGB 15.7 11.9* 11.1* 10.3* 10.2* 9.6* 10.1* 10.8* 10.6* 11.4*   Chronic EtOH Abuse: Outside the window for withdrawal symptoms. -  Continue multivitamins, folic acid and thiamine  Hypokalemia/hypomagnesemia/hypophosphatemia: Refeeding syndrome?  Resolved.  Impaired Mobility/debility/frequent fall -Therapy recommended SNF.  Severe Protein Calorie Malnutrition: As evidenced by significant muscle mass and subcu fat loss. Body mass index is 20.9 kg/m. Nutrition Problem: Severe Malnutrition Etiology: chronic illness (chronic alcoholism) Signs/Symptoms: severe fat  depletion,severe muscle depletion Interventions: Ensure Enlive (each supplement provides 350kcal and 20 grams of protein),MVI   DVT prophylaxis:  enoxaparin (LOVENOX) injection 40 mg Start: 12/27/20 2200  Code Status: Full code  family Communication: Patient and/or RN. Available if any question.  Level of care: Telemetry Status is: Inpatient  Remains inpatient appropriate because:Unsafe d/c plan   Dispo: The patient is from: Homeless              Anticipated d/c is to: SNF              Patient currently is medically stable to d/c.   Difficult to place patient Yes       Consultants:  None   Sch Meds:  Scheduled Meds: . enoxaparin (LOVENOX) injection  40 mg Subcutaneous Q24H  . feeding supplement  237 mL Oral TID BM  . folic acid  1 mg Oral Daily  . multivitamin with minerals  1 tablet Oral Daily  . polyethylene glycol  17 g Oral Daily  . QUEtiapine  25 mg Oral BID  . sodium chloride flush  3 mL Intravenous Q12H  . thiamine  100 mg Oral Daily   Or  . thiamine  100 mg Intravenous Daily   Continuous Infusions: PRN Meds:.acetaminophen, haloperidol lactate  Antimicrobials: Anti-infectives (From admission, onward)   None       I have personally reviewed the following labs and images: CBC: Recent Labs  Lab 01/12/21 0425 01/15/21 0336 01/17/21 0357  WBC 9.2 9.8 9.2  NEUTROABS  --   --  4.2  HGB 10.8* 10.6* 11.4*  HCT 34.0* 33.8* 36.1*  MCV 103.0* 100.9* 103.1*  PLT 222 256 267   BMP &GFR Recent Labs  Lab 01/12/21 0425 01/15/21 0336 01/17/21 0357  NA 138 139 137  K 4.4 4.1 4.5  CL 107 103 103  CO2 20* 27 25  GLUCOSE 88 92 97  BUN 17 21* 22*  CREATININE 0.79 1.00 0.84  CALCIUM 9.8 9.9 10.0  MG 1.9 1.8 1.9  PHOS 5.2* 5.0* 4.8*   Estimated Creatinine Clearance: 86 mL/min (by C-G formula based on SCr of 0.84 mg/dL). Liver & Pancreas: Recent Labs  Lab 01/12/21 0425 01/15/21 0336 01/17/21 0357  AST 74* 74* 74*  ALT 54* 59* 63*  ALKPHOS 93 94  92  BILITOT 0.7 0.7 0.7  PROT 7.6 7.7 7.8  ALBUMIN 3.6 3.4* 3.5   No results for input(s): LIPASE, AMYLASE in the last 168 hours. No results for input(s): AMMONIA in the last 168 hours. Diabetic: No results for input(s): HGBA1C in the last 72 hours. No results for input(s): GLUCAP in the last 168 hours. Cardiac Enzymes: No results for input(s): CKTOTAL, CKMB, CKMBINDEX, TROPONINI in the last 168 hours. No results for input(s): PROBNP in the last 8760 hours. Coagulation Profile: No results for input(s): INR, PROTIME in the last 168 hours. Thyroid Function Tests: No results for input(s): TSH, T4TOTAL, FREET4, T3FREE, THYROIDAB in the last 72 hours. Lipid Profile: No results for input(s): CHOL, HDL, LDLCALC, TRIG, CHOLHDL, LDLDIRECT in the last 72 hours. Anemia Panel: No results for input(s): VITAMINB12, FOLATE, FERRITIN, TIBC, IRON, RETICCTPCT in the last 72 hours. Urine analysis:  Component Value Date/Time   COLORURINE YELLOW 12/28/2020 0000   APPEARANCEUR HAZY (A) 12/28/2020 0000   LABSPEC 1.025 12/28/2020 0000   PHURINE 5.0 12/28/2020 0000   GLUCOSEU NEGATIVE 12/28/2020 0000   HGBUR NEGATIVE 12/28/2020 0000   BILIRUBINUR NEGATIVE 12/28/2020 0000   KETONESUR NEGATIVE 12/28/2020 0000   PROTEINUR NEGATIVE 12/28/2020 0000   UROBILINOGEN 1.0 05/31/2014 1841   NITRITE NEGATIVE 12/28/2020 0000   LEUKOCYTESUR NEGATIVE 12/28/2020 0000   Sepsis Labs: Invalid input(s): PROCALCITONIN, LACTICIDVEN  Microbiology: Recent Results (from the past 240 hour(s))  SARS CORONAVIRUS 2 (TAT 6-24 HRS) Nasopharyngeal Nasopharyngeal Swab     Status: None   Collection Time: 01/11/21  1:26 PM   Specimen: Nasopharyngeal Swab  Result Value Ref Range Status   SARS Coronavirus 2 NEGATIVE NEGATIVE Final    Comment: (NOTE) SARS-CoV-2 target nucleic acids are NOT DETECTED.  The SARS-CoV-2 RNA is generally detectable in upper and lower respiratory specimens during the acute phase of infection.  Negative results do not preclude SARS-CoV-2 infection, do not rule out co-infections with other pathogens, and should not be used as the sole basis for treatment or other patient management decisions. Negative results must be combined with clinical observations, patient history, and epidemiological information. The expected result is Negative.  Fact Sheet for Patients: HairSlick.no  Fact Sheet for Healthcare Providers: quierodirigir.com  This test is not yet approved or cleared by the [place] FDA and  has been authorized for detection and/or diagnosis of SARS-CoV-2 by FDA under an Emergency Use Authorization (EUA). This EUA will remain  in effect (meaning this test can be used) for the duration of the COVID-19 declaration under Se ction 564(b)(1) of the Act, 21 U.S.C. section 360bbb-3(b)(1), unless the authorization is terminated or revoked sooner.  Performed at [facility], [address], [place], [place] [address]     Radiology Studies: No results found.    Kinzlie Harney T. Aloha Bartok [facility]  If 7PM-7AM, please contact night-coverage [email or web address] 01/17/2021, 3:19 PM

## 2021-01-17 NOTE — TOC Progression Note (Addendum)
Transition of Care [facility]) - Progression Note    Patient Details  Name: Alvin Clark MRN: [id number] Date of Birth: 1961/01/02  Transition of Care [facility]) CM/SW Contact  Halford Chessman Phone Number: 01/17/2021, 10:18 AM  Clinical Narrative:     CSW received a message from [facility], they can not accept patient.  CSW also sent a message to [facility] again to see if they can accept patient, waiting for a call back.  CSW tried to contact [facility] again, left a message waiting for a call back.  CSW updated patient's sister on situation with trying to find a placement for patient.   Expected Discharge Plan: Skilled Nursing Facility Barriers to Discharge: Financial Resources,Homeless with medical needs,Continued Medical Work up,No SNF bed,Requiring sitter/restraints,SNF Pending payor source - LOG,SNF Pending Medicaid,Family Issues  Expected Discharge Plan and Services Expected Discharge Plan: Skilled Nursing Facility In-house Referral: Clinical Social Work,Financial Counselor     Living arrangements for the past 2 months: No permanent address                                       Social Determinants of Health (SDOH) Interventions    Readmission Risk Interventions No flowsheet data found.

## 2021-01-18 LAB — HEPATITIS PANEL, ACUTE
HCV Ab: NONREACTIVE
Hep A IgM: NONREACTIVE
Hep B C IgM: NONREACTIVE
Hepatitis B Surface Ag: NONREACTIVE

## 2021-01-18 LAB — AMMONIA: Ammonia: 56 umol/L — ABNORMAL HIGH (ref 9–35)

## 2021-01-18 MED ORDER — LACTULOSE 10 GM/15ML PO SOLN
20.0000 g | Freq: Two times a day (BID) | ORAL | Status: DC
  Administered 2021-01-18 – 2021-03-03 (×87): 20 g via ORAL
  Filled 2021-01-18 (×89): qty 30

## 2021-01-18 NOTE — Progress Notes (Signed)
PROGRESS NOTE  WILFRED DAYRIT URK:[id number] DOB: 1961/02/18   PCP: Patient, No Pcp Per (Inactive)  Patient is from: homeless  DOA: 12/27/2020 LOS: 21  Chief complaints: Altered mental status  Brief Narrative / Interim history: Old M with PMH of alcohol abuse and related cognitive decline brought to ED by EMS with altered mental status.  Reportedly trying to enter home that he was evicted from on 3/9, and homeowners called police when they found him disoriented and called EMS.  Patient was admitted for acute metabolic encephalopathy, AKI and significant electrolyte arrangements.  Encephalopathy work-up unrevealing.  Continues to require safety sitter due to intermittent impulsiveness.   Subjective: Seen and examined earlier this morning.  No major events overnight of this morning.  No complaints.  He denies chest pain, dyspnea, GI or UTI symptoms but not a reliable historian.  He is awake and alert but only oriented to self and "hospital".  Safety sitter at bedside.  Objective: Vitals:   01/17/21 0607 01/17/21 2040 01/18/21 0336 01/18/21 0544  BP: 115/77 115/70  122/70  Pulse: (!) 101 97  (!) 107  Resp: 18 17  17   Temp: 98.5 F (36.9 C) 98.3 F (36.8 C)  98 F (36.7 C)  TempSrc:  Oral  Oral  SpO2: 97% 98%  100%  Weight:   65.5 kg   Height:        Intake/Output Summary (Last 24 hours) at 01/18/2021 1438 Last data filed at 01/18/2021 1204 Gross per 24 hour  Intake 1438 ml  Output --  Net 1438 ml   Filed Weights   01/11/21 0500 01/15/21 0635 01/18/21 0336  Weight: 64.6 kg 64.2 kg 65.5 kg    Examination:  GENERAL: No apparent distress.  Nontoxic. HEENT: MMM.  Vision and hearing grossly intact.  NECK: Supple.  No apparent JVD.  RESP: On RA.  No IWOB.  Fair aeration bilaterally. CVS:  RRR. Heart sounds normal.  ABD/GI/GU: BS+. Abd soft, NTND.  MSK/EXT:  Moves extremities. No apparent deformity. No edema.  SKIN: no apparent skin lesion or wound NEURO: Awake and alert.   Oriented to self and place.  No apparent focal neuro deficit. PSYCH: Calm. Normal affect.   Procedures:  None  Microbiology summarized: COVID-19 and influenza PCR nonreactive.  Assessment & Plan: Acute metabolic encephalopathy in patient with gradually evolving cognitive impairment.  Likely due to alcohol abuse and malnutrition.  Basic encephalopathy work-up including CT head, UDS, TSH and B12 unrevealing.  Ammonia elevated to 56. -Start lactulose 20 g twice daily-hold if greater than 2 BMs in 24 hours. -Follow thiamine and RPR -Continue safety sitter -Continue Seroquel 25 mg twice daily with as needed Haldol -Monitor QTC-427 on 3/30 -Optimize K and Mg  AKI metabolic acidosis: Resolved. Recent Labs    12/29/20 0359 12/30/20 0437 12/31/20 0430 01/01/21 0331 01/03/21 0348 01/08/21 0321 01/10/21 0349 01/12/21 0425 01/15/21 0336 01/17/21 0357  BUN 27* 13 9 8 8 17 14 17  21* 22*  CREATININE 1.12 0.88 0.88 0.94 0.88 0.87 0.88 0.79 1.00 0.84   Mild elevated LFT: Due to EtOH abuse?  Acute hepatitis panel negative.  Stable. -Monitor intermittently  Macrocytic Anemia, stable.  Anemia panel normal. Recent Labs    12/28/20 1158 12/29/20 0359 12/30/20 0437 12/31/20 0430 01/03/21 0348 01/08/21 0321 01/10/21 0349 01/12/21 0425 01/15/21 0336 01/17/21 0357  HGB 15.7 11.9* 11.1* 10.3* 10.2* 9.6* 10.1* 10.8* 10.6* 11.4*   Chronic EtOH Abuse: Outside the window for withdrawal symptoms. -Continue multivitamins, folic acid  and thiamine  Hypokalemia/hypomagnesemia/hypophosphatemia: Refeeding syndrome?  Resolved. -Monitor intermittently  Impaired Mobility/debility/frequent fall -Therapy recommended SNF.  Difficult to place due to lack of insurance  Constipation -Now on scheduled lactulose.  Severe malnutrition: As evidenced by significant muscle mass and subcu fat loss. Body mass index is 21.32 kg/m. Nutrition Problem: Severe Malnutrition Etiology: chronic illness  (chronic alcoholism) Signs/Symptoms: severe fat depletion,severe muscle depletion Interventions: Ensure Enlive (each supplement provides 350kcal and 20 grams of protein),MVI   DVT prophylaxis:  enoxaparin (LOVENOX) injection 40 mg Start: 12/27/20 2200  Code Status: Full code  family Communication: Patient and/or RN. Available if any question.  Level of care: Telemetry Status is: Inpatient  Remains inpatient appropriate because:Unsafe d/c plan   Dispo: The patient is from: Homeless              Anticipated d/c is to: SNF              Patient currently is medically stable to d/c.   Difficult to place patient Yes       Consultants:  None   Sch Meds:  Scheduled Meds: . enoxaparin (LOVENOX) injection  40 mg Subcutaneous Q24H  . feeding supplement  237 mL Oral TID BM  . folic acid  1 mg Oral Daily  . lactulose  20 g Oral BID  . multivitamin with minerals  1 tablet Oral Daily  . QUEtiapine  25 mg Oral BID  . sodium chloride flush  3 mL Intravenous Q12H  . thiamine  100 mg Oral Daily   Or  . thiamine  100 mg Intravenous Daily   Continuous Infusions: PRN Meds:.acetaminophen, haloperidol lactate  Antimicrobials: Anti-infectives (From admission, onward)   None       I have personally reviewed the following labs and images: CBC: Recent Labs  Lab 01/12/21 0425 01/15/21 0336 01/17/21 0357  WBC 9.2 9.8 9.2  NEUTROABS  --   --  4.2  HGB 10.8* 10.6* 11.4*  HCT 34.0* 33.8* 36.1*  MCV 103.0* 100.9* 103.1*  PLT 222 256 267   BMP &GFR Recent Labs  Lab 01/12/21 0425 01/15/21 0336 01/17/21 0357  NA 138 139 137  K 4.4 4.1 4.5  CL 107 103 103  CO2 20* 27 25  GLUCOSE 88 92 97  BUN 17 21* 22*  CREATININE 0.79 1.00 0.84  CALCIUM 9.8 9.9 10.0  MG 1.9 1.8 1.9  PHOS 5.2* 5.0* 4.8*   Estimated Creatinine Clearance: 87.7 mL/min (by C-G formula based on SCr of 0.84 mg/dL). Liver & Pancreas: Recent Labs  Lab 01/12/21 0425 01/15/21 0336 01/17/21 0357  AST 74* 74*  74*  ALT 54* 59* 63*  ALKPHOS 93 94 92  BILITOT 0.7 0.7 0.7  PROT 7.6 7.7 7.8  ALBUMIN 3.6 3.4* 3.5   No results for input(s): LIPASE, AMYLASE in the last 168 hours. Recent Labs  Lab 01/18/21 0309  AMMONIA 56*   Diabetic: No results for input(s): HGBA1C in the last 72 hours. No results for input(s): GLUCAP in the last 168 hours. Cardiac Enzymes: No results for input(s): CKTOTAL, CKMB, CKMBINDEX, TROPONINI in the last 168 hours. No results for input(s): PROBNP in the last 8760 hours. Coagulation Profile: No results for input(s): INR, PROTIME in the last 168 hours. Thyroid Function Tests: No results for input(s): TSH, T4TOTAL, FREET4, T3FREE, THYROIDAB in the last 72 hours. Lipid Profile: No results for input(s): CHOL, HDL, LDLCALC, TRIG, CHOLHDL, LDLDIRECT in the last 72 hours. Anemia Panel: No results for input(s): VITAMINB12, FOLATE,  FERRITIN, TIBC, IRON, RETICCTPCT in the last 72 hours. Urine analysis:    Component Value Date/Time   COLORURINE YELLOW 12/28/2020 0000   APPEARANCEUR HAZY (A) 12/28/2020 0000   LABSPEC 1.025 12/28/2020 0000   PHURINE 5.0 12/28/2020 0000   GLUCOSEU NEGATIVE 12/28/2020 0000   HGBUR NEGATIVE 12/28/2020 0000   BILIRUBINUR NEGATIVE 12/28/2020 0000   KETONESUR NEGATIVE 12/28/2020 0000   PROTEINUR NEGATIVE 12/28/2020 0000   UROBILINOGEN 1.0 05/31/2014 1841   NITRITE NEGATIVE 12/28/2020 0000   LEUKOCYTESUR NEGATIVE 12/28/2020 0000   Sepsis Labs: Invalid input(s): PROCALCITONIN, LACTICIDVEN  Microbiology: Recent Results (from the past 240 hour(s))  SARS CORONAVIRUS 2 (TAT 6-24 HRS) Nasopharyngeal Nasopharyngeal Swab     Status: None   Collection Time: 01/11/21  1:26 PM   Specimen: Nasopharyngeal Swab  Result Value Ref Range Status   SARS Coronavirus 2 NEGATIVE NEGATIVE Final    Comment: (NOTE) SARS-CoV-2 target nucleic acids are NOT DETECTED.  The SARS-CoV-2 RNA is generally detectable in upper and lower respiratory specimens during the  acute phase of infection. Negative results do not preclude SARS-CoV-2 infection, do not rule out co-infections with other pathogens, and should not be used as the sole basis for treatment or other patient management decisions. Negative results must be combined with clinical observations, patient history, and epidemiological information. The expected result is Negative.  Fact Sheet for Patients: HairSlick.no  Fact Sheet for Healthcare Providers: quierodirigir.com  This test is not yet approved or cleared by the [place] FDA and  has been authorized for detection and/or diagnosis of SARS-CoV-2 by FDA under an Emergency Use Authorization (EUA). This EUA will remain  in effect (meaning this test can be used) for the duration of the COVID-19 declaration under Se ction 564(b)(1) of the Act, 21 U.S.C. section 360bbb-3(b)(1), unless the authorization is terminated or revoked sooner.  Performed at [facility], [address], [place], [place] [address]     Radiology Studies: No results found.    Quinzell Malcomb T. Kimberla Driskill [facility]  If 7PM-7AM, please contact night-coverage [email or web address] 01/18/2021, 2:38 PM

## 2021-01-18 NOTE — Progress Notes (Signed)
Physical Therapy Treatment Patient Details Name: Alvin Clark MRN: [id number] DOB: [birth year] Today's Date: 01/18/2021    History of Present Illness Pt admitted on 12/27/20 with Acute metabolic encephalopathy, Chronic evolving memory issues due to Chronic alcoholism. He had a fall out of bed on 3/11. Pt remains hospitalized due to homeless and difficult to place situation.    PT Comments    Pt demonstrates good strength and ability to move - limited due to safety, fall risk, impulsivity.  Required frequent cues for safety and min A at times for balance.  Pt with guarded rehab potential and would likely benefit from long term placement in the future.     Follow Up Recommendations  Supervision/Assistance - 24 hour;SNF (long term placement)     Equipment Recommendations  Rolling walker with 5" wheels    Recommendations for Other Services       Precautions / Restrictions Precautions Precautions: Fall Restrictions Weight Bearing Restrictions: No    Mobility  Bed Mobility Overal bed mobility: Modified Independent Bed Mobility: Supine to Sit;Sit to Supine     Supine to sit: Modified independent (Device/Increase time);HOB elevated Sit to supine: Modified independent (Device/Increase time);HOB elevated        Transfers Overall transfer level: Needs assistance Equipment used: Rolling walker (2 wheeled);None Transfers: Sit to/from Stand Sit to Stand: Min guard         General transfer comment: Performed x 4 with min guard for safety due to impulsivity and to steady.  Did better with RW  Ambulation/Gait Ambulation/Gait assistance: Min guard;Min assist Gait Distance (Feet): 600 Feet Assistive device: Rolling walker (2 wheeled);None Gait Pattern/deviations: Step-through pattern;Narrow base of support;Staggering left;Staggering right;Scissoring Gait velocity: fast/impulsive   General Gait Details: Initially began ambulation without RW but pt with significant forward momentum  requiring at least 6 episodes of min A for LOB within 300'.  Returned to room and added RW.  Then ambulated 300' more with min guard and only 1 episode of LOB.  Frequent cues for controlled speed, upright posture, and RW use.   Stairs Stairs: Yes Stairs assistance: Min assist Stair Management: One rail Right;Step to pattern;Alternating pattern Number of Stairs: 10 General stair comments: Pt unsteady at times requiring min A for balance.  Sometimes he would do step to and other alternating - cued for step to for safety.  Pt also getting feet closer to edge of steps each step - cued for positioning   Wheelchair Mobility    Modified Rankin (Stroke Patients Only)       Balance Overall balance assessment: Needs assistance;History of Falls Sitting-balance support: Feet supported Sitting balance-Leahy Scale: Good     Standing balance support: Bilateral upper extremity supported;No upper extremity supported Standing balance-Leahy Scale: Fair Standing balance comment: More steady with use of RW.  Without RW for ambulation pt with forward momentum.  Pt performed transfers and reaching for items in bed - again increased forward momentum requiring min A                            Cognition Arousal/Alertness: Awake/alert Behavior During Therapy: Impulsive Overall Cognitive Status: No family/caregiver present to determine baseline cognitive functioning Area of Impairment: Safety/judgement;Problem solving                         Safety/Judgement: Decreased awareness of safety;Decreased awareness of deficits   Problem Solving: Requires verbal cues  Exercises      General Comments        Pertinent Vitals/Pain Pain Assessment: No/denies pain    Home Living                      Prior Function            PT Goals (current goals can now be found in the care plan section) Acute Rehab PT Goals Patient Stated Goal: Agreeable to work with PT PT  Goal Formulation: With patient Time For Goal Achievement: 01/26/21 Potential to Achieve Goals: Good Progress towards PT goals: Progressing toward goals    Frequency    Min 2X/week      PT Plan Current plan remains appropriate    Co-evaluation              AM-PAC PT "6 Clicks" Mobility   Outcome Measure  Help needed turning from your back to your side while in a flat bed without using bedrails?: None Help needed moving from lying on your back to sitting on the side of a flat bed without using bedrails?: None Help needed moving to and from a bed to a chair (including a wheelchair)?: A Little Help needed standing up from a chair using your arms (e.g., wheelchair or bedside chair)?: A Little Help needed to walk in hospital room?: A Little Help needed climbing 3-5 steps with a railing? : A Little 6 Click Score: 20    End of Session Equipment Utilized During Treatment: Gait belt Activity Tolerance: Patient tolerated treatment well Patient left: in bed;with call bell/phone within reach;with nursing/sitter in room;with bed alarm set Nurse Communication: Mobility status PT Visit Diagnosis: Unsteadiness on feet (R26.81);Other abnormalities of gait and mobility (R26.89);History of falling (Z91.81)     Time: 1655-1710 PT Time Calculation (min) (ACUTE ONLY): 15 min  Charges:  $Gait Training: 8-22 mins                     Anise Salvo, PT Acute Rehab Services Pager [phone] [facility] [phone]     Rayetta Humphrey 01/18/2021, 5:47 PM

## 2021-01-19 LAB — COMPREHENSIVE METABOLIC PANEL
ALT: 52 U/L — ABNORMAL HIGH (ref 0–44)
AST: 58 U/L — ABNORMAL HIGH (ref 15–41)
Albumin: 3.6 g/dL (ref 3.5–5.0)
Alkaline Phosphatase: 93 U/L (ref 38–126)
Anion gap: 9 (ref 5–15)
BUN: 15 mg/dL (ref 6–20)
CO2: 24 mmol/L (ref 22–32)
Calcium: 9.9 mg/dL (ref 8.9–10.3)
Chloride: 106 mmol/L (ref 98–111)
Creatinine, Ser: 0.81 mg/dL (ref 0.61–1.24)
GFR, Estimated: >60 mL/min (ref >60)
Glucose, Bld: 97 mg/dL (ref 70–99)
Potassium: 4.2 mmol/L (ref 3.5–5.1)
Sodium: 139 mmol/L (ref 135–145)
Total Bilirubin: 0.7 mg/dL (ref 0.3–1.2)
Total Protein: 7.9 g/dL (ref 6.5–8.1)

## 2021-01-19 LAB — CBC
HCT: 34.9 % — ABNORMAL LOW (ref 39.0–52.0)
Hemoglobin: 11 g/dL — ABNORMAL LOW (ref 13.0–17.0)
MCH: 32.5 pg (ref 26.0–34.0)
MCHC: 31.5 g/dL (ref 30.0–36.0)
MCV: 103.3 fL — ABNORMAL HIGH (ref 80.0–100.0)
Platelets: 254 10*3/uL (ref 150–400)
RBC: 3.38 MIL/uL — ABNORMAL LOW (ref 4.22–5.81)
RDW: 15.4 % (ref 11.5–15.5)
WBC: 9.2 10*3/uL (ref 4.0–10.5)
nRBC: 0 % (ref 0.0–0.2)

## 2021-01-19 LAB — MAGNESIUM: Magnesium: 1.8 mg/dL (ref 1.7–2.4)

## 2021-01-19 LAB — AMMONIA: Ammonia: 16 umol/L (ref 9–35)

## 2021-01-19 LAB — PHOSPHORUS: Phosphorus: 4.8 mg/dL — ABNORMAL HIGH (ref 2.5–4.6)

## 2021-01-19 MED ORDER — MAGNESIUM SULFATE 2 GM/50ML IV SOLN
2.0000 g | Freq: Once | INTRAVENOUS | Status: AC
  Administered 2021-01-19: 2 g via INTRAVENOUS
  Filled 2021-01-19: qty 50

## 2021-01-19 NOTE — Progress Notes (Signed)
PROGRESS NOTE  Alvin Clark TDV:[id number] DOB: September 08, 1961   PCP: Patient, No Pcp Per (Inactive)  Patient is from: homeless  DOA: 12/27/2020 LOS: 22  Chief complaints: Altered mental status  Brief Narrative / Interim history: Old M with PMH of alcohol abuse and related cognitive decline brought to ED by EMS with altered mental status.  Reportedly trying to enter home that he was evicted from on 3/9, and homeowners called police when they found him disoriented and called EMS.  Patient was admitted for acute metabolic encephalopathy, AKI and significant electrolyte arrangements.  Encephalopathy work-up unrevealing.  Continues to require safety sitter due to high risk of fall as he tends to jump out of the bed unexpectedly.  Subjective: Seen and examined earlier this morning.  No major events overnight or this morning.  No complaints but not a great historian.  He denies chest pain, dyspnea, GI or UTI symptoms.  Objective: Vitals:   01/18/21 1727 01/18/21 2104 01/19/21 0353 01/19/21 0401  BP: (!) 119/93 123/86 120/85   Pulse: (!) 110 (!) 107 95   Resp: 18 16 16    Temp: 98.6 F (37 C) 98.8 F (37.1 C) 98.6 F (37 C)   TempSrc: Oral Oral Oral   SpO2: 98% 99% 100%   Weight:    67.3 kg  Height:        Intake/Output Summary (Last 24 hours) at 01/19/2021 1238 Last data filed at 01/19/2021 1237 Gross per 24 hour  Intake 720 ml  Output --  Net 720 ml   Filed Weights   01/15/21 0635 01/18/21 0336 01/19/21 0401  Weight: 64.2 kg 65.5 kg 67.3 kg    Examination:  GENERAL: No apparent distress.  Nontoxic. HEENT: MMM.  Vision and hearing grossly intact.  NECK: Supple.  No apparent JVD.  RESP: On RA.  No IWOB.  Fair aeration bilaterally. CVS:  RRR. Heart sounds normal.  ABD/GI/GU: BS+. Abd soft, NTND.  MSK/EXT:  Moves extremities.  Significant muscle mass and subcu fat loss. SKIN: no apparent skin lesion or wound NEURO: Awake and alert.  Oriented to self and place.  No apparent focal  neuro deficit. PSYCH: Calm. Normal affect.  Procedures:  None  Microbiology summarized: COVID-19 and influenza PCR nonreactive.  Assessment & Plan: Acute metabolic encephalopathy in patient with gradually evolving cognitive impairment.  Likely due to alcohol abuse and malnutrition.  Basic encephalopathy work-up including CT head, UDS, TSH, and B12 unrevealing.  Ammonia elevated to 56>> 16. -Start lactulose 20 g twice daily-hold if greater than 2 BMs in 24 hours. -Follow thiamine and RPR -Continue safety sitter given high risk for fall -Continue Seroquel 25 mg twice daily with as needed Haldol -Monitor QTC-427 on 3/30 -Optimize K and Mg  AKI metabolic acidosis: Resolved. Recent Labs    12/30/20 0437 12/31/20 0430 01/01/21 0331 01/03/21 0348 01/08/21 0321 01/10/21 0349 01/12/21 0425 01/15/21 0336 01/17/21 0357 01/19/21 0320  BUN 13 9 8 8 17 14 17  21* 22* 15  CREATININE 0.88 0.88 0.94 0.88 0.87 0.88 0.79 1.00 0.84 0.81   Mild elevated LFT: due to EtOH abuse?  Acute hepatitis panel negative.  Improving. -Monitor intermittently  Macrocytic Anemia: Stable.  Anemia panel normal. Recent Labs    12/29/20 0359 12/30/20 0437 12/31/20 0430 01/03/21 0348 01/08/21 0321 01/10/21 0349 01/12/21 0425 01/15/21 0336 01/17/21 0357 01/19/21 0320  HGB 11.9* 11.1* 10.3* 10.2* 9.6* 10.1* 10.8* 10.6* 11.4* 11.0*   Chronic EtOH Abuse: Outside the window for withdrawal symptoms. -Continue multivitamins, folic  acid and thiamine  Hypokalemia/hypomagnesemia/hypophosphatemia: Refeeding syndrome?  Resolved. -Monitor intermittently  Impaired Mobility/debility/frequent fall -Therapy recommended SNF.  Difficult to place due to lack of insurance  Constipation -Continue scheduled lactulose  Severe malnutrition: As evidenced by significant muscle mass and subcu fat loss. Body mass index is 21.91 kg/m. Nutrition Problem: Severe Malnutrition Etiology: chronic illness (chronic  alcoholism) Signs/Symptoms: severe fat depletion,severe muscle depletion Interventions: Ensure Enlive (each supplement provides 350kcal and 20 grams of protein),MVI   DVT prophylaxis:  enoxaparin (LOVENOX) injection 40 mg Start: 12/27/20 2200  Code Status: Full code  family Communication: Updated patient's sister over the phone. Level of care: Telemetry Status is: Inpatient  Remains inpatient appropriate because:Unsafe d/c plan   Dispo: The patient is from: Homeless              Anticipated d/c is to: SNF              Patient currently is medically stable to d/c.   Difficult to place patient Yes       Consultants:  None   Sch Meds:  Scheduled Meds: . enoxaparin (LOVENOX) injection  40 mg Subcutaneous Q24H  . feeding supplement  237 mL Oral TID BM  . folic acid  1 mg Oral Daily  . lactulose  20 g Oral BID  . multivitamin with minerals  1 tablet Oral Daily  . QUEtiapine  25 mg Oral BID  . sodium chloride flush  3 mL Intravenous Q12H  . thiamine  100 mg Oral Daily   Or  . thiamine  100 mg Intravenous Daily   Continuous Infusions: PRN Meds:.acetaminophen, haloperidol lactate  Antimicrobials: Anti-infectives (From admission, onward)   None       I have personally reviewed the following labs and images: CBC: Recent Labs  Lab 01/15/21 0336 01/17/21 0357 01/19/21 0320  WBC 9.8 9.2 9.2  NEUTROABS  --  4.2  --   HGB 10.6* 11.4* 11.0*  HCT 33.8* 36.1* 34.9*  MCV 100.9* 103.1* 103.3*  PLT 256 267 254   BMP &GFR Recent Labs  Lab 01/15/21 0336 01/17/21 0357 01/19/21 0320  NA 139 137 139  K 4.1 4.5 4.2  CL 103 103 106  CO2 27 25 24   GLUCOSE 92 97 97  BUN 21* 22* 15  CREATININE 1.00 0.84 0.81  CALCIUM 9.9 10.0 9.9  MG 1.8 1.9 1.8  PHOS 5.0* 4.8* 4.8*   Estimated Creatinine Clearance: 93.5 mL/min (by C-G formula based on SCr of 0.81 mg/dL). Liver & Pancreas: Recent Labs  Lab 01/15/21 0336 01/17/21 0357 01/19/21 0320  AST 74* 74* 58*  ALT 59*  63* 52*  ALKPHOS 94 92 93  BILITOT 0.7 0.7 0.7  PROT 7.7 7.8 7.9  ALBUMIN 3.4* 3.5 3.6   No results for input(s): LIPASE, AMYLASE in the last 168 hours. Recent Labs  Lab 01/18/21 0309  AMMONIA 56*   Diabetic: No results for input(s): HGBA1C in the last 72 hours. No results for input(s): GLUCAP in the last 168 hours. Cardiac Enzymes: No results for input(s): CKTOTAL, CKMB, CKMBINDEX, TROPONINI in the last 168 hours. No results for input(s): PROBNP in the last 8760 hours. Coagulation Profile: No results for input(s): INR, PROTIME in the last 168 hours. Thyroid Function Tests: No results for input(s): TSH, T4TOTAL, FREET4, T3FREE, THYROIDAB in the last 72 hours. Lipid Profile: No results for input(s): CHOL, HDL, LDLCALC, TRIG, CHOLHDL, LDLDIRECT in the last 72 hours. Anemia Panel: No results for input(s): VITAMINB12, FOLATE, FERRITIN, TIBC,  IRON, RETICCTPCT in the last 72 hours. Urine analysis:    Component Value Date/Time   COLORURINE YELLOW 12/28/2020 0000   APPEARANCEUR HAZY (A) 12/28/2020 0000   LABSPEC 1.025 12/28/2020 0000   PHURINE 5.0 12/28/2020 0000   GLUCOSEU NEGATIVE 12/28/2020 0000   HGBUR NEGATIVE 12/28/2020 0000   BILIRUBINUR NEGATIVE 12/28/2020 0000   KETONESUR NEGATIVE 12/28/2020 0000   PROTEINUR NEGATIVE 12/28/2020 0000   UROBILINOGEN 1.0 05/31/2014 1841   NITRITE NEGATIVE 12/28/2020 0000   LEUKOCYTESUR NEGATIVE 12/28/2020 0000   Sepsis Labs: Invalid input(s): PROCALCITONIN, LACTICIDVEN  Microbiology: Recent Results (from the past 240 hour(s))  SARS CORONAVIRUS 2 (TAT 6-24 HRS) Nasopharyngeal Nasopharyngeal Swab     Status: None   Collection Time: 01/11/21  1:26 PM   Specimen: Nasopharyngeal Swab  Result Value Ref Range Status   SARS Coronavirus 2 NEGATIVE NEGATIVE Final    Comment: (NOTE) SARS-CoV-2 target nucleic acids are NOT DETECTED.  The SARS-CoV-2 RNA is generally detectable in upper and lower respiratory specimens during the acute phase of  infection. Negative results do not preclude SARS-CoV-2 infection, do not rule out co-infections with other pathogens, and should not be used as the sole basis for treatment or other patient management decisions. Negative results must be combined with clinical observations, patient history, and epidemiological information. The expected result is Negative.  Fact Sheet for Patients: HairSlick.no  Fact Sheet for Healthcare Providers: quierodirigir.com  This test is not yet approved or cleared by the [place] FDA and  has been authorized for detection and/or diagnosis of SARS-CoV-2 by FDA under an Emergency Use Authorization (EUA). This EUA will remain  in effect (meaning this test can be used) for the duration of the COVID-19 declaration under Se ction 564(b)(1) of the Act, 21 U.S.C. section 360bbb-3(b)(1), unless the authorization is terminated or revoked sooner.  Performed at [facility], [address], [place], [place] [address]     Radiology Studies: No results found.    Caedyn Raygoza T. Jenille Laszlo [facility]  If 7PM-7AM, please contact night-coverage [email or web address] 01/19/2021, 12:38 PM

## 2021-01-20 LAB — RPR: RPR Ser Ql: NONREACTIVE

## 2021-01-20 NOTE — Progress Notes (Signed)
PROGRESS NOTE  Alvin Clark [id number]:[id number] DOB: 01-07-1961   PCP: Patient, No Pcp Per (Inactive)  Patient is from: homeless  DOA: 12/27/2020 LOS: 23  Chief complaints: Altered mental status  Brief Narrative / Interim history: Old M with PMH of alcohol abuse and related cognitive decline brought to ED by EMS with altered mental status.  Reportedly trying to enter home that he was evicted from on 3/9, and homeowners called police when they found him disoriented and called EMS.  Patient was admitted for acute metabolic encephalopathy, AKI and significant electrolyte arrangements.  Encephalopathy work-up unrevealing.  Continues to require safety sitter due to high risk of fall as he tends to jump out of the bed unexpectedly.  Subjective: Seen and examined earlier this morning.  No major events overnight or this morning.  No complaints but not a great historian.  He denies chest pain, dyspnea, GI or UTI symptoms.  Seen and examined earlier this morning.  No major events overnight or this morning.  No complaints but not a reliable historian.  He says he feels hungry after he ate his breakfast.  Wanted to have more.  Objective: Vitals:   01/19/21 0401 01/19/21 1327 01/19/21 2102 01/20/21 0622  BP:  131/89 111/84 105/71  Pulse:  99 95 91  Resp:  19 16 16   Temp:  97.9 F (36.6 C) 98.4 F (36.9 C) 98.5 F (36.9 C)  TempSrc:  Oral Oral Oral  SpO2:  99% 98% 99%  Weight: 67.3 kg     Height:        Intake/Output Summary (Last 24 hours) at 01/20/2021 1304 Last data filed at 01/20/2021 1200 Gross per 24 hour  Intake 1185 ml  Output 0 ml  Net 1185 ml   Filed Weights   01/15/21 0635 01/18/21 0336 01/19/21 0401  Weight: 64.2 kg 65.5 kg 67.3 kg    Examination:  GENERAL: No apparent distress.  Nontoxic. HEENT: MMM.  Vision and hearing grossly intact.  NECK: Supple.  No apparent JVD.  RESP:  No IWOB.  Fair aeration bilaterally. CVS:  RRR. Heart sounds normal.  ABD/GI/GU: BS+. Abd soft,  NTND.  MSK/EXT:  Moves extremities.  Significant muscle mass and subcu fat loss. SKIN: no apparent skin lesion or wound NEURO: Awake and alert.  Oriented to self and hospital but he thinks he is in [place].  No apparent focal neuro deficit. PSYCH: Calm. Normal affect.   Procedures:  None  Microbiology summarized: COVID-19 and influenza PCR nonreactive.  Assessment & Plan: Acute metabolic encephalopathy in patient with gradually evolving cognitive impairment.  Likely due to alcohol abuse and malnutrition.  Basic encephalopathy work-up including CT head, UDS, RPR, TSH, and B12 unrevealing.  Ammonia elevated to 56>> 16. -Start lactulose 20 g twice daily-hold if greater than 2 BMs in 24 hours. -Follow thiamine level. -Continue safety sitter given high risk for fall -Continue Seroquel 25 mg twice daily with as needed Haldol -Monitor QTC-427 on 3/30 -Optimize K and Mg  AKI metabolic acidosis: Resolved. Recent Labs    12/30/20 0437 12/31/20 0430 01/01/21 0331 01/03/21 0348 01/08/21 0321 01/10/21 0349 01/12/21 0425 01/15/21 0336 01/17/21 0357 01/19/21 0320  BUN 13 9 8 8 17 14 17  21* 22* 15  CREATININE 0.88 0.88 0.94 0.88 0.87 0.88 0.79 1.00 0.84 0.81   Mild elevated LFT: due to EtOH abuse?  Acute hepatitis panel negative.  Improving. -Monitor intermittently  Macrocytic Anemia: Stable.  Anemia panel normal. Recent Labs    12/29/20 0359 12/30/20  8338 12/31/20 0430 01/03/21 0348 01/08/21 0321 01/10/21 0349 01/12/21 0425 01/15/21 0336 01/17/21 0357 01/19/21 0320  HGB 11.9* 11.1* 10.3* 10.2* 9.6* 10.1* 10.8* 10.6* 11.4* 11.0*  -Monitor intermittently  Chronic EtOH Abuse: Outside the window for withdrawal symptoms. -Continue multivitamins, folic acid and thiamine  Hypokalemia/hypomagnesemia/hypophosphatemia: Refeeding syndrome?  Resolved. -Monitor intermittently  Impaired Mobility/debility/frequent fall -Therapy recommended SNF.  Difficult to place due to lack of  insurance  Constipation -Continue scheduled lactulose  Severe malnutrition: As evidenced by significant muscle mass and subcu fat loss. Body mass index is 21.91 kg/m. Nutrition Problem: Severe Malnutrition Etiology: chronic illness (chronic alcoholism) Signs/Symptoms: severe fat depletion,severe muscle depletion Interventions: Ensure Enlive (each supplement provides 350kcal and 20 grams of protein),MVI   DVT prophylaxis:  enoxaparin (LOVENOX) injection 40 mg Start: 12/27/20 2200  Code Status: Full code  family Communication: Updated patient's sister over the phone on 4/1. Level of care: Telemetry Status is: Inpatient  Remains inpatient appropriate because:Unsafe d/c plan   Dispo: The patient is from: Homeless              Anticipated d/c is to: SNF              Patient currently is medically stable to d/c.   Difficult to place patient Yes       Consultants:  None   Sch Meds:  Scheduled Meds: . enoxaparin (LOVENOX) injection  40 mg Subcutaneous Q24H  . feeding supplement  237 mL Oral TID BM  . folic acid  1 mg Oral Daily  . lactulose  20 g Oral BID  . multivitamin with minerals  1 tablet Oral Daily  . QUEtiapine  25 mg Oral BID  . sodium chloride flush  3 mL Intravenous Q12H  . thiamine  100 mg Oral Daily   Or  . thiamine  100 mg Intravenous Daily   Continuous Infusions: PRN Meds:.acetaminophen, haloperidol lactate  Antimicrobials: Anti-infectives (From admission, onward)   None       I have personally reviewed the following labs and images: CBC: Recent Labs  Lab 01/15/21 0336 01/17/21 0357 01/19/21 0320  WBC 9.8 9.2 9.2  NEUTROABS  --  4.2  --   HGB 10.6* 11.4* 11.0*  HCT 33.8* 36.1* 34.9*  MCV 100.9* 103.1* 103.3*  PLT 256 267 254   BMP &GFR Recent Labs  Lab 01/15/21 0336 01/17/21 0357 01/19/21 0320  NA 139 137 139  K 4.1 4.5 4.2  CL 103 103 106  CO2 27 25 24   GLUCOSE 92 97 97  BUN 21* 22* 15  CREATININE 1.00 0.84 0.81  CALCIUM  9.9 10.0 9.9  MG 1.8 1.9 1.8  PHOS 5.0* 4.8* 4.8*   Estimated Creatinine Clearance: 93.5 mL/min (by C-G formula based on SCr of 0.81 mg/dL). Liver & Pancreas: Recent Labs  Lab 01/15/21 0336 01/17/21 0357 01/19/21 0320  AST 74* 74* 58*  ALT 59* 63* 52*  ALKPHOS 94 92 93  BILITOT 0.7 0.7 0.7  PROT 7.7 7.8 7.9  ALBUMIN 3.4* 3.5 3.6   No results for input(s): LIPASE, AMYLASE in the last 168 hours. Recent Labs  Lab 01/18/21 0309 01/19/21 1302  AMMONIA 56* 16   Diabetic: No results for input(s): HGBA1C in the last 72 hours. No results for input(s): GLUCAP in the last 168 hours. Cardiac Enzymes: No results for input(s): CKTOTAL, CKMB, CKMBINDEX, TROPONINI in the last 168 hours. No results for input(s): PROBNP in the last 8760 hours. Coagulation Profile: No results for input(s): INR,  PROTIME in the last 168 hours. Thyroid Function Tests: No results for input(s): TSH, T4TOTAL, FREET4, T3FREE, THYROIDAB in the last 72 hours. Lipid Profile: No results for input(s): CHOL, HDL, LDLCALC, TRIG, CHOLHDL, LDLDIRECT in the last 72 hours. Anemia Panel: No results for input(s): VITAMINB12, FOLATE, FERRITIN, TIBC, IRON, RETICCTPCT in the last 72 hours. Urine analysis:    Component Value Date/Time   COLORURINE YELLOW 12/28/2020 0000   APPEARANCEUR HAZY (A) 12/28/2020 0000   LABSPEC 1.025 12/28/2020 0000   PHURINE 5.0 12/28/2020 0000   GLUCOSEU NEGATIVE 12/28/2020 0000   HGBUR NEGATIVE 12/28/2020 0000   BILIRUBINUR NEGATIVE 12/28/2020 0000   KETONESUR NEGATIVE 12/28/2020 0000   PROTEINUR NEGATIVE 12/28/2020 0000   UROBILINOGEN 1.0 05/31/2014 1841   NITRITE NEGATIVE 12/28/2020 0000   LEUKOCYTESUR NEGATIVE 12/28/2020 0000   Sepsis Labs: Invalid input(s): PROCALCITONIN, LACTICIDVEN  Microbiology: Recent Results (from the past 240 hour(s))  SARS CORONAVIRUS 2 (TAT 6-24 HRS) Nasopharyngeal Nasopharyngeal Swab     Status: None   Collection Time: 01/11/21  1:26 PM   Specimen:  Nasopharyngeal Swab  Result Value Ref Range Status   SARS Coronavirus 2 NEGATIVE NEGATIVE Final    Comment: (NOTE) SARS-CoV-2 target nucleic acids are NOT DETECTED.  The SARS-CoV-2 RNA is generally detectable in upper and lower respiratory specimens during the acute phase of infection. Negative results do not preclude SARS-CoV-2 infection, do not rule out co-infections with other pathogens, and should not be used as the sole basis for treatment or other patient management decisions. Negative results must be combined with clinical observations, patient history, and epidemiological information. The expected result is Negative.  Fact Sheet for Patients: HairSlick.no  Fact Sheet for Healthcare Providers: quierodirigir.com  This test is not yet approved or cleared by the [place] FDA and  has been authorized for detection and/or diagnosis of SARS-CoV-2 by FDA under an Emergency Use Authorization (EUA). This EUA will remain  in effect (meaning this test can be used) for the duration of the COVID-19 declaration under Se ction 564(b)(1) of the Act, 21 U.S.C. section 360bbb-3(b)(1), unless the authorization is terminated or revoked sooner.  Performed at [facility], [address] [place], [place] [address]     Radiology Studies: No results found.    Taniya Dasher T. Lilyrose Tanney [facility]  If 7PM-7AM, please contact night-coverage [email or web address] 01/20/2021, 1:04 PM

## 2021-01-21 NOTE — Progress Notes (Signed)
PROGRESS NOTE  Alvin Clark SHF:[id number] DOB: 04-16-1961   PCP: Patient, No Pcp Per (Inactive)  Patient is from: homeless  DOA: 12/27/2020 LOS: 24  Chief complaints: Altered mental status  Brief Narrative / Interim history: Old M with PMH of alcohol abuse and related cognitive decline brought to ED by EMS with altered mental status.  Reportedly trying to enter home that he was evicted from on 3/9, and homeowners called police when they found him disoriented and called EMS.  Patient was admitted for acute metabolic encephalopathy, AKI and significant electrolyte arrangements.  Encephalopathy work-up unrevealing.  Continues to require safety sitter due to high risk of fall as he tends to jump out of the bed unexpectedly.  Subjective: Seen and examined earlier this morning.  No major events overnight of this morning.  No complaints.  He denies pain, shortness of breath, GI or UTI symptoms but not a reliable historian.  Objective: Vitals:   01/20/21 0622 01/20/21 1340 01/20/21 2015 01/21/21 0403  BP: 105/71 115/80 124/73 (!) 130/92  Pulse: 91 (!) 106 94 98  Resp: 16 20 16 17   Temp: 98.5 F (36.9 C) 98.3 F (36.8 C) 97.8 F (36.6 C) (!) 97.3 F (36.3 C)  TempSrc: Oral Oral Oral Oral  SpO2: 99% 99% 98% 99%  Weight:      Height:        Intake/Output Summary (Last 24 hours) at 01/21/2021 1333 Last data filed at 01/21/2021 1202 Gross per 24 hour  Intake 960 ml  Output --  Net 960 ml   Filed Weights   01/15/21 0635 01/18/21 0336 01/19/21 0401  Weight: 64.2 kg 65.5 kg 67.3 kg    Examination:  GENERAL: No apparent distress.  Nontoxic. HEENT: MMM.  Vision and hearing grossly intact.  NECK: Supple.  No apparent JVD.  RESP: On RA.  No IWOB.  Fair aeration bilaterally. CVS:  RRR. Heart sounds normal.  ABD/GI/GU: BS+. Abd soft, NTND.  MSK/EXT:  Moves extremities. No apparent deformity. No edema.  SKIN: no apparent skin lesion or wound NEURO: Awake, alert and oriented to self and  "hospital".  No apparent focal neuro deficit. PSYCH: Calm. Normal affect.   Procedures:  None  Microbiology summarized: COVID-19 and influenza PCR nonreactive.  Assessment & Plan: Acute metabolic encephalopathy in patient with gradually evolving cognitive impairment.  Likely due to alcohol abuse and malnutrition.  Basic encephalopathy work-up including CT head, UDS, RPR, TSH, and B12 unrevealing.  Ammonia elevated to 56>> 16. -Continue lactulose 20 g twice daily-hold if greater than 2 BMs in 24 hours. -Follow thiamine level. -Continue safety sitter given high risk for fall -Continue Seroquel 25 mg twice daily with as needed Haldol -Monitor QTC-427 on 3/30 -Optimize K and Mg  AKI metabolic acidosis: Resolved. Recent Labs    12/30/20 0437 12/31/20 0430 01/01/21 0331 01/03/21 0348 01/08/21 0321 01/10/21 0349 01/12/21 0425 01/15/21 0336 01/17/21 0357 01/19/21 0320  BUN 13 9 8 8 17 14 17  21* 22* 15  CREATININE 0.88 0.88 0.94 0.88 0.87 0.88 0.79 1.00 0.84 0.81   Mild elevated LFT: due to EtOH abuse?  Acute hepatitis panel negative.  Improving. -Monitor intermittently  Macrocytic Anemia: Stable.  Anemia panel normal. Recent Labs    12/29/20 0359 12/30/20 0437 12/31/20 0430 01/03/21 0348 01/08/21 0321 01/10/21 0349 01/12/21 0425 01/15/21 0336 01/17/21 0357 01/19/21 0320  HGB 11.9* 11.1* 10.3* 10.2* 9.6* 10.1* 10.8* 10.6* 11.4* 11.0*  -Monitor intermittently  Chronic EtOH Abuse: Outside the window for withdrawal symptoms. -  Continue multivitamins, folic acid and thiamine  Hypokalemia/hypomagnesemia/hypophosphatemia: Refeeding syndrome?  Resolved. -Monitor intermittently  Impaired Mobility/debility/frequent fall -Therapy recommended SNF.  Difficult to place due to lack of insurance  Constipation -Continue scheduled lactulose  Severe malnutrition: As evidenced by significant muscle mass and subcu fat loss. Body mass index is 21.91 kg/m. Nutrition Problem:  Severe Malnutrition Etiology: chronic illness (chronic alcoholism) Signs/Symptoms: severe fat depletion,severe muscle depletion Interventions: Ensure Enlive (each supplement provides 350kcal and 20 grams of protein),MVI   DVT prophylaxis:  enoxaparin (LOVENOX) injection 40 mg Start: 12/27/20 2200  Code Status: Full code  family Communication: Updated patient's sister over the phone on 4/1. Level of care: Telemetry Status is: Inpatient  Remains inpatient appropriate because:Unsafe d/c plan   Dispo: The patient is from: Homeless              Anticipated d/c is to: SNF              Patient currently is medically stable to d/c.   Difficult to place patient Yes       Consultants:  None   Sch Meds:  Scheduled Meds: . enoxaparin (LOVENOX) injection  40 mg Subcutaneous Q24H  . feeding supplement  237 mL Oral TID BM  . folic acid  1 mg Oral Daily  . lactulose  20 g Oral BID  . multivitamin with minerals  1 tablet Oral Daily  . QUEtiapine  25 mg Oral BID  . sodium chloride flush  3 mL Intravenous Q12H  . thiamine  100 mg Oral Daily   Or  . thiamine  100 mg Intravenous Daily   Continuous Infusions: PRN Meds:.acetaminophen, haloperidol lactate  Antimicrobials: Anti-infectives (From admission, onward)   None       I have personally reviewed the following labs and images: CBC: Recent Labs  Lab 01/15/21 0336 01/17/21 0357 01/19/21 0320  WBC 9.8 9.2 9.2  NEUTROABS  --  4.2  --   HGB 10.6* 11.4* 11.0*  HCT 33.8* 36.1* 34.9*  MCV 100.9* 103.1* 103.3*  PLT 256 267 254   BMP &GFR Recent Labs  Lab 01/15/21 0336 01/17/21 0357 01/19/21 0320  NA 139 137 139  K 4.1 4.5 4.2  CL 103 103 106  CO2 27 25 24   GLUCOSE 92 97 97  BUN 21* 22* 15  CREATININE 1.00 0.84 0.81  CALCIUM 9.9 10.0 9.9  MG 1.8 1.9 1.8  PHOS 5.0* 4.8* 4.8*   Estimated Creatinine Clearance: 93.5 mL/min (by C-G formula based on SCr of 0.81 mg/dL). Liver & Pancreas: Recent Labs  Lab  01/15/21 0336 01/17/21 0357 01/19/21 0320  AST 74* 74* 58*  ALT 59* 63* 52*  ALKPHOS 94 92 93  BILITOT 0.7 0.7 0.7  PROT 7.7 7.8 7.9  ALBUMIN 3.4* 3.5 3.6   No results for input(s): LIPASE, AMYLASE in the last 168 hours. Recent Labs  Lab 01/18/21 0309 01/19/21 1302  AMMONIA 56* 16   Diabetic: No results for input(s): HGBA1C in the last 72 hours. No results for input(s): GLUCAP in the last 168 hours. Cardiac Enzymes: No results for input(s): CKTOTAL, CKMB, CKMBINDEX, TROPONINI in the last 168 hours. No results for input(s): PROBNP in the last 8760 hours. Coagulation Profile: No results for input(s): INR, PROTIME in the last 168 hours. Thyroid Function Tests: No results for input(s): TSH, T4TOTAL, FREET4, T3FREE, THYROIDAB in the last 72 hours. Lipid Profile: No results for input(s): CHOL, HDL, LDLCALC, TRIG, CHOLHDL, LDLDIRECT in the last 72 hours. Anemia Panel:  No results for input(s): VITAMINB12, FOLATE, FERRITIN, TIBC, IRON, RETICCTPCT in the last 72 hours. Urine analysis:    Component Value Date/Time   COLORURINE YELLOW 12/28/2020 0000   APPEARANCEUR HAZY (A) 12/28/2020 0000   LABSPEC 1.025 12/28/2020 0000   PHURINE 5.0 12/28/2020 0000   GLUCOSEU NEGATIVE 12/28/2020 0000   HGBUR NEGATIVE 12/28/2020 0000   BILIRUBINUR NEGATIVE 12/28/2020 0000   KETONESUR NEGATIVE 12/28/2020 0000   PROTEINUR NEGATIVE 12/28/2020 0000   UROBILINOGEN 1.0 05/31/2014 1841   NITRITE NEGATIVE 12/28/2020 0000   LEUKOCYTESUR NEGATIVE 12/28/2020 0000   Sepsis Labs: Invalid input(s): PROCALCITONIN, LACTICIDVEN  Microbiology: No results found for this or any previous visit (from the past 240 hour(s)).  Radiology Studies: No results found.    Servando Kyllonen T. Tavaris Eudy [facility]  If 7PM-7AM, please contact night-coverage [email or web address] 01/21/2021, 1:33 PM

## 2021-01-21 NOTE — Progress Notes (Signed)
Pt on phone call laughing and talking to friend at the time vitals were being checked HR at 113bpm. Less than a minute after pt hung up phone HR decreased to 106bpm. Pt resting in chair no complaints voiced.

## 2021-01-22 LAB — CBC
HCT: 36.6 % — ABNORMAL LOW (ref 39.0–52.0)
Hemoglobin: 11.7 g/dL — ABNORMAL LOW (ref 13.0–17.0)
MCH: 32.4 pg (ref 26.0–34.0)
MCHC: 32 g/dL (ref 30.0–36.0)
MCV: 101.4 fL — ABNORMAL HIGH (ref 80.0–100.0)
Platelets: 254 10*3/uL (ref 150–400)
RBC: 3.61 MIL/uL — ABNORMAL LOW (ref 4.22–5.81)
RDW: 15.4 % (ref 11.5–15.5)
WBC: 10.1 10*3/uL (ref 4.0–10.5)
nRBC: 0 % (ref 0.0–0.2)

## 2021-01-22 LAB — MAGNESIUM: Magnesium: 1.8 mg/dL (ref 1.7–2.4)

## 2021-01-22 LAB — COMPREHENSIVE METABOLIC PANEL
ALT: 50 U/L — ABNORMAL HIGH (ref 0–44)
AST: 58 U/L — ABNORMAL HIGH (ref 15–41)
Albumin: 3.7 g/dL (ref 3.5–5.0)
Alkaline Phosphatase: 90 U/L (ref 38–126)
Anion gap: 10 (ref 5–15)
BUN: 19 mg/dL (ref 6–20)
CO2: 25 mmol/L (ref 22–32)
Calcium: 10 mg/dL (ref 8.9–10.3)
Chloride: 102 mmol/L (ref 98–111)
Creatinine, Ser: 0.92 mg/dL (ref 0.61–1.24)
GFR, Estimated: >60 mL/min (ref >60)
Glucose, Bld: 118 mg/dL — ABNORMAL HIGH (ref 70–99)
Potassium: 4.3 mmol/L (ref 3.5–5.1)
Sodium: 137 mmol/L (ref 135–145)
Total Bilirubin: 0.7 mg/dL (ref 0.3–1.2)
Total Protein: 8 g/dL (ref 6.5–8.1)

## 2021-01-22 LAB — PHOSPHORUS: Phosphorus: 4.6 mg/dL (ref 2.5–4.6)

## 2021-01-22 MED ORDER — CARVEDILOL 6.25 MG PO TABS
6.2500 mg | ORAL_TABLET | Freq: Two times a day (BID) | ORAL | Status: DC
  Administered 2021-01-22 – 2021-04-18 (×167): 6.25 mg via ORAL
  Filled 2021-01-22 (×169): qty 1

## 2021-01-22 NOTE — Progress Notes (Signed)
   01/22/21 1448  Assess: MEWS Score  Temp 97.6 F (36.4 C)  BP (!) 141/93  Pulse Rate (!) 116  Resp 18  SpO2 98 %  O2 Device Room Air  Assess: MEWS Score  MEWS Temp 0  MEWS Systolic 0  MEWS Pulse 2  MEWS RR 0  MEWS LOC 0  MEWS Score 2  MEWS Score Color Yellow  Assess: if the MEWS score is Yellow or Red  Were vital signs taken at a resting state? Yes  Focused Assessment No change from prior assessment  Early Detection of Sepsis Score *See Row Information* Low  MEWS guidelines implemented *See Row Information* Yes  Treat  Pain Scale 0-10  Pain Score 0  Take Vital Signs  Increase Vital Sign Frequency  Yellow: Q 2hr X 2 then Q 4hr X 2, if remains yellow, continue Q 4hrs  Escalate  MEWS: Escalate Yellow: discuss with charge nurse/RN and consider discussing with provider and RRT  Notify: Charge Nurse/RN  Name of Charge Nurse/RN Notified Christena Deem, RN  Date Charge Nurse/RN Notified 01/22/21  Time Charge Nurse/RN Notified 1630  Document  Patient Outcome Not stable and remains on department  Progress note created (see row info) Yes

## 2021-01-22 NOTE — Progress Notes (Signed)
PROGRESS NOTE  Alvin Clark BHA:[id number] DOB: 1961-02-20   PCP: Patient, No Pcp Per (Inactive)  Patient is from: homeless  DOA: 12/27/2020 LOS: 25  Chief complaints: Altered mental status  Brief Narrative / Interim history: Old M with PMH of alcohol abuse and related cognitive decline brought to ED by EMS with altered mental status.  Reportedly trying to enter home that he was evicted from on 3/9, and homeowners called police when they found him disoriented and called EMS.  Patient was admitted for acute metabolic encephalopathy, AKI and significant electrolyte arrangements.  Encephalopathy work-up unrevealing.  Required safety sitter for long time due to high risk of fall as he tends to jump out of the bed unexpectedly.  Subjective: Seen and examined earlier this morning.  No major events overnight of this morning.  Slightly hypertensive and tachycardic last night but not heart rate as patient was not at rest.  No complaints but not a reliable historian.  He is only oriented to self and place.  Objective: Vitals:   01/20/21 2015 01/21/21 0403 01/21/21 1410 01/21/21 1903  BP: 124/73 (!) 130/92 126/80 (!) 141/103  Pulse: 94 98 (!) 106 (!) (P) 108  Resp: 16 17 18 18   Temp: 97.8 F (36.6 C) (!) 97.3 F (36.3 C) 98.7 F (37.1 C) 97.7 F (36.5 C)  TempSrc: Oral Oral Oral Oral  SpO2: 98% 99% 98% 97%  Weight:      Height:        Intake/Output Summary (Last 24 hours) at 01/22/2021 1433 Last data filed at 01/22/2021 0809 Gross per 24 hour  Intake 480 ml  Output --  Net 480 ml   Filed Weights   01/15/21 0635 01/18/21 0336 01/19/21 0401  Weight: 64.2 kg 65.5 kg 67.3 kg    Examination:  GENERAL: No apparent distress.  Nontoxic. HEENT: MMM.  Vision and hearing grossly intact.  NECK: Supple.  No apparent JVD.  RESP: On RA.  No IWOB.  Fair aeration bilaterally. CVS:  RRR. Heart sounds normal.  ABD/GI/GU: BS+. Abd soft, NTND.  MSK/EXT:  Moves extremities. No apparent deformity. No  edema.  SKIN: no apparent skin lesion or wound NEURO: Awake and alert.  Oriented to self and "hospital".  No apparent focal neuro deficit. PSYCH: Calm. Normal affect.  No distress or agitation.  Procedures:  None  Microbiology summarized: COVID-19 and influenza PCR nonreactive.  Assessment & Plan: Acute metabolic encephalopathy in patient with gradually evolving cognitive impairment.  Likely due to alcohol abuse and malnutrition.  Basic encephalopathy work-up including CT head, UDS, RPR, TSH, and B12 unrevealing.  Ammonia elevated to 56>> 16. -Continue lactulose 20 g twice daily-hold if greater than 2 BMs in 24 hours. -Follow thiamine level. -Discontinue safety sitter and observe closely. -Continue Seroquel 25 mg twice daily with as needed Haldol -Monitor QTC-427 on 3/30 -Optimize K and Mg  AKI metabolic acidosis: Resolved. Recent Labs    12/31/20 0430 01/01/21 0331 01/03/21 0348 01/08/21 0321 01/10/21 0349 01/12/21 0425 01/15/21 0336 01/17/21 0357 01/19/21 0320 01/22/21 0300  BUN 9 8 8 17 14 17  21* 22* 15 19  CREATININE 0.88 0.94 0.88 0.87 0.88 0.79 1.00 0.84 0.81 0.92   Mild elevated LFT: due to EtOH abuse?  Acute hepatitis panel negative.  Improving. -Monitor intermittently  Macrocytic Anemia: Stable.  Anemia panel normal. Recent Labs    12/30/20 0437 12/31/20 0430 01/03/21 0348 01/08/21 0321 01/10/21 0349 01/12/21 0425 01/15/21 0336 01/17/21 0357 01/19/21 0320 01/22/21 0300  HGB 11.1*  10.3* 10.2* 9.6* 10.1* 10.8* 10.6* 11.4* 11.0* 11.7*  -Monitor intermittently  Chronic EtOH Abuse: Outside the window for withdrawal symptoms. -Continue multivitamins, folic acid and thiamine  Hypokalemia/hypomagnesemia/hypophosphatemia: Refeeding syndrome?  Resolved. -Monitor intermittently  Impaired Mobility/debility/frequent fall -Therapy recommended SNF.  Difficult to place due to lack of insurance  Constipation -Continue scheduled lactulose  Severe  malnutrition: As evidenced by significant muscle mass and subcu fat loss. Body mass index is 21.91 kg/m. Nutrition Problem: Severe Malnutrition Etiology: chronic illness (chronic alcoholism) Signs/Symptoms: severe fat depletion,severe muscle depletion Interventions: Ensure Enlive (each supplement provides 350kcal and 20 grams of protein),MVI   DVT prophylaxis:  enoxaparin (LOVENOX) injection 40 mg Start: 12/27/20 2200  Code Status: Full code  family Communication: Updated patient's sister over the phone on 4/1. Level of care: Telemetry Status is: Inpatient  Remains inpatient appropriate because:Unsafe d/c plan   Dispo: The patient is from: Homeless              Anticipated d/c is to: SNF              Patient currently is medically stable to d/c.   Difficult to place patient Yes       Consultants:  None   Sch Meds:  Scheduled Meds: . enoxaparin (LOVENOX) injection  40 mg Subcutaneous Q24H  . feeding supplement  237 mL Oral TID BM  . folic acid  1 mg Oral Daily  . lactulose  20 g Oral BID  . multivitamin with minerals  1 tablet Oral Daily  . QUEtiapine  25 mg Oral BID  . sodium chloride flush  3 mL Intravenous Q12H  . thiamine  100 mg Oral Daily   Or  . thiamine  100 mg Intravenous Daily   Continuous Infusions: PRN Meds:.acetaminophen, haloperidol lactate  Antimicrobials: Anti-infectives (From admission, onward)   None       I have personally reviewed the following labs and images: CBC: Recent Labs  Lab 01/17/21 0357 01/19/21 0320 01/22/21 0300  WBC 9.2 9.2 10.1  NEUTROABS 4.2  --   --   HGB 11.4* 11.0* 11.7*  HCT 36.1* 34.9* 36.6*  MCV 103.1* 103.3* 101.4*  PLT 267 254 254   BMP &GFR Recent Labs  Lab 01/17/21 0357 01/19/21 0320 01/22/21 0300  NA 137 139 137  K 4.5 4.2 4.3  CL 103 106 102  CO2 25 24 25   GLUCOSE 97 97 118*  BUN 22* 15 19  CREATININE 0.84 0.81 0.92  CALCIUM 10.0 9.9 10.0  MG 1.9 1.8 1.8  PHOS 4.8* 4.8* 4.6   Estimated  Creatinine Clearance: 82.3 mL/min (by C-G formula based on SCr of 0.92 mg/dL). Liver & Pancreas: Recent Labs  Lab 01/17/21 0357 01/19/21 0320 01/22/21 0300  AST 74* 58* 58*  ALT 63* 52* 50*  ALKPHOS 92 93 90  BILITOT 0.7 0.7 0.7  PROT 7.8 7.9 8.0  ALBUMIN 3.5 3.6 3.7   No results for input(s): LIPASE, AMYLASE in the last 168 hours. Recent Labs  Lab 01/18/21 0309 01/19/21 1302  AMMONIA 56* 16   Diabetic: No results for input(s): HGBA1C in the last 72 hours. No results for input(s): GLUCAP in the last 168 hours. Cardiac Enzymes: No results for input(s): CKTOTAL, CKMB, CKMBINDEX, TROPONINI in the last 168 hours. No results for input(s): PROBNP in the last 8760 hours. Coagulation Profile: No results for input(s): INR, PROTIME in the last 168 hours. Thyroid Function Tests: No results for input(s): TSH, T4TOTAL, FREET4, T3FREE, THYROIDAB in the  last 72 hours. Lipid Profile: No results for input(s): CHOL, HDL, LDLCALC, TRIG, CHOLHDL, LDLDIRECT in the last 72 hours. Anemia Panel: No results for input(s): VITAMINB12, FOLATE, FERRITIN, TIBC, IRON, RETICCTPCT in the last 72 hours. Urine analysis:    Component Value Date/Time   COLORURINE YELLOW 12/28/2020 0000   APPEARANCEUR HAZY (A) 12/28/2020 0000   LABSPEC 1.025 12/28/2020 0000   PHURINE 5.0 12/28/2020 0000   GLUCOSEU NEGATIVE 12/28/2020 0000   HGBUR NEGATIVE 12/28/2020 0000   BILIRUBINUR NEGATIVE 12/28/2020 0000   KETONESUR NEGATIVE 12/28/2020 0000   PROTEINUR NEGATIVE 12/28/2020 0000   UROBILINOGEN 1.0 05/31/2014 1841   NITRITE NEGATIVE 12/28/2020 0000   LEUKOCYTESUR NEGATIVE 12/28/2020 0000   Sepsis Labs: Invalid input(s): PROCALCITONIN, LACTICIDVEN  Microbiology: No results found for this or any previous visit (from the past 240 hour(s)).  Radiology Studies: No results found.    Tiernan Millikin T. Janara Klett [facility]  If 7PM-7AM, please contact night-coverage [email or web address] 01/22/2021, 2:33 PM

## 2021-01-22 NOTE — TOC Progression Note (Signed)
Transition of Care [facility]) - Progression Note    Patient Details  Name: Alvin Clark MRN: [id number] Date of Birth: [birth year]  [facility]) CM/SW Contact  Armanda Heritage, RN Phone Number: 01/22/2021, 11:01 AM  Clinical Narrative:    CM reached out to [facility] care home and [facility], spoke with rep and faxed FL2 information for admission consideration.  CM reached out to [facility], [facility], and [facility].  Fl2 and progress notes faxed to facilities for admission consideration.     Expected Discharge Plan: Assisted Living (versus family care home) Barriers to Discharge: Financial Resources,Homeless with medical needs,Continued Medical Work up,No SNF bed,Requiring sitter/restraints,SNF Pending payor source - LOG,SNF Pending Medicaid,Family Issues  Expected Discharge Plan and Services Expected Discharge Plan: Assisted Living (versus family care home) In-house Referral: Clinical Social Work,Financial Counselor     Living arrangements for the past 2 months: No permanent address                                       Social Determinants of Health (SDOH) Interventions    Readmission Risk Interventions No flowsheet data found.

## 2021-01-22 NOTE — [place] FL2 (Signed)
[place] MEDICAID FL2 LEVEL OF CARE SCREENING TOOL     IDENTIFICATION  Patient Name: Alvin Clark Birthdate: [birth year] Sex: male Admission Date (Current Location): 12/27/2020  [facility] and [place] Number:  Producer, television/film/video and Address:  [facility],  [address] [place] [address]      Provider Number: [id number]  Attending Physician Name and Address:  Almon Hercules, MD  Relative Name and Phone Number:  Boss, Danielsen [phone]  [phone]  Soledad Gerlach Other   [phone]    Current Level of Care: Hospital Recommended Level of Care: [facility] Prior Approval Number:    Date Approved/Denied:   PASRR Number: [id number]  Discharge Plan: Other (Comment) (family care home)    Current Diagnoses: Patient Active Problem List   Diagnosis Date Noted  . Protein-calorie malnutrition, severe 12/29/2020  . Withdrawal symptoms, alcohol (HCC) 12/28/2020  . AKI (acute kidney injury) (HCC) 12/27/2020  . Confusion 12/27/2020  . Leukocytosis 12/27/2020  . Blurry vision, bilateral 12/27/2020  . Transaminitis 12/27/2020  . Constipation 12/27/2020    Orientation RESPIRATION BLADDER Height & Weight     Self,Place  Normal Continent Weight: 67.3 kg Height:  5\' 9"  (175.3 cm)  BEHAVIORAL SYMPTOMS/MOOD NEUROLOGICAL BOWEL NUTRITION STATUS      Continent Diet (Regular)  AMBULATORY STATUS COMMUNICATION OF NEEDS Skin   Supervision Verbally Normal                       Personal Care Assistance Level of Assistance  Bathing,Feeding,Dressing Bathing Assistance: Limited assistance Feeding assistance: Independent Dressing Assistance: Independent     Functional Limitations Info  Sight,Speech,Hearing Sight Info: Adequate Hearing Info: Adequate Speech Info: Adequate    SPECIAL CARE FACTORS FREQUENCY                    Contractures Contractures Info: Not present    Additional Factors Info  Code Status,Allergies Code Status  Info: Full Code Allergies Info: NKA           Current Medications (01/22/2021):  This is the current hospital active medication list Current Facility-Administered Medications  Medication Dose Route Frequency Provider Last Rate Last Admin  . acetaminophen (TYLENOL) tablet 650 mg  650 mg Oral Q6H PRN 03/24/2021, MD   650 mg at 01/22/21 0104  . enoxaparin (LOVENOX) injection 40 mg  40 mg Subcutaneous Q24H 03/24/21, MD   40 mg at 01/21/21 2158  . feeding supplement (ENSURE ENLIVE / ENSURE PLUS) liquid 237 mL  237 mL Oral TID BM Dahal, 2159, MD   237 mL at 01/21/21 1924  . folic acid (FOLVITE) tablet 1 mg  1 mg Oral Daily 03/23/21, MD   1 mg at 01/21/21 0809  . haloperidol lactate (HALDOL) injection 2 mg  2 mg Intramuscular Q6H PRN 03/23/21, MD   2 mg at 01/02/21 1833  . lactulose (CHRONULAC) 10 GM/15ML solution 20 g  20 g Oral BID 01/04/21 T, MD   20 g at 01/21/21 2158  . multivitamin with minerals tablet 1 tablet  1 tablet Oral Daily 2159, MD   1 tablet at 01/21/21 0809  . QUEtiapine (SEROQUEL) tablet 25 mg  25 mg Oral BID 03/23/21, MD   25 mg at 01/21/21 2158  . sodium chloride flush (NS) 0.9 % injection 3 mL  3 mL Intravenous Q12H 2159, MD   3 mL at 01/21/21 0810  .  thiamine tablet 100 mg  100 mg Oral Daily Synetta Fail, MD   100 mg at 01/21/21 1751   Or  . thiamine (B-1) injection 100 mg  100 mg Intravenous Daily Synetta Fail, MD   100 mg at 12/31/20 1100     Discharge Medications: Please see discharge summary for a list of discharge medications.  Relevant Imaging Results:  Relevant Lab Results:   Additional Information SSN [id number]  Armanda Heritage, RN

## 2021-01-23 NOTE — Progress Notes (Signed)
PROGRESS NOTE  PRIYANSH PRY VXB:[id number] DOB: January 12, 1961   PCP: Patient, No Pcp Per (Inactive)  Patient is from: homeless  DOA: 12/27/2020 LOS: 26  Chief complaints: Altered mental status  Brief Narrative / Interim history: Old M with PMH of alcohol abuse and related cognitive decline brought to ED by EMS with altered mental status.  Reportedly trying to enter home that he was evicted from on 3/9, and homeowners called police when they found him disoriented and called EMS.  Patient was admitted for acute metabolic encephalopathy, AKI and significant electrolyte arrangements.  Encephalopathy work-up unrevealing.  Required safety sitter for long time due to high risk of fall as he tends to jump out of the bed unexpectedly.  Waiting on SNF bed.  Subjective: Seen and examined earlier this morning.  No major events overnight of this morning.  No complaints but not a reliable historian.  He is oriented to self and "hospital".   Objective: Vitals:   01/23/21 0459 01/23/21 0500 01/23/21 0836 01/23/21 1325  BP: 102/62  125/85 (!) 124/93  Pulse: 87  93 (!) 117  Resp: 18   20  Temp: 97.7 F (36.5 C)   97.7 F (36.5 C)  TempSrc:    Oral  SpO2: 100%   98%  Weight:  69 kg    Height:        Intake/Output Summary (Last 24 hours) at 01/23/2021 1644 Last data filed at 01/22/2021 1850 Gross per 24 hour  Intake 240 ml  Output --  Net 240 ml   Filed Weights   01/18/21 0336 01/19/21 0401 01/23/21 0500  Weight: 65.5 kg 67.3 kg 69 kg    Examination:  GENERAL: No apparent distress.  Nontoxic. HEENT: MMM.  Vision and hearing grossly intact.  NECK: Supple.  No apparent JVD.  RESP: On RA.  No IWOB.  Fair aeration bilaterally. CVS:  RRR. Heart sounds normal.  ABD/GI/GU: BS+. Abd soft, NTND.  MSK/EXT:  Moves extremities. No apparent deformity. No edema.  SKIN: no apparent skin lesion or wound NEURO: Awake and alert.  Oriented to self and "hospital".  No apparent focal neuro deficit. PSYCH:  Calm. Normal affect.  Procedures:  None  Microbiology summarized: COVID-19 and influenza PCR nonreactive.  Assessment & Plan: Acute metabolic encephalopathy in patient with gradually evolving cognitive impairment.  Likely due to alcohol abuse and malnutrition.  Basic encephalopathy work-up including CT head, UDS, RPR, TSH, and B12 unrevealing.  Ammonia elevated to 56>> 16. -Continue lactulose 20 g twice daily-hold if greater than 2 BMs in 24 hours. -Follow thiamine level. -Discontinue safety sitter and observe closely. -Continue Seroquel 25 mg twice daily with as needed Haldol -Monitor QTC-427 on 3/30 -Optimize K and Mg  AKI metabolic acidosis: Resolved. Recent Labs    12/31/20 0430 01/01/21 0331 01/03/21 0348 01/08/21 0321 01/10/21 0349 01/12/21 0425 01/15/21 0336 01/17/21 0357 01/19/21 0320 01/22/21 0300  BUN 9 8 8 17 14 17  21* 22* 15 19  CREATININE 0.88 0.94 0.88 0.87 0.88 0.79 1.00 0.84 0.81 0.92   Mild elevated LFT: due to EtOH abuse?  Acute hepatitis panel negative.  Improving. -Monitor intermittently  Macrocytic Anemia: Stable.  Anemia panel normal. Recent Labs    12/30/20 0437 12/31/20 0430 01/03/21 0348 01/08/21 0321 01/10/21 0349 01/12/21 0425 01/15/21 0336 01/17/21 0357 01/19/21 0320 01/22/21 0300  HGB 11.1* 10.3* 10.2* 9.6* 10.1* 10.8* 10.6* 11.4* 11.0* 11.7*  -Monitor intermittently  Chronic EtOH Abuse: Outside the window for withdrawal symptoms. -Continue multivitamins, folic acid  and thiamine  Hypokalemia/hypomagnesemia/hypophosphatemia: Refeeding syndrome?  Resolved. -Monitor intermittently  Impaired Mobility/debility/frequent fall -Therapy recommended SNF.  Difficult to place due to lack of insurance  Elevated BP/sinus tachycardia-improved with Coreg. -Continue Coreg.  Constipation -Continue scheduled lactulose  Severe malnutrition: As evidenced by significant muscle mass and subcu fat loss. Body mass index is 22.46  kg/m. Nutrition Problem: Severe Malnutrition Etiology: chronic illness (chronic alcoholism) Signs/Symptoms: severe fat depletion,severe muscle depletion Interventions: Ensure Enlive (each supplement provides 350kcal and 20 grams of protein),MVI   DVT prophylaxis:  enoxaparin (LOVENOX) injection 40 mg Start: 12/27/20 2200  Code Status: Full code  family Communication: Updated patient's sister over the phone on 4/1. Level of care: Telemetry Status is: Inpatient  Remains inpatient appropriate because:Unsafe d/c plan   Dispo: The patient is from: Homeless              Anticipated d/c is to: SNF              Patient currently is medically stable to d/c.   Difficult to place patient Yes       Consultants:  None   Sch Meds:  Scheduled Meds: . carvedilol  6.25 mg Oral BID WC  . enoxaparin (LOVENOX) injection  40 mg Subcutaneous Q24H  . feeding supplement  237 mL Oral TID BM  . folic acid  1 mg Oral Daily  . lactulose  20 g Oral BID  . multivitamin with minerals  1 tablet Oral Daily  . QUEtiapine  25 mg Oral BID  . sodium chloride flush  3 mL Intravenous Q12H  . thiamine  100 mg Oral Daily   Or  . thiamine  100 mg Intravenous Daily   Continuous Infusions: PRN Meds:.acetaminophen, haloperidol lactate  Antimicrobials: Anti-infectives (From admission, onward)   None       I have personally reviewed the following labs and images: CBC: Recent Labs  Lab 01/17/21 0357 01/19/21 0320 01/22/21 0300  WBC 9.2 9.2 10.1  NEUTROABS 4.2  --   --   HGB 11.4* 11.0* 11.7*  HCT 36.1* 34.9* 36.6*  MCV 103.1* 103.3* 101.4*  PLT 267 254 254   BMP &GFR Recent Labs  Lab 01/17/21 0357 01/19/21 0320 01/22/21 0300  NA 137 139 137  K 4.5 4.2 4.3  CL 103 106 102  CO2 25 24 25   GLUCOSE 97 97 118*  BUN 22* 15 19  CREATININE 0.84 0.81 0.92  CALCIUM 10.0 9.9 10.0  MG 1.9 1.8 1.8  PHOS 4.8* 4.8* 4.6   Estimated Creatinine Clearance: 84.4 mL/min (by C-G formula based on SCr  of 0.92 mg/dL). Liver & Pancreas: Recent Labs  Lab 01/17/21 0357 01/19/21 0320 01/22/21 0300  AST 74* 58* 58*  ALT 63* 52* 50*  ALKPHOS 92 93 90  BILITOT 0.7 0.7 0.7  PROT 7.8 7.9 8.0  ALBUMIN 3.5 3.6 3.7   No results for input(s): LIPASE, AMYLASE in the last 168 hours. Recent Labs  Lab 01/18/21 0309 01/19/21 1302  AMMONIA 56* 16   Diabetic: No results for input(s): HGBA1C in the last 72 hours. No results for input(s): GLUCAP in the last 168 hours. Cardiac Enzymes: No results for input(s): CKTOTAL, CKMB, CKMBINDEX, TROPONINI in the last 168 hours. No results for input(s): PROBNP in the last 8760 hours. Coagulation Profile: No results for input(s): INR, PROTIME in the last 168 hours. Thyroid Function Tests: No results for input(s): TSH, T4TOTAL, FREET4, T3FREE, THYROIDAB in the last 72 hours. Lipid Profile: No results for input(s):  CHOL, HDL, LDLCALC, TRIG, CHOLHDL, LDLDIRECT in the last 72 hours. Anemia Panel: No results for input(s): VITAMINB12, FOLATE, FERRITIN, TIBC, IRON, RETICCTPCT in the last 72 hours. Urine analysis:    Component Value Date/Time   COLORURINE YELLOW 12/28/2020 0000   APPEARANCEUR HAZY (A) 12/28/2020 0000   LABSPEC 1.025 12/28/2020 0000   PHURINE 5.0 12/28/2020 0000   GLUCOSEU NEGATIVE 12/28/2020 0000   HGBUR NEGATIVE 12/28/2020 0000   BILIRUBINUR NEGATIVE 12/28/2020 0000   KETONESUR NEGATIVE 12/28/2020 0000   PROTEINUR NEGATIVE 12/28/2020 0000   UROBILINOGEN 1.0 05/31/2014 1841   NITRITE NEGATIVE 12/28/2020 0000   LEUKOCYTESUR NEGATIVE 12/28/2020 0000   Sepsis Labs: Invalid input(s): PROCALCITONIN, LACTICIDVEN  Microbiology: No results found for this or any previous visit (from the past 240 hour(s)).  Radiology Studies: No results found.    Gerrad Welker T. Aronda Burford [facility]  If 7PM-7AM, please contact night-coverage [email or web address] 01/23/2021, 4:44 PM

## 2021-01-23 NOTE — Progress Notes (Signed)
Physical Therapy Treatment Patient Details Name: Alvin Clark MRN: [id number] DOB: July 07, 1961 Today's Date: 01/23/2021    History of Present Illness Pt admitted on 12/27/20 with Acute metabolic encephalopathy, Chronic evolving memory issues due to Chronic alcoholism. He had a fall out of bed on 3/11. Pt remains hospitalized due to homeless and difficult to place situation.    PT Comments    Assisted OOB to amb in hallway.  General Gait Details: amb without walker present with forward momentum lean and x 2 "tripping" actions as pt tends to minimize heel strike and tendancy to "toe trip" .  50% VC's to decrease gait speed and increase heel strike.  Follow Up Recommendations  Supervision/Assistance - 24 hour;SNF     Equipment Recommendations  Rolling walker with 5" wheels    Recommendations for Other Services       Precautions / Restrictions Precautions Precautions: Fall Precaution Comments: incontinent    Mobility  Bed Mobility Overal bed mobility: Modified Independent             General bed mobility comments: self able with use of rails    Transfers Overall transfer level: Needs assistance Equipment used: Rolling walker (2 wheeled);None Transfers: Sit to/from [employer] to Stand: Min guard Stand pivot transfers: Min guard;Min assist       General transfer comment: safety belt applied as pt is quick moving and impulsive.  Ambulation/Gait Ambulation/Gait assistance: Min guard;Min assist Gait Distance (Feet): 550 Feet Assistive device: None Gait Pattern/deviations: Step-through pattern;Narrow base of support;Staggering left;Staggering right;Scissoring Gait velocity: fast/impulsive   General Gait Details: amb without walker present with forward momentum lean and x 2 "tripping" actions as pt tends to minimize heel strike and tendancy to "toe trip" .  50% VC's to decrease gait speed and increase heel strike.   Stairs              Wheelchair Mobility    Modified Rankin (Stroke Patients Only)       Balance                                            Cognition Arousal/Alertness: Awake/alert Behavior During Therapy: Impulsive Overall Cognitive Status: No family/caregiver present to determine baseline cognitive functioning Area of Impairment: Safety/judgement;Problem solving                 Orientation Level: Disoriented to;Time;Situation       Safety/Judgement: Decreased awareness of safety;Decreased awareness of deficits Awareness: Emergent Problem Solving: Requires verbal cues General Comments: AxO x 3 but impulsive      Exercises      General Comments        Pertinent Vitals/Pain Pain Location: low back    Home Living                      Prior Function            PT Goals (current goals can now be found in the care plan section) Progress towards PT goals: Progressing toward goals    Frequency    Min 2X/week      PT Plan Current plan remains appropriate    Co-evaluation              AM-PAC PT "6 Clicks" Mobility   Outcome Measure  Help needed turning from your back to your side while  in a flat bed without using bedrails?: None Help needed moving from lying on your back to sitting on the side of a flat bed without using bedrails?: None Help needed moving to and from a bed to a chair (including a wheelchair)?: A Little Help needed standing up from a chair using your arms (e.g., wheelchair or bedside chair)?: A Little Help needed to walk in hospital room?: A Little Help needed climbing 3-5 steps with a railing? : A Little 6 Click Score: 20    End of Session Equipment Utilized During Treatment: Gait belt Activity Tolerance: Patient tolerated treatment well Patient left: in bed;with call bell/phone within reach;with nursing/sitter in room;with bed alarm set Nurse Communication: Mobility status PT Visit Diagnosis: Unsteadiness on  feet (R26.81);Other abnormalities of gait and mobility (R26.89);History of falling (Z91.81)     Time: 4403-4742 PT Time Calculation (min) (ACUTE ONLY): 14 min  Charges:  $Gait Training: 8-22 mins                     Felecia Shelling  PTA Acute  Rehabilitation Services Pager      [phone] Office      [phone]

## 2021-01-23 NOTE — Plan of Care (Signed)
  Problem: Nutrition: Goal: Adequate nutrition will be maintained Outcome: Progressing   Problem: Coping: Goal: Level of anxiety will decrease Outcome: Progressing   Problem: Education: Goal: Knowledge of disease and its progression will improve Outcome: Progressing   Problem: Safety: Goal: Ability to remain free from injury will improve Outcome: Progressing

## 2021-01-24 LAB — COMPREHENSIVE METABOLIC PANEL
ALT: 42 U/L (ref 0–44)
AST: 45 U/L — ABNORMAL HIGH (ref 15–41)
Albumin: 3.6 g/dL (ref 3.5–5.0)
Alkaline Phosphatase: 85 U/L (ref 38–126)
Anion gap: 9 (ref 5–15)
BUN: 19 mg/dL (ref 6–20)
CO2: 27 mmol/L (ref 22–32)
Calcium: 9.9 mg/dL (ref 8.9–10.3)
Chloride: 104 mmol/L (ref 98–111)
Creatinine, Ser: 1.01 mg/dL (ref 0.61–1.24)
GFR, Estimated: >60 mL/min (ref >60)
Glucose, Bld: 105 mg/dL — ABNORMAL HIGH (ref 70–99)
Potassium: 4.5 mmol/L (ref 3.5–5.1)
Sodium: 140 mmol/L (ref 135–145)
Total Bilirubin: 0.4 mg/dL (ref 0.3–1.2)
Total Protein: 7.8 g/dL (ref 6.5–8.1)

## 2021-01-24 LAB — MAGNESIUM: Magnesium: 1.9 mg/dL (ref 1.7–2.4)

## 2021-01-24 LAB — CBC
HCT: 37.6 % — ABNORMAL LOW (ref 39.0–52.0)
Hemoglobin: 12.1 g/dL — ABNORMAL LOW (ref 13.0–17.0)
MCH: 33 pg (ref 26.0–34.0)
MCHC: 32.2 g/dL (ref 30.0–36.0)
MCV: 102.5 fL — ABNORMAL HIGH (ref 80.0–100.0)
Platelets: 234 10*3/uL (ref 150–400)
RBC: 3.67 MIL/uL — ABNORMAL LOW (ref 4.22–5.81)
RDW: 14.9 % (ref 11.5–15.5)
WBC: 9.8 10*3/uL (ref 4.0–10.5)
nRBC: 0 % (ref 0.0–0.2)

## 2021-01-24 LAB — PHOSPHORUS: Phosphorus: 5.5 mg/dL — ABNORMAL HIGH (ref 2.5–4.6)

## 2021-01-24 MED ORDER — THIAMINE HCL 100 MG/ML IJ SOLN
250.0000 mg | Freq: Every day | INTRAVENOUS | Status: AC
  Administered 2021-01-27 – 2021-01-31 (×5): 250 mg via INTRAVENOUS
  Filled 2021-01-24 (×5): qty 2.5

## 2021-01-24 MED ORDER — VITAMIN B-12 1000 MCG PO TABS
1000.0000 ug | ORAL_TABLET | Freq: Every day | ORAL | Status: DC
  Administered 2021-01-24 – 2021-04-18 (×85): 1000 ug via ORAL
  Filled 2021-01-24 (×86): qty 1

## 2021-01-24 MED ORDER — THIAMINE HCL 100 MG/ML IJ SOLN
500.0000 mg | Freq: Three times a day (TID) | INTRAVENOUS | Status: AC
  Administered 2021-01-24 – 2021-01-27 (×10): 500 mg via INTRAVENOUS
  Filled 2021-01-24 (×11): qty 5

## 2021-01-24 MED ORDER — THIAMINE HCL 100 MG PO TABS
100.0000 mg | ORAL_TABLET | Freq: Every day | ORAL | Status: DC
  Administered 2021-02-01 – 2021-04-18 (×77): 100 mg via ORAL
  Filled 2021-01-24 (×77): qty 1

## 2021-01-24 NOTE — Plan of Care (Signed)
  Problem: Education: Goal: Knowledge of General Education information will improve Description: Including pain rating scale, medication(s)/side effects and non-pharmacologic comfort measures Outcome: Progressing   Problem: Health Behavior/Discharge Planning: Goal: Ability to manage health-related needs will improve Outcome: Progressing   Problem: Clinical Measurements: Goal: Ability to maintain clinical measurements within normal limits will improve Outcome: Progressing Goal: Will remain free from infection Outcome: Progressing Goal: Diagnostic test results will improve Outcome: Progressing Goal: Respiratory complications will improve Outcome: Progressing Goal: Cardiovascular complication will be avoided Outcome: Progressing   Problem: Activity: Goal: Risk for activity intolerance will decrease Outcome: Progressing   Problem: Nutrition: Goal: Adequate nutrition will be maintained Outcome: Progressing   Problem: Coping: Goal: Level of anxiety will decrease Outcome: Progressing   Problem: Elimination: Goal: Will not experience complications related to bowel motility Outcome: Progressing Goal: Will not experience complications related to urinary retention Outcome: Progressing   Problem: Pain Managment: Goal: General experience of comfort will improve Outcome: Progressing   Problem: Safety: Goal: Ability to remain free from injury will improve Outcome: Progressing   Problem: Skin Integrity: Goal: Risk for impaired skin integrity will decrease Outcome: Progressing   Problem: Education: Goal: Knowledge of disease and its progression will improve Outcome: Progressing Goal: Individualized Educational Video(s) Outcome: Progressing   Problem: Fluid Volume: Goal: Compliance with measures to maintain balanced fluid volume will improve Outcome: Progressing   Problem: Health Behavior/Discharge Planning: Goal: Ability to manage health-related needs will  improve Outcome: Progressing   Problem: Nutritional: Goal: Ability to make healthy dietary choices will improve Outcome: Progressing   Problem: Clinical Measurements: Goal: Complications related to the disease process, condition or treatment will be avoided or minimized Outcome: Progressing   Problem: Safety: Goal: Non-violent Restraint(s) Outcome: Progressing   

## 2021-01-24 NOTE — Progress Notes (Signed)
PROGRESS NOTE    Alvin Clark  VEL:[id number] DOB: 1961/04/06 DOA: 12/27/2020 PCP: Patient, No Pcp Per (Inactive)  Chief Complaint  Patient presents with  . Altered Mental Status    Brief Narrative:  60 yo M with PMH of alcohol abuse and related cognitive decline brought to ED by EMS with altered mental status.  Reportedly trying to enter home that he was evicted from on 3/9, and homeowners called police when they found him disoriented and called EMS.  Patient was admitted for acute metabolic encephalopathy, AKI and significant electrolyte arrangements.  Encephalopathy work-up unrevealing.  Required safety sitter for long time due to high risk of fall as he tends to jump out of the bed unexpectedly.  Waiting on SNF bed.  Assessment & Plan:   Principal Problem:   AKI (acute kidney injury) (HCC) Active Problems:   Confusion   Leukocytosis   Blurry vision, bilateral   Transaminitis   Constipation   Withdrawal symptoms, alcohol (HCC)   Protein-calorie malnutrition, severe  Acute metabolic encephalopathy in patient with gradually evolving cognitive impairment.  Likely due to alcohol abuse and malnutrition.  Basic encephalopathy work-up including CT head, UDS, RPR (negative), TSH (wnl), and B12 unrevealing (low normal).  Ammonia elevated to 56>> 16. -Continue lactulose 20 g twice daily-hold if greater than 2 BMs in 24 hours. -Follow thiamine level (pending) -> high dose thiamine -Discontinue safety sitter and observe closely. -Continue Seroquel 25 mg twice daily with as needed Haldol -Monitor QTC-427 on 3/30 -Optimize K and Mg  AKI metabolic acidosis: Resolved.  Mild elevated LFT: due to EtOH abuse?  Acute hepatitis panel negative.  Improving. -Monitor intermittently -consider RUQ [place]  Macrocytic Anemia  Low Normal B12: Stable.  Anemia panel normal, though B12 is low normal -> supplement, follow MMA.  Chronic EtOH Abuse: Outside the window for withdrawal symptoms. -Continue  multivitamins, folic acid and thiamine  Hypokalemia/hypomagnesemia/hypophosphatemia: Refeeding syndrome?  Resolved. -Monitor intermittently  Impaired Mobility/debility/frequent fall -Therapy recommended SNF.  Difficult to place due to lack of insurance  Elevated BP/sinus tachycardia-improved with Coreg. -Continue Coreg.  Constipation -Continue scheduled lactulose  Severe malnutrition: As evidenced by significant muscle mass and subcu fat loss. Body mass index is 22.46 kg/m. Nutrition Problem: Severe Malnutrition Etiology: chronic illness (chronic alcoholism) Signs/Symptoms: severe fat depletion,severe muscle depletion Interventions: Ensure Enlive (each supplement provides 350kcal and 20 grams of protein),MVI  DVT prophylaxis: lovenox Code Status: full  Family Communication: none at bedside Disposition:   Status is: Inpatient  Remains inpatient appropriate because:Inpatient level of care appropriate due to severity of illness   Dispo: The patient is from: Home              Anticipated d/c is to: SNF              Patient currently is medically stable to d/c.   Difficult to place patient Yes       Consultants:   none  Procedures:  none  Antimicrobials:  Anti-infectives (From admission, onward)   None      Subjective: No complaints today Alvin Clark&Ox1 today  Objective: Vitals:   01/23/21 1330 01/23/21 1932 01/24/21 0617 01/24/21 1317  BP: (!) 124/93 110/65 100/66 98/68  Pulse: (!) 108 99 89 94  Resp: 18 18 18 14   Temp: 97.7 F (36.5 C) 99.5 F (37.5 C) 97.8 F (36.6 C) 98.6 F (37 C)  TempSrc:   Oral Oral  SpO2: 98% 97% 97% 97%  Weight:      Height:  No intake or output data in the 24 hours ending 01/24/21 1440 Filed Weights   01/18/21 0336 01/19/21 0401 01/23/21 0500  Weight: 65.5 kg 67.3 kg 69 kg    Examination:  General exam: Appears calm and comfortable  Respiratory system: Clear to auscultation. Respiratory effort  normal. Cardiovascular system: S1 & S2 heard, RRR. Gastrointestinal system: Abdomen is nondistended, soft and nontender.  Central nervous system: Alert and oriented x1. No focal neurological deficits. Extremities: no LEE  Data Reviewed: I have personally reviewed following labs and imaging studies  CBC: Recent Labs  Lab 01/19/21 0320 01/22/21 0300 01/24/21 0317  WBC 9.2 10.1 9.8  HGB 11.0* 11.7* 12.1*  HCT 34.9* 36.6* 37.6*  MCV 103.3* 101.4* 102.5*  PLT 254 254 234    Basic Metabolic Panel: Recent Labs  Lab 01/19/21 0320 01/22/21 0300 01/24/21 0317  NA 139 137 140  K 4.2 4.3 4.5  CL 106 102 104  CO2 24 25 27   GLUCOSE 97 118* 105*  BUN 15 19 19   CREATININE 0.81 0.92 1.01  CALCIUM 9.9 10.0 9.9  MG 1.8 1.8 1.9  PHOS 4.8* 4.6 5.5*    GFR: Estimated Creatinine Clearance: 76.9 mL/min (by C-G formula based on SCr of 1.01 mg/dL).  Liver Function Tests: Recent Labs  Lab 01/19/21 0320 01/22/21 0300 01/24/21 0317  AST 58* 58* 45*  ALT 52* 50* 42  ALKPHOS 93 90 85  BILITOT 0.7 0.7 0.4  PROT 7.9 8.0 7.8  ALBUMIN 3.6 3.7 3.6    CBG: No results for input(s): GLUCAP in the last 168 hours.   No results found for this or any previous visit (from the past 240 hour(s)).       Radiology Studies: No results found.      Scheduled Meds: . carvedilol  6.25 mg Oral BID WC  . enoxaparin (LOVENOX) injection  40 mg Subcutaneous Q24H  . feeding supplement  237 mL Oral TID BM  . folic acid  1 mg Oral Daily  . lactulose  20 g Oral BID  . multivitamin with minerals  1 tablet Oral Daily  . QUEtiapine  25 mg Oral BID  . sodium chloride flush  3 mL Intravenous Q12H  . [START ON 02/01/2021] thiamine  100 mg Oral Daily  . vitamin B-12  1,000 mcg Oral Daily   Continuous Infusions: . thiamine injection 500 mg (01/24/21 1350)   Followed by  . [START ON 01/27/2021] thiamine injection       LOS: 27 days    Time spent: over 30 min    03/26/21, MD [facility]   To contact the attending provider between 7A-7P or the covering provider during after hours 7P-7A, please log into the web site [email or web address] and access using universal [place] password for that web site. If you do not have the password, please call the hospital operator.  01/24/2021, 2:40 PM

## 2021-01-25 NOTE — Progress Notes (Signed)
PROGRESS NOTE    Alvin Clark  GUR:[id number] DOB: 08-07-1961 DOA: 12/27/2020 PCP: Patient, No Pcp Per (Inactive)  Chief Complaint  Patient presents with  . Altered Mental Status    Brief Narrative:  60 yo M with PMH of alcohol abuse and related cognitive decline brought to ED by EMS with altered mental status.  Reportedly trying to enter home that he was evicted from on 3/9, and homeowners called police when they found him disoriented and called EMS.  Patient was admitted for acute metabolic encephalopathy, AKI and significant electrolyte arrangements.  Encephalopathy work-up unrevealing.  Required safety sitter for long time due to high risk of fall as he tends to jump out of the bed unexpectedly.  Waiting on SNF bed.  Assessment & Plan:   Principal Problem:   AKI (acute kidney injury) (HCC) Active Problems:   Confusion   Leukocytosis   Blurry vision, bilateral   Transaminitis   Constipation   Withdrawal symptoms, alcohol (HCC)   Protein-calorie malnutrition, severe  Acute metabolic encephalopathy in patient with gradually evolving cognitive impairment.  Likely due to alcohol abuse and malnutrition.  Basic encephalopathy work-up including CT head, UDS, RPR (negative), TSH (wnl), and B12 unrevealing (low normal).  Ammonia elevated to 56>> 16. -stable today -Continue lactulose 20 g twice daily-hold if greater than 2 BMs in 24 hours. -Follow thiamine level (pending) -> high dose thiamine -Discontinue safety sitter and observe closely. -Continue Seroquel 25 mg twice daily with as needed Haldol -Monitor QTC-427 on 3/30 -Optimize K and Mg  AKI metabolic acidosis: Resolved.  Mild elevated LFT: due to EtOH abuse?  Acute hepatitis panel negative.  Improving. -Monitor intermittently -consider RUQ [place]  Macrocytic Anemia  Low Normal B12: Stable.  Anemia panel normal, though B12 is low normal -> supplement, follow MMA.  Chronic EtOH Abuse: Outside the window for withdrawal  symptoms. -Continue multivitamins, folic acid and thiamine  Hypokalemia/hypomagnesemia/hypophosphatemia: Refeeding syndrome?  Resolved. -Monitor intermittently  Impaired Mobility/debility/frequent fall -Therapy recommended SNF.  Difficult to place due to lack of insurance  Elevated BP/sinus tachycardia-improved with Coreg. -Continue Coreg.  Constipation -Continue scheduled lactulose  Severe malnutrition: As evidenced by significant muscle mass and subcu fat loss. Body mass index is 22.46 kg/m. Nutrition Problem: Severe Malnutrition Etiology: chronic illness (chronic alcoholism) Signs/Symptoms: severe fat depletion,severe muscle depletion Interventions: Ensure Enlive (each supplement provides 350kcal and 20 grams of protein),MVI  DVT prophylaxis: lovenox Code Status: full  Family Communication: none at bedside Disposition:   Status is: Inpatient  Remains inpatient appropriate because:Inpatient level of care appropriate due to severity of illness   Dispo: The patient is from: Home              Anticipated d/c is to: SNF              Patient currently is medically stable to d/c.   Difficult to place patient Yes       Consultants:   none  Procedures:  none  Antimicrobials:  Anti-infectives (From admission, onward)   None      Subjective: No complaints today  Objective: Vitals:   01/25/21 0500 01/25/21 0821 01/25/21 1013 01/25/21 1309  BP:  104/69  122/78  Pulse:  88  99  Resp:  18  16  Temp:    98.2 F (36.8 C)  TempSrc:    Oral  SpO2:  98% 100% 98%  Weight: 70 kg     Height:        Intake/Output Summary (Last  24 hours) at 01/25/2021 1522 Last data filed at 01/25/2021 0946 Gross per 24 hour  Intake 472 ml  Output --  Net 472 ml   Filed Weights   01/19/21 0401 01/23/21 0500 01/25/21 0500  Weight: 67.3 kg 69 kg 70 kg    Examination:  General: No acute distress. Pleasant - seen walking hall with therapy Cardiovascular: RRR Lungs:  unlabored Abdomen: Soft, nontender, nondistended  Neurological: Alert and oriented 3. Moves all extremities 4. Cranial nerves II through XII grossly intact. Skin: Warm and dry. No rashes or lesions. Extremities: No clubbing or cyanosis. No edema.   Data Reviewed: I have personally reviewed following labs and imaging studies  CBC: Recent Labs  Lab 01/19/21 0320 01/22/21 0300 01/24/21 0317  WBC 9.2 10.1 9.8  HGB 11.0* 11.7* 12.1*  HCT 34.9* 36.6* 37.6*  MCV 103.3* 101.4* 102.5*  PLT 254 254 234    Basic Metabolic Panel: Recent Labs  Lab 01/19/21 0320 01/22/21 0300 01/24/21 0317  NA 139 137 140  K 4.2 4.3 4.5  CL 106 102 104  CO2 24 25 27   GLUCOSE 97 118* 105*  BUN 15 19 19   CREATININE 0.81 0.92 1.01  CALCIUM 9.9 10.0 9.9  MG 1.8 1.8 1.9  PHOS 4.8* 4.6 5.5*    GFR: Estimated Creatinine Clearance: 78 mL/min (by C-G formula based on SCr of 1.01 mg/dL).  Liver Function Tests: Recent Labs  Lab 01/19/21 0320 01/22/21 0300 01/24/21 0317  AST 58* 58* 45*  ALT 52* 50* 42  ALKPHOS 93 90 85  BILITOT 0.7 0.7 0.4  PROT 7.9 8.0 7.8  ALBUMIN 3.6 3.7 3.6    CBG: No results for input(s): GLUCAP in the last 168 hours.   No results found for this or any previous visit (from the past 240 hour(s)).       Radiology Studies: No results found.      Scheduled Meds: . carvedilol  6.25 mg Oral BID WC  . enoxaparin (LOVENOX) injection  40 mg Subcutaneous Q24H  . feeding supplement  237 mL Oral TID BM  . folic acid  1 mg Oral Daily  . lactulose  20 g Oral BID  . multivitamin with minerals  1 tablet Oral Daily  . QUEtiapine  25 mg Oral BID  . sodium chloride flush  3 mL Intravenous Q12H  . [START ON 02/01/2021] thiamine  100 mg Oral Daily  . vitamin B-12  1,000 mcg Oral Daily   Continuous Infusions: . thiamine injection 500 mg (01/25/21 1405)   Followed by  . [START ON 01/27/2021] thiamine injection       LOS: 28 days    Time spent: over 30  min    03/27/21, MD [facility]   To contact the attending provider between 7A-7P or the covering provider during after hours 7P-7A, please log into the web site [email or web address] and access using universal [place] password for that web site. If you do not have the password, please call the hospital operator.  01/25/2021, 3:22 PM

## 2021-01-25 NOTE — Plan of Care (Signed)
  Problem: Education: Goal: Knowledge of General Education information will improve Description: Including pain rating scale, medication(s)/side effects and non-pharmacologic comfort measures Outcome: Progressing   Problem: Health Behavior/Discharge Planning: Goal: Ability to manage health-related needs will improve Outcome: Progressing   Problem: Clinical Measurements: Goal: Ability to maintain clinical measurements within normal limits will improve Outcome: Progressing Goal: Will remain free from infection Outcome: Progressing Goal: Diagnostic test results will improve Outcome: Progressing Goal: Respiratory complications will improve Outcome: Progressing Goal: Cardiovascular complication will be avoided Outcome: Progressing   Problem: Activity: Goal: Risk for activity intolerance will decrease Outcome: Progressing   Problem: Nutrition: Goal: Adequate nutrition will be maintained Outcome: Progressing   Problem: Coping: Goal: Level of anxiety will decrease Outcome: Progressing   Problem: Elimination: Goal: Will not experience complications related to bowel motility Outcome: Progressing Goal: Will not experience complications related to urinary retention Outcome: Progressing   Problem: Pain Managment: Goal: General experience of comfort will improve Outcome: Progressing   Problem: Safety: Goal: Ability to remain free from injury will improve Outcome: Progressing   Problem: Skin Integrity: Goal: Risk for impaired skin integrity will decrease Outcome: Progressing   Problem: Education: Goal: Knowledge of disease and its progression will improve Outcome: Progressing Goal: Individualized Educational Video(s) Outcome: Progressing   Problem: Fluid Volume: Goal: Compliance with measures to maintain balanced fluid volume will improve Outcome: Progressing   Problem: Health Behavior/Discharge Planning: Goal: Ability to manage health-related needs will  improve Outcome: Progressing   Problem: Nutritional: Goal: Ability to make healthy dietary choices will improve Outcome: Progressing   Problem: Clinical Measurements: Goal: Complications related to the disease process, condition or treatment will be avoided or minimized Outcome: Progressing   Problem: Safety: Goal: Non-violent Restraint(s) Outcome: Progressing   

## 2021-01-25 NOTE — Progress Notes (Addendum)
Physical Therapy Treatment Patient Details Name: Alvin Clark MRN: [id number] DOB: Nov 02, 1960 Today's Date: 01/25/2021    History of Present Illness Pt admitted on 12/27/20 with Acute metabolic encephalopathy, Chronic evolving memory issues due to Chronic alcoholism. He had a fall out of bed on 3/11. Pt remains hospitalized due to homeless and difficult to place situation.    PT Comments    Pt ambulated 400' without an assistive device, no loss of balance. He reported dizziness with walking. He stated he does not feel dizzy when in bed, nor in sitting, nor with head turns in sitting. Assessed orthostatic vitals: supine BP 128/82, HR 88; sitting BP 141/88, HR 93; standing BP 123/83, HR 111. Pt stated dizziness with walking started 2 days ago, he thinks it's related to medication. Overall improved balance with walking today. Pt no longer requires SNF level of care, PT now recommending supervision for mobility.    Follow Up Recommendations  Supervision for mobility/OOB; likely no f/u PT needed     Equipment Recommendations  Rolling walker with 5" wheels    Recommendations for Other Services       Precautions / Restrictions Precautions Precautions: Fall Precaution Comments: incontinent (no incontinence 01/25/21) Restrictions Weight Bearing Restrictions: No    Mobility  Bed Mobility Overal bed mobility: Modified Independent       Supine to sit: Modified independent (Device/Increase time);HOB elevated Sit to supine: Modified independent (Device/Increase time);HOB elevated   General bed mobility comments: with use of rails    Transfers Overall transfer level: Needs assistance Equipment used: None Transfers: Sit to/from Stand Sit to Stand: Supervision         General transfer comment: supervision for safety  Ambulation/Gait Ambulation/Gait assistance: Supervision Gait Distance (Feet): 400 Feet Assistive device: None Gait Pattern/deviations: Step-through pattern;Decreased  stride length Gait velocity: WNL   General Gait Details: occaissional forward momentum but no tripping this session, no loss of balance, pt reported dizziness while walking, assessed orthostatic BP (see flowsheets), he denied dizziness with head turns   Optometrist    Modified Rankin (Stroke Patients Only)       Balance Overall balance assessment: History of Falls Sitting-balance support: Feet supported Sitting balance-Leahy Scale: Good       Standing balance-Leahy Scale: Good Standing balance comment: no loss of balance while ambulating today                            Cognition Arousal/Alertness: Awake/alert Behavior During Therapy: WFL for tasks assessed/performed Overall Cognitive Status: No family/caregiver present to determine baseline cognitive functioning Area of Impairment: Safety/judgement;Problem solving                         Safety/Judgement: Decreased awareness of safety   Problem Solving: Requires verbal cues General Comments: vague historian      Exercises      General Comments        Pertinent Vitals/Pain Faces Pain Scale: Hurts little more Pain Location: low back Pain Descriptors / Indicators: Aching Pain Intervention(s): Limited activity within patient's tolerance;Monitored during session;Patient requesting pain meds-RN notified    Home Living                      Prior Function            PT Goals (current goals can now  be found in the care plan section) Acute Rehab PT Goals Patient Stated Goal: Agreeable to work with PT PT Goal Formulation: With patient Time For Goal Achievement: 01/26/21 Potential to Achieve Goals: Good Progress towards PT goals: Progressing toward goals    Frequency    Min 2X/week      PT Plan Discharge plan needs to be updated    Co-evaluation              AM-PAC PT "6 Clicks" Mobility   Outcome Measure  Help needed turning  from your back to your side while in a flat bed without using bedrails?: None Help needed moving from lying on your back to sitting on the side of a flat bed without using bedrails?: None Help needed moving to and from a bed to a chair (including a wheelchair)?: A Little Help needed standing up from a chair using your arms (e.g., wheelchair or bedside chair)?: A Little Help needed to walk in hospital room?: A Little Help needed climbing 3-5 steps with a railing? : A Little 6 Click Score: 20    End of Session Equipment Utilized During Treatment: Gait belt Activity Tolerance: Patient tolerated treatment well Patient left: in bed;with call bell/phone within reach;with bed alarm set Nurse Communication: Mobility status;Patient requests pain meds PT Visit Diagnosis: Unsteadiness on feet (R26.81);Other abnormalities of gait and mobility (R26.89);History of falling (Z91.81)     Time: 4270-6237 PT Time Calculation (min) (ACUTE ONLY): 22 min  Charges:  $Gait Training: 8-22 mins                     Ralene Bathe Kistler PT 01/25/2021  Acute Rehabilitation Services Pager [phone] Office [phone]

## 2021-01-26 LAB — COMPREHENSIVE METABOLIC PANEL
ALT: 36 U/L (ref 0–44)
AST: 37 U/L (ref 15–41)
Albumin: 3.5 g/dL (ref 3.5–5.0)
Alkaline Phosphatase: 72 U/L (ref 38–126)
Anion gap: 8 (ref 5–15)
BUN: 18 mg/dL (ref 6–20)
CO2: 24 mmol/L (ref 22–32)
Calcium: 9.7 mg/dL (ref 8.9–10.3)
Chloride: 106 mmol/L (ref 98–111)
Creatinine, Ser: 0.98 mg/dL (ref 0.61–1.24)
GFR, Estimated: >60 mL/min (ref >60)
Glucose, Bld: 94 mg/dL (ref 70–99)
Potassium: 4.6 mmol/L (ref 3.5–5.1)
Sodium: 138 mmol/L (ref 135–145)
Total Bilirubin: 0.6 mg/dL (ref 0.3–1.2)
Total Protein: 7.6 g/dL (ref 6.5–8.1)

## 2021-01-26 LAB — CBC
HCT: 38.3 % — ABNORMAL LOW (ref 39.0–52.0)
Hemoglobin: 12 g/dL — ABNORMAL LOW (ref 13.0–17.0)
MCH: 32.1 pg (ref 26.0–34.0)
MCHC: 31.3 g/dL (ref 30.0–36.0)
MCV: 102.4 fL — ABNORMAL HIGH (ref 80.0–100.0)
Platelets: 231 10*3/uL (ref 150–400)
RBC: 3.74 MIL/uL — ABNORMAL LOW (ref 4.22–5.81)
RDW: 14.6 % (ref 11.5–15.5)
WBC: 10.6 10*3/uL — ABNORMAL HIGH (ref 4.0–10.5)
nRBC: 0 % (ref 0.0–0.2)

## 2021-01-26 LAB — MAGNESIUM: Magnesium: 1.8 mg/dL (ref 1.7–2.4)

## 2021-01-26 LAB — PHOSPHORUS: Phosphorus: 5.4 mg/dL — ABNORMAL HIGH (ref 2.5–4.6)

## 2021-01-26 NOTE — TOC Progression Note (Addendum)
Transition of Care [facility]) - Progression Note    Patient Details  Name: KEESHAWN FAKHOURI MRN: [id number] Date of Birth: May 14, 1961  Transition of Care [facility]) CM/SW Contact  Darleene Cleaver, [place] Phone Number: 01/26/2021, 5:10 PM  Clinical Narrative:     CSW attempted to contact [place], [facility], [employer], [place], [address], [place], and [employer] ALFs to see if they would be willing to review patient again since CSW has not heard back from them.  CSW had to leave messages awaiting for a call back.    CSW updated patient sister, per patient's sister she has not heard about social security or Medicaid approval yet.  Per patient's sister, patient's daughter was in the hospital for 1.5 moths, went home last week, but then was readmitted again.  Patient's sister nor his daughter can help take care of patient.  CSW to continue to follow patient's progress throughout discharge planning.   Expected Discharge Plan: Assisted Living (versus family care home) Barriers to Discharge: Financial Resources,Homeless with medical needs,Continued Medical Work up,No SNF bed,Requiring sitter/restraints,SNF Pending payor source - LOG,SNF Pending Medicaid,Family Issues  Expected Discharge Plan and Services Expected Discharge Plan: Assisted Living (versus family care home) In-house Referral: Clinical Social Work,Financial Counselor     Living arrangements for the past 2 months: No permanent address                                       Social Determinants of Health (SDOH) Interventions    Readmission Risk Interventions No flowsheet data found.

## 2021-01-26 NOTE — Progress Notes (Signed)
PROGRESS NOTE    Alvin Clark  KJZ:[id number] DOB: 05-11-1961 DOA: 12/27/2020 PCP: Patient, No Pcp Per (Inactive)  Chief Complaint  Patient presents with  . Altered Mental Status    Brief Narrative:  60 yo M with PMH of alcohol abuse and related cognitive decline brought to ED by EMS with altered mental status.  Reportedly trying to enter home that he was evicted from on 3/9, and homeowners called police when they found him disoriented and called EMS.  Patient was admitted for acute metabolic encephalopathy, AKI and significant electrolyte arrangements.  Encephalopathy work-up unrevealing.  Required safety sitter for long time due to high risk of fall as he tends to jump out of the bed unexpectedly.  Waiting on SNF bed.  Assessment & Plan:   Principal Problem:   AKI (acute kidney injury) (HCC) Active Problems:   Confusion   Leukocytosis   Blurry vision, bilateral   Transaminitis   Constipation   Withdrawal symptoms, alcohol (HCC)   Protein-calorie malnutrition, severe  Acute metabolic encephalopathy in patient with gradually evolving cognitive impairment.  Likely due to alcohol abuse and malnutrition.  Basic encephalopathy work-up including CT head, UDS, RPR (negative), TSH (wnl), and B12 unrevealing (low normal).  Ammonia elevated to 56>> 16. -stable today -Continue lactulose 20 g twice daily-hold if greater than 2 BMs in 24 hours. -Follow thiamine level (pending) -> high dose thiamine -Discontinue safety sitter and observe closely. -Continue Seroquel 25 mg twice daily with as needed Haldol -Monitor QTC-427 on 3/30 -Optimize K and Mg  AKI metabolic acidosis: Resolved.  Mild elevated LFT: due to EtOH abuse?  Acute hepatitis panel negative.  Improving. -Monitor intermittently -consider RUQ [place]  Macrocytic Anemia  Low Normal B12: Stable.  Anemia panel normal, though B12 is low normal -> supplement, follow MMA.  Chronic EtOH Abuse: Outside the window for withdrawal  symptoms. -Continue multivitamins, folic acid and thiamine  Hypokalemia/hypomagnesemia/hypophosphatemia: Refeeding syndrome?  Resolved. -Monitor intermittently  Impaired Mobility/debility/frequent fall -Therapy recommended SNF.  Difficult to place due to lack of insurance  Elevated BP/sinus tachycardia-improved with Coreg. -Continue Coreg.  Constipation -Continue scheduled lactulose  Severe malnutrition: As evidenced by significant muscle mass and subcu fat loss. Body mass index is 22.46 kg/m. Nutrition Problem: Severe Malnutrition Etiology: chronic illness (chronic alcoholism) Signs/Symptoms: severe fat depletion,severe muscle depletion Interventions: Ensure Enlive (each supplement provides 350kcal and 20 grams of protein),MVI  DVT prophylaxis: lovenox Code Status: full  Family Communication: none at bedside Disposition:   Status is: Inpatient  Remains inpatient appropriate because:Inpatient level of care appropriate due to severity of illness   Dispo: The patient is from: Home              Anticipated d/c is to: SNF              Patient currently is medically stable to d/c.   Difficult to place patient Yes       Consultants:   none  Procedures:  none  Antimicrobials:  Anti-infectives (From admission, onward)   None      Subjective: No new complaints  Objective: Vitals:   01/25/21 2016 01/26/21 0433 01/26/21 0435 01/26/21 1244  BP: 116/75 100/67  111/63  Pulse: 87 83  96  Resp: 17 16  20   Temp: 98.2 F (36.8 C) 98.2 F (36.8 C)  (!) 97.4 F (36.3 C)  TempSrc: Oral Oral  Oral  SpO2: 97% 97%  98%  Weight:   69.3 kg   Height:  Intake/Output Summary (Last 24 hours) at 01/26/2021 1846 Last data filed at 01/26/2021 1306 Gross per 24 hour  Intake 1655.79 ml  Output --  Net 1655.79 ml   Filed Weights   01/23/21 0500 01/25/21 0500 01/26/21 0435  Weight: 69 kg 70 kg 69.3 kg    Examination:  General: No acute  distress. Cardiovascular:RRR Lungs: unlabored Abdomen: Soft, nontender, nondistended Neurological: Alert. Moves all extremities 4 with equal strength. Cranial nerves II through XII grossly intact. Skin: Warm and dry. No rashes or lesions. Extremities: No clubbing or cyanosis. No edema.    Data Reviewed: I have personally reviewed following labs and imaging studies  CBC: Recent Labs  Lab 01/22/21 0300 01/24/21 0317 01/26/21 0331  WBC 10.1 9.8 10.6*  HGB 11.7* 12.1* 12.0*  HCT 36.6* 37.6* 38.3*  MCV 101.4* 102.5* 102.4*  PLT 254 234 231    Basic Metabolic Panel: Recent Labs  Lab 01/22/21 0300 01/24/21 0317 01/26/21 0331  NA 137 140 138  K 4.3 4.5 4.6  CL 102 104 106  CO2 25 27 24   GLUCOSE 118* 105* 94  BUN 19 19 18   CREATININE 0.92 1.01 0.98  CALCIUM 10.0 9.9 9.7  MG 1.8 1.9 1.8  PHOS 4.6 5.5* 5.4*    GFR: Estimated Creatinine Clearance: 79.6 mL/min (by C-G formula based on SCr of 0.98 mg/dL).  Liver Function Tests: Recent Labs  Lab 01/22/21 0300 01/24/21 0317 01/26/21 0331  AST 58* 45* 37  ALT 50* 42 36  ALKPHOS 90 85 72  BILITOT 0.7 0.4 0.6  PROT 8.0 7.8 7.6  ALBUMIN 3.7 3.6 3.5    CBG: No results for input(s): GLUCAP in the last 168 hours.   No results found for this or any previous visit (from the past 240 hour(s)).       Radiology Studies: No results found.      Scheduled Meds: . carvedilol  6.25 mg Oral BID WC  . enoxaparin (LOVENOX) injection  40 mg Subcutaneous Q24H  . feeding supplement  237 mL Oral TID BM  . folic acid  1 mg Oral Daily  . lactulose  20 g Oral BID  . multivitamin with minerals  1 tablet Oral Daily  . QUEtiapine  25 mg Oral BID  . sodium chloride flush  3 mL Intravenous Q12H  . [START ON 02/01/2021] thiamine  100 mg Oral Daily  . vitamin B-12  1,000 mcg Oral Daily   Continuous Infusions: . thiamine injection 500 mg (01/26/21 1344)   Followed by  . [START ON 01/27/2021] thiamine injection       LOS: 29  days    Time spent: over 30 min    03/28/21, MD [facility]   To contact the attending provider between 7A-7P or the covering provider during after hours 7P-7A, please log into the web site [email or web address] and access using universal [place] password for that web site. If you do not have the password, please call the hospital operator.  01/26/2021, 6:46 PM

## 2021-01-27 NOTE — Progress Notes (Signed)
PROGRESS NOTE    Alvin Clark  SHF:[id number] DOB: 1961-06-02 DOA: 12/27/2020 PCP: Patient, No Pcp Per (Inactive)  Chief Complaint  Patient presents with  . Altered Mental Status    Brief Narrative:  60 yo M with PMH of alcohol abuse and related cognitive decline brought to ED by EMS with altered mental status.  Reportedly trying to enter home that he was evicted from on 3/9, and homeowners called police when they found him disoriented and called EMS.  Patient was admitted for acute metabolic encephalopathy, AKI and significant electrolyte arrangements.  Encephalopathy work-up unrevealing.  Required safety sitter for long time due to high risk of fall as he tends to jump out of the bed unexpectedly.  Waiting on SNF bed.  Assessment & Plan:   Principal Problem:   AKI (acute kidney injury) (HCC) Active Problems:   Confusion   Leukocytosis   Blurry vision, bilateral   Transaminitis   Constipation   Withdrawal symptoms, alcohol (HCC)   Protein-calorie malnutrition, severe  Acute metabolic encephalopathy in patient with gradually evolving cognitive impairment.  Likely due to alcohol abuse and malnutrition.  Basic encephalopathy work-up including CT head, UDS, RPR (negative), TSH (wnl), and B12 unrevealing (low normal).  Ammonia elevated to 56>> 16. -stable today, Philemon Riedesel&Ox1  -Continue lactulose 20 g twice daily-hold if greater than 2 BMs in 24 hours. -Follow thiamine level (pending) -> high dose thiamine -Discontinue safety sitter and observe closely. -Continue Seroquel 25 mg twice daily with as needed Haldol -Monitor QTC-427 on 3/30 -Optimize K and Mg  AKI metabolic acidosis: Resolved.  Mild elevated LFT: due to EtOH abuse?  Acute hepatitis panel negative.  Improving. -Monitor intermittently -consider RUQ [place]  Macrocytic Anemia  Low Normal B12: Stable.  Anemia panel normal, though B12 is low normal -> supplement, follow MMA.  Chronic EtOH Abuse: Outside the window for withdrawal  symptoms. -Continue multivitamins, folic acid and thiamine  Hypokalemia/hypomagnesemia/hypophosphatemia: Refeeding syndrome?  Resolved. -Monitor intermittently  Impaired Mobility/debility/frequent fall -Therapy recommended SNF.  Difficult to place due to lack of insurance  Elevated BP/sinus tachycardia-improved with Coreg. -Continue Coreg.  Constipation -Continue scheduled lactulose  Severe malnutrition: As evidenced by significant muscle mass and subcu fat loss. Body mass index is 22.46 kg/m. Nutrition Problem: Severe Malnutrition Etiology: chronic illness (chronic alcoholism) Signs/Symptoms: severe fat depletion,severe muscle depletion Interventions: Ensure Enlive (each supplement provides 350kcal and 20 grams of protein),MVI  DVT prophylaxis: lovenox Code Status: full  Family Communication: none at bedside Disposition:   Status is: Inpatient  Remains inpatient appropriate because:Inpatient level of care appropriate due to severity of illness   Dispo: The patient is from: Home              Anticipated d/c is to: SNF              Patient currently is medically stable to d/c.   Difficult to place patient Yes       Consultants:   none  Procedures:  none  Antimicrobials:  Anti-infectives (From admission, onward)   None      Subjective: No new complaints Thinks he's by mall, doesn't know month   Objective: Vitals:   01/26/21 1244 01/26/21 2024 01/27/21 0427 01/27/21 0806  BP: 111/63 110/72 107/76 116/78  Pulse: 96 92 84 90  Resp: 20 20 (!) 24   Temp: (!) 97.4 F (36.3 C) (!) 97.5 F (36.4 C) 98.1 F (36.7 C)   TempSrc: Oral Oral Oral   SpO2: 98% 98% 96%  Weight:      Height:        Intake/Output Summary (Last 24 hours) at 01/27/2021 0921 Last data filed at 01/27/2021 0700 Gross per 24 hour  Intake 823 ml  Output --  Net 823 ml   Filed Weights   01/23/21 0500 01/25/21 0500 01/26/21 0435  Weight: 69 kg 70 kg 69.3 kg     Examination:  General: No acute distress. Cardiovascular:RRR Lungs: unlabored Abdomen: Soft, nontender, nondistended  Neurological: Alert and oriented 1. Moves all extremities 4. Cranial nerves II through XII grossly intact. Skin: Warm and dry. No rashes or lesions. Extremities: No clubbing or cyanosis. No edema.    Data Reviewed: I have personally reviewed following labs and imaging studies  CBC: Recent Labs  Lab 01/22/21 0300 01/24/21 0317 01/26/21 0331  WBC 10.1 9.8 10.6*  HGB 11.7* 12.1* 12.0*  HCT 36.6* 37.6* 38.3*  MCV 101.4* 102.5* 102.4*  PLT 254 234 231    Basic Metabolic Panel: Recent Labs  Lab 01/22/21 0300 01/24/21 0317 01/26/21 0331  NA 137 140 138  K 4.3 4.5 4.6  CL 102 104 106  CO2 25 27 24   GLUCOSE 118* 105* 94  BUN 19 19 18   CREATININE 0.92 1.01 0.98  CALCIUM 10.0 9.9 9.7  MG 1.8 1.9 1.8  PHOS 4.6 5.5* 5.4*    GFR: Estimated Creatinine Clearance: 79.6 mL/min (by C-G formula based on SCr of 0.98 mg/dL).  Liver Function Tests: Recent Labs  Lab 01/22/21 0300 01/24/21 0317 01/26/21 0331  AST 58* 45* 37  ALT 50* 42 36  ALKPHOS 90 85 72  BILITOT 0.7 0.4 0.6  PROT 8.0 7.8 7.6  ALBUMIN 3.7 3.6 3.5    CBG: No results for input(s): GLUCAP in the last 168 hours.   No results found for this or any previous visit (from the past 240 hour(s)).       Radiology Studies: No results found.      Scheduled Meds: . carvedilol  6.25 mg Oral BID WC  . enoxaparin (LOVENOX) injection  40 mg Subcutaneous Q24H  . feeding supplement  237 mL Oral TID BM  . folic acid  1 mg Oral Daily  . lactulose  20 g Oral BID  . multivitamin with minerals  1 tablet Oral Daily  . QUEtiapine  25 mg Oral BID  . sodium chloride flush  3 mL Intravenous Q12H  . [START ON 02/01/2021] thiamine  100 mg Oral Daily  . vitamin B-12  1,000 mcg Oral Daily   Continuous Infusions: . thiamine injection       LOS: 30 days    Time spent: over 30  min    03/28/21, MD [facility]   To contact the attending provider between 7A-7P or the covering provider during after hours 7P-7A, please log into the web site [email or web address] and access using universal [place] password for that web site. If you do not have the password, please call the hospital operator.  01/27/2021, 9:21 AM

## 2021-01-28 NOTE — Progress Notes (Signed)
PROGRESS NOTE    Alvin Clark  NLG:[id number] DOB: [birth year] DOA: 12/27/2020 PCP: Patient, No Pcp Per (Inactive)  Chief Complaint  Patient presents with  . Altered Mental Status    Brief Narrative:  60 yo M with PMH of alcohol abuse and related cognitive decline brought to ED by EMS with altered mental status.  Reportedly trying to enter home that he was evicted from on 3/9, and homeowners called police when they found him disoriented and called EMS.  Patient was admitted for acute metabolic encephalopathy, AKI and significant electrolyte arrangements.  Encephalopathy work-up unrevealing.  Required safety sitter for Clark time due to high risk of fall as he tends to jump out of the bed unexpectedly.  Waiting on SNF bed.  Assessment & Plan:   Principal Problem:   AKI (acute kidney injury) (HCC) Active Problems:   Confusion   Leukocytosis   Blurry vision, bilateral   Transaminitis   Constipation   Withdrawal symptoms, alcohol (HCC)   Protein-calorie malnutrition, severe  Acute metabolic encephalopathy in patient with gradually evolving cognitive impairment.  Likely due to alcohol abuse and malnutrition.  Basic encephalopathy work-up including CT head, UDS, RPR (negative), TSH (wnl), and B12 unrevealing (low normal).  Ammonia elevated to 56>> 16. -stable today, Alvin Clark&Ox1, stable -Continue lactulose 20 g twice daily-hold if greater than 2 BMs in 24 hours. -Follow thiamine level (pending) -> high dose thiamine -Discontinue safety sitter and observe closely. -Continue Seroquel 25 mg twice daily with as needed Haldol -Monitor QTC-427 on 3/30 -Optimize K and Mg  AKI metabolic acidosis: Resolved.  Mild elevated LFT: due to EtOH abuse?  Acute hepatitis panel negative.  Improving. -Monitor intermittently -consider RUQ [place]  Macrocytic Anemia  Low Normal B12: Stable.  Anemia panel normal, though B12 is low normal -> supplement, follow MMA.  Chronic EtOH Abuse: Outside the window for  withdrawal symptoms. -Continue multivitamins, folic acid and thiamine  Hypokalemia/hypomagnesemia/hypophosphatemia: Refeeding syndrome?  Resolved. -Monitor intermittently  Impaired Mobility/debility/frequent fall -Therapy recommended SNF.  Difficult to place due to lack of insurance  Elevated BP/sinus tachycardia-improved with Coreg. -Continue Coreg.  Constipation -Continue scheduled lactulose  Severe malnutrition: As evidenced by significant muscle mass and subcu fat loss. Body mass index is 22.46 kg/m. Nutrition Problem: Severe Malnutrition Etiology: chronic illness (chronic alcoholism) Signs/Symptoms: severe fat depletion,severe muscle depletion Interventions: Ensure Enlive (each supplement provides 350kcal and 20 grams of protein),MVI  DVT prophylaxis: lovenox Code Status: full  Family Communication: none at bedside Disposition:   Status is: Inpatient  Remains inpatient appropriate because:Inpatient level of care appropriate due to severity of illness   Dispo: The patient is from: Home              Anticipated d/c is to: SNF              Patient currently is medically stable to d/c.   Difficult to place patient Yes       Consultants:   none  Procedures:  none  Antimicrobials:  Anti-infectives (From admission, onward)   None      Subjective: No complaints Says "Alvin Clark", doesn't know month  Objective: Vitals:   01/28/21 0452 01/28/21 0500 01/28/21 0756 01/28/21 1326  BP: 113/77  110/77 110/74  Pulse: 88  88 92  Resp: 16   16  Temp: 97.6 F (36.4 C)   98.3 F (36.8 C)  TempSrc: Oral     SpO2: 96%   98%  Weight:  70.1 kg    Height:  Intake/Output Summary (Last 24 hours) at 01/28/2021 1339 Last data filed at 01/28/2021 1212 Gross per 24 hour  Intake 720 ml  Output --  Net 720 ml   Filed Weights   01/25/21 0500 01/26/21 0435 01/28/21 0500  Weight: 70 kg 69.3 kg 70.1 kg    Examination:  General: No acute  distress. Cardiovascular: Heart sounds show Alvin Clark regular rate, and rhythm.  Lungs: Clear to auscultation bilaterally  Abdomen: Soft, nontender, nondistended  Neurological: Alert and oriented 1. Moves all extremities 4. Cranial nerves II through XII grossly intact. Skin: Warm and dry. No rashes or lesions. Extremities: No clubbing or cyanosis. No edema.   Data Reviewed: I have personally reviewed following labs and imaging studies  CBC: Recent Labs  Lab 01/22/21 0300 01/24/21 0317 01/26/21 0331  WBC 10.1 9.8 10.6*  HGB 11.7* 12.1* 12.0*  HCT 36.6* 37.6* 38.3*  MCV 101.4* 102.5* 102.4*  PLT 254 234 231    Basic Metabolic Panel: Recent Labs  Lab 01/22/21 0300 01/24/21 0317 01/26/21 0331  NA 137 140 138  K 4.3 4.5 4.6  CL 102 104 106  CO2 25 27 24   GLUCOSE 118* 105* 94  BUN 19 19 18   CREATININE 0.92 1.01 0.98  CALCIUM 10.0 9.9 9.7  MG 1.8 1.9 1.8  PHOS 4.6 5.5* 5.4*    GFR: Estimated Creatinine Clearance: 80.5 mL/min (by C-G formula based on SCr of 0.98 mg/dL).  Liver Function Tests: Recent Labs  Lab 01/22/21 0300 01/24/21 0317 01/26/21 0331  AST 58* 45* 37  ALT 50* 42 36  ALKPHOS 90 85 72  BILITOT 0.7 0.4 0.6  PROT 8.0 7.8 7.6  ALBUMIN 3.7 3.6 3.5    CBG: No results for input(s): GLUCAP in the last 168 hours.   No results found for this or any previous visit (from the past 240 hour(s)).       Radiology Studies: No results found.      Scheduled Meds: . carvedilol  6.25 mg Oral BID WC  . enoxaparin (LOVENOX) injection  40 mg Subcutaneous Q24H  . feeding supplement  237 mL Oral TID BM  . folic acid  1 mg Oral Daily  . lactulose  20 g Oral BID  . multivitamin with minerals  1 tablet Oral Daily  . QUEtiapine  25 mg Oral BID  . sodium chloride flush  3 mL Intravenous Q12H  . [START ON 02/01/2021] thiamine  100 mg Oral Daily  . vitamin B-12  1,000 mcg Oral Daily   Continuous Infusions: . thiamine injection 250 mg (01/28/21 1047)     LOS:  31 days    Time spent: over 30 min    02/03/2021, MD [facility]   To contact the attending provider between 7A-7P or the covering provider during after hours 7P-7A, please log into the web site [email or web address] and access using universal [place] password for that web site. If you do not have the password, please call the hospital operator.  01/28/2021, 1:39 PM

## 2021-01-29 LAB — COMPREHENSIVE METABOLIC PANEL
ALT: 28 U/L (ref 0–44)
AST: 34 U/L (ref 15–41)
Albumin: 3.7 g/dL (ref 3.5–5.0)
Alkaline Phosphatase: 77 U/L (ref 38–126)
Anion gap: 10 (ref 5–15)
BUN: 17 mg/dL (ref 6–20)
CO2: 23 mmol/L (ref 22–32)
Calcium: 9.8 mg/dL (ref 8.9–10.3)
Chloride: 104 mmol/L (ref 98–111)
Creatinine, Ser: 0.86 mg/dL (ref 0.61–1.24)
GFR, Estimated: >60 mL/min (ref >60)
Glucose, Bld: 92 mg/dL (ref 70–99)
Potassium: 4.3 mmol/L (ref 3.5–5.1)
Sodium: 137 mmol/L (ref 135–145)
Total Bilirubin: 0.8 mg/dL (ref 0.3–1.2)
Total Protein: 7.8 g/dL (ref 6.5–8.1)

## 2021-01-29 LAB — PHOSPHORUS: Phosphorus: 5 mg/dL — ABNORMAL HIGH (ref 2.5–4.6)

## 2021-01-29 LAB — MAGNESIUM: Magnesium: 1.8 mg/dL (ref 1.7–2.4)

## 2021-01-29 NOTE — Progress Notes (Signed)
Physical Therapy Treatment Patient Details Name: Alvin Clark MRN: [id number] DOB: [birth year] Today's Date: 01/29/2021    History of Present Illness Pt admitted on 12/27/20 with Acute metabolic encephalopathy, Chronic evolving memory issues due to Chronic alcoholism. He had a fall out of bed on 3/11. Pt remains hospitalized due to homeless and difficult to place situation.    PT Comments    Pt ambulated 400' without an assistive device, no loss of balance. Instructed pt in seated BUE/LE exercises to be done independently for strengthening. Pt has met PT goals, will sign off.   Follow Up Recommendations  Supervision for mobility/OOB     Equipment Recommendations  Rolling walker with 5" wheels    Recommendations for Other Services       Precautions / Restrictions Precautions Precautions: Fall Precaution Comments: denies h/o falls in the past 6 months Restrictions Weight Bearing Restrictions: No    Mobility  Bed Mobility Overal bed mobility: Independent       Supine to sit: Independent          Transfers Overall transfer level: Independent Equipment used: None   Sit to Stand: Independent            Ambulation/Gait   Gait Distance (Feet): 400 Feet Assistive device: None Gait Pattern/deviations: Trunk flexed;WFL(Within Functional Limits) Gait velocity: WNL   General Gait Details: no tripping nor loss of balance, no dizziness, some brief episodes of forward momentum   Stairs             Wheelchair Mobility    Modified Rankin (Stroke Patients Only)       Balance Overall balance assessment: History of Falls Sitting-balance support: Feet supported Sitting balance-Leahy Scale: Good       Standing balance-Leahy Scale: Good Standing balance comment: no loss of balance while ambulating today                            Cognition Arousal/Alertness: Awake/alert Behavior During Therapy: WFL for tasks assessed/performed Overall  Cognitive Status: No family/caregiver present to determine baseline cognitive functioning                                 General Comments: vague historian, pleasant and is able to follow commands      Exercises General Exercises - Lower Extremity Ankle Circles/Pumps: AROM;Both;10 reps;Seated Long Arc Quad: Seated;AROM;Strengthening;Both;10 reps Hip Flexion/Marching: Standing;AROM;Strengthening;Both;10 reps  Shoulder flexion: seated AROM both; 10 reps    General Comments        Pertinent Vitals/Pain Pain Assessment: No/denies pain    Home Living                      Prior Function            PT Goals (current goals can now be found in the care plan section) Acute Rehab PT Goals Patient Stated Goal: Agreeable to work with PT PT Goal Formulation: With patient Time For Goal Achievement: 01/26/21 Potential to Achieve Goals: Good Progress towards PT goals: Goals met/education completed, patient discharged from PT    Frequency    Min 2X/week      PT Plan Current plan remains appropriate    Co-evaluation              AM-PAC PT "6 Clicks" Mobility   Outcome Measure  Help needed turning from your back to your  side while in a flat bed without using bedrails?: None Help needed moving from lying on your back to sitting on the side of a flat bed without using bedrails?: None Help needed moving to and from a bed to a chair (including a wheelchair)?: None Help needed standing up from a chair using your arms (e.g., wheelchair or bedside chair)?: None Help needed to walk in hospital room?: None Help needed climbing 3-5 steps with a railing? : None 6 Click Score: 24    End of Session Equipment Utilized During Treatment: Gait belt Activity Tolerance: Patient tolerated treatment well Patient left: in bed;with call bell/phone within reach;with bed alarm set Nurse Communication: Mobility status PT Visit Diagnosis: Unsteadiness on feet  (R26.81);Other abnormalities of gait and mobility (R26.89);History of falling (Z91.81)     Time: 9444-6190 PT Time Calculation (min) (ACUTE ONLY): 11 min  Charges:  $Gait Training: 8-22 mins                     Blondell Reveal Kistler PT 01/29/2021  Acute Rehabilitation Services Pager [phone] Office [phone]

## 2021-01-29 NOTE — Progress Notes (Signed)
PROGRESS NOTE    Alvin Clark  [id number]:[id number] DOB: 27-Jul-1961 DOA: 12/27/2020 PCP: Patient, No Pcp Per (Inactive)  Chief Complaint  Patient presents with  . Altered Mental Status    Brief Narrative:  60 yo M with PMH of alcohol abuse and related cognitive decline brought to ED by EMS with altered mental status.  Reportedly trying to enter home that he was evicted from on 3/9, and homeowners called police when they found him disoriented and called EMS.  Patient was admitted for acute metabolic encephalopathy, AKI and significant electrolyte arrangements.  Encephalopathy work-up unrevealing.  Required safety sitter for long time due to high risk of fall as he tends to jump out of the bed unexpectedly.  Pending placement.   Assessment & Plan:   Principal Problem:   AKI (acute kidney injury) (HCC) Active Problems:   Confusion   Leukocytosis   Blurry vision, bilateral   Transaminitis   Constipation   Withdrawal symptoms, alcohol (HCC)   Protein-calorie malnutrition, severe  Acute metabolic encephalopathy in patient with gradually evolving cognitive impairment.  Likely due to alcohol abuse and malnutrition.  Basic encephalopathy work-up including CT head, UDS, RPR (negative), TSH (wnl), and B12 unrevealing (low normal).  Ammonia elevated to 56>> 16. -Kimblery Diop&Ox3, stable to slightly improved -Continue lactulose 20 g twice daily-hold if greater than 2 BMs in 24 hours. -Follow thiamine level (pending) -> high dose thiamine -Discontinue safety sitter and observe closely. -Continue Seroquel 25 mg twice daily with as needed Haldol -Monitor QTC-427 on 3/30 -Optimize K and Mg  AKI metabolic acidosis: Resolved.  Mild elevated LFT: due to EtOH abuse?  Acute hepatitis panel negative.  Improving. -Monitor intermittently -consider RUQ [place]  Macrocytic Anemia  Low Normal B12: Stable.  Anemia panel normal, though B12 is low normal -> supplement, follow MMA.  Chronic EtOH Abuse: Outside the window  for withdrawal symptoms. -Continue multivitamins, folic acid and thiamine  Hypokalemia/hypomagnesemia/hypophosphatemia: Refeeding syndrome?  Resolved. -Monitor intermittently  Impaired Mobility/debility/frequent fall -Therapy recommended SNF.  Difficult to place due to lack of insurance  Elevated BP/sinus tachycardia-improved with Coreg. -Continue Coreg.  Constipation -Continue scheduled lactulose  Severe malnutrition: As evidenced by significant muscle mass and subcu fat loss. Body mass index is 22.46 kg/m. Nutrition Problem: Severe Malnutrition Etiology: chronic illness (chronic alcoholism) Signs/Symptoms: severe fat depletion,severe muscle depletion Interventions: Ensure Enlive (each supplement provides 350kcal and 20 grams of protein),MVI  DVT prophylaxis: lovenox Code Status: full  Family Communication: none at bedside Disposition:   Status is: Inpatient  Remains inpatient appropriate because:Inpatient level of care appropriate due to severity of illness   Dispo: The patient is from: Home              Anticipated d/c is to: SNF              Patient currently is medically stable to d/c.   Difficult to place patient Yes       Consultants:   none  Procedures:  none  Antimicrobials:  Anti-infectives (From admission, onward)   None      Subjective: No complaints today, Kenzli Barritt&OX3 Objective: Vitals:   01/28/21 1651 01/28/21 2036 01/29/21 0540 01/29/21 0808  BP: 135/86 117/81 94/62 116/72  Pulse: 83 79 81 91  Resp:  16 18   Temp:  98.3 F (36.8 C) 99 F (37.2 C)   TempSrc:  Oral    SpO2:  98% 99%   Weight:   70.3 kg   Height:  Intake/Output Summary (Last 24 hours) at 01/29/2021 1305 Last data filed at 01/29/2021 0840 Gross per 24 hour  Intake 595 ml  Output --  Net 595 ml   Filed Weights   01/26/21 0435 01/28/21 0500 01/29/21 0540  Weight: 69.3 kg 70.1 kg 70.3 kg    Examination:  General: No acute distress. Cardiovascular: Heart  sounds show Jaykob Minichiello regular rate, and rhythm. Lungs: Clear to auscultation bilaterally with good air movement. No rales, rhonchi or wheezes. Abdomen: Soft, nontender, nondistended with normal active bowel sounds. No masses. No hepatosplenomegaly. Neurological: Alert and oriented 3. Moves all extremities 4 with equal strength. Cranial nerves II through XII grossly intact. Skin: Warm and dry. No rashes or lesions. Extremities: No clubbing or cyanosis. No edema.   Data Reviewed: I have personally reviewed following labs and imaging studies  CBC: Recent Labs  Lab 01/24/21 0317 01/26/21 0331  WBC 9.8 10.6*  HGB 12.1* 12.0*  HCT 37.6* 38.3*  MCV 102.5* 102.4*  PLT 234 231    Basic Metabolic Panel: Recent Labs  Lab 01/24/21 0317 01/26/21 0331 01/29/21 0742  NA 140 138 137  K 4.5 4.6 4.3  CL 104 106 104  CO2 27 24 23   GLUCOSE 105* 94 92  BUN 19 18 17   CREATININE 1.01 0.98 0.86  CALCIUM 9.9 9.7 9.8  MG 1.9 1.8 1.8  PHOS 5.5* 5.4* 5.0*    GFR: Estimated Creatinine Clearance: 92 mL/min (by C-G formula based on SCr of 0.86 mg/dL).  Liver Function Tests: Recent Labs  Lab 01/24/21 0317 01/26/21 0331 01/29/21 0742  AST 45* 37 34  ALT 42 36 28  ALKPHOS 85 72 77  BILITOT 0.4 0.6 0.8  PROT 7.8 7.6 7.8  ALBUMIN 3.6 3.5 3.7    CBG: No results for input(s): GLUCAP in the last 168 hours.   No results found for this or any previous visit (from the past 240 hour(s)).       Radiology Studies: No results found.      Scheduled Meds: . carvedilol  6.25 mg Oral BID WC  . enoxaparin (LOVENOX) injection  40 mg Subcutaneous Q24H  . feeding supplement  237 mL Oral TID BM  . folic acid  1 mg Oral Daily  . lactulose  20 g Oral BID  . multivitamin with minerals  1 tablet Oral Daily  . QUEtiapine  25 mg Oral BID  . sodium chloride flush  3 mL Intravenous Q12H  . [START ON 02/01/2021] thiamine  100 mg Oral Daily  . vitamin B-12  1,000 mcg Oral Daily   Continuous  Infusions: . thiamine injection 250 mg (01/29/21 1059)     LOS: 32 days    Time spent: over 30 min    02/03/2021, MD [facility]   To contact the attending provider between 7A-7P or the covering provider during after hours 7P-7A, please log into the web site [email or web address] and access using universal [place] password for that web site. If you do not have the password, please call the hospital operator.  01/29/2021, 1:05 PM

## 2021-01-30 LAB — METHYLMALONIC ACID, SERUM: Methylmalonic Acid, Quantitative: 313 nmol/L (ref 0–378)

## 2021-01-30 LAB — VITAMIN B1: Vitamin B1 (Thiamine): 170.2 nmol/L (ref 66.5–200.0)

## 2021-01-30 NOTE — Progress Notes (Signed)
PROGRESS NOTE    EYOEL THROGMORTON  CBJ:[id number] DOB: 03/09/1961 DOA: 12/27/2020 PCP: Patient, No Pcp Per (Inactive)  Chief Complaint  Patient presents with  . Altered Mental Status    Brief Narrative:  60 yo M with PMH of alcohol abuse and related cognitive decline brought to ED by EMS with altered mental status.  Reportedly trying to enter home that he was evicted from on 3/9, and homeowners called police when they found him disoriented and called EMS.  Patient was admitted for acute metabolic encephalopathy, AKI and significant electrolyte arrangements.  Encephalopathy work-up unrevealing.  Required safety sitter for long time due to high risk of fall as he tends to jump out of the bed unexpectedly.  Pending placement, difficult to place.   Assessment & Plan:   Principal Problem:   AKI (acute kidney injury) (HCC) Active Problems:   Confusion   Leukocytosis   Blurry vision, bilateral   Transaminitis   Constipation   Withdrawal symptoms, alcohol (HCC)   Protein-calorie malnutrition, severe  Acute metabolic encephalopathy in patient with gradually evolving cognitive impairment.  Likely due to alcohol abuse and malnutrition.  Basic encephalopathy work-up including CT head, UDS, RPR (negative), TSH (wnl), and B12 unrevealing (low normal).  Ammonia elevated to 56>> 16. -[place]&Ox3 recently, stable to slightly improved -Continue lactulose 20 g twice daily-hold if greater than 2 BMs in 24 hours. -Follow thiamine level (pending) -> high dose thiamine -Discontinue safety sitter and observe closely. -Continue Seroquel 25 mg twice daily with as needed Haldol -Monitor QTC-427 on 3/30 -Optimize K and Mg  AKI metabolic acidosis: Resolved.  Mild elevated LFT: due to EtOH abuse?  Acute hepatitis panel negative.  Improving. -Monitor intermittently -consider RUQ [place]  Macrocytic Anemia  Low Normal B12: Stable.  Anemia panel normal, though B12 is low normal -> supplement, follow MMA.  Chronic EtOH  Abuse: Outside the window for withdrawal symptoms. -Continue multivitamins, folic acid and thiamine  Hypokalemia/hypomagnesemia/hypophosphatemia: Refeeding syndrome?  Resolved. -Monitor intermittently  Impaired Mobility/debility/frequent fall -Therapy recommended SNF.  Difficult to place due to lack of insurance  Elevated BP/sinus tachycardia-improved with Coreg. -Continue Coreg.  Constipation -Continue scheduled lactulose  Severe malnutrition: As evidenced by significant muscle mass and subcu fat loss. Body mass index is 22.46 kg/m. Nutrition Problem: Severe Malnutrition Etiology: chronic illness (chronic alcoholism) Signs/Symptoms: severe fat depletion,severe muscle depletion Interventions: Ensure Enlive (each supplement provides 350kcal and 20 grams of protein),MVI  DVT prophylaxis: lovenox Code Status: full  Family Communication: none at bedside Disposition:   Status is: Inpatient  Remains inpatient appropriate because:Inpatient level of care appropriate due to severity of illness   Dispo: The patient is from: Home              Anticipated d/c is to: SNF              Patient currently is medically stable to d/c.   Difficult to place patient Yes       Consultants:   none  Procedures:  none  Antimicrobials:  Anti-infectives (From admission, onward)   None      Subjective: No new complaints  Objective: Vitals:   01/29/21 1608 01/29/21 2013 01/30/21 0437 01/30/21 0449  BP: 112/83 102/67 110/81   Pulse: 100 97 96   Resp:  17 17   Temp:  98.4 F (36.9 C) 98 F (36.7 C)   TempSrc:  Oral Oral   SpO2:  98% 97%   Weight:    70 kg  Height:  Intake/Output Summary (Last 24 hours) at 01/30/2021 1404 Last data filed at 01/30/2021 0853 Gross per 24 hour  Intake 1187 ml  Output --  Net 1187 ml   Filed Weights   01/28/21 0500 01/29/21 0540 01/30/21 0449  Weight: 70.1 kg 70.3 kg 70 kg    Examination:  General: No acute distress. Eating  lunch Cardiovascular: RRR Lungs: unlabored Abdomen: Soft, nontender, nondistended Neurological: Alert. Moves all extremities 4. Cranial nerves II through XII grossly intact. Skin: Warm and dry. No rashes or lesions. Extremities: No clubbing or cyanosis. No edema.   Data Reviewed: I have personally reviewed following labs and imaging studies  CBC: Recent Labs  Lab 01/24/21 0317 01/26/21 0331  WBC 9.8 10.6*  HGB 12.1* 12.0*  HCT 37.6* 38.3*  MCV 102.5* 102.4*  PLT 234 231    Basic Metabolic Panel: Recent Labs  Lab 01/24/21 0317 01/26/21 0331 01/29/21 0742  NA 140 138 137  K 4.5 4.6 4.3  CL 104 106 104  CO2 27 24 23   GLUCOSE 105* 94 92  BUN 19 18 17   CREATININE 1.01 0.98 0.86  CALCIUM 9.9 9.7 9.8  MG 1.9 1.8 1.8  PHOS 5.5* 5.4* 5.0*    GFR: Estimated Creatinine Clearance: 91.6 mL/min (by C-G formula based on SCr of 0.86 mg/dL).  Liver Function Tests: Recent Labs  Lab 01/24/21 0317 01/26/21 0331 01/29/21 0742  AST 45* 37 34  ALT 42 36 28  ALKPHOS 85 72 77  BILITOT 0.4 0.6 0.8  PROT 7.8 7.6 7.8  ALBUMIN 3.6 3.5 3.7    CBG: No results for input(s): GLUCAP in the last 168 hours.   No results found for this or any previous visit (from the past 240 hour(s)).       Radiology Studies: No results found.      Scheduled Meds: . carvedilol  6.25 mg Oral BID WC  . enoxaparin (LOVENOX) injection  40 mg Subcutaneous Q24H  . feeding supplement  237 mL Oral TID BM  . folic acid  1 mg Oral Daily  . lactulose  20 g Oral BID  . multivitamin with minerals  1 tablet Oral Daily  . QUEtiapine  25 mg Oral BID  . sodium chloride flush  3 mL Intravenous Q12H  . [START ON 02/01/2021] thiamine  100 mg Oral Daily  . vitamin B-12  1,000 mcg Oral Daily   Continuous Infusions: . thiamine injection 250 mg (01/30/21 1024)     LOS: 33 days    Time spent: over 30 min    02/03/2021, MD [facility]   To contact the attending provider between  7A-7P or the covering provider during after hours 7P-7A, please log into the web site [email or web address] and access using universal [place] password for that web site. If you do not have the password, please call the hospital operator.  01/30/2021, 2:04 PM

## 2021-01-31 DIAGNOSIS — R5381 Other malaise: Secondary | ICD-10-CM

## 2021-01-31 DIAGNOSIS — E876 Hypokalemia: Secondary | ICD-10-CM

## 2021-01-31 DIAGNOSIS — E538 Deficiency of other specified B group vitamins: Secondary | ICD-10-CM

## 2021-01-31 DIAGNOSIS — F1090 Alcohol use, unspecified, uncomplicated: Secondary | ICD-10-CM

## 2021-01-31 DIAGNOSIS — G9341 Metabolic encephalopathy: Secondary | ICD-10-CM

## 2021-01-31 DIAGNOSIS — Z7289 Other problems related to lifestyle: Secondary | ICD-10-CM

## 2021-01-31 DIAGNOSIS — W19XXXD Unspecified fall, subsequent encounter: Secondary | ICD-10-CM

## 2021-01-31 DIAGNOSIS — W19XXXA Unspecified fall, initial encounter: Secondary | ICD-10-CM

## 2021-01-31 LAB — COMPREHENSIVE METABOLIC PANEL
ALT: 24 U/L (ref 0–44)
AST: 30 U/L (ref 15–41)
Albumin: 3.7 g/dL (ref 3.5–5.0)
Alkaline Phosphatase: 70 U/L (ref 38–126)
Anion gap: 9 (ref 5–15)
BUN: 16 mg/dL (ref 6–20)
CO2: 25 mmol/L (ref 22–32)
Calcium: 9.7 mg/dL (ref 8.9–10.3)
Chloride: 104 mmol/L (ref 98–111)
Creatinine, Ser: 0.81 mg/dL (ref 0.61–1.24)
GFR, Estimated: >60 mL/min (ref >60)
Glucose, Bld: 89 mg/dL (ref 70–99)
Potassium: 3.9 mmol/L (ref 3.5–5.1)
Sodium: 138 mmol/L (ref 135–145)
Total Bilirubin: 0.9 mg/dL (ref 0.3–1.2)
Total Protein: 7.6 g/dL (ref 6.5–8.1)

## 2021-01-31 LAB — PHOSPHORUS: Phosphorus: 4.8 mg/dL — ABNORMAL HIGH (ref 2.5–4.6)

## 2021-01-31 LAB — MAGNESIUM: Magnesium: 1.7 mg/dL (ref 1.7–2.4)

## 2021-01-31 MED ORDER — MAGNESIUM SULFATE 4 GM/100ML IV SOLN
4.0000 g | Freq: Once | INTRAVENOUS | Status: AC
  Administered 2021-01-31: 4 g via INTRAVENOUS
  Filled 2021-01-31: qty 100

## 2021-01-31 NOTE — Progress Notes (Signed)
PROGRESS NOTE    Alvin Clark  IOX:[id number] DOB: October 21, 1961 DOA: 12/27/2020 PCP: Patient, No Pcp Per (Inactive)    Chief Complaint  Patient presents with  . Altered Mental Status    Brief Narrative:  60 yo M with PMH of alcohol abuse and related cognitive decline brought to ED by EMS with altered mental status. Reportedly trying to enter home that he was evicted from on 3/9, and homeowners called police when they found him disoriented and called EMS. Patient was admitted for acute metabolic encephalopathy, AKI and significant electrolyte arrangements. Encephalopathy work-up unrevealing. Required safety sitter for long time due to high risk of fall as he tends to jump out of the bed unexpectedly. Pending placement, difficult to place.    Assessment & Plan:   Principal Problem:   AKI (acute kidney injury) (HCC) Active Problems:   Confusion   Leukocytosis   Blurry vision, bilateral   Transaminitis   Constipation   Withdrawal symptoms, alcohol (HCC)   Protein-calorie malnutrition, severe   Acute metabolic encephalopathy   Hypokalemia   Hypomagnesemia   Hypophosphatemia   Fall   Debility   Low vitamin B12 level   Chronic alcohol use  1 acute metabolic encephalopathy in patient with gradually evolving cognitive impairment -Likely as a result of alcohol abuse and malnutrition. -Work-up negative to date including head CT which was negative for any acute abnormalities, UDS negative, RPR negative, TSH within normal limits, vitamin B12 low normal.  Ammonia level was elevated at 56>>> 16. -Patient improved clinically. -Alert and oriented to self and place.  Thinks is 2004, thinks it is March.  No focal deficits. -Continue current regimen of high-dose thiamine, lactulose 20 g twice daily and hold if greater than 2 bowel movements in a 24-hour period. -Continue Seroquel 25 mg twice daily, Haldol as needed. -Replete electrolytes keep potassium approximately 4, magnesium  approximately 2. -Safety sitter has been discontinued.  2.  Acute kidney injury/metabolic acidosis -Resolved.  3.  Mildly elevated LFT -Secondary to alcohol abuse. -Acute hepatitis panel negative. -LFTs improved.  4.  Low normal vitamin B12, macrocytic anemia -Continue vitamin B12 supplementation.  5.  Chronic alcohol abuse -No signs of withdrawal. -Out of window for withdrawal symptoms. -Continue thiamine, folic acid, multivitamin.  6.  Hypokalemia/hypomagnesemia/hypophosphatemia -Likely secondary to malnutrition/?  Refeeding syndrome -Magnesium at 1.7.  Magnesium sulfate 4 g IV x1.  -Low electrolytes and replete.  7.  Elevated blood pressure/sinus tachycardia -Improved on Coreg.  8.  Debility/frequent falls/impaired mobility -Seen by PT/OT who are recommending SNF placement. -Difficult to place patient due to lack of insurance. -TOC following.  9.  Constipation -Continue lactulose.  10.  Severe protein calorie malnutrition -Likely secondary to chronic alcohol use and malnutrition. -Evidenced by significant muscle mass and subcutaneous fat loss -BMI 22.46 kg/m -Continue nutritional supplementation with Ensure.   DVT prophylaxis: Lovenox Code Status: Full Family Communication: Updated patient.  No family at bedside. Disposition:   Status is: Inpatient    Dispo: The patient is from: Home              Anticipated d/c is to: SNF              Patient currently medically stable for discharge.   Difficult to place patient : Yes       Consultants:   None  Procedures:   CT head CT C-spine 12/28/2020  CT head 12/27/2020  Chest x-ray 12/27/2020  Antimicrobials:  Anti-infectives (From admission, onward)   None  Subjective: Patient jovial this morning sitting up in bed.  Denies any chest pain or shortness of breath.  No abdominal pain.  States he is hungry.  Alert to self and place.  Thinks it is 2004, March.  States being here he has lost track of  time.  Objective: Vitals:   01/30/21 1518 01/30/21 2122 01/31/21 0500 01/31/21 0602  BP: 122/76 115/80  116/74  Pulse: 97 83  77  Resp: (!) 22 17  18   Temp: 98.7 F (37.1 C) 98.5 F (36.9 C)  98.1 F (36.7 C)  TempSrc: Oral Oral  Oral  SpO2: 100% 98%  98%  Weight:   70.5 kg   Height:        Intake/Output Summary (Last 24 hours) at 01/31/2021 1034 Last data filed at 01/31/2021 0823 Gross per 24 hour  Intake 1188 ml  Output --  Net 1188 ml   Filed Weights   01/29/21 0540 01/30/21 0449 01/31/21 0500  Weight: 70.3 kg 70 kg 70.5 kg    Examination:  General exam: Appears calm and comfortable  Respiratory system: Clear to auscultation. Respiratory effort normal. Cardiovascular system: S1 & S2 heard, RRR. No JVD, murmurs, rubs, gallops or clicks. No pedal edema. Gastrointestinal system: Abdomen is nondistended, soft and nontender. No organomegaly or masses felt. Normal bowel sounds heard. Central nervous system: Alert and oriented. No focal neurological deficits. Extremities: Symmetric 5 x 5 power. Skin: No rashes, lesions or ulcers Psychiatry: Judgement and insight appear poor to fair. Mood & affect appropriate.     Data Reviewed: I have personally reviewed following labs and imaging studies  CBC: Recent Labs  Lab 01/26/21 0331  WBC 10.6*  HGB 12.0*  HCT 38.3*  MCV 102.4*  PLT 231    Basic Metabolic Panel: Recent Labs  Lab 01/26/21 0331 01/29/21 0742 01/31/21 0512  NA 138 137 138  K 4.6 4.3 3.9  CL 106 104 104  CO2 24 23 25   GLUCOSE 94 92 89  BUN 18 17 16   CREATININE 0.98 0.86 0.81  CALCIUM 9.7 9.8 9.7  MG 1.8 1.8 1.7  PHOS 5.4* 5.0* 4.8*    GFR: Estimated Creatinine Clearance: 97.9 mL/min (by C-G formula based on SCr of 0.81 mg/dL).  Liver Function Tests: Recent Labs  Lab 01/26/21 0331 01/29/21 0742 01/31/21 0512  AST 37 34 30  ALT 36 28 24  ALKPHOS 72 77 70  BILITOT 0.6 0.8 0.9  PROT 7.6 7.8 7.6  ALBUMIN 3.5 3.7 3.7    CBG: No  results for input(s): GLUCAP in the last 168 hours.   No results found for this or any previous visit (from the past 240 hour(s)).       Radiology Studies: No results found.      Scheduled Meds: . carvedilol  6.25 mg Oral BID WC  . enoxaparin (LOVENOX) injection  40 mg Subcutaneous Q24H  . feeding supplement  237 mL Oral TID BM  . folic acid  1 mg Oral Daily  . lactulose  20 g Oral BID  . multivitamin with minerals  1 tablet Oral Daily  . QUEtiapine  25 mg Oral BID  . sodium chloride flush  3 mL Intravenous Q12H  . [START ON 02/01/2021] thiamine  100 mg Oral Daily  . vitamin B-12  1,000 mcg Oral Daily   Continuous Infusions: . magnesium sulfate bolus IVPB 4 g (01/31/21 1002)  . thiamine injection 250 mg (01/30/21 1024)     LOS: 34 days  Time spent: 35 minutes    Ramiro Harvest, MD [facility]   To contact the attending provider between 7A-7P or the covering provider during after hours 7P-7A, please log into the web site [email or web address] and access using universal [place] password for that web site. If you do not have the password, please call the hospital operator.  01/31/2021, 10:34 AM

## 2021-02-01 LAB — BASIC METABOLIC PANEL
Anion gap: 8 (ref 5–15)
BUN: 15 mg/dL (ref 6–20)
CO2: 25 mmol/L (ref 22–32)
Calcium: 9.7 mg/dL (ref 8.9–10.3)
Chloride: 104 mmol/L (ref 98–111)
Creatinine, Ser: 0.92 mg/dL (ref 0.61–1.24)
GFR, Estimated: >60 mL/min (ref >60)
Glucose, Bld: 92 mg/dL (ref 70–99)
Potassium: 4.1 mmol/L (ref 3.5–5.1)
Sodium: 137 mmol/L (ref 135–145)

## 2021-02-01 LAB — CBC
HCT: 41.1 % (ref 39.0–52.0)
Hemoglobin: 13.3 g/dL (ref 13.0–17.0)
MCH: 32.6 pg (ref 26.0–34.0)
MCHC: 32.4 g/dL (ref 30.0–36.0)
MCV: 100.7 fL — ABNORMAL HIGH (ref 80.0–100.0)
Platelets: 216 10*3/uL (ref 150–400)
RBC: 4.08 MIL/uL — ABNORMAL LOW (ref 4.22–5.81)
RDW: 14.3 % (ref 11.5–15.5)
WBC: 9.8 10*3/uL (ref 4.0–10.5)
nRBC: 0 % (ref 0.0–0.2)

## 2021-02-01 LAB — MAGNESIUM: Magnesium: 2.1 mg/dL (ref 1.7–2.4)

## 2021-02-01 NOTE — TOC Progression Note (Addendum)
Transition of Care [facility]) - Progression Note    Patient Details  Name: Alvin Clark MRN: [id number] Date of Birth: 1961-01-04  [facility]) CM/SW Contact  Ida Rogue, [place] Phone Number: 02/01/2021, 1:47 PM  Clinical Narrative:   Still no bed offers.  TOC will continue to follow during the course of hospitalization.     Expected Discharge Plan: Assisted Living (versus family care home) Barriers to Discharge: Financial Resources,Homeless with medical needs,Continued Medical Work up,No SNF bed,Requiring sitter/restraints,SNF Pending payor source - LOG,SNF Pending Medicaid,Family Issues  Expected Discharge Plan and Services Expected Discharge Plan: Assisted Living (versus family care home) In-house Referral: Clinical Social Work,Financial Counselor     Living arrangements for the past 2 months: No permanent address                                       Social Determinants of Health (SDOH) Interventions    Readmission Risk Interventions No flowsheet data found.

## 2021-02-01 NOTE — TOC Progression Note (Addendum)
Transition of Care [facility]) - Progression Note    Patient Details  Name: Alvin Clark MRN: [id number] Date of Birth: 1961/06/03  Transition of Care [facility]) CM/SW Contact  Darleene Cleaver, [place] Phone Number:  02/01/2021, 5:40 PM  Clinical Narrative:     CSW contacted [facility], they do not have beds available.  CSW continuing to work on trying to find placement for family care home or ALF.   Expected Discharge Plan: Assisted Living (versus family care home) Barriers to Discharge: Financial Resources,Homeless with medical needs,Continued Medical Work up,No SNF bed,Requiring sitter/restraints,SNF Pending payor source - LOG,SNF Pending Medicaid,Family Issues  Expected Discharge Plan and Services Expected Discharge Plan: Assisted Living (versus family care home) In-house Referral: Clinical Social Work,Financial Counselor     Living arrangements for the past 2 months: No permanent address                                       Social Determinants of Health (SDOH) Interventions    Readmission Risk Interventions No flowsheet data found.

## 2021-02-01 NOTE — Progress Notes (Signed)
PROGRESS NOTE    Alvin Clark  PYK:[id number] DOB: 1961/01/05 DOA: 12/27/2020 PCP: Patient, No Pcp Per (Inactive)    Chief Complaint  Patient presents with  . Altered Mental Status    Brief Narrative:  60 yo M with PMH of alcohol abuse and related cognitive decline brought to ED by EMS with altered mental status. Reportedly trying to enter home that he was evicted from on 3/9, and homeowners called police when they found him disoriented and called EMS. Patient was admitted for acute metabolic encephalopathy, AKI and significant electrolyte arrangements. Encephalopathy work-up unrevealing. Required safety sitter for long time due to high risk of fall as he tends to jump out of the bed unexpectedly. Pending placement, difficult to place.    Assessment & Plan:   Principal Problem:   AKI (acute kidney injury) (HCC) Active Problems:   Confusion   Leukocytosis   Blurry vision, bilateral   Transaminitis   Constipation   Withdrawal symptoms, alcohol (HCC)   Protein-calorie malnutrition, severe   Acute metabolic encephalopathy   Hypokalemia   Hypomagnesemia   Hypophosphatemia   Fall   Debility   Low vitamin B12 level   Chronic alcohol use  1 acute metabolic encephalopathy in patient with gradually evolving cognitive impairment -Likely as a result of alcohol abuse and malnutrition. -Work-up negative to date including head CT which was negative for any acute abnormalities, UDS negative, RPR negative, TSH within normal limits, vitamin B12 low normal.  Ammonia level was elevated at 56>>> 16. -Clinical improvement.  -Alert and oriented to self and place.  Thinks is 2004, thinks it is March.  No focal deficits. -Continue current regimen of high-dose thiamine, lactulose 20 g twice daily and hold if greater than 2 bowel movements in a 24-hour period. -Continue Seroquel 25 mg twice daily, Haldol as needed. -Replete electrolytes keep potassium approximately 4, magnesium approximately  2. -Safety sitter has been discontinued.  2.  Acute kidney injury/metabolic acidosis -Resolved.  3.  Mildly elevated LFT -Secondary to alcohol abuse. -Acute hepatitis panel negative. -LFTs improved.  4.  Low normal vitamin B12, macrocytic anemia -Vitamin B12 supplementation.    5.  Chronic alcohol abuse -No signs of withdrawal. -Out of window for withdrawal symptoms. -Continue thiamine, folic acid, multivitamin.  6.  Hypokalemia/hypomagnesemia/hypophosphatemia -Likely secondary to malnutrition/?  Refeeding syndrome -Magnesium at 2.1.  Potassium at 4.1.    7.  Elevated blood pressure/sinus tachycardia -Continue Coreg.   8.  Debility/frequent falls/impaired mobility -Seen by PT/OT who are recommending SNF placement. -Difficult to place patient due to lack of insurance. -TOC following.  9.  Constipation -Lactulose.   10.  Severe protein calorie malnutrition -Likely secondary to chronic alcohol use and malnutrition. -Evidenced by significant muscle mass and subcutaneous fat loss -BMI 22.46 kg/m -Continue nutritional supplementation with Ensure.   DVT prophylaxis: Lovenox Code Status: Full Family Communication: Updated patient.  No family at bedside. Disposition:   Status is: Inpatient    Dispo: The patient is from: Home              Anticipated d/c is to: SNF              Patient currently medically stable for discharge.   Difficult to place patient : Yes       Consultants:   None  Procedures:   CT head CT C-spine 12/28/2020  CT head 12/27/2020  Chest x-ray 12/27/2020  Antimicrobials:  Anti-infectives (From admission, onward)   None  Subjective: Sleeping but arousable.  Denies any chest pain.  No shortness of breath.  No abdominal pain.  Tolerating diet.     Objective: Vitals:   01/31/21 1417 01/31/21 2000 02/01/21 0500 02/01/21 0533  BP: 108/71 116/74  104/69  Pulse: 77 78  79  Resp: 16 18  18   Temp: 98.4 F (36.9 C) 98.4 F (36.9  C)  98 F (36.7 C)  TempSrc: Oral     SpO2: 99% 98%  99%  Weight:   71.6 kg   Height:        Intake/Output Summary (Last 24 hours) at 02/01/2021 1004 Last data filed at 02/01/2021 0700 Gross per 24 hour  Intake 1599 ml  Output --  Net 1599 ml   Filed Weights   01/30/21 0449 01/31/21 0500 02/01/21 0500  Weight: 70 kg 70.5 kg 71.6 kg    Examination:  General exam: : NAD Respiratory system: CTAB, no wheezes, no crackles, no rhonchi.  Normal respiratory effort.  Speaking in full sentences. Cardiovascular system: Regular rate and rhythm no murmurs rubs or gallops.  No JVD.  No lower extremity edema.  Gastrointestinal system: Abdomen soft, nontender, nondistended, positive bowel sounds.  No rebound.  No guarding. Central nervous system: Alert and oriented. No focal neurological deficits. Extremities: Symmetric 5 x 5 power. Skin: No rashes, lesions or ulcers Psychiatry: Judgement and insight appear normal. Mood & affect appropriate.  Data Reviewed: I have personally reviewed following labs and imaging studies  CBC: Recent Labs  Lab 01/26/21 0331 02/01/21 0456  WBC 10.6* 9.8  HGB 12.0* 13.3  HCT 38.3* 41.1  MCV 102.4* 100.7*  PLT 231 216    Basic Metabolic Panel: Recent Labs  Lab 01/26/21 0331 01/29/21 0742 01/31/21 0512 02/01/21 0456  NA 138 137 138 137  K 4.6 4.3 3.9 4.1  CL 106 104 104 104  CO2 24 23 25 25   GLUCOSE 94 92 89 92  BUN 18 17 16 15   CREATININE 0.98 0.86 0.81 0.92  CALCIUM 9.7 9.8 9.7 9.7  MG 1.8 1.8 1.7 2.1  PHOS 5.4* 5.0* 4.8*  --     GFR: Estimated Creatinine Clearance: 86.5 mL/min (by C-G formula based on SCr of 0.92 mg/dL).  Liver Function Tests: Recent Labs  Lab 01/26/21 0331 01/29/21 0742 01/31/21 0512  AST 37 34 30  ALT 36 28 24  ALKPHOS 72 77 70  BILITOT 0.6 0.8 0.9  PROT 7.6 7.8 7.6  ALBUMIN 3.5 3.7 3.7    CBG: No results for input(s): GLUCAP in the last 168 hours.   No results found for this or any previous visit  (from the past 240 hour(s)).       Radiology Studies: No results found.      Scheduled Meds: . carvedilol  6.25 mg Oral BID WC  . enoxaparin (LOVENOX) injection  40 mg Subcutaneous Q24H  . feeding supplement  237 mL Oral TID BM  . folic acid  1 mg Oral Daily  . lactulose  20 g Oral BID  . multivitamin with minerals  1 tablet Oral Daily  . QUEtiapine  25 mg Oral BID  . sodium chloride flush  3 mL Intravenous Q12H  . thiamine  100 mg Oral Daily  . vitamin B-12  1,000 mcg Oral Daily   Continuous Infusions:    LOS: 35 days    Time spent: 35 minutes    03/28/21, MD [facility]   To contact the attending provider between 7A-7P or  the covering provider during after hours 7P-7A, please log into the web site [email or web address] and access using universal [place] password for that web site. If you do not have the password, please call the hospital operator.  02/01/2021, 10:04 AM

## 2021-02-02 LAB — MAGNESIUM: Magnesium: 1.7 mg/dL (ref 1.7–2.4)

## 2021-02-02 LAB — PHOSPHORUS: Phosphorus: 5.2 mg/dL — ABNORMAL HIGH (ref 2.5–4.6)

## 2021-02-02 MED ORDER — MAGNESIUM OXIDE -MG SUPPLEMENT 400 (240 MG) MG PO TABS
400.0000 mg | ORAL_TABLET | Freq: Two times a day (BID) | ORAL | Status: DC
  Administered 2021-02-02 – 2021-03-03 (×59): 400 mg via ORAL
  Filled 2021-02-02 (×59): qty 1

## 2021-02-02 NOTE — Progress Notes (Signed)
Hypoglycemic Event  CBG: 57  Treatment: 4 oz juice/soda  Symptoms: None  Follow-up CBG: Time:70 CBG Result:57  Possible Reasons for Event: Inadequate meal intake  Comments/MD notified:T. Opyd    Alvin Clark

## 2021-02-02 NOTE — Progress Notes (Signed)
PROGRESS NOTE    Alvin Clark  BDZ:[id number] DOB: 1961/10/01 DOA: 12/27/2020 PCP: Patient, No Pcp Per (Inactive)    Chief Complaint  Patient presents with  . Altered Mental Status    Brief Narrative:  60 yo M with PMH of alcohol abuse and related cognitive decline brought to ED by EMS with altered mental status. Reportedly trying to enter home that he was evicted from on 3/9, and homeowners called police when they found him disoriented and called EMS. Patient was admitted for acute metabolic encephalopathy, AKI and significant electrolyte arrangements. Encephalopathy work-up unrevealing. Required safety sitter for long time due to high risk of fall as he tends to jump out of the bed unexpectedly. Pending placement, difficult to place.    Assessment & Plan:   Principal Problem:   AKI (acute kidney injury) (HCC) Active Problems:   Confusion   Leukocytosis   Blurry vision, bilateral   Transaminitis   Constipation   Withdrawal symptoms, alcohol (HCC)   Protein-calorie malnutrition, severe   Acute metabolic encephalopathy   Hypokalemia   Hypomagnesemia   Hypophosphatemia   Fall   Debility   Low vitamin B12 level   Chronic alcohol use  1 acute metabolic encephalopathy in patient with gradually evolving cognitive impairment -Likely as a result of alcohol abuse and malnutrition. -Work-up negative to date including head CT which was negative for any acute abnormalities, UDS negative, RPR negative, TSH within normal limits, vitamin B12 low normal.  Ammonia level was elevated at 56>>> 16. -Clinical improvement.  -Alert and oriented to self and place.  Thinks is 2004, thinks it is March.  No focal deficits. -Continue current regimen of high-dose thiamine, lactulose 20 g twice daily and hold if greater than 2 bowel movements in a 24-hour period. -Continue Seroquel 25 mg twice daily, Haldol as needed. -Replete electrolytes keep potassium approximately 4, magnesium approximately  2. -Safety sitter has been discontinued.  2.  Acute kidney injury/metabolic acidosis -Resolved.  3.  Mildly elevated LFT -Secondary to alcohol abuse. -Acute hepatitis panel negative. -LFTs improved.  4.  Low normal vitamin B12, macrocytic anemia -Vitamin B12 supplementation.    5.  Chronic alcohol abuse -No signs of withdrawal. -Out of window for withdrawal symptoms. -Continue folic acid, multivitamin, thiamine.    6.  Hypokalemia/hypomagnesemia/hypophosphatemia -Likely secondary to malnutrition/?  Refeeding syndrome -Magnesium at 1.7.  Phosphorus at 5.2.  Potassium of 4.1.   -Place on magnesium oxide 400 mg twice daily.    7.  Elevated blood pressure/sinus tachycardia -Controlled on current regimen of Coreg.    8.  Debility/frequent falls/impaired mobility -Seen by PT/OT who are recommending SNF placement. -Difficult to place patient due to lack of insurance. -TOC following.  9.  Constipation -Continue lactulose.   10.  Severe protein calorie malnutrition -Likely secondary to chronic alcohol use and malnutrition. -Evidenced by significant muscle mass and subcutaneous fat loss -BMI 22.46 kg/m -Continue nutritional supplementation with Ensure.   DVT prophylaxis: Lovenox Code Status: Full Family Communication: Updated patient.  No family at bedside. Disposition:   Status is: Inpatient    Dispo: The patient is from: Home              Anticipated d/c is to: SNF              Patient currently medically stable for discharge.   Difficult to place patient : Yes       Consultants:   None  Procedures:   CT head CT C-spine 12/28/2020  CT head 12/27/2020  Chest x-ray 12/27/2020  Antimicrobials:  Anti-infectives (From admission, onward)   None       Subjective: Patient sitting up in bed awaiting his breakfast.  Denies any chest pain.  No shortness of breath.  No abdominal pain.     Objective: Vitals:   02/01/21 1258 02/01/21 2012 02/02/21 0453  02/02/21 0500  BP: 119/75 124/77 108/76   Pulse: 91 79 82   Resp: 17 18 18    Temp: 98 F (36.7 C) 98.4 F (36.9 C) 98.2 F (36.8 C)   TempSrc:  Oral Oral   SpO2: 98% 100% 99%   Weight:    67.8 kg  Height:        Intake/Output Summary (Last 24 hours) at 02/02/2021 1043 Last data filed at 02/02/2021 0850 Gross per 24 hour  Intake 480 ml  Output --  Net 480 ml   Filed Weights   01/31/21 0500 02/01/21 0500 02/02/21 0500  Weight: 70.5 kg 71.6 kg 67.8 kg    Examination:  General exam: : NAD Respiratory system: CTA B.  No wheezes, no rhonchi.  Speaking in full sentences.  Normal respiratory effort. Cardiovascular system: Regular rate and rhythm no murmurs rubs or gallops.  No JVD.  No lower extremity edema.  Gastrointestinal system: Abdomen soft, nontender, nondistended, positive bowel sounds.  No rebound.  No guarding. Central nervous system: Alert and oriented. No focal neurological deficits. Extremities: Symmetric 5 x 5 power. Skin: No rashes, lesions or ulcers Psychiatry: Judgement and insight appear fair. Mood & affect appropriate.  Data Reviewed: I have personally reviewed following labs and imaging studies  CBC: Recent Labs  Lab 02/01/21 0456  WBC 9.8  HGB 13.3  HCT 41.1  MCV 100.7*  PLT 216    Basic Metabolic Panel: Recent Labs  Lab 01/29/21 0742 01/31/21 0512 02/01/21 0456 02/02/21 0455  NA 137 138 137  --   K 4.3 3.9 4.1  --   CL 104 104 104  --   CO2 23 25 25   --   GLUCOSE 92 89 92  --   BUN 17 16 15   --   CREATININE 0.86 0.81 0.92  --   CALCIUM 9.8 9.7 9.7  --   MG 1.8 1.7 2.1 1.7  PHOS 5.0* 4.8*  --  5.2*    GFR: Estimated Creatinine Clearance: 82.9 mL/min (by C-G formula based on SCr of 0.92 mg/dL).  Liver Function Tests: Recent Labs  Lab 01/29/21 0742 01/31/21 0512  AST 34 30  ALT 28 24  ALKPHOS 77 70  BILITOT 0.8 0.9  PROT 7.8 7.6  ALBUMIN 3.7 3.7    CBG: No results for input(s): GLUCAP in the last 168 hours.   No results  found for this or any previous visit (from the past 240 hour(s)).       Radiology Studies: No results found.      Scheduled Meds: . carvedilol  6.25 mg Oral BID WC  . enoxaparin (LOVENOX) injection  40 mg Subcutaneous Q24H  . feeding supplement  237 mL Oral TID BM  . folic acid  1 mg Oral Daily  . lactulose  20 g Oral BID  . magnesium oxide  400 mg Oral BID  . multivitamin with minerals  1 tablet Oral Daily  . QUEtiapine  25 mg Oral BID  . sodium chloride flush  3 mL Intravenous Q12H  . thiamine  100 mg Oral Daily  . vitamin B-12  1,000 mcg Oral Daily  Continuous Infusions:    LOS: 36 days    Time spent: 35 minutes    Ramiro Harvest, MD [facility]   To contact the attending provider between 7A-7P or the covering provider during after hours 7P-7A, please log into the web site [email or web address] and access using universal [place] password for that web site. If you do not have the password, please call the hospital operator.  02/02/2021, 10:43 AM

## 2021-02-03 NOTE — Plan of Care (Signed)
  Problem: Education: Goal: Knowledge of General Education information will improve Description: Including pain rating scale, medication(s)/side effects and non-pharmacologic comfort measures Outcome: Progressing   Problem: Health Behavior/Discharge Planning: Goal: Ability to manage health-related needs will improve Outcome: Progressing   Problem: Clinical Measurements: Goal: Ability to maintain clinical measurements within normal limits will improve Outcome: Progressing Goal: Will remain free from infection Outcome: Progressing Goal: Diagnostic test results will improve Outcome: Progressing Goal: Respiratory complications will improve Outcome: Progressing Goal: Cardiovascular complication will be avoided Outcome: Progressing   Problem: Activity: Goal: Risk for activity intolerance will decrease Outcome: Progressing   Problem: Nutrition: Goal: Adequate nutrition will be maintained Outcome: Progressing   Problem: Coping: Goal: Level of anxiety will decrease Outcome: Progressing   Problem: Elimination: Goal: Will not experience complications related to bowel motility Outcome: Progressing Goal: Will not experience complications related to urinary retention Outcome: Progressing   Problem: Pain Managment: Goal: General experience of comfort will improve Outcome: Progressing   Problem: Safety: Goal: Ability to remain free from injury will improve Outcome: Progressing   Problem: Skin Integrity: Goal: Risk for impaired skin integrity will decrease Outcome: Progressing   Problem: Education: Goal: Knowledge of disease and its progression will improve Outcome: Progressing Goal: Individualized Educational Video(s) Outcome: Progressing   Problem: Fluid Volume: Goal: Compliance with measures to maintain balanced fluid volume will improve Outcome: Progressing   Problem: Health Behavior/Discharge Planning: Goal: Ability to manage health-related needs will  improve Outcome: Progressing   Problem: Nutritional: Goal: Ability to make healthy dietary choices will improve Outcome: Progressing   Problem: Clinical Measurements: Goal: Complications related to the disease process, condition or treatment will be avoided or minimized Outcome: Progressing   Problem: Safety: Goal: Non-violent Restraint(s) Outcome: Progressing   

## 2021-02-03 NOTE — Progress Notes (Signed)
PROGRESS NOTE    Alvin Clark  BSJ:[id number] DOB: [birth year] DOA: 12/27/2020 PCP: Patient, No Pcp Per (Inactive)    Chief Complaint  Patient presents with  . Altered Mental Status    Brief Narrative:  60 yo M with PMH of alcohol abuse and related cognitive decline brought to ED by EMS with altered mental status. Reportedly trying to enter home that he was evicted from on 3/9, and homeowners called police when they found him disoriented and called EMS. Patient was admitted for acute metabolic encephalopathy, AKI and significant electrolyte arrangements. Encephalopathy work-up unrevealing. Required safety sitter for long time due to high risk of fall as he tends to jump out of the bed unexpectedly. Pending placement, difficult to place.    Assessment & Plan:   Principal Problem:   AKI (acute kidney injury) (HCC) Active Problems:   Confusion   Leukocytosis   Blurry vision, bilateral   Transaminitis   Constipation   Withdrawal symptoms, alcohol (HCC)   Protein-calorie malnutrition, severe   Acute metabolic encephalopathy   Hypokalemia   Hypomagnesemia   Hypophosphatemia   Fall   Debility   Low vitamin B12 level   Chronic alcohol use  1 acute metabolic encephalopathy in patient with gradually evolving cognitive impairment -Likely as a result of alcohol abuse and malnutrition. -Work-up negative to date including head CT which was negative for any acute abnormalities, UDS negative, RPR negative, TSH within normal limits, vitamin B12 low normal.  Ammonia level was elevated at 56>>> 16. -Clinical improvement.  -Alert and oriented to self and place.  No focal deficits. -Continue current regimen of high-dose thiamine, lactulose 20 g twice daily and hold if greater than 2 bowel movements in a 24-hour period. -Continue Seroquel 25 mg twice daily, Haldol as needed. -Replete electrolytes keep potassium approximately 4, magnesium approximately 2. -Safety sitter has been  discontinued.  2.  Acute kidney injury/metabolic acidosis -Resolved.  3.  Mildly elevated LFT -Secondary to alcohol abuse. -Acute hepatitis panel negative. -LFTs improved.  4.  Low normal vitamin B12, macrocytic anemia -Continue vitamin B12 supplementation.   5.  Chronic alcohol abuse -No signs of withdrawal. -Out of window for withdrawal symptoms. -Folic acid, multivitamin, thiamine.   6.  Hypokalemia/hypomagnesemia/hypophosphatemia -Likely secondary to malnutrition/?  Refeeding syndrome -Magnesium at 1.7.  Phosphorus at 5.2.  Potassium of 4.1.   -Continue magnesium oxide 400 mg twice daily.    7.  Elevated blood pressure/sinus tachycardia -Continue Coreg.   8.  Debility/frequent falls/impaired mobility -Seen by PT/OT who are recommending SNF placement. -Difficult to place patient due to lack of insurance. -TOC following.  9.  Constipation -Continue lactulose.  10.  Severe protein calorie malnutrition -Likely secondary to chronic alcohol use and malnutrition. -Evidenced by significant muscle mass and subcutaneous fat loss -BMI 22.46 kg/m -Continue nutritional supplementation with Ensure.    DVT prophylaxis: Lovenox Code Status: Full Family Communication: Updated patient.  No family at bedside. Disposition:   Status is: Inpatient    Dispo: The patient is from: Home              Anticipated d/c is to: SNF              Patient currently medically stable for discharge.   Difficult to place patient : Yes       Consultants:   None  Procedures:   CT head CT C-spine 12/28/2020  CT head 12/27/2020  Chest x-ray 12/27/2020  Antimicrobials:  Anti-infectives (From admission, onward)  None       Subjective: Patient sleeping but arousable.  No chest pain.  No shortness of breath.  No abdominal pain.  Stated has ambulated in the hallway earlier on during the hospitalization.  States he will try to ambulate today and will sit in the chair.     Objective: Vitals:   02/02/21 0500 02/02/21 1345 02/02/21 2023 02/03/21 0426  BP:  109/74 127/76 121/86  Pulse:  91 93 91  Resp:  (!) 22 16 16   Temp:  98 F (36.7 C) 97.7 F (36.5 C) 97.9 F (36.6 C)  TempSrc:  Oral Oral Oral  SpO2:  97% 98% 98%  Weight: 67.8 kg     Height:        Intake/Output Summary (Last 24 hours) at 02/03/2021 1106 Last data filed at 02/03/2021 0800 Gross per 24 hour  Intake 720 ml  Output --  Net 720 ml   Filed Weights   01/31/21 0500 02/01/21 0500 02/02/21 0500  Weight: 70.5 kg 71.6 kg 67.8 kg    Examination:  General exam: : NAD Respiratory system: Clear to auscultation bilaterally.  No wheezes, no rhonchi, no crackles.  Speaking in full sentences.  Normal respiratory effort. Cardiovascular system: Regular rate and rhythm no murmurs rubs or gallops.  No JVD.  No lower extremity edema.  Gastrointestinal system: Abdomen soft, nontender, nondistended, positive bowel sounds.  No rebound.  No guarding. Central nervous system: Alert and oriented x2. No focal neurological deficits. Extremities: Symmetric 5 x 5 power. Skin: No rashes, lesions or ulcers Psychiatry: Judgement and insight fair. Mood & affect appropriate.  Data Reviewed: I have personally reviewed following labs and imaging studies  CBC: Recent Labs  Lab 02/01/21 0456  WBC 9.8  HGB 13.3  HCT 41.1  MCV 100.7*  PLT 216    Basic Metabolic Panel: Recent Labs  Lab 01/29/21 0742 01/31/21 0512 02/01/21 0456 02/02/21 0455  NA 137 138 137  --   K 4.3 3.9 4.1  --   CL 104 104 104  --   CO2 23 25 25   --   GLUCOSE 92 89 92  --   BUN 17 16 15   --   CREATININE 0.86 0.81 0.92  --   CALCIUM 9.8 9.7 9.7  --   MG 1.8 1.7 2.1 1.7  PHOS 5.0* 4.8*  --  5.2*    GFR: Estimated Creatinine Clearance: 82.9 mL/min (by C-G formula based on SCr of 0.92 mg/dL).  Liver Function Tests: Recent Labs  Lab 01/29/21 0742 01/31/21 0512  AST 34 30  ALT 28 24  ALKPHOS 77 70  BILITOT 0.8 0.9   PROT 7.8 7.6  ALBUMIN 3.7 3.7    CBG: No results for input(s): GLUCAP in the last 168 hours.   No results found for this or any previous visit (from the past 240 hour(s)).       Radiology Studies: No results found.      Scheduled Meds: . carvedilol  6.25 mg Oral BID WC  . enoxaparin (LOVENOX) injection  40 mg Subcutaneous Q24H  . feeding supplement  237 mL Oral TID BM  . folic acid  1 mg Oral Daily  . lactulose  20 g Oral BID  . magnesium oxide  400 mg Oral BID  . multivitamin with minerals  1 tablet Oral Daily  . QUEtiapine  25 mg Oral BID  . sodium chloride flush  3 mL Intravenous Q12H  . thiamine  100 mg Oral Daily  .  vitamin B-12  1,000 mcg Oral Daily   Continuous Infusions:    LOS: 37 days    Time spent: 35 minutes    Ramiro Harvest, MD [facility]   To contact the attending provider between 7A-7P or the covering provider during after hours 7P-7A, please log into the web site [email or web address] and access using universal [place] password for that web site. If you do not have the password, please call the hospital operator.  02/03/2021, 11:06 AM

## 2021-02-04 NOTE — Progress Notes (Signed)
PROGRESS NOTE    Alvin Clark  PFX:[id number] DOB: 09/04/1961 DOA: 12/27/2020 PCP: Patient, No Pcp Per (Inactive)    Chief Complaint  Patient presents with  . Altered Mental Status    Brief Narrative:  61 yo M with PMH of alcohol abuse and related cognitive decline brought to ED by EMS with altered mental status. Reportedly trying to enter home that he was evicted from on 3/9, and homeowners called police when they found him disoriented and called EMS. Patient was admitted for acute metabolic encephalopathy, AKI and significant electrolyte arrangements. Encephalopathy work-up unrevealing. Required safety sitter for long time due to high risk of fall as he tends to jump out of the bed unexpectedly. Pending placement, difficult to place.    Assessment & Plan:   Principal Problem:   AKI (acute kidney injury) (HCC) Active Problems:   Confusion   Leukocytosis   Blurry vision, bilateral   Transaminitis   Constipation   Withdrawal symptoms, alcohol (HCC)   Protein-calorie malnutrition, severe   Acute metabolic encephalopathy   Hypokalemia   Hypomagnesemia   Hypophosphatemia   Fall   Debility   Low vitamin B12 level   Chronic alcohol use  1 acute metabolic encephalopathy in patient with gradually evolving cognitive impairment -Likely as a result of alcohol abuse and malnutrition. -Work-up negative to date including head CT which was negative for any acute abnormalities, UDS negative, RPR negative, TSH within normal limits, vitamin B12 low normal.  Ammonia level was elevated at 56>>> 16. -Clinical improvement.  -Alert and oriented to self and place.  No focal deficits. -Continue current regimen of high-dose thiamine, lactulose 20 g twice daily and hold if > 2 bowel movements in a 24-hour period. -Seroquel 25 twice daily.  Haldol as needed.   -Repeat electrolytes to keep potassium approximately 4, magnesium approximately 2.   -Safety sitter has been discontinued.   2.  Acute  kidney injury/metabolic acidosis -Resolved.  3.  Mildly elevated LFT -Secondary to alcohol abuse. -Acute hepatitis panel negative. -LFTs improved.  4.  Low normal vitamin B12, macrocytic anemia -Continue vitamin B12 supplementation.   -Outpatient follow-up.   5.  Chronic alcohol abuse -No signs of withdrawal. -Out of window for withdrawal symptoms. -Folic acid, multivitamin, thiamine.   6.  Hypokalemia/hypomagnesemia/hypophosphatemia -Likely secondary to malnutrition/?  Refeeding syndrome -Magnesium at 1.7.  Phosphorus at 5.2.  Potassium of 4.1.   -Continue magnesium oxide 400 mg twice daily.   -Repeat labs in the morning.  7.  Elevated blood pressure/sinus tachycardia -Coreg.  8.  Debility/frequent falls/impaired mobility -Seen by PT/OT who recommended SNF placement.  -Difficult to place patient due to lack of insurance. -TOC following.  9.  Constipation -Lactulose.  10.  Severe protein calorie malnutrition -Likely secondary to chronic alcohol use and malnutrition. -Evidenced by significant muscle mass and subcutaneous fat loss -BMI 22.46 kg/m -Nutritional supplementation.    DVT prophylaxis: Lovenox Code Status: Full Family Communication: Updated patient.  No family at bedside. Disposition:   Status is: Inpatient    Dispo: The patient is from: Home              Anticipated d/c is to: SNF              Patient currently medically stable for discharge.   Difficult to place patient : Yes       Consultants:   None  Procedures:   CT head CT C-spine 12/28/2020  CT head 12/27/2020  Chest x-ray 12/27/2020  Antimicrobials:  Anti-infectives (From admission, onward)   None       Subjective: Sleeping but arousable.  No chest pain.  No shortness of breath.  No abdominal pain.  No complaints.    Objective: Vitals:   02/02/21 2023 02/03/21 0426 02/03/21 1344 02/04/21 0424  BP: 127/76 121/86 110/72 117/83  Pulse: 93 91 90 77  Resp: 16 16 17 16    Temp: 97.7 F (36.5 C) 97.9 F (36.6 C) 98.4 F (36.9 C) 97.8 F (36.6 C)  TempSrc: Oral Oral Oral Oral  SpO2: 98% 98% 98% 99%  Weight:      Height:        Intake/Output Summary (Last 24 hours) at 02/04/2021 1131 Last data filed at 02/04/2021 0700 Gross per 24 hour  Intake 720 ml  Output --  Net 720 ml   Filed Weights   01/31/21 0500 02/01/21 0500 02/02/21 0500  Weight: 70.5 kg 71.6 kg 67.8 kg    Examination:  General exam: : NAD Respiratory system: CTA B.  No wheezes, no rhonchi.  Speaking in full sentences.  Normal respiratory effort. Cardiovascular system: Regular rate and rhythm no murmurs rubs or gallops.  No JVD.  No lower extremity edema.  Gastrointestinal system: Abdomen soft, nontender, nondistended, positive bowel sounds.  No rebound.  No guarding. Central nervous system: Alert and oriented. No focal neurological deficits. Extremities: Symmetric 5 x 5 power. Skin: No rashes, lesions or ulcers Psychiatry: Judgement and insight appear normal. Mood & affect appropriate.  Data Reviewed: I have personally reviewed following labs and imaging studies  CBC: Recent Labs  Lab 02/01/21 0456  WBC 9.8  HGB 13.3  HCT 41.1  MCV 100.7*  PLT 216    Basic Metabolic Panel: Recent Labs  Lab 01/29/21 0742 01/31/21 0512 02/01/21 0456 02/02/21 0455  NA 137 138 137  --   K 4.3 3.9 4.1  --   CL 104 104 104  --   CO2 23 25 25   --   GLUCOSE 92 89 92  --   BUN 17 16 15   --   CREATININE 0.86 0.81 0.92  --   CALCIUM 9.8 9.7 9.7  --   MG 1.8 1.7 2.1 1.7  PHOS 5.0* 4.8*  --  5.2*    GFR: Estimated Creatinine Clearance: 82.9 mL/min (by C-G formula based on SCr of 0.92 mg/dL).  Liver Function Tests: Recent Labs  Lab 01/29/21 0742 01/31/21 0512  AST 34 30  ALT 28 24  ALKPHOS 77 70  BILITOT 0.8 0.9  PROT 7.8 7.6  ALBUMIN 3.7 3.7    CBG: No results for input(s): GLUCAP in the last 168 hours.   No results found for this or any previous visit (from the past  240 hour(s)).       Radiology Studies: No results found.      Scheduled Meds: . carvedilol  6.25 mg Oral BID WC  . enoxaparin (LOVENOX) injection  40 mg Subcutaneous Q24H  . feeding supplement  237 mL Oral TID BM  . folic acid  1 mg Oral Daily  . lactulose  20 g Oral BID  . magnesium oxide  400 mg Oral BID  . multivitamin with minerals  1 tablet Oral Daily  . QUEtiapine  25 mg Oral BID  . sodium chloride flush  3 mL Intravenous Q12H  . thiamine  100 mg Oral Daily  . vitamin B-12  1,000 mcg Oral Daily   Continuous Infusions:    LOS: 38 days  Time spent: 35 minutes    Ramiro Harvest, MD [facility]   To contact the attending provider between 7A-7P or the covering provider during after hours 7P-7A, please log into the web site [email or web address] and access using universal [place] password for that web site. If you do not have the password, please call the hospital operator.  02/04/2021, 11:31 AM

## 2021-02-05 LAB — BASIC METABOLIC PANEL
Anion gap: 8 (ref 5–15)
BUN: 18 mg/dL (ref 6–20)
CO2: 28 mmol/L (ref 22–32)
Calcium: 9.9 mg/dL (ref 8.9–10.3)
Chloride: 104 mmol/L (ref 98–111)
Creatinine, Ser: 1.02 mg/dL (ref 0.61–1.24)
GFR, Estimated: >60 mL/min (ref >60)
Glucose, Bld: 95 mg/dL (ref 70–99)
Potassium: 4.4 mmol/L (ref 3.5–5.1)
Sodium: 140 mmol/L (ref 135–145)

## 2021-02-05 LAB — PHOSPHORUS: Phosphorus: 5.2 mg/dL — ABNORMAL HIGH (ref 2.5–4.6)

## 2021-02-05 LAB — MAGNESIUM: Magnesium: 1.9 mg/dL (ref 1.7–2.4)

## 2021-02-05 NOTE — TOC Progression Note (Signed)
Transition of Care [facility]) - Progression Note    Patient Details  Name: Alvin Clark MRN: [id number] Date of Birth: Jul 21, 1961  Transition of Care [facility]) CM/SW Contact  Ida Rogue, [place] Phone Number: 02/05/2021, 2:19 PM  Clinical Narrative:   Sent information to [place] [facility] at [phone], phone [phone], per her request.  She inquired as to whether we had any other referrals for her to review when she and I were talking about another referral. TOC will continue to follow during the course of hospitalization.     Expected Discharge Plan: Assisted Living (versus family care home) Barriers to Discharge: Financial Resources,Homeless with medical needs,Continued Medical Work up,No SNF bed,Requiring sitter/restraints,SNF Pending payor source - LOG,SNF Pending Medicaid,Family Issues  Expected Discharge Plan and Services Expected Discharge Plan: Assisted Living (versus family care home) In-house Referral: Clinical Social Work,Financial Counselor     Living arrangements for the past 2 months: No permanent address                                       Social Determinants of Health (SDOH) Interventions    Readmission Risk Interventions No flowsheet data found.

## 2021-02-05 NOTE — Progress Notes (Signed)
PROGRESS NOTE    Alvin Clark  ZDG:[id number] DOB: Jun 14, 1961 DOA: 12/27/2020 PCP: Patient, No Pcp Per (Inactive)    Chief Complaint  Patient presents with  . Altered Mental Status    Brief Narrative:  60 yo M with PMH of alcohol abuse and related cognitive decline brought to ED by EMS with altered mental status. Reportedly trying to enter home that he was evicted from on 3/9, and homeowners called police when they found him disoriented and called EMS. Patient was admitted for acute metabolic encephalopathy, AKI and significant electrolyte arrangements. Encephalopathy work-up unrevealing. Required safety sitter for long time due to high risk of fall as he tends to jump out of the bed unexpectedly. Pending placement, difficult to place.    Assessment & Plan:   Principal Problem:   AKI (acute kidney injury) (HCC) Active Problems:   Confusion   Leukocytosis   Blurry vision, bilateral   Transaminitis   Constipation   Withdrawal symptoms, alcohol (HCC)   Protein-calorie malnutrition, severe   Acute metabolic encephalopathy   Hypokalemia   Hypomagnesemia   Hypophosphatemia   Fall   Debility   Low vitamin B12 level   Chronic alcohol use  1 acute metabolic encephalopathy in patient with gradually evolving cognitive impairment -Likely as a result of alcohol abuse and malnutrition. -Work-up negative to date including head CT which was negative for any acute abnormalities, UDS negative, RPR negative, TSH within normal limits, vitamin B12 low normal.  Ammonia level was elevated at 56>>> 16. -Improved clinically and likely close to baseline. -Alert and oriented to self and place.  No focal deficits. -Was on high-dose thiamine and currently now on thiamine 100 mg daily.   -Continue lactulose 20 g twice daily and hold if > 2 bowel movements in a 24-hour period. -Seroquel 25 twice daily.  Haldol as needed.   -Electrolytes repleted.   -Recruitment consultant has been discontinued.   2.   Acute kidney injury/metabolic acidosis -Resolved.  3.  Mildly elevated LFT -Secondary to alcohol abuse. -Acute hepatitis panel negative. -LFTs improved.  4.  Low normal vitamin B12, macrocytic anemia -Vitamin B12 supplementation.   -Outpatient follow-up.   5.  Chronic alcohol abuse -No signs of withdrawal. -Out of window for withdrawal symptoms. -Folic acid, multivitamin, thiamine.   6.  Hypokalemia/hypomagnesemia/hypophosphatemia -Likely secondary to malnutrition/?  Refeeding syndrome -Magnesium at 1.9.  Phosphorus at 5.2.  Potassium of 4.4.   -Mag oxide 400 mg p.o. twice daily.   7.  Elevated blood pressure/sinus tachycardia -Controlled on current regimen of Coreg.   8.  Debility/frequent falls/impaired mobility -Seen by PT/OT who recommended SNF placement.  -Difficult to place patient due to lack of insurance. -TOC following.  9.  Constipation -Continue lactulose.   10.  Severe protein calorie malnutrition -Likely secondary to chronic alcohol use and malnutrition. -Evidenced by significant muscle mass and subcutaneous fat loss -BMI 22.46 kg/m -Nutritional supplementation.    DVT prophylaxis: Lovenox Code Status: Full Family Communication: Updated patient.  No family at bedside. Disposition:   Status is: Inpatient    Dispo: The patient is from: Home              Anticipated d/c is to: SNF              Patient currently medically stable for discharge.   Difficult to place patient : Yes       Consultants:   None  Procedures:   CT head CT C-spine 12/28/2020  CT head 12/27/2020  Chest x-ray 12/27/2020  Antimicrobials:  Anti-infectives (From admission, onward)   None       Subjective: Sitting up in bed.   Patient denies any chest pain.  No shortness of breath.  No abdominal pain.  Tolerating current diet.  Awaiting placement.   Objective: Vitals:   02/04/21 0424 02/04/21 1341 02/04/21 2024 02/05/21 0430  BP: 117/83 108/66 116/78 114/85   Pulse: 77 96 95 94  Resp: 16 18 16 16   Temp: 97.8 F (36.6 C) 98.6 F (37 C) 98.5 F (36.9 C) 97.6 F (36.4 C)  TempSrc: Oral  Oral Oral  SpO2: 99% 96% 96% 98%  Weight:      Height:        Intake/Output Summary (Last 24 hours) at 02/05/2021 1043 Last data filed at 02/04/2021 2300 Gross per 24 hour  Intake 780 ml  Output --  Net 780 ml   Filed Weights   01/31/21 0500 02/01/21 0500 02/02/21 0500  Weight: 70.5 kg 71.6 kg 67.8 kg    Examination:  General exam: : NAD Respiratory system: CTA B.  No wheezes, no rhonchi.  Speaking in full sentences.  Normal respiratory effort. Cardiovascular system: Regular rate and rhythm no murmurs rubs or gallops.  No JVD.  No lower extremity edema.  Gastrointestinal system: Abdomen soft, nontender, nondistended, positive bowel sounds.  No rebound.  No guarding. Central nervous system: Alert and oriented. No focal neurological deficits. Extremities: Symmetric 5 x 5 power. Skin: No rashes, lesions or ulcers Psychiatry: Judgement and insight appear normal. Mood & affect appropriate.   Data Reviewed: I have personally reviewed following labs and imaging studies  CBC: Recent Labs  Lab 02/01/21 0456  WBC 9.8  HGB 13.3  HCT 41.1  MCV 100.7*  PLT 216    Basic Metabolic Panel: Recent Labs  Lab 01/31/21 0512 02/01/21 0456 02/02/21 0455 02/05/21 0426  NA 138 137  --  140  K 3.9 4.1  --  4.4  CL 104 104  --  104  CO2 25 25  --  28  GLUCOSE 89 92  --  95  BUN 16 15  --  18  CREATININE 0.81 0.92  --  1.02  CALCIUM 9.7 9.7  --  9.9  MG 1.7 2.1 1.7 1.9  PHOS 4.8*  --  5.2* 5.2*    GFR: Estimated Creatinine Clearance: 74.8 mL/min (by C-G formula based on SCr of 1.02 mg/dL).  Liver Function Tests: Recent Labs  Lab 01/31/21 0512  AST 30  ALT 24  ALKPHOS 70  BILITOT 0.9  PROT 7.6  ALBUMIN 3.7    CBG: No results for input(s): GLUCAP in the last 168 hours.   No results found for this or any previous visit (from the past  240 hour(s)).       Radiology Studies: No results found.      Scheduled Meds: . carvedilol  6.25 mg Oral BID WC  . enoxaparin (LOVENOX) injection  40 mg Subcutaneous Q24H  . feeding supplement  237 mL Oral TID BM  . folic acid  1 mg Oral Daily  . lactulose  20 g Oral BID  . magnesium oxide  400 mg Oral BID  . multivitamin with minerals  1 tablet Oral Daily  . QUEtiapine  25 mg Oral BID  . sodium chloride flush  3 mL Intravenous Q12H  . thiamine  100 mg Oral Daily  . vitamin B-12  1,000 mcg Oral Daily   Continuous Infusions:  LOS: 39 days    Time spent: 35 minutes    Ramiro Harvest, MD [facility]   To contact the attending provider between 7A-7P or the covering provider during after hours 7P-7A, please log into the web site [email or web address] and access using universal [place] password for that web site. If you do not have the password, please call the hospital operator.  02/05/2021, 10:43 AM

## 2021-02-06 NOTE — Plan of Care (Signed)
  Problem: Health Behavior/Discharge Planning: Goal: Ability to manage health-related needs will improve Outcome: Progressing   Problem: Clinical Measurements: Goal: Will remain free from infection Outcome: Progressing   Problem: Clinical Measurements: Goal: Complications related to the disease process, condition or treatment will be avoided or minimized Outcome: Progressing   Problem: Clinical Measurements: Goal: Will remain free from infection Outcome: Progressing   Problem: Coping: Goal: Level of anxiety will decrease Outcome: Progressing   Problem: Safety: Goal: Ability to remain free from injury will improve Outcome: Progressing

## 2021-02-06 NOTE — Progress Notes (Signed)
PROGRESS NOTE    Alvin Clark  YKD:[id number] DOB: [birth year] DOA: 12/27/2020 PCP: Patient, No Pcp Per (Inactive)    Chief Complaint  Patient presents with  . Altered Mental Status    Brief Narrative:  60 yo M with PMH of alcohol abuse and related cognitive decline brought to ED by EMS with altered mental status. Reportedly trying to enter home that he was evicted from on 3/9, and homeowners called police when they found him disoriented and called EMS. Patient was admitted for acute metabolic encephalopathy, AKI and significant electrolyte arrangements. Encephalopathy work-up unrevealing. Required safety sitter for long time due to high risk of fall as he tends to jump out of the bed unexpectedly. Pending placement, difficult to place.    Assessment & Plan:   Principal Problem:   AKI (acute kidney injury) (HCC) Active Problems:   Confusion   Leukocytosis   Blurry vision, bilateral   Transaminitis   Constipation   Withdrawal symptoms, alcohol (HCC)   Protein-calorie malnutrition, severe   Acute metabolic encephalopathy   Hypokalemia   Hypomagnesemia   Hypophosphatemia   Fall   Debility   Low vitamin B12 level   Chronic alcohol use  1 acute metabolic encephalopathy in patient with gradually evolving cognitive impairment -Likely as a result of alcohol abuse and malnutrition. -Work-up negative to date including head CT which was negative for any acute abnormalities, UDS negative, RPR negative, TSH within normal limits, vitamin B12 low normal.  Ammonia level was elevated at 56>>> 16. -Improved clinically and likely close to baseline. -Alert and oriented to self and place.  No focal deficits. -Was on high-dose thiamine and currently now on thiamine 100 mg daily.   -Continue lactulose 20 g twice daily and hold if > 2 bowel movements in a 24-hour period. -Seroquel 25 twice daily.  Haldol as needed.   -Electrolytes repleted.   -Recruitment consultant has been discontinued.   2.   Acute kidney injury/metabolic acidosis -Resolved.  3.  Mildly elevated LFT -Secondary to alcohol abuse. -Acute hepatitis panel negative. -LFTs improved.  4.  Low normal vitamin B12, macrocytic anemia -Continue vitamin B12 supplementation.   -Outpatient follow-up.   5.  Chronic alcohol abuse -No signs of withdrawal. -Out of window for withdrawal symptoms. -Continue thiamine, folic acid, multivitamin.   6.  Hypokalemia/hypomagnesemia/hypophosphatemia -Likely secondary to malnutrition/?  Refeeding syndrome -Magnesium at 1.9.  Phosphorus at 5.2.  Potassium at 4.4 -Continue magnesium oxide 400 mg p.o. twice daily.  7.  Elevated blood pressure/sinus tachycardia -Stable on current regimen of Coreg.    8.  Debility/frequent falls/impaired mobility -Seen by PT/OT who recommended SNF placement.  -Difficult to place patient due to lack of insurance. -TOC following.  9.  Constipation -Improved on lactulose.    10.  Severe protein calorie malnutrition -Likely secondary to chronic alcohol use and malnutrition. -Evidenced by significant muscle mass and subcutaneous fat loss -BMI 22.46 kg/m -Nutritional supplementation.    DVT prophylaxis: Lovenox Code Status: Full Family Communication: Updated patient.  No family at bedside. Disposition:   Status is: Inpatient    Dispo: The patient is from: Home              Anticipated d/c is to: SNF              Patient currently medically stable for discharge.   Difficult to place patient : Yes       Consultants:   None  Procedures:   CT head CT C-spine 12/28/2020  CT head 12/27/2020  Chest x-ray 12/27/2020  Antimicrobials:  Anti-infectives (From admission, onward)   None       Subjective: Laying in bed.  No chest pain.  No shortness of breath.  No abdominal pain.  Tolerating current diet.  Awaiting placement.     Objective: Vitals:   02/05/21 1311 02/05/21 1943 02/06/21 0410 02/06/21 0500  BP: 114/75 129/89 100/68    Pulse: 88 81 82   Resp: 18 18 18    Temp: 98.4 F (36.9 C) 97.6 F (36.4 C) 98.4 F (36.9 C)   TempSrc: Oral     SpO2: 97% 99% 96%   Weight:    73.4 kg  Height:        Intake/Output Summary (Last 24 hours) at 02/06/2021 1108 Last data filed at 02/06/2021 1050 Gross per 24 hour  Intake 1181 ml  Output 375 ml  Net 806 ml   Filed Weights   02/01/21 0500 02/02/21 0500 02/06/21 0500  Weight: 71.6 kg 67.8 kg 73.4 kg    Examination:  General exam: : NAD Respiratory system: Lungs clear to auscultation bilaterally.  No wheezes, no rhonchi.  Speaking in full sentences.  Normal respiratory effort. Cardiovascular system: Regular rate and rhythm no murmurs rubs or gallops.  No JVD.  No lower extremity edema.  Gastrointestinal system: Abdomen soft, nontender, nondistended, positive bowel sounds.  No rebound.  No guarding. Central nervous system: Alert and oriented. No focal neurological deficits. Extremities: Symmetric 5 x 5 power. Skin: No rashes, lesions or ulcers Psychiatry: Judgement and insight appear normal. Mood & affect appropriate.  Data Reviewed: I have personally reviewed following labs and imaging studies  CBC: Recent Labs  Lab 02/01/21 0456  WBC 9.8  HGB 13.3  HCT 41.1  MCV 100.7*  PLT 216    Basic Metabolic Panel: Recent Labs  Lab 01/31/21 0512 02/01/21 0456 02/02/21 0455 02/05/21 0426  NA 138 137  --  140  K 3.9 4.1  --  4.4  CL 104 104  --  104  CO2 25 25  --  28  GLUCOSE 89 92  --  95  BUN 16 15  --  18  CREATININE 0.81 0.92  --  1.02  CALCIUM 9.7 9.7  --  9.9  MG 1.7 2.1 1.7 1.9  PHOS 4.8*  --  5.2* 5.2*    GFR: Estimated Creatinine Clearance: 78 mL/min (by C-G formula based on SCr of 1.02 mg/dL).  Liver Function Tests: Recent Labs  Lab 01/31/21 0512  AST 30  ALT 24  ALKPHOS 70  BILITOT 0.9  PROT 7.6  ALBUMIN 3.7    CBG: No results for input(s): GLUCAP in the last 168 hours.   No results found for this or any previous visit  (from the past 240 hour(s)).       Radiology Studies: No results found.      Scheduled Meds: . carvedilol  6.25 mg Oral BID WC  . enoxaparin (LOVENOX) injection  40 mg Subcutaneous Q24H  . feeding supplement  237 mL Oral TID BM  . folic acid  1 mg Oral Daily  . lactulose  20 g Oral BID  . magnesium oxide  400 mg Oral BID  . multivitamin with minerals  1 tablet Oral Daily  . QUEtiapine  25 mg Oral BID  . sodium chloride flush  3 mL Intravenous Q12H  . thiamine  100 mg Oral Daily  . vitamin B-12  1,000 mcg Oral Daily   Continuous  Infusions:    LOS: 40 days    Time spent: 35 minutes    Ramiro Harvest, MD [facility]   To contact the attending provider between 7A-7P or the covering provider during after hours 7P-7A, please log into the web site [email or web address] and access using universal [place] password for that web site. If you do not have the password, please call the hospital operator.  02/06/2021, 11:08 AM

## 2021-02-07 DIAGNOSIS — W19XXXA Unspecified fall, initial encounter: Secondary | ICD-10-CM

## 2021-02-07 LAB — PHOSPHORUS: Phosphorus: 4.8 mg/dL — ABNORMAL HIGH (ref 2.5–4.6)

## 2021-02-07 LAB — MAGNESIUM: Magnesium: 1.8 mg/dL (ref 1.7–2.4)

## 2021-02-07 NOTE — Progress Notes (Signed)
PROGRESS NOTE    REX OESTERLE  CZY:[id number] DOB: 06-14-1961 DOA: 12/27/2020 PCP: Patient, No Pcp Per (Inactive)   Brief Narrative:   Patient is a 70 male with past medical history of alcohol abuse in cognitive decline secondary to alcohol was brought into the hospital by EMS with altered mental status. Reportedly trying to enter home that he was evicted  on 3/9, and homeowners called police when they found him disoriented. Patient was admitted to the hospital with diagnosis of acute metabolic encephalopathy, acute kidney injury and significant electrolyte imbalances.  Encephalopathy work-up was unrevealing. Required safety sitter for a long time due to high risk of fall and trying to jump out of the bed unexpectedly. Pending placement at this time.  Patient is difficult to place.    Assessment & Plan:   Principal Problem:   AKI (acute kidney injury) (HCC) Active Problems:   Confusion   Leukocytosis   Blurry vision, bilateral   Transaminitis   Constipation   Withdrawal symptoms, alcohol (HCC)   Protein-calorie malnutrition, severe   Acute metabolic encephalopathy   Hypokalemia   Hypomagnesemia   Hypophosphatemia   Fall   Debility   Low vitamin B12 level   Chronic alcohol use  Acute metabolic encephalopathy with underlying cognitive impairment Likely secondary to alcohol abuse.  CT head was negative.  UDS RPR negative including TSH and vitamin B12.  Patient has clinically improved and is at his baseline.  Alert awake and oriented to self and place.  No focal neurological deficits.  Initially received high-dose IV thiamine and currently on oral thiamine.  Continue lactulose Seroquel will replenish electrolytes as necessary.  Initially currently not on any seizure.  Continue Haldol as necessary.  Acute kidney injury/metabolic acidosis Has resolved at this time.  Latest creatinine of 1.0.  Mildly elevated LFT Secondary to alcohol abuse.  Hepatitis panel was negative.  Low  normal vitamin B12, macrocytic anemia Continue vitamin B12 supplementation  Chronic alcohol abuse Will continue thiamine, folic acid, multivitamin.  No signs of withdrawal noted at this time.  Hypokalemia/hypomagnesemia/hypophosphatemia -With history of alcohol abuse and malnutrition.  Has been replenished.   Elevated blood pressure/sinus tachycardia Currently on Coreg with control of heart rate and blood pressure  Debility/frequent falls/impaired mobility Seen by physical therapy, Occupational Therapy during hospitalization and recommended skilled nursing facility placement.  Difficult to place due to lack of insurance.  Transition of care on board.  Constipation -Improved on lactulose.    Severe protein calorie malnutrition Present on admission evidenced by significant muscle mass and subcutaneous fat loss.-BMI 22.46 kg/m.  Continue Ensure.  DVT prophylaxis:  Lovenox subcu  Code Status:  Full code   Family Communication:  None  Disposition:   Status is: Inpatient  Dispo: The patient is from: Home              Anticipated d/c is to: SNF              Patient currently medically stable for discharge.   Difficult to place patient : Yes   Consultants:   None  Procedures:   CT head CT C-spine 12/28/2020  CT head 12/27/2020  Chest x-ray 12/27/2020  Antimicrobials:  None  Subjective: Today, patient was seen and examined at bedside.  Denies any shortness of breath pain, nausea vomiting.   Objective: Vitals:   02/06/21 2027 02/07/21 0400 02/07/21 0422 02/07/21 0805  BP: 128/78 115/73  111/69  Pulse: 80 82  83  Resp: 18 18  20  Temp: 97.8 F (36.6 C) 98 F (36.7 C)  98.2 F (36.8 C)  TempSrc: Oral Oral  Oral  SpO2:  97%  97%  Weight:   70.2 kg   Height:        Intake/Output Summary (Last 24 hours) at 02/07/2021 1102 Last data filed at 02/07/2021 0854 Gross per 24 hour  Intake 1416 ml  Output --  Net 1416 ml   Filed Weights   02/02/21 0500 02/06/21  0500 02/07/21 0422  Weight: 67.8 kg 73.4 kg 70.2 kg   Body mass index is 22.85 kg/m.   Physical examination: General:  Average built, not in obvious distress HENT:   No scleral pallor or icterus noted. Oral mucosa is moist.  Chest:  Clear breath sounds.  Diminished breath sounds bilaterally. No crackles or wheezes.  CVS: S1 &S2 heard. No murmur.  Regular rate and rhythm. Abdomen: Soft, nontender, nondistended.  Bowel sounds are heard.   Extremities: No cyanosis, clubbing or edema.  Peripheral pulses are palpable. Psych: Alert, awake and communicative, oriented CNS:  No cranial nerve deficits.  Power equal in all extremities.   Skin: Warm and dry.  No rashes noted.   Data Reviewed: I have personally reviewed following labs and imaging studies  CBC: Recent Labs  Lab 02/01/21 0456  WBC 9.8  HGB 13.3  HCT 41.1  MCV 100.7*  PLT 216    Basic Metabolic Panel: Recent Labs  Lab 02/01/21 0456 02/02/21 0455 02/05/21 0426 02/07/21 0502  NA 137  --  140  --   K 4.1  --  4.4  --   CL 104  --  104  --   CO2 25  --  28  --   GLUCOSE 92  --  95  --   BUN 15  --  18  --   CREATININE 0.92  --  1.02  --   CALCIUM 9.7  --  9.9  --   MG 2.1 1.7 1.9 1.8  PHOS  --  5.2* 5.2* 4.8*    GFR: Estimated Creatinine Clearance: 77.4 mL/min (by C-G formula based on SCr of 1.02 mg/dL).  Liver Function Tests: No results for input(s): AST, ALT, ALKPHOS, BILITOT, PROT, ALBUMIN in the last 168 hours.  CBG: No results for input(s): GLUCAP in the last 168 hours.   No results found for this or any previous visit (from the past 240 hour(s)).     Radiology Studies: No results found.      Scheduled Meds: . carvedilol  6.25 mg Oral BID WC  . enoxaparin (LOVENOX) injection  40 mg Subcutaneous Q24H  . feeding supplement  237 mL Oral TID BM  . folic acid  1 mg Oral Daily  . lactulose  20 g Oral BID  . magnesium oxide  400 mg Oral BID  . multivitamin with minerals  1 tablet Oral Daily  .  QUEtiapine  25 mg Oral BID  . sodium chloride flush  3 mL Intravenous Q12H  . thiamine  100 mg Oral Daily  . vitamin B-12  1,000 mcg Oral Daily   Continuous Infusions:    LOS: 41 days    Joycelyn Das, MD [facility] 02/07/2021, 11:02 AM

## 2021-02-07 NOTE — Plan of Care (Signed)
  Problem: Activity: Goal: Risk for activity intolerance will decrease Outcome: Progressing   Problem: Clinical Measurements: Goal: Will remain free from infection Outcome: Progressing   Problem: Nutrition: Goal: Adequate nutrition will be maintained Outcome: Progressing

## 2021-02-08 LAB — COMPREHENSIVE METABOLIC PANEL
ALT: 19 U/L (ref 0–44)
AST: 27 U/L (ref 15–41)
Albumin: 3.6 g/dL (ref 3.5–5.0)
Alkaline Phosphatase: 71 U/L (ref 38–126)
Anion gap: 8 (ref 5–15)
BUN: 17 mg/dL (ref 6–20)
CO2: 26 mmol/L (ref 22–32)
Calcium: 9.8 mg/dL (ref 8.9–10.3)
Chloride: 104 mmol/L (ref 98–111)
Creatinine, Ser: 0.97 mg/dL (ref 0.61–1.24)
GFR, Estimated: >60 mL/min (ref >60)
Glucose, Bld: 100 mg/dL — ABNORMAL HIGH (ref 70–99)
Potassium: 4.6 mmol/L (ref 3.5–5.1)
Sodium: 138 mmol/L (ref 135–145)
Total Bilirubin: 0.5 mg/dL (ref 0.3–1.2)
Total Protein: 7.7 g/dL (ref 6.5–8.1)

## 2021-02-08 NOTE — TOC Progression Note (Signed)
Transition of Care [facility]) - Progression Note    Patient Details  Name: Alvin Clark MRN: [id number] Date of Birth: Jul 28, 1961  Transition of Care [facility]) CM/SW Contact  Darleene Cleaver, [place] Phone Number: 02/08/2021, 11:08 AM  Clinical Narrative:     CSW emailed financial counseling to get an update on Medicaid Pending or disability pending, waiting for a response back.  CSW to continue working on placement for patient.   Expected Discharge Plan: Assisted Living (versus family care home) Barriers to Discharge: Financial Resources,Homeless with medical needs,Continued Medical Work up,No SNF bed,Requiring sitter/restraints,SNF Pending payor source - LOG,SNF Pending Medicaid,Family Issues  Expected Discharge Plan and Services Expected Discharge Plan: Assisted Living (versus family care home) In-house Referral: Clinical Social Work,Financial Counselor     Living arrangements for the past 2 months: No permanent address                                       Social Determinants of Health (SDOH) Interventions    Readmission Risk Interventions No flowsheet data found.

## 2021-02-08 NOTE — Progress Notes (Signed)
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 Reportedly trying to enter home that he was evicted  on 3/9, and homeowners called police when they found him disoriented. Patient was admitted to the hospital with diagnosis of acute metabolic encephalopathy, acute kidney injury and significant electrolyte imbalances.  Encephalopathy work-up was unrevealing. Required safety sitter for a long time due to high risk of fall and trying to jump out of the bed unexpectedly. Pending placement at this time.  Patient is difficult to place.   Assessment & Plan:   Principal Problem:   AKI (acute kidney injury) (HCC) Active Problems:   Confusion   Leukocytosis   Blurry vision, bilateral   Transaminitis   Constipation   Withdrawal symptoms, alcohol (HCC)   Protein-calorie malnutrition, severe   Acute metabolic encephalopathy   Hypokalemia   Hypomagnesemia   Hypophosphatemia   Fall   Debility   Low vitamin B12 level   Chronic alcohol use  Acute metabolic encephalopathy with underlying cognitive impairment. Likely secondary to alcohol abuse.  CT head was negative.  UDS, RPRwas negative including TSH and vitamin B12.  Patient has clinically improved and is at his baseline.  Alert awake and oriented to self and place.  No focal neurological deficits.  Initially received high-dose IV thiamine and currently on oral thiamine.  Continue lactulose, Seroquel..  Continue Haldol as necessary.  Acute kidney injury/metabolic acidosis Has resolved at this time.  Latest creatinine of 0.9  Mildly elevated LFT Secondary to alcohol abuse.  Hepatitis panel was negative. Improved ALT and AST at this time.  Low normal vitamin B12, macrocytic  anemia Continue vitamin B12 supplementation  Chronic alcohol abuse Will continue thiamine, folic acid, multivitamin.  No signs of withdrawal   Hypokalemia/hypomagnesemia/hypophosphatemia Secondary to  alcohol abuse and malnutrition.  Has been replenished and has been normalized.    Elevated blood pressure/sinus tachycardia Currently on Coreg with control of heart rate and blood pressure  Debility/frequent falls/impaired mobility Seen by physical therapy, Occupational therapy during hospitalization and recommended skilled nursing facility placement.  Difficult to place due to lack of insurance.  Transition of care on board.  Constipation Improved on lactulose.    Severe protein calorie malnutrition Present on admission evidenced by significant muscle mass and subcutaneous fat loss.-BMI 22.46 kg/m.  Continue Ensure supplementation.   DVT prophylaxis:  Lovenox subcu  Code Status:  Full code   Family Communication:  None  Disposition:   Status is: Inpatient  Dispo: The patient is from: Home              Anticipated d/c is to: SNF              Patient currently medically stable for discharge.   Difficult to place patient : Yes   Consultants:   None  Procedures:   CT head CT C-spine 12/28/2020  CT head 12/27/2020  Chest x-ray 12/27/2020  Antimicrobials:  None  Subjective: Today, patient was seen and examined at bedside.  Denies abdominal complaints.  Feels hungry.   Objective: Vitals:   02/07/21 0805 02/07/21 1637 02/07/21 2007 02/08/21 0556  BP: 111/69 121/85 115/77 116/79  Pulse: 83 85 88 89  Resp: 20 (!) 22 18 18   Temp: 98.2  F (36.8 C) 97.9 F (36.6 C) 98.5 F (36.9 C) 98.7 F (37.1 C)  TempSrc: Oral     SpO2: 97% 100% 97% 96%  Weight:      Height:        Intake/Output Summary (Last 24 hours) at 02/08/2021 0917 Last data filed at 02/08/2021 0840 Gross per 24 hour  Intake 354 ml  Output --  Net 354 ml   Filed Weights   02/02/21 0500 02/06/21  0500 02/07/21 0422  Weight: 67.8 kg 73.4 kg 70.2 kg   Body mass index is 22.85 kg/m.   Physical examination: General:  Average built, not in obvious distress HENT:   No scleral pallor or icterus noted. Oral mucosa is moist.  Chest:  Clear breath sounds.  Diminished breath sounds bilaterally. No crackles or wheezes.  CVS: S1 &S2 heard. No murmur.  Regular rate and rhythm. Abdomen: Soft, nontender, nondistended.  Bowel sounds are heard.   Extremities: No cyanosis, clubbing or edema.  Peripheral pulses are palpable. Psych: Alert, awake and oriented, normal mood CNS:  No cranial nerve deficits.  Power equal in all extremities.   Skin: Warm and dry.  No rashes noted.   Data Reviewed: I have personally reviewed following labs and imaging studies  CBC: No results for input(s): WBC, NEUTROABS, HGB, HCT, MCV, PLT in the last 168 hours.  Basic Metabolic Panel: Recent Labs  Lab 02/02/21 0455 02/05/21 0426 02/07/21 0502 02/08/21 0522  NA  --  140  --  138  K  --  4.4  --  4.6  CL  --  104  --  104  CO2  --  28  --  26  GLUCOSE  --  95  --  100*  BUN  --  18  --  17  CREATININE  --  1.02  --  0.97  CALCIUM  --  9.9  --  9.8  MG 1.7 1.9 1.8  --   PHOS 5.2* 5.2* 4.8*  --     GFR: Estimated Creatinine Clearance: 81.4 mL/min (by C-G formula based on SCr of 0.97 mg/dL).  Liver Function Tests: Recent Labs  Lab 02/08/21 0522  AST 27  ALT 19  ALKPHOS 71  BILITOT 0.5  PROT 7.7  ALBUMIN 3.6    CBG: No results for input(s): GLUCAP in the last 168 hours.   No results found for this or any previous visit (from the past 240 hour(s)).     Radiology Studies: No results found.      Scheduled Meds: . carvedilol  6.25 mg Oral BID WC  . enoxaparin (LOVENOX) injection  40 mg Subcutaneous Q24H  . feeding supplement  237 mL Oral TID BM  . folic acid  1 mg Oral Daily  . lactulose  20 g Oral BID  . magnesium oxide  400 mg Oral BID  . multivitamin with minerals  1 tablet Oral  Daily  . QUEtiapine  25 mg Oral BID  . sodium chloride flush  3 mL Intravenous Q12H  . thiamine  100 mg Oral Daily  . vitamin B-12  1,000 mcg Oral Daily   Continuous Infusions:    LOS: 42 days    Joycelyn Das, MD [facility] 02/08/2021, 9:17 AM

## 2021-02-09 LAB — MAGNESIUM: Magnesium: 1.8 mg/dL (ref 1.7–2.4)

## 2021-02-09 LAB — PHOSPHORUS: Phosphorus: 5.3 mg/dL — ABNORMAL HIGH (ref 2.5–4.6)

## 2021-02-09 NOTE — TOC Progression Note (Signed)
Transition of Care [facility]) - Progression Note    Patient Details  Name: Alvin Clark MRN: [id number] Date of Birth: June 29, 1961  Transition of Care [facility]) CM/SW Contact  Darleene Cleaver, [place] Phone Number: 02/09/2021, 4:04 PM  Clinical Narrative:     CSW received phone call from patient's sister that patient has been approved for Medicaid.  However the Medicaid paperwork says Medicaid for family planning.  Patient needs the medical Medicaid.  CSW attempted to call DSS case worker, however CSW had to leave a message awaiting for call back.  CSW also attempted to contact Steward Drone [phone] from [facility] in [place], left a message awaiting for call back.  Per patient's sister, patient has a daughter who has been hospitalized for several days, and is patient's HCPOA, however daughter wants to defer decision making to patient's sister.  CSW informed patient's sister to see if daughter has HCPOA paperwork and she can send to this CSW to see if there is a way to change the HCPOA to patient's sister.  CSW also notified TOC lead Steward Drone to see if the hospital can assist with changing the HCPOA.  CSW to continue to follow patient's progress throughout discharge planning.   Expected Discharge Plan: Assisted Living (versus family care home) Barriers to Discharge: Financial Resources,Homeless with medical needs,Continued Medical Work up,No SNF bed,Requiring sitter/restraints,SNF Pending payor source - LOG,SNF Pending Medicaid,Family Issues  Expected Discharge Plan and Services Expected Discharge Plan: Assisted Living (versus family care home) In-house Referral: Clinical Social Work,Financial Counselor     Living arrangements for the past 2 months: No permanent address                                       Social Determinants of Health (SDOH) Interventions    Readmission Risk Interventions No flowsheet data found.

## 2021-02-09 NOTE — Progress Notes (Signed)
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 Reportedly trying to enter home that he was evicted  on 3/9, and homeowners called police when they found him disoriented. Patient was admitted to the hospital with diagnosis of acute metabolic encephalopathy, acute kidney injury and significant electrolyte imbalances.  Encephalopathy work-up was unrevealing. Required safety sitter for a long time due to high risk of fall and trying to jump out of the bed unexpectedly. Pending placement at this time.  Patient is difficult to place.   Assessment & Plan:   Principal Problem:   AKI (acute kidney injury) (HCC) Active Problems:   Confusion   Leukocytosis   Blurry vision, bilateral   Transaminitis   Constipation   Withdrawal symptoms, alcohol (HCC)   Protein-calorie malnutrition, severe   Acute metabolic encephalopathy   Hypokalemia   Hypomagnesemia   Hypophosphatemia   Fall   Debility   Low vitamin B12 level   Chronic alcohol use  Acute metabolic encephalopathy with underlying cognitive impairment. Likely secondary to alcohol abuse.  CT head was negative.  UDS, RPR was negative including TSH and vitamin B12.  Patient has clinically improved and is at his baseline.  Alert awake and oriented to self and place.  No focal neurological deficits.  Initially received high-dose IV thiamine and currently on oral thiamine.  Continue lactulose, Seroquel.  Continue Haldol as necessary.  Acute kidney injury/metabolic acidosis Has resolved at this time.  Latest creatinine of 0.9  Mildly elevated LFT Secondary to alcohol abuse.  Hepatitis panel was negative. Improved ALT and AST at this time.  Low normal vitamin B12, macrocytic  anemia Continue vitamin B12 supplementation  Chronic alcohol abuse Will continue thiamine, folic acid, multivitamin.  No signs of withdrawal   Hypokalemia/hypomagnesemia/hypophosphatemia Secondary to  alcohol abuse and malnutrition.  Has been replenished and has been normalized.    Elevated blood pressure/sinus tachycardia Currently on Coreg with control of heart rate and blood pressure  Debility/frequent falls/impaired mobility Seen by physical therapy, Occupational therapy during hospitalization and recommended skilled nursing facility placement.  Difficult to place due to lack of insurance.  Transition of care on board.  Constipation Improved on lactulose.    Severe protein calorie malnutrition Present on admission evidenced by significant muscle mass and subcutaneous fat loss.-BMI 22.46 kg/m.  Continue Ensure supplementation.   DVT prophylaxis:  Lovenox subcu  Code Status:  Full code   Family Communication:  None  Disposition:   Status is: Inpatient  Dispo: The patient is from: Home              Anticipated d/c is to: SNF              Patient currently medically stable for discharge.   Difficult to place patient : Yes   Consultants:   None  Procedures:   CT head CT C-spine 12/28/2020  CT head 12/27/2020  Chest x-ray 12/27/2020  Antimicrobials:  None  Subjective: Today, patient was seen and examined at bedside. No interval complaints reported.    Objective: Vitals:   02/08/21 1339 02/08/21 1520 02/08/21 2035 02/09/21 0512  BP: 113/63  117/70 102/62  Pulse: 87  84 80  Resp: (!) 22  17 17   Temp: 98.1 F (  36.7 C)  98.4 F (36.9 C) 98.1 F (36.7 C)  TempSrc:    Oral  SpO2: 97% 97% 98% 96%  Weight:      Height:        Intake/Output Summary (Last 24 hours) at 02/09/2021 1130 Last data filed at 02/09/2021 0700 Gross per 24 hour  Intake 240 ml  Output --  Net 240 ml   Filed Weights   02/02/21 0500 02/06/21 0500 02/07/21 0422  Weight: 67.8 kg  73.4 kg 70.2 kg   Body mass index is 22.85 kg/m.   Physical examination: General:  Average built, not in obvious distress HENT:   No scleral pallor or icterus noted. Oral mucosa is moist.  Chest:  Clear breath sounds.  Diminished breath sounds bilaterally. No crackles or wheezes.  CVS: S1 &S2 heard. No murmur.  Regular rate and rhythm. Abdomen: Soft, nontender, nondistended.  Bowel sounds are heard.   Extremities: No cyanosis, clubbing or edema.  Peripheral pulses are palpable. Psych: Alert, awake and oriented, normal mood CNS:  No cranial nerve deficits.  Power equal in all extremities.   Skin: Warm and dry.  No rashes noted.  Data Reviewed: I have personally reviewed following labs and imaging studies  CBC: No results for input(s): WBC, NEUTROABS, HGB, HCT, MCV, PLT in the last 168 hours.  Basic Metabolic Panel: Recent Labs  Lab 02/05/21 0426 02/07/21 0502 02/08/21 0522 02/09/21 0509  NA 140  --  138  --   K 4.4  --  4.6  --   CL 104  --  104  --   CO2 28  --  26  --   GLUCOSE 95  --  100*  --   BUN 18  --  17  --   CREATININE 1.02  --  0.97  --   CALCIUM 9.9  --  9.8  --   MG 1.9 1.8  --  1.8  PHOS 5.2* 4.8*  --  5.3*    GFR: Estimated Creatinine Clearance: 81.4 mL/min (by C-G formula based on SCr of 0.97 mg/dL).  Liver Function Tests: Recent Labs  Lab 02/08/21 0522  AST 27  ALT 19  ALKPHOS 71  BILITOT 0.5  PROT 7.7  ALBUMIN 3.6    CBG: No results for input(s): GLUCAP in the last 168 hours.   No results found for this or any previous visit (from the past 240 hour(s)).     Radiology Studies: No results found.      Scheduled Meds: . carvedilol  6.25 mg Oral BID WC  . enoxaparin (LOVENOX) injection  40 mg Subcutaneous Q24H  . feeding supplement  237 mL Oral TID BM  . folic acid  1 mg Oral Daily  . lactulose  20 g Oral BID  . magnesium oxide  400 mg Oral BID  . multivitamin with minerals  1 tablet Oral Daily  . QUEtiapine  25 mg Oral BID   . sodium chloride flush  3 mL Intravenous Q12H  . thiamine  100 mg Oral Daily  . vitamin B-12  1,000 mcg Oral Daily   Continuous Infusions:    LOS: 43 days    Joycelyn Das, MD [facility] 02/09/2021, 11:30 AM

## 2021-02-10 NOTE — Progress Notes (Signed)
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 Reportedly trying to enter home that he was evicted  on 3/9, and homeowners called police when they found him disoriented. Patient was admitted to the hospital with diagnosis of acute metabolic encephalopathy, acute kidney injury and significant electrolyte imbalances.  Encephalopathy work-up was unrevealing. Required safety sitter for a long time due to high risk of fall and trying to jump out of the bed unexpectedly. Pending placement at this time.  Patient is difficult to place.   Assessment & Plan:   Principal Problem:   AKI (acute kidney injury) (HCC) Active Problems:   Confusion   Leukocytosis   Blurry vision, bilateral   Transaminitis   Constipation   Withdrawal symptoms, alcohol (HCC)   Protein-calorie malnutrition, severe   Acute metabolic encephalopathy   Hypokalemia   Hypomagnesemia   Hypophosphatemia   Fall   Debility   Low vitamin B12 level   Chronic alcohol use  Acute metabolic encephalopathy with underlying cognitive impairment. Likely secondary to alcohol abuse.  CT head was negative.  UDS, RPR was negative including TSH and vitamin B12.  Patient has clinically improved and is at his baseline.  Alert awake and oriented to self and place.  No focal neurological deficits.  Initially received high-dose IV thiamine and currently on oral thiamine.  Continue lactulose, Seroquel.  Continue Haldol as necessary.  Acute kidney injury/metabolic acidosis Has resolved at this time.  Latest creatinine of 0.9  Mildly elevated LFT Secondary to alcohol abuse.  Hepatitis panel was negative. Improved ALT and AST   Low normal vitamin B12, macrocytic anemia Continue vitamin B12  supplementation  Chronic alcohol abuse Will continue thiamine, folic acid, multivitamin.  No signs of withdrawal   Hypokalemia/hypomagnesemia/hypophosphatemia Secondary to  alcohol abuse and malnutrition.  Has been replenished and has been normalized.    Elevated blood pressure/sinus tachycardia Currently on Coreg with control of heart rate and blood pressure  Debility/frequent falls/impaired mobility Seen by physical therapy, occupational therapy during hospitalization and recommended skilled nursing facility placement.  Difficult to place due to lack of insurance.  Transition of care on board.  Constipation Improved on lactulose.    Severe protein calorie malnutrition Present on admission evidenced by significant muscle mass and subcutaneous fat loss. BMI 22.46 kg/m.  Continue Ensure supplementation.   DVT prophylaxis:  Lovenox subcu  Code Status:  Full code   Family Communication:  None  Disposition:   Status is: Inpatient  Dispo: The patient is from: Home              Anticipated d/c is to: SNF              Patient is currently medically stable for discharge.   Difficult to place patient : Yes   Consultants:   None  Procedures:   CT head CT C-spine 12/28/2020  CT head 12/27/2020  Chest x-ray 12/27/2020  Antimicrobials:  None  Subjective: Today, patient was seen and examined at bedside.  Denies interval complaints.  Denies any pain nausea vomiting fever.   Objective: Vitals:   02/09/21 2054 02/10/21 0500 02/10/21 0507 02/10/21 0811  BP: 130/81  114/79 130/90  Pulse: 87  80 88  Resp: 18  18  Temp: 98.2 F (36.8 C)  97.8 F (36.6 C)   TempSrc: Oral  Oral   SpO2: 98%     Weight:  81 kg    Height:        Intake/Output Summary (Last 24 hours) at 02/10/2021 1016 Last data filed at 02/10/2021 0900 Gross per 24 hour  Intake 960 ml  Output --  Net 960 ml   Filed Weights   02/06/21 0500 02/07/21 0422 02/10/21 0500  Weight: 73.4 kg 70.2 kg 81 kg    Body mass index is 26.37 kg/m.   Physical examination: General:  Average built, not in obvious distress HENT:   No scleral pallor or icterus noted. Oral mucosa is moist.  Chest:  Clear breath sounds.  Diminished breath sounds bilaterally. No crackles or wheezes.  CVS: S1 &S2 heard. No murmur.  Regular rate and rhythm. Abdomen: Soft, nontender, nondistended.  Bowel sounds are heard.   Extremities: No cyanosis, clubbing or edema.  Peripheral pulses are palpable. Psych: Alert, awake and communicative, normal mood CNS:  No cranial nerve deficits.  Power equal in all extremities.   Skin: Warm and dry.  No rashes noted.  Data Reviewed: I have personally reviewed following labs and imaging studies  CBC: No results for input(s): WBC, NEUTROABS, HGB, HCT, MCV, PLT in the last 168 hours.  Basic Metabolic Panel: Recent Labs  Lab 02/05/21 0426 02/07/21 0502 02/08/21 0522 02/09/21 0509  NA 140  --  138  --   K 4.4  --  4.6  --   CL 104  --  104  --   CO2 28  --  26  --   GLUCOSE 95  --  100*  --   BUN 18  --  17  --   CREATININE 1.02  --  0.97  --   CALCIUM 9.9  --  9.8  --   MG 1.9 1.8  --  1.8  PHOS 5.2* 4.8*  --  5.3*    GFR: Estimated Creatinine Clearance: 82 mL/min (by C-G formula based on SCr of 0.97 mg/dL).  Liver Function Tests: Recent Labs  Lab 02/08/21 0522  AST 27  ALT 19  ALKPHOS 71  BILITOT 0.5  PROT 7.7  ALBUMIN 3.6    CBG: No results for input(s): GLUCAP in the last 168 hours.   No results found for this or any previous visit (from the past 240 hour(s)).     Radiology Studies: No results found.   Scheduled Meds: . carvedilol  6.25 mg Oral BID WC  . enoxaparin (LOVENOX) injection  40 mg Subcutaneous Q24H  . feeding supplement  237 mL Oral TID BM  . folic acid  1 mg Oral Daily  . lactulose  20 g Oral BID  . magnesium oxide  400 mg Oral BID  . multivitamin with minerals  1 tablet Oral Daily  . QUEtiapine  25 mg Oral BID  . sodium chloride  flush  3 mL Intravenous Q12H  . thiamine  100 mg Oral Daily  . vitamin B-12  1,000 mcg Oral Daily   Continuous Infusions:    LOS: 44 days    Joycelyn Das, MD [facility] 02/10/2021, 10:16 AM

## 2021-02-11 NOTE — Progress Notes (Signed)
PROGRESS NOTE    Alvin Clark  BDZ:[id number] DOB: September 07, 1961 DOA: 12/27/2020 PCP: Patient, No Pcp Per (Inactive)   Brief Narrative:   Patient is a 73 male with past medical history of alcohol abuse in cognitive decline secondary to alcohol was brought into the hospital by EMS with altered mental status. Reportedly trying to enter home that he was evicted  on 3/9, and homeowners called police when they found him disoriented. Patient was admitted to the hospital with diagnosis of acute metabolic encephalopathy, acute kidney injury and significant electrolyte imbalances.  Encephalopathy work-up was unrevealing. Required safety sitter for a long time due to high risk of fall and trying to jump out of the bed unexpectedly. Pending placement at this time.  Patient is difficult to place.   Assessment & Plan:   Principal Problem:   AKI (acute kidney injury) (HCC) Active Problems:   Confusion   Leukocytosis   Blurry vision, bilateral   Transaminitis   Constipation   Withdrawal symptoms, alcohol (HCC)   Protein-calorie malnutrition, severe   Acute metabolic encephalopathy   Hypokalemia   Hypomagnesemia   Hypophosphatemia   Fall   Debility   Low vitamin B12 level   Chronic alcohol use  Acute metabolic encephalopathy with underlying cognitive impairment. Likely secondary to alcohol abuse.  CT head was negative.  UDS, RPR was negative including TSH and vitamin B12.  Patient has clinically improved and is at his baseline.  Patient is alert awake and oriented to self and place.  No focal neurological deficits.  Initially received high-dose IV thiamine and currently on oral thiamine.  Continue lactulose, Seroquel.  Continue Haldol as necessary.  Acute kidney injury/metabolic acidosis Has resolved at this time.  Latest creatinine of 0.9  Mildly elevated LFT Secondary to alcohol abuse.  Hepatitis panel was negative. Improved ALT and AST   Low normal vitamin B12, macrocytic anemia Continue  vitamin B12 supplementation  Chronic alcohol abuse Will continue thiamine, folic acid, multivitamin.  No signs of withdrawal   Hypokalemia/hypomagnesemia/hypophosphatemia Secondary to  alcohol abuse and malnutrition.  Has been replenished and has been normalized.    Elevated blood pressure/sinus tachycardia Currently on Coreg with control of heart rate and blood pressure  Debility/frequent falls/impaired mobility Seen by physical therapy, occupational therapy during hospitalization and recommended skilled nursing facility placement.  Difficult to place due to lack of insurance.  Transition of care on board.  Constipation Improved on lactulose.    Severe protein calorie malnutrition Present on admission evidenced by significant muscle mass and subcutaneous fat loss. BMI 22.46 kg/m.  Continue Ensure supplementation.   DVT prophylaxis:  Lovenox subcu  Code Status:  Full code   Family Communication:  None  Disposition:   Status is: Inpatient  Dispo: The patient is from: Home              Anticipated d/c is to: SNF              Patient is currently medically stable for discharge.   Difficult to place patient : Yes   Consultants:   None  Procedures:   CT head CT C-spine 12/28/2020  CT head 12/27/2020  Chest x-ray 12/27/2020  Antimicrobials:  None  Subjective: Today, patient was seen and examined at bedside.  Denies any fever, chills or rigor.  No interval complaints reported.   Objective: Vitals:   02/10/21 1630 02/10/21 2139 02/11/21 0500 02/11/21 0542  BP: 131/86 123/84  113/83  Pulse: 86 85  88  Resp:  17  16  Temp:  98.9 F (37.2 C)  97.7 F (36.5 C)  TempSrc:    Oral  SpO2:  95%  96%  Weight:   70.9 kg   Height:        Intake/Output Summary (Last 24 hours) at 02/11/2021 1054 Last data filed at 02/11/2021 0900 Gross per 24 hour  Intake 1440 ml  Output --  Net 1440 ml   Filed Weights   02/07/21 0422 02/10/21 0500 02/11/21 0500  Weight: 70.2 kg  81 kg 70.9 kg   Body mass index is 23.08 kg/m.   Physical examination: General:  Average built, not in obvious distress HENT:   No scleral pallor or icterus noted. Oral mucosa is moist.  Chest:  Clear breath sounds.  Diminished breath sounds bilaterally. No crackles or wheezes.  CVS: S1 &S2 heard. No murmur.  Regular rate and rhythm. Abdomen: Soft, nontender, nondistended.  Bowel sounds are heard.   Extremities: No cyanosis, clubbing or edema.  Peripheral pulses are palpable. Psych: Alert, awake and oriented to self and place, communicative  CNS:  No cranial nerve deficits.  Power equal in all extremities.   Skin: Warm and dry.  No rashes noted.    Data Reviewed: I have personally reviewed following labs and imaging studies  CBC: No results for input(s): WBC, NEUTROABS, HGB, HCT, MCV, PLT in the last 168 hours.  Basic Metabolic Panel: Recent Labs  Lab 02/05/21 0426 02/07/21 0502 02/08/21 0522 02/09/21 0509  NA 140  --  138  --   K 4.4  --  4.6  --   CL 104  --  104  --   CO2 28  --  26  --   GLUCOSE 95  --  100*  --   BUN 18  --  17  --   CREATININE 1.02  --  0.97  --   CALCIUM 9.9  --  9.8  --   MG 1.9 1.8  --  1.8  PHOS 5.2* 4.8*  --  5.3*    GFR: Estimated Creatinine Clearance: 82 mL/min (by C-G formula based on SCr of 0.97 mg/dL).  Liver Function Tests: Recent Labs  Lab 02/08/21 0522  AST 27  ALT 19  ALKPHOS 71  BILITOT 0.5  PROT 7.7  ALBUMIN 3.6    CBG: No results for input(s): GLUCAP in the last 168 hours.   No results found for this or any previous visit (from the past 240 hour(s)).     Radiology Studies: No results found.   Scheduled Meds: . carvedilol  6.25 mg Oral BID WC  . enoxaparin (LOVENOX) injection  40 mg Subcutaneous Q24H  . feeding supplement  237 mL Oral TID BM  . folic acid  1 mg Oral Daily  . lactulose  20 g Oral BID  . magnesium oxide  400 mg Oral BID  . multivitamin with minerals  1 tablet Oral Daily  . QUEtiapine  25  mg Oral BID  . sodium chloride flush  3 mL Intravenous Q12H  . thiamine  100 mg Oral Daily  . vitamin B-12  1,000 mcg Oral Daily   Continuous Infusions:    LOS: 45 days    Joycelyn Das, MD [facility] 02/11/2021, 10:54 AM

## 2021-02-12 LAB — MAGNESIUM: Magnesium: 2 mg/dL (ref 1.7–2.4)

## 2021-02-12 LAB — PHOSPHORUS: Phosphorus: 4.9 mg/dL — ABNORMAL HIGH (ref 2.5–4.6)

## 2021-02-12 NOTE — Progress Notes (Signed)
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 Reportedly trying to enter home that he was evicted  on 3/9, and homeowners called police when they found him disoriented. Patient was admitted to the hospital with diagnosis of acute metabolic encephalopathy, acute kidney injury and significant electrolyte imbalances.  Encephalopathy work-up was unrevealing. Required safety sitter for a long time due to high risk of fall and trying to jump out of the bed unexpectedly. Pending placement at this time.  Patient is difficult to place.   Assessment & Plan:   Principal Problem:   AKI (acute kidney injury) (HCC) Active Problems:   Confusion   Leukocytosis   Blurry vision, bilateral   Transaminitis   Constipation   Withdrawal symptoms, alcohol (HCC)   Protein-calorie malnutrition, severe   Acute metabolic encephalopathy   Hypokalemia   Hypomagnesemia   Hypophosphatemia   Fall   Debility   Low vitamin B12 level   Chronic alcohol use  Acute metabolic encephalopathy with underlying cognitive impairment. Stable at this time.  Likely secondary to alcohol abuse.  CT head was negative.  UDS, RPR was negative including TSH and vitamin B12.  Patient has clinically improved and is at his baseline.  Patient is alert awake and oriented to self and place.  No focal neurological deficits.  Initially received high-dose IV thiamine and currently on oral thiamine.  Continue lactulose, Seroquel.  Continue Haldol as necessary.  Acute kidney injury/metabolic acidosis  Latest creatinine of 0.9  Mildly elevated LFT Secondary to alcohol abuse.  Hepatitis panel was negative. Improved ALT and AST   Low normal vitamin B12, macrocytic anemia Continue  vitamin B12 supplementation  Chronic alcohol abuse Will continue thiamine, folic acid, multivitamin.  No signs of withdrawal   Hypokalemia/hypomagnesemia/hypophosphatemia Improved after replacement.   Elevated blood pressure/sinus tachycardia Currently on Coreg with control of heart rate and blood pressure  Debility/frequent falls/impaired mobility Seen by physical therapy, occupational therapy during hospitalization and recommended skilled nursing facility placement.  Difficult to place due to lack of insurance.  Transition of care on board.  Constipation Continue lactulose.    Severe protein calorie malnutrition Continue Ensure supplementation.   DVT prophylaxis:  Lovenox subcu  Code Status:  Full code   Family Communication:  None  Disposition:   Status is: Inpatient  Dispo: The patient is from: Home              Anticipated d/c is to: SNF              Patient is currently medically stable for discharge.   Difficult to place patient : Yes   Consultants:   None  Procedures:   CT head CT C-spine 12/28/2020  CT head 12/27/2020  Chest x-ray 12/27/2020  Antimicrobials:  None  Subjective: Today, patient was seen and examined at bedside and denies interval complaints.  No fever chills shortness of breath   Objective: Vitals:   02/11/21 1359 02/11/21 2114 02/12/21 0530 02/12/21 0900  BP: 118/82 125/73 121/86 116/76  Pulse: 98 93 91 83  Resp: 14 15 18 17   Temp: (!) 97.5 F (36.4 C) 97.8 F (36.6 C) 98.3 F (36.8 C)   TempSrc:  Oral Oral   SpO2: 97% 98% 98%  Weight:   72 kg   Height:        Intake/Output Summary (Last 24 hours) at 02/12/2021 1128 Last data filed at 02/11/2021 1832 Gross per 24 hour  Intake 1200 ml  Output --  Net 1200 ml   Filed Weights   02/10/21 0500 02/11/21 0500 02/12/21 0530  Weight: 81 kg 70.9 kg 72 kg   Body mass index is 23.44 kg/m.   Physical examination: General:  Average built, not in obvious distress HENT:   No  scleral pallor or icterus noted. Oral mucosa is moist.  Chest:  Clear breath sounds.  Diminished breath sounds bilaterally. No crackles or wheezes.  CVS: S1 &S2 heard. No murmur.  Regular rate and rhythm. Abdomen: Soft, nontender, nondistended.  Bowel sounds are heard.   Extremities: No cyanosis, clubbing or edema.  Peripheral pulses are palpable. Psych: Alert, awake and oriented to self and place, normal mood CNS:  No cranial nerve deficits.  Power equal in all extremities.   Skin: Warm and dry.  No rashes noted.   Data Reviewed: I have personally reviewed following labs and imaging studies  CBC: No results for input(s): WBC, NEUTROABS, HGB, HCT, MCV, PLT in the last 168 hours.  Basic Metabolic Panel: Recent Labs  Lab 02/07/21 0502 02/08/21 0522 02/09/21 0509 02/12/21 0549  NA  --  138  --   --   K  --  4.6  --   --   CL  --  104  --   --   CO2  --  26  --   --   GLUCOSE  --  100*  --   --   BUN  --  17  --   --   CREATININE  --  0.97  --   --   CALCIUM  --  9.8  --   --   MG 1.8  --  1.8 2.0  PHOS 4.8*  --  5.3* 4.9*    GFR: Estimated Creatinine Clearance: 82 mL/min (by C-G formula based on SCr of 0.97 mg/dL).  Liver Function Tests: Recent Labs  Lab 02/08/21 0522  AST 27  ALT 19  ALKPHOS 71  BILITOT 0.5  PROT 7.7  ALBUMIN 3.6    CBG: No results for input(s): GLUCAP in the last 168 hours.   No results found for this or any previous visit (from the past 240 hour(s)).     Radiology Studies: No results found.   Scheduled Meds: . carvedilol  6.25 mg Oral BID WC  . enoxaparin (LOVENOX) injection  40 mg Subcutaneous Q24H  . feeding supplement  237 mL Oral TID BM  . folic acid  1 mg Oral Daily  . lactulose  20 g Oral BID  . magnesium oxide  400 mg Oral BID  . multivitamin with minerals  1 tablet Oral Daily  . QUEtiapine  25 mg Oral BID  . sodium chloride flush  3 mL Intravenous Q12H  . thiamine  100 mg Oral Daily  . vitamin B-12  1,000 mcg Oral Daily    Continuous Infusions:    LOS: 46 days    Joycelyn Das, MD [facility] 02/12/2021, 11:28 AM

## 2021-02-13 NOTE — Progress Notes (Signed)
PROGRESS NOTE    Alvin Clark  FVC:[id number] DOB: 1961-02-03 DOA: 12/27/2020 PCP: Patient, No Pcp Per (Inactive)   Brief Narrative:  Patient is a 32 male with past medical history of alcohol abuse in cognitive decline secondary to alcohol was brought into the hospital by EMS with altered mental status. Reportedly trying to enter home that he was evicted  on 3/9, and homeowners called police when they found him disoriented. Patient was admitted to the hospital with diagnosis of acute metabolic encephalopathy, acute kidney injury and significant electrolyte imbalances.  Encephalopathy work-up was unrevealing. Required safety sitter for a long time due to high risk of fall and trying to jump out of the bed unexpectedly. Pending placement at this time.  Patient is difficult to place.   Assessment & Plan:   Principal Problem:   AKI (acute kidney injury) (HCC) Active Problems:   Confusion   Leukocytosis   Blurry vision, bilateral   Transaminitis   Constipation   Withdrawal symptoms, alcohol (HCC)   Protein-calorie malnutrition, severe   Acute metabolic encephalopathy   Hypokalemia   Hypomagnesemia   Hypophosphatemia   Fall   Debility   Low vitamin B12 level   Chronic alcohol use  Acute metabolic encephalopathy with underlying cognitive impairment. Improved and is a stable at this time.  Likely secondary to alcohol abuse.  CT head was negative.  UDS, RPR was negative including TSH and vitamin B12.   Patient is alert, awake and oriented to self and place.  No focal neurological deficits.  Initially received high-dose IV thiamine and currently on oral thiamine.  Continue lactulose, Seroquel.  Continue Haldol as necessary.  Acute kidney injury/metabolic acidosis  Latest creatinine of 0.9  Mildly elevated LFT Secondary to alcohol abuse.  Hepatitis panel was negative. Improved ALT and AST   Low normal vitamin B12, macrocytic anemia Continue vitamin B12 supplementation  Chronic  alcohol abuse Will continue thiamine, folic acid, multivitamin.  No signs of withdrawal   Hypokalemia/hypomagnesemia/hypophosphatemia Improved after replacement.   Elevated blood pressure/sinus tachycardia Currently on Coreg with control of heart rate and blood pressure  Debility/frequent falls/impaired mobility Seen by physical therapy, occupational therapy during hospitalization and recommended skilled nursing facility placement.  Difficult to place due to lack of insurance.  Transition of care on board.  Constipation Continue lactulose.    Severe protein calorie malnutrition Continue Ensure supplementation.   DVT prophylaxis:  Lovenox subcu  Code Status:  Full code   Family Communication:  None  Disposition:   Status is: Inpatient  Dispo: The patient is from: Home              Anticipated d/c is to: SNF              Patient is currently medically stable for discharge.   Difficult to place patient : Yes   Consultants:   None  Procedures:   CT head CT C-spine 12/28/2020  CT head 12/27/2020  Chest x-ray 12/27/2020  Antimicrobials:  None  Subjective: Today, patient was seen and examined at bedside, denies any nausea vomiting fever chills or shortness of breath.  Objective: Vitals:   02/12/21 1957 02/13/21 0607 02/13/21 0620 02/13/21 0829  BP: 118/77 119/69  109/73  Pulse: 86 73  84  Resp: 18 18    Temp: 98.3 F (36.8 C) 98.7 F (37.1 C)    TempSrc: Oral Oral    SpO2:  98%    Weight:   71.6 kg   Height:  Intake/Output Summary (Last 24 hours) at 02/13/2021 1006 Last data filed at 02/13/2021 0901 Gross per 24 hour  Intake 920 ml  Output --  Net 920 ml   Filed Weights   02/11/21 0500 02/12/21 0530 02/13/21 0620  Weight: 70.9 kg 72 kg 71.6 kg   Body mass index is 23.31 kg/m.   Physical examination: General:  Average built, not in obvious distress HENT:   No scleral pallor or icterus noted. Oral mucosa is moist.  Chest:  Clear breath  sounds.  Diminished breath sounds bilaterally. No crackles or wheezes.  CVS: S1 &S2 heard. No murmur.  Regular rate and rhythm. Abdomen: Soft, nontender, nondistended.  Bowel sounds are heard.   Extremities: No cyanosis, clubbing or edema.  Peripheral pulses are palpable. Psych: Alert, awake and oriented self and place, normal mood CNS:  No cranial nerve deficits.  Power equal in all extremities.   Skin: Warm and dry.  No rashes noted.    Data Reviewed: I have personally reviewed following labs and imaging studies  CBC: No results for input(s): WBC, NEUTROABS, HGB, HCT, MCV, PLT in the last 168 hours.  Basic Metabolic Panel: Recent Labs  Lab 02/07/21 0502 02/08/21 0522 02/09/21 0509 02/12/21 0549  NA  --  138  --   --   K  --  4.6  --   --   CL  --  104  --   --   CO2  --  26  --   --   GLUCOSE  --  100*  --   --   BUN  --  17  --   --   CREATININE  --  0.97  --   --   CALCIUM  --  9.8  --   --   MG 1.8  --  1.8 2.0  PHOS 4.8*  --  5.3* 4.9*    GFR: Estimated Creatinine Clearance: 82 mL/min (by C-G formula based on SCr of 0.97 mg/dL).  Liver Function Tests: Recent Labs  Lab 02/08/21 0522  AST 27  ALT 19  ALKPHOS 71  BILITOT 0.5  PROT 7.7  ALBUMIN 3.6    CBG: No results for input(s): GLUCAP in the last 168 hours.   No results found for this or any previous visit (from the past 240 hour(s)).     Radiology Studies: No results found.   Scheduled Meds: . carvedilol  6.25 mg Oral BID WC  . enoxaparin (LOVENOX) injection  40 mg Subcutaneous Q24H  . feeding supplement  237 mL Oral TID BM  . folic acid  1 mg Oral Daily  . lactulose  20 g Oral BID  . magnesium oxide  400 mg Oral BID  . multivitamin with minerals  1 tablet Oral Daily  . QUEtiapine  25 mg Oral BID  . sodium chloride flush  3 mL Intravenous Q12H  . thiamine  100 mg Oral Daily  . vitamin B-12  1,000 mcg Oral Daily   Continuous Infusions:    LOS: 47 days    Joycelyn Das, MD [facility] 02/13/2021, 10:06 AM

## 2021-02-13 NOTE — Progress Notes (Signed)
Pt ambulated  The full length of hall with walker and RN.

## 2021-02-14 NOTE — Progress Notes (Signed)
[facility] PROGRESS NOTE    Progress Note  Alvin Clark  YTK:[id number] DOB: Mar 01, 1961 DOA: 12/27/2020 PCP: Patient, No Pcp Per (Inactive)     Brief Narrative:   Alvin Clark is an 60 y.o. 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  He is difficult to place and awaiting skilled nursing facility placement.    Assessment/Plan:   Acute metabolic encephalopathy with underlying cognitive impairment: Probably secondary to alcohol abuse. CT scan is negative, UDS on file TSH unremarkable.  B12 was noted to normal limits. Patient is clinically improved since admission is at baseline.  Awake alert and oriented to self place and time. Continue lactulose and Seroquel.  Acute kidney injury/metabolic acidosis: Likely prerenal azotemia resolved with IV fluid hydration.  Mildly elevated LFTs: Secondary to alcohol abuse work-up was negative.  Microcytic anemia B12 was low to normal, continue supplementation.  Chronic, alcohol use: Continue thiamine folate and multivitamins also  Electrolyte imbalance/hypokalemia/hypomagnesemia/hypophosphatemia, Improved with replacement.  Elevated blood pressure without a diagnosis of hypertension: He was started on Coreg as he has sinus tach heart rate is also improved.  Debilitated/frequent falls/impaired mobility: Seen by physical therapy and Occupational Therapy recommended skilled nursing facility placement.  Constipation: Continue lactulose. Supratentorial malnutrition: Sign continue Ensure.   DVT prophylaxis: lovenox Family Communication:none Status is: Inpatient  Remains inpatient appropriate because:Patient is medically stable for skilled nursing facility transfer.   Dispo: The patient is from: Home              Anticipated d/c is to: SNF               Patient currently is medically stable to d/c.   Difficult to place patient Yes        Code Status:     Code Status Orders  (From admission, onward)         Start     Ordered   12/27/20 2006  Full code  Continuous        12/27/20 2008        Code Status History    This patient has a current code status but no historical code status.   Advance Care Planning Activity        IV Access:    Peripheral IV   Procedures and diagnostic studies:   No results found.   Medical Consultants:    None.   Subjective:    Alvin Clark no complaints feels great  Objective:    Vitals:   02/13/21 1203 02/13/21 1858 02/13/21 1943 02/14/21 0510  BP: 117/73 117/76 121/86 119/74  Pulse: 84 88 88 78  Resp: 16  20 18   Temp: 97.6 F (36.4 C)  98.2 F (36.8 C) 97.8 F (36.6 C)  TempSrc: Oral  Oral Oral  SpO2: 99%  97% 96%  Weight:    73.3 kg  Height:       SpO2: 96 %   Intake/Output Summary (Last 24 hours) at 02/14/2021 0848 Last data filed at 02/13/2021 0901 Gross per 24 hour  Intake 200 ml  Output --  Net 200 ml   Filed Weights   02/12/21 0530 02/13/21 0620 02/14/21 0510  Weight: 72 kg 71.6 kg 73.3 kg    Exam: General exam: In no acute distress. Respiratory system: Good air movement and clear to auscultation. Cardiovascular system: S1 &  S2 heard, RRR. No JVD. Gastrointestinal system: Abdomen is nondistended, soft and nontender.  Extremities: No pedal edema. Skin: No rashes, lesions or ulcers Psychiatry: Judgement and insight appear normal. Mood & affect appropriate.    Data Reviewed:    Labs: Basic Metabolic Panel: Recent Labs  Lab 02/08/21 0522 02/09/21 0509 02/12/21 0549  NA 138  --   --   K 4.6  --   --   CL 104  --   --   CO2 26  --   --   GLUCOSE 100*  --   --   BUN 17  --   --   CREATININE 0.97  --   --   CALCIUM 9.8  --   --   MG  --  1.8 2.0  PHOS  --  5.3* 4.9*   GFR Estimated Creatinine Clearance: 82 mL/min (by C-G  formula based on SCr of 0.97 mg/dL). Liver Function Tests: Recent Labs  Lab 02/08/21 0522  AST 27  ALT 19  ALKPHOS 71  BILITOT 0.5  PROT 7.7  ALBUMIN 3.6   No results for input(s): LIPASE, AMYLASE in the last 168 hours. No results for input(s): AMMONIA in the last 168 hours. Coagulation profile No results for input(s): INR, PROTIME in the last 168 hours. COVID-19 Labs  No results for input(s): DDIMER, FERRITIN, LDH, CRP in the last 72 hours.  Lab Results  Component Value Date   SARSCOV2NAA NEGATIVE 01/11/2021   SARSCOV2NAA NEGATIVE 12/28/2020    CBC: No results for input(s): WBC, NEUTROABS, HGB, HCT, MCV, PLT in the last 168 hours. Cardiac Enzymes: No results for input(s): CKTOTAL, CKMB, CKMBINDEX, TROPONINI in the last 168 hours. BNP (last 3 results) No results for input(s): PROBNP in the last 8760 hours. CBG: No results for input(s): GLUCAP in the last 168 hours. D-Dimer: No results for input(s): DDIMER in the last 72 hours. Hgb A1c: No results for input(s): HGBA1C in the last 72 hours. Lipid Profile: No results for input(s): CHOL, HDL, LDLCALC, TRIG, CHOLHDL, LDLDIRECT in the last 72 hours. Thyroid function studies: No results for input(s): TSH, T4TOTAL, T3FREE, THYROIDAB in the last 72 hours.  Invalid input(s): FREET3 Anemia work up: No results for input(s): VITAMINB12, FOLATE, FERRITIN, TIBC, IRON, RETICCTPCT in the last 72 hours. Sepsis Labs: No results for input(s): PROCALCITON, WBC, LATICACIDVEN in the last 168 hours. Microbiology No results found for this or any previous visit (from the past 240 hour(s)).   Medications:   . carvedilol  6.25 mg Oral BID WC  . enoxaparin (LOVENOX) injection  40 mg Subcutaneous Q24H  . feeding supplement  237 mL Oral TID BM  . folic acid  1 mg Oral Daily  . lactulose  20 g Oral BID  . magnesium oxide  400 mg Oral BID  . multivitamin with minerals  1 tablet Oral Daily  . QUEtiapine  25 mg Oral BID  . sodium chloride  flush  3 mL Intravenous Q12H  . thiamine  100 mg Oral Daily  . vitamin B-12  1,000 mcg Oral Daily   Continuous Infusions:    LOS: 48 days   Marinda Elk  [facility]  02/14/2021, 8:48 AM

## 2021-02-14 NOTE — TOC Progression Note (Signed)
Transition of Care [facility]) - Progression Note    Patient Details  Name: Alvin Clark MRN: [id number] Date of Birth: 1961-07-12  Transition of Care [facility]) CM/SW Contact  Darleene Cleaver, [place] Phone Number: 02/13/2021, 12:53 PM  Clinical Narrative:     CSW spoke to [facility] worker Clayborne Artist, [phone] and explained that patient was approved for family planning Medicaid, but not medical Medicaid or special assistance Medicaid which is what he needs in order to go to an ALF or family care home.  Per Antonieta Loveless, she will investigate and find out why he was not approved the correct Medicaid and she will contact CSW back.  CSW to continue to follow patient's progress throughout discharge planning.    Expected Discharge Plan: Assisted Living (versus family care home) Barriers to Discharge: Financial Resources,Homeless with medical needs,Continued Medical Work up,No SNF bed,Requiring sitter/restraints,SNF Pending payor source - LOG,SNF Pending Medicaid,Family Issues  Expected Discharge Plan and Services Expected Discharge Plan: Assisted Living (versus family care home) In-house Referral: Clinical Social Work,Financial Counselor     Living arrangements for the past 2 months: No permanent address                                       Social Determinants of Health (SDOH) Interventions    Readmission Risk Interventions No flowsheet data found.

## 2021-02-15 NOTE — Progress Notes (Signed)
[facility] PROGRESS NOTE    Progress Note  KAMREN HEINTZELMAN  IHW:[id number] DOB: March 30, 1961 DOA: 12/27/2020 PCP: Patient, No Pcp Per (Inactive)     Brief Narrative:   ARMAN LOY is an 60 y.o. 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  He is difficult to place and awaiting skilled nursing facility placement.    Assessment/Plan:   Acute metabolic encephalopathy with underlying cognitive impairment: Probably secondary to alcohol abuse. CT scan is negative, UDS on file TSH unremarkable.  B12 was noted to normal limits. Patient is clinically improved since admission is at baseline.  Awake alert and oriented to self place and time. Continue lactulose and Seroquel.  Acute kidney injury/metabolic acidosis: Likely prerenal azotemia resolved with IV fluid hydration.  Mildly elevated LFTs: Secondary to alcohol abuse work-up was negative.  Microcytic anemia B12 was low to normal, continue supplementation.  Chronic, alcohol use: Continue thiamine folate and multivitamins also  Electrolyte imbalance/hypokalemia/hypomagnesemia/hypophosphatemia, Improved with replacement.  Elevated blood pressure without a diagnosis of hypertension: He was started on Coreg as he has sinus tach heart rate is also improved.  Debilitated/frequent falls/impaired mobility: Seen by physical therapy and Occupational Therapy recommended skilled nursing facility placement.  Constipation: Continue lactulose. Supratentorial malnutrition: Sign continue Ensure.   DVT prophylaxis: lovenox Family Communication:none Status is: Inpatient  Remains inpatient appropriate because:Patient is medically stable for skilled nursing facility transfer.   Dispo: The patient is from: Home              Anticipated d/c is to: SNF               Patient currently is medically stable to d/c.   Difficult to place patient Yes        Code Status:     Code Status Orders  (From admission, onward)         Start     Ordered   12/27/20 2006  Full code  Continuous        12/27/20 2008        Code Status History    This patient has a current code status but no historical code status.   Advance Care Planning Activity        IV Access:    Peripheral IV   Procedures and diagnostic studies:   No results found.   Medical Consultants:    None.   Subjective:    ANAY RATHE has no new complaints.  Objective:    Vitals:   02/14/21 1329 02/14/21 1737 02/14/21 1958 02/15/21 0548  BP: 118/82 130/83 125/87 115/88  Pulse: 89 93 99 78  Resp: 16  19 19   Temp: 98.4 F (36.9 C)  98.6 F (37 C) 98.1 F (36.7 C)  TempSrc: Oral  Oral Oral  SpO2: 100%  94% 97%  Weight:    70.5 kg  Height:       SpO2: 97 %   Intake/Output Summary (Last 24 hours) at 02/15/2021 0817 Last data filed at 02/14/2021 2100 Gross per 24 hour  Intake 1894 ml  Output --  Net 1894 ml   Filed Weights   02/13/21 0620 02/14/21 0510 02/15/21 0548  Weight: 71.6 kg 73.3 kg 70.5 kg    Exam: General exam: In no acute distress. Respiratory system: Good air movement and clear to auscultation. Cardiovascular system: S1 &  S2 heard, RRR. No JVD. Gastrointestinal system: Abdomen is nondistended, soft and nontender.  Extremities: No pedal edema. Skin: No rashes, lesions or ulcers Psychiatry: Judgement and insight appear normal. Mood & affect appropriate.    Data Reviewed:    Labs: Basic Metabolic Panel: Recent Labs  Lab 02/09/21 0509 02/12/21 0549  MG 1.8 2.0  PHOS 5.3* 4.9*   GFR Estimated Creatinine Clearance: 81.8 mL/min (by C-G formula based on SCr of 0.97 mg/dL). Liver Function Tests: No results for input(s): AST, ALT, ALKPHOS, BILITOT, PROT, ALBUMIN in the last 168 hours. No results for input(s): LIPASE, AMYLASE  in the last 168 hours. No results for input(s): AMMONIA in the last 168 hours. Coagulation profile No results for input(s): INR, PROTIME in the last 168 hours. COVID-19 Labs  No results for input(s): DDIMER, FERRITIN, LDH, CRP in the last 72 hours.  Lab Results  Component Value Date   SARSCOV2NAA NEGATIVE 01/11/2021   SARSCOV2NAA NEGATIVE 12/28/2020    CBC: No results for input(s): WBC, NEUTROABS, HGB, HCT, MCV, PLT in the last 168 hours. Cardiac Enzymes: No results for input(s): CKTOTAL, CKMB, CKMBINDEX, TROPONINI in the last 168 hours. BNP (last 3 results) No results for input(s): PROBNP in the last 8760 hours. CBG: No results for input(s): GLUCAP in the last 168 hours. D-Dimer: No results for input(s): DDIMER in the last 72 hours. Hgb A1c: No results for input(s): HGBA1C in the last 72 hours. Lipid Profile: No results for input(s): CHOL, HDL, LDLCALC, TRIG, CHOLHDL, LDLDIRECT in the last 72 hours. Thyroid function studies: No results for input(s): TSH, T4TOTAL, T3FREE, THYROIDAB in the last 72 hours.  Invalid input(s): FREET3 Anemia work up: No results for input(s): VITAMINB12, FOLATE, FERRITIN, TIBC, IRON, RETICCTPCT in the last 72 hours. Sepsis Labs: No results for input(s): PROCALCITON, WBC, LATICACIDVEN in the last 168 hours. Microbiology No results found for this or any previous visit (from the past 240 hour(s)).   Medications:   . carvedilol  6.25 mg Oral BID WC  . enoxaparin (LOVENOX) injection  40 mg Subcutaneous Q24H  . feeding supplement  237 mL Oral TID BM  . folic acid  1 mg Oral Daily  . lactulose  20 g Oral BID  . magnesium oxide  400 mg Oral BID  . multivitamin with minerals  1 tablet Oral Daily  . QUEtiapine  25 mg Oral BID  . sodium chloride flush  3 mL Intravenous Q12H  . thiamine  100 mg Oral Daily  . vitamin B-12  1,000 mcg Oral Daily   Continuous Infusions:    LOS: 49 days   Marinda Elk  [facility]  02/15/2021, 8:17  AM

## 2021-02-15 NOTE — TOC Progression Note (Signed)
Transition of Care [facility]) - Progression Note    Patient Details  Name: BRANTLY KALMAN MRN: [id number] Date of Birth: 1960-11-25  Transition of Care [facility]) CM/SW Contact  Ida Rogue, [place] Phone Number: 02/15/2021, 4:33 PM  Clinical Narrative:   Spoke with Ms Omelia Blackwater who states case was turned over to adult worker, Ms Apolonio Schneiders [phone], and that she will send an email to both Ms Lucien Mons and myself with formal hand off. TOC will continue to follow during the course of hospitalization.     Expected Discharge Plan: Assisted Living (versus family care home) Barriers to Discharge: Financial Resources,Homeless with medical needs,Continued Medical Work up,No SNF bed,Requiring sitter/restraints,SNF Pending payor source - LOG,SNF Pending Medicaid,Family Issues  Expected Discharge Plan and Services Expected Discharge Plan: Assisted Living (versus family care home) In-house Referral: Clinical Social Work,Financial Counselor     Living arrangements for the past 2 months: No permanent address                                       Social Determinants of Health (SDOH) Interventions    Readmission Risk Interventions No flowsheet data found.

## 2021-02-16 NOTE — TOC Progression Note (Signed)
Transition of Care [facility]) - Progression Note    Patient Details  Name: MIHRAN LEBARRON MRN: [id number] Date of Birth: Jul 19, 1961  Transition of Care [facility]) CM/SW Contact  Darleene Cleaver, [place] Phone Number: 02/16/2021, 2:35 PM  Clinical Narrative:     CSW received an e-mail from [facility] worker Drenda Freeze, [phone], that she will need a consent signed by patient's sister Toula Moos' Tonya R. Ranum in order for Medicaid office to share information with this CSW.  CSW attempted to contact patient's sister, and left a message on he voice mail.  CSW also e-mailed patient's sister a [facility] Disclosure form for her to sign in order for CSW to speak to Medicaid.  CSW to continue to follow patient's progress throughout discharge planning.  Expected Discharge Plan: Assisted Living (versus family care home) Barriers to Discharge: Financial Resources,Homeless with medical needs,Continued Medical Work up,No SNF bed,Requiring sitter/restraints,SNF Pending payor source - LOG,SNF Pending Medicaid,Family Issues  Expected Discharge Plan and Services Expected Discharge Plan: Assisted Living (versus family care home) In-house Referral: Clinical Social Work,Financial Counselor     Living arrangements for the past 2 months: No permanent address                                       Social Determinants of Health (SDOH) Interventions    Readmission Risk Interventions No flowsheet data found.

## 2021-02-16 NOTE — Progress Notes (Signed)
[facility] PROGRESS NOTE    Progress Note  Alvin Clark  RUE:[id number] DOB: November 24, 1960 DOA: 12/27/2020 PCP: Patient, No Pcp Per (Inactive)     Brief Narrative:   Alvin Clark is an 60 y.o. 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  He is difficult to place and awaiting skilled nursing facility placement.  Assessment/Plan:   Acute metabolic encephalopathy with underlying cognitive impairment: Probably secondary to alcohol abuse. CT scan is negative, UDS on file TSH unremarkable.  B12 was noted to normal limits. Patient is clinically improved since admission is at baseline.  Awake alert and oriented to self place and time. Continue lactulose and Seroquel.  Acute kidney injury/metabolic acidosis: Likely prerenal azotemia resolved with IV fluid hydration.  Mildly elevated LFTs: Secondary to alcohol abuse work-up was negative.  Microcytic anemia B12 was low to normal, continue supplementation.  Chronic, alcohol use: Continue thiamine folate and multivitamins also  Electrolyte imbalance/hypokalemia/hypomagnesemia/hypophosphatemia, Improved with replacement.  Elevated blood pressure without a diagnosis of hypertension: He was started on Coreg as he has sinus tach heart rate is also improved.  Debilitated/frequent falls/impaired mobility: Seen by physical therapy and Occupational Therapy recommended skilled nursing facility placement.  Constipation: Continue lactulose. Supratentorial malnutrition: Sign continue Ensure.   DVT prophylaxis: lovenox Family Communication:none Status is: Inpatient  Remains inpatient appropriate because:Patient is medically stable for skilled nursing facility transfer.   Dispo: The patient is from: Home              Anticipated d/c is to: SNF               Patient currently is medically stable to d/c.   Difficult to place patient Yes        Code Status:     Code Status Orders  (From admission, onward)         Start     Ordered   12/27/20 2006  Full code  Continuous        12/27/20 2008        Code Status History    This patient has a current code status but no historical code status.   Advance Care Planning Activity        IV Access:    Peripheral IV   Procedures and diagnostic studies:   No results found.   Medical Consultants:    None.   Subjective:    Alvin Clark has no new complaints.  Objective:    Vitals:   02/14/21 1958 02/15/21 0548 02/15/21 1402 02/15/21 2151  BP: 125/87 115/88 121/73 (!) 122/93  Pulse: 99 78 77 90  Resp: 19 19 16 16   Temp: 98.6 F (37 C) 98.1 F (36.7 C) 98.1 F (36.7 C) 97.9 F (36.6 C)  TempSrc: Oral Oral  Oral  SpO2: 94% 97%  95%  Weight:  70.5 kg    Height:       SpO2: 95 %   Intake/Output Summary (Last 24 hours) at 02/16/2021 0820 Last data filed at 02/15/2021 1918 Gross per 24 hour  Intake 1534 ml  Output --  Net 1534 ml   Filed Weights   02/13/21 0620 02/14/21 0510 02/15/21 0548  Weight: 71.6 kg 73.3 kg 70.5 kg    Exam: General exam: In no acute distress. Respiratory system: Good air movement and clear to auscultation. Cardiovascular system:  S1 & S2 heard, RRR. No JVD. Gastrointestinal system: Abdomen is nondistended, soft and nontender.  Extremities: No pedal edema. Skin: No rashes, lesions or ulcers Psychiatry: Judgement and insight appear normal. Mood & affect appropriate.    Data Reviewed:    Labs: Basic Metabolic Panel: Recent Labs  Lab 02/12/21 0549  MG 2.0  PHOS 4.9*   GFR Estimated Creatinine Clearance: 81.8 mL/min (by C-G formula based on SCr of 0.97 mg/dL). Liver Function Tests: No results for input(s): AST, ALT, ALKPHOS, BILITOT, PROT, ALBUMIN in the last 168 hours. No results for input(s): LIPASE, AMYLASE in the  last 168 hours. No results for input(s): AMMONIA in the last 168 hours. Coagulation profile No results for input(s): INR, PROTIME in the last 168 hours. COVID-19 Labs  No results for input(s): DDIMER, FERRITIN, LDH, CRP in the last 72 hours.  Lab Results  Component Value Date   SARSCOV2NAA NEGATIVE 01/11/2021   SARSCOV2NAA NEGATIVE 12/28/2020    CBC: No results for input(s): WBC, NEUTROABS, HGB, HCT, MCV, PLT in the last 168 hours. Cardiac Enzymes: No results for input(s): CKTOTAL, CKMB, CKMBINDEX, TROPONINI in the last 168 hours. BNP (last 3 results) No results for input(s): PROBNP in the last 8760 hours. CBG: No results for input(s): GLUCAP in the last 168 hours. D-Dimer: No results for input(s): DDIMER in the last 72 hours. Hgb A1c: No results for input(s): HGBA1C in the last 72 hours. Lipid Profile: No results for input(s): CHOL, HDL, LDLCALC, TRIG, CHOLHDL, LDLDIRECT in the last 72 hours. Thyroid function studies: No results for input(s): TSH, T4TOTAL, T3FREE, THYROIDAB in the last 72 hours.  Invalid input(s): FREET3 Anemia work up: No results for input(s): VITAMINB12, FOLATE, FERRITIN, TIBC, IRON, RETICCTPCT in the last 72 hours. Sepsis Labs: No results for input(s): PROCALCITON, WBC, LATICACIDVEN in the last 168 hours. Microbiology No results found for this or any previous visit (from the past 240 hour(s)).   Medications:   . carvedilol  6.25 mg Oral BID WC  . enoxaparin (LOVENOX) injection  40 mg Subcutaneous Q24H  . feeding supplement  237 mL Oral TID BM  . folic acid  1 mg Oral Daily  . lactulose  20 g Oral BID  . magnesium oxide  400 mg Oral BID  . multivitamin with minerals  1 tablet Oral Daily  . QUEtiapine  25 mg Oral BID  . sodium chloride flush  3 mL Intravenous Q12H  . thiamine  100 mg Oral Daily  . vitamin B-12  1,000 mcg Oral Daily   Continuous Infusions:    LOS: 50 days   Marinda Elk  [facility]  02/16/2021, 8:20 AM

## 2021-02-17 NOTE — Progress Notes (Signed)
PROGRESS NOTE    Alvin Clark  OQH:[id number] DOB: 05/31/1961 DOA: 12/27/2020 PCP: Patient, No Pcp Per (Inactive)    Brief Narrative: This 60 years old male with PMH significant for cognitive decline secondary to alcohol abuse was brought in by EMS with acute encephalopathy (Altered mental status).  Work-up has been unrevealing.  Patient has very high risks of falls and dementia.  Patient is difficult to place,  awaiting skilled nursing facility placement.  Assessment & Plan:   Principal Problem:   AKI (acute kidney injury) (HCC) Active Problems:   Confusion   Leukocytosis   Blurry vision, bilateral   Transaminitis   Constipation   Withdrawal symptoms, alcohol (HCC)   Protein-calorie malnutrition, severe   Acute metabolic encephalopathy   Hypokalemia   Hypomagnesemia   Hypophosphatemia   Fall   Debility   Low vitamin B12 level   Chronic alcohol use  Acute metabolic encephalopathy with underlying cognitive impairment: Patient presented with altered mentation.  Probably secondary to alcohol abuse.   CT scan head is negative.  Urine drug screen : Unremarkable. TSH unremarkable, B12 is low normal. Patient has clinically improved since admission. Patient is alert, awake and oriented to self,  place and time. Continue Seroquel and lactulose.  Acute kidney injury : > resolved Likely prerenal azotemia which is  Now resolved with IV hydration.  Elevated liver enzymes. Secondary to alcohol abuse.  Work-up been negative.  Chronic alcohol use Continue thiamine, folate and multivitamins.  Constipation: Continue lactulose.  Macrocytic anemia : Continue vitamin B12.  Generalized weakness, frequent falls,  impaired mobility PT/ OT recommended skilled nursing facility placement.  DVT prophylaxis: Lovenox Code Status:Full code. Family Communication: No family Disposition Plan:  Status is: Inpatient  Remains inpatient appropriate because:Inpatient level of care appropriate  due to severity of illness   Dispo: The patient is from: Home              Anticipated d/c is to: SNF              Patient currently is medically stable for DC.   Difficult to place patient Yes   Consultants:    None  Procedures: Antimicrobials:   None.  Subjective: Patient was seen and examined at bedside.  He reports feeling better,  denies any complaints.   He is awaiting placement in a nursing home.  Objective: Vitals:   02/15/21 2151 02/16/21 1421 02/16/21 2020 02/17/21 0521  BP: (!) 122/93 118/87 122/80 114/77  Pulse: 90 94 92 78  Resp: 16 20 16 16   Temp: 97.9 F (36.6 C) 98.3 F (36.8 C) 98.8 F (37.1 C) 97.7 F (36.5 C)  TempSrc: Oral Oral Oral Oral  SpO2: 95% 97% 96% 97%  Weight:      Height:        Intake/Output Summary (Last 24 hours) at 02/17/2021 1157 Last data filed at 02/17/2021 0900 Gross per 24 hour  Intake 1566 ml  Output --  Net 1566 ml   Filed Weights   02/13/21 0620 02/14/21 0510 02/15/21 0548  Weight: 71.6 kg 73.3 kg 70.5 kg    Examination:  General exam: Appears calm and comfortable, not in any acute distress Respiratory system: Clear to auscultation. Respiratory effort normal. Cardiovascular system: S1 & S2 heard, RRR. No JVD, murmurs, rubs, gallops or clicks. No pedal edema. Gastrointestinal system: Abdomen is nondistended, soft and nontender. No organomegaly or masses felt. Normal bowel sounds heard. Central nervous system: Alert and oriented. No focal neurological deficits. Extremities:  Symmetric 5 x 5 power.  No edema, no cyanosis, no clubbing. Skin: No rashes, lesions or ulcers Psychiatry:  Mood & affect appropriate.     Data Reviewed: I have personally reviewed following labs and imaging studies  CBC: No results for input(s): WBC, NEUTROABS, HGB, HCT, MCV, PLT in the last 168 hours. Basic Metabolic Panel: Recent Labs  Lab 02/12/21 0549  MG 2.0  PHOS 4.9*   GFR: Estimated Creatinine Clearance: 81.8 mL/min (by C-G  formula based on SCr of 0.97 mg/dL). Liver Function Tests: No results for input(s): AST, ALT, ALKPHOS, BILITOT, PROT, ALBUMIN in the last 168 hours. No results for input(s): LIPASE, AMYLASE in the last 168 hours. No results for input(s): AMMONIA in the last 168 hours. Coagulation Profile: No results for input(s): INR, PROTIME in the last 168 hours. Cardiac Enzymes: No results for input(s): CKTOTAL, CKMB, CKMBINDEX, TROPONINI in the last 168 hours. BNP (last 3 results) No results for input(s): PROBNP in the last 8760 hours. HbA1C: No results for input(s): HGBA1C in the last 72 hours. CBG: No results for input(s): GLUCAP in the last 168 hours. Lipid Profile: No results for input(s): CHOL, HDL, LDLCALC, TRIG, CHOLHDL, LDLDIRECT in the last 72 hours. Thyroid Function Tests: No results for input(s): TSH, T4TOTAL, FREET4, T3FREE, THYROIDAB in the last 72 hours. Anemia Panel: No results for input(s): VITAMINB12, FOLATE, FERRITIN, TIBC, IRON, RETICCTPCT in the last 72 hours. Sepsis Labs: No results for input(s): PROCALCITON, LATICACIDVEN in the last 168 hours.  No results found for this or any previous visit (from the past 240 hour(s)).   Radiology Studies: No results found.  Scheduled Meds: . carvedilol  6.25 mg Oral BID WC  . enoxaparin (LOVENOX) injection  40 mg Subcutaneous Q24H  . feeding supplement  237 mL Oral TID BM  . folic acid  1 mg Oral Daily  . lactulose  20 g Oral BID  . magnesium oxide  400 mg Oral BID  . multivitamin with minerals  1 tablet Oral Daily  . QUEtiapine  25 mg Oral BID  . sodium chloride flush  3 mL Intravenous Q12H  . thiamine  100 mg Oral Daily  . vitamin B-12  1,000 mcg Oral Daily   Continuous Infusions:   LOS: 51 days    Time spent: 25 mins    Cipriano Bunker, MD [facility]   If 7PM-7AM, please contact night-coverage

## 2021-02-18 LAB — BASIC METABOLIC PANEL
Anion gap: 10 (ref 5–15)
BUN: 18 mg/dL (ref 6–20)
CO2: 25 mmol/L (ref 22–32)
Calcium: 10 mg/dL (ref 8.9–10.3)
Chloride: 102 mmol/L (ref 98–111)
Creatinine, Ser: 0.91 mg/dL (ref 0.61–1.24)
GFR, Estimated: >60 mL/min (ref >60)
Glucose, Bld: 95 mg/dL (ref 70–99)
Potassium: 4.2 mmol/L (ref 3.5–5.1)
Sodium: 137 mmol/L (ref 135–145)

## 2021-02-18 LAB — PHOSPHORUS: Phosphorus: 4.9 mg/dL — ABNORMAL HIGH (ref 2.5–4.6)

## 2021-02-18 LAB — CBC
HCT: 42.8 % (ref 39.0–52.0)
Hemoglobin: 14 g/dL (ref 13.0–17.0)
MCH: 32.4 pg (ref 26.0–34.0)
MCHC: 32.7 g/dL (ref 30.0–36.0)
MCV: 99.1 fL (ref 80.0–100.0)
Platelets: 214 10*3/uL (ref 150–400)
RBC: 4.32 MIL/uL (ref 4.22–5.81)
RDW: 12.9 % (ref 11.5–15.5)
WBC: 9.7 10*3/uL (ref 4.0–10.5)
nRBC: 0 % (ref 0.0–0.2)

## 2021-02-18 LAB — MAGNESIUM: Magnesium: 1.9 mg/dL (ref 1.7–2.4)

## 2021-02-18 NOTE — Progress Notes (Signed)
PROGRESS NOTE    Alvin Clark  [id number]:[id number] DOB: 1961-08-04 DOA: 12/27/2020 PCP: Patient, No Pcp Per (Inactive)    Brief Narrative: This 60 years old male with PMH significant for progressive cognitive decline secondary to alcohol abuse was brought in by EMS with acute encephalopathy (Altered mental status).  Work-up has been unrevealing.  Patient has very high risks of falls and dementia.  Patient is difficult to place,  awaiting skilled nursing facility placement.  Assessment & Plan:   Principal Problem:   AKI (acute kidney injury) (HCC) Active Problems:   Confusion   Leukocytosis   Blurry vision, bilateral   Transaminitis   Constipation   Withdrawal symptoms, alcohol (HCC)   Protein-calorie malnutrition, severe   Acute metabolic encephalopathy   Hypokalemia   Hypomagnesemia   Hypophosphatemia   Fall   Debility   Low vitamin B12 level   Chronic alcohol use  Acute metabolic encephalopathy with underlying cognitive impairment: Patient presented with altered mentation.  Probably secondary to alcohol abuse.   CT scan head is negative.  Urine drug screen : Unremarkable. TSH unremarkable, B12 is low normal. Patient has clinically improved since admission. Patient is alert, awake and oriented to self,  place and time. Continue Seroquel and lactulose.  Acute kidney injury : > resolved Likely prerenal azotemia which is  Now resolved with IV hydration.  Elevated liver enzymes. Secondary to alcohol abuse.  Work-up been negative.  Chronic alcohol use Continue thiamine, folate and multivitamins.  Constipation: Continue lactulose.  Macrocytic anemia : Continue vitamin B12.  Generalized weakness, frequent falls,  impaired mobility PT/ OT recommended skilled nursing facility placement.  DVT prophylaxis: Lovenox Code Status:Full code. Family Communication: No family Disposition Plan:  Status is: Inpatient  Remains inpatient appropriate because:Inpatient level of care  appropriate due to severity of illness   Dispo: The patient is from: Home              Anticipated d/c is to: SNF              Patient currently is medically stable for DC.   Difficult to place patient Yes   Consultants:    None  Procedures: Antimicrobials:   None.  Subjective: Patient was seen and examined at bedside. Overnight events noted.  He reports feeling better,  denies any complaints.   He is awaiting placement in a nursing home.  Objective: Vitals:   02/17/21 1314 02/17/21 2013 02/18/21 0407 02/18/21 0416  BP: 124/80 131/74 108/72   Pulse: 89 94 81   Resp: 18 16 16    Temp: 98.3 F (36.8 C) 98.2 F (36.8 C) 98.5 F (36.9 C)   TempSrc:  Oral Oral   SpO2: 98% 98% 99%   Weight:    73.5 kg  Height:        Intake/Output Summary (Last 24 hours) at 02/18/2021 1122 Last data filed at 02/18/2021 0700 Gross per 24 hour  Intake 720 ml  Output --  Net 720 ml   Filed Weights   02/14/21 0510 02/15/21 0548 02/18/21 0416  Weight: 73.3 kg 70.5 kg 73.5 kg    Examination:  General exam: Appears calm and comfortable, not in any acute distress. Respiratory system: Clear to auscultation. Respiratory effort normal. Cardiovascular system: S1 & S2 heard, RRR. No JVD, murmurs, rubs, gallops or clicks. No pedal edema. Gastrointestinal system: Abdomen is nondistended, soft and nontender. No organomegaly or masses felt.  Normal bowel sounds heard. Central nervous system: Alert and oriented. No focal neurological  deficits. Extremities: Symmetric 5 x 5 power.  No edema, no cyanosis, no clubbing. Skin: No rashes, lesions or ulcers Psychiatry:  Mood & affect appropriate.     Data Reviewed: I have personally reviewed following labs and imaging studies  CBC: Recent Labs  Lab 02/18/21 0559  WBC 9.7  HGB 14.0  HCT 42.8  MCV 99.1  PLT 214   Basic Metabolic Panel: Recent Labs  Lab 02/12/21 0549 02/18/21 0559  NA  --  137  K  --  4.2  CL  --  102  CO2  --  25  GLUCOSE   --  95  BUN  --  18  CREATININE  --  0.91  CALCIUM  --  10.0  MG 2.0 1.9  PHOS 4.9* 4.9*   GFR: Estimated Creatinine Clearance: 87.4 mL/min (by C-G formula based on SCr of 0.91 mg/dL). Liver Function Tests: No results for input(s): AST, ALT, ALKPHOS, BILITOT, PROT, ALBUMIN in the last 168 hours. No results for input(s): LIPASE, AMYLASE in the last 168 hours. No results for input(s): AMMONIA in the last 168 hours. Coagulation Profile: No results for input(s): INR, PROTIME in the last 168 hours. Cardiac Enzymes: No results for input(s): CKTOTAL, CKMB, CKMBINDEX, TROPONINI in the last 168 hours. BNP (last 3 results) No results for input(s): PROBNP in the last 8760 hours. HbA1C: No results for input(s): HGBA1C in the last 72 hours. CBG: No results for input(s): GLUCAP in the last 168 hours. Lipid Profile: No results for input(s): CHOL, HDL, LDLCALC, TRIG, CHOLHDL, LDLDIRECT in the last 72 hours. Thyroid Function Tests: No results for input(s): TSH, T4TOTAL, FREET4, T3FREE, THYROIDAB in the last 72 hours. Anemia Panel: No results for input(s): VITAMINB12, FOLATE, FERRITIN, TIBC, IRON, RETICCTPCT in the last 72 hours. Sepsis Labs: No results for input(s): PROCALCITON, LATICACIDVEN in the last 168 hours.  No results found for this or any previous visit (from the past 240 hour(s)).   Radiology Studies: No results found.  Scheduled Meds: . carvedilol  6.25 mg Oral BID WC  . enoxaparin (LOVENOX) injection  40 mg Subcutaneous Q24H  . feeding supplement  237 mL Oral TID BM  . folic acid  1 mg Oral Daily  . lactulose  20 g Oral BID  . magnesium oxide  400 mg Oral BID  . multivitamin with minerals  1 tablet Oral Daily  . QUEtiapine  25 mg Oral BID  . sodium chloride flush  3 mL Intravenous Q12H  . thiamine  100 mg Oral Daily  . vitamin B-12  1,000 mcg Oral Daily   Continuous Infusions:   LOS: 52 days    Time spent: 25 mins    Cipriano Bunker, MD [facility]   If  7PM-7AM, please contact night-coverage

## 2021-02-19 NOTE — Progress Notes (Signed)
PROGRESS NOTE    Alvin Clark  ZHY:[id number] DOB: June 08, 1961 DOA: 12/27/2020 PCP: Alvin Clark, No Pcp Per (Inactive)    Brief Narrative: This 60 years old male with PMH significant for progressive cognitive decline secondary to alcohol abuse was brought in by EMS with acute encephalopathy (Altered mental status).  Work-up has been unrevealing.  Alvin Clark has very high risks of falls and dementia.  Alvin Clark is difficult to place,  awaiting skilled nursing facility placement.  Assessment & Plan:   Principal Problem:   AKI (acute kidney injury) (HCC) Active Problems:   Confusion   Leukocytosis   Blurry vision, bilateral   Transaminitis   Constipation   Withdrawal symptoms, alcohol (HCC)   Protein-calorie malnutrition, severe   Acute metabolic encephalopathy   Hypokalemia   Hypomagnesemia   Hypophosphatemia   Fall   Debility   Low vitamin B12 level   Chronic alcohol use  Acute metabolic encephalopathy with underlying cognitive impairment: Alvin Clark presented with altered mentation.  Probably secondary to alcohol abuse.   CT scan head is negative.  Urine drug screen : Unremarkable. TSH unremarkable, B12 is low normal. Alvin Clark has clinically improved since admission. Alvin Clark is alert, awake and oriented to self,  place and time. Continue Seroquel and lactulose.  Acute kidney injury : > resolved Likely prerenal azotemia which is  Now resolved with IV hydration.  Elevated liver enzymes. Secondary to alcohol abuse.  Work-up been negative.  Chronic alcohol use Continue thiamine, folate and multivitamins.  Constipation: Continue lactulose.  Macrocytic anemia : Continue vitamin B12.  Generalized weakness, frequent falls,  impaired mobility PT/ OT recommended skilled nursing facility placement.  DVT prophylaxis: Lovenox Code Status:Full code. Family Communication: No family Disposition Plan:  Status is: Inpatient  Remains inpatient appropriate because:Inpatient level of care  appropriate due to severity of illness   Dispo: The Alvin Clark is from: Home              Anticipated d/c is to: SNF              Alvin Clark currently is medically stable for DC.   Difficult to place Alvin Clark Yes   Consultants:    None  Procedures: Antimicrobials:   None.  Subjective: Alvin Clark was seen and examined at bedside. Overnight events noted.  Alvin Clark denies any complaints.  He slept well . He is awaiting placement in a nursing home.  Objective: Vitals:   02/18/21 1328 02/18/21 2009 02/19/21 0432 02/19/21 0500  BP: 135/89 113/73 115/90   Pulse: 97 84 92   Resp: 18 20 16    Temp: 98.6 F (37 C) 98.7 F (37.1 C) 98.1 F (36.7 C)   TempSrc:  Oral Oral   SpO2: 93% 96% 98%   Weight:    68 kg  Height:        Intake/Output Summary (Last 24 hours) at 02/19/2021 1129 Last data filed at 02/19/2021 0836 Gross per 24 hour  Intake 720 ml  Output 0 ml  Net 720 ml   Filed Weights   02/15/21 0548 02/18/21 0416 02/19/21 0500  Weight: 70.5 kg 73.5 kg 68 kg    Examination:  General exam: Appears calm and comfortable, not in any acute distress. Respiratory system: Clear to auscultation. Respiratory effort normal. Cardiovascular system: S1 & S2 heard, RRR. No JVD, murmurs, rubs, gallops or clicks. No pedal edema. Gastrointestinal system: Abdomen is nondistended, soft and nontender. No organomegaly or masses felt.  Normal bowel sounds heard. Central nervous system: Alert and oriented x 2. No  focal neurological deficits. Extremities: Symmetric 5 x 5 power.  No edema, no cyanosis, no clubbing. Skin: No rashes, lesions or ulcers Psychiatry:  Mood & affect appropriate.     Data Reviewed: I have personally reviewed following labs and imaging studies  CBC: Recent Labs  Lab 02/18/21 0559  WBC 9.7  HGB 14.0  HCT 42.8  MCV 99.1  PLT 214   Basic Metabolic Panel: Recent Labs  Lab 02/18/21 0559  NA 137  K 4.2  CL 102  CO2 25  GLUCOSE 95  BUN 18  CREATININE 0.91  CALCIUM  10.0  MG 1.9  PHOS 4.9*   GFR: Estimated Creatinine Clearance: 84.1 mL/min (by C-G formula based on SCr of 0.91 mg/dL). Liver Function Tests: No results for input(s): AST, ALT, ALKPHOS, BILITOT, PROT, ALBUMIN in the last 168 hours. No results for input(s): LIPASE, AMYLASE in the last 168 hours. No results for input(s): AMMONIA in the last 168 hours. Coagulation Profile: No results for input(s): INR, PROTIME in the last 168 hours. Cardiac Enzymes: No results for input(s): CKTOTAL, CKMB, CKMBINDEX, TROPONINI in the last 168 hours. BNP (last 3 results) No results for input(s): PROBNP in the last 8760 hours. HbA1C: No results for input(s): HGBA1C in the last 72 hours. CBG: No results for input(s): GLUCAP in the last 168 hours. Lipid Profile: No results for input(s): CHOL, HDL, LDLCALC, TRIG, CHOLHDL, LDLDIRECT in the last 72 hours. Thyroid Function Tests: No results for input(s): TSH, T4TOTAL, FREET4, T3FREE, THYROIDAB in the last 72 hours. Anemia Panel: No results for input(s): VITAMINB12, FOLATE, FERRITIN, TIBC, IRON, RETICCTPCT in the last 72 hours. Sepsis Labs: No results for input(s): PROCALCITON, LATICACIDVEN in the last 168 hours.  No results found for this or any previous visit (from the past 240 hour(s)).   Radiology Studies: No results found.  Scheduled Meds: . carvedilol  6.25 mg Oral BID WC  . enoxaparin (LOVENOX) injection  40 mg Subcutaneous Q24H  . feeding supplement  237 mL Oral TID BM  . folic acid  1 mg Oral Daily  . lactulose  20 g Oral BID  . magnesium oxide  400 mg Oral BID  . multivitamin with minerals  1 tablet Oral Daily  . QUEtiapine  25 mg Oral BID  . sodium chloride flush  3 mL Intravenous Q12H  . thiamine  100 mg Oral Daily  . vitamin B-12  1,000 mcg Oral Daily   Continuous Infusions:   LOS: 53 days    Time spent: 25 mins    Cipriano Bunker, MD [facility]   If 7PM-7AM, please contact night-coverage

## 2021-02-19 NOTE — Plan of Care (Signed)

## 2021-02-20 ENCOUNTER — Encounter (HOSPITAL_COMMUNITY): Payer: Self-pay | Admitting: Internal Medicine

## 2021-02-20 NOTE — Progress Notes (Signed)
Patient offered shower/bath x3 attempts this day by Nurse Tech. Patient refused shower/bath. Will continue to monitor.

## 2021-02-20 NOTE — TOC Progression Note (Signed)
Transition of Care [facility]) - Progression Note    Patient Details  Name: Alvin Clark MRN: [id number] Date of Birth: 1961/08/28  Transition of Care [facility]) CM/SW Contact  Darleene Cleaver, [place] Phone Number: 02/20/2021, 4:47 PM  Clinical Narrative:     CSW spoke to patient's sister Alvin Clark, and asked if she was able to fill out release form and email it back to CSW.  Alvin Clark emailed back and said she sent it earlier in the day.  CSW reviewed emails it was not received.  CSW requested that it be resent.  Patient's sister did resend it and it was received by this CSW.  CSW to forward release form to Alvin Clark at [employer], CSW to follow up tomorrow.   Expected Discharge Plan: Assisted Living (versus family care home) Barriers to Discharge: Financial Resources,Homeless with medical needs,Continued Medical Work up,No SNF bed,Requiring sitter/restraints,SNF Pending payor source - LOG,SNF Pending Medicaid,Family Issues  Expected Discharge Plan and Services Expected Discharge Plan: Assisted Living (versus family care home) In-house Referral: Clinical Social Work,Financial Counselor     Living arrangements for the past 2 months: No permanent address                                       Social Determinants of Health (SDOH) Interventions    Readmission Risk Interventions No flowsheet data found.

## 2021-02-20 NOTE — Progress Notes (Signed)
PROGRESS NOTE    ZADKIEL DRAGAN  [id number]:[id number] DOB: October 06, 1961 DOA: 12/27/2020 PCP: Patient, No Pcp Per (Inactive)    Brief Narrative: This 60 years old male with PMH significant for progressive cognitive decline secondary to alcohol abuse was brought in by EMS with acute encephalopathy (Altered mental status).  Work-up has been unrevealing.  Patient has very high risks of falls and dementia.  Patient is difficult to place,  awaiting skilled nursing facility placement.  Assessment & Plan:   Principal Problem:   AKI (acute kidney injury) (HCC) Active Problems:   Confusion   Leukocytosis   Blurry vision, bilateral   Transaminitis   Constipation   Withdrawal symptoms, alcohol (HCC)   Protein-calorie malnutrition, severe   Acute metabolic encephalopathy   Hypokalemia   Hypomagnesemia   Hypophosphatemia   Fall   Debility   Low vitamin B12 level   Chronic alcohol use  Acute metabolic encephalopathy with underlying cognitive impairment: Patient presented with altered mentation.  Probably secondary to alcohol abuse.   CT scan head is negative.  Urine drug screen : Unremarkable. TSH unremarkable, B12 is low normal. Patient has clinically improved since admission. Patient is alert, awake and oriented to self,  place and time. Continue Seroquel and lactulose.  Acute kidney injury : > resolved Likely prerenal azotemia which is  Now resolved with IV hydration.  Elevated liver enzymes. Secondary to alcohol abuse.  Work-up been negative.  Chronic alcohol use Continue thiamine, folate and multivitamins.  Constipation: Continue lactulose.  Macrocytic anemia : Continue vitamin B12.  Generalized weakness, frequent falls,  impaired mobility PT/ OT recommended skilled nursing facility placement.  DVT prophylaxis: Lovenox Code Status:Full code. Family Communication: No family Disposition Plan:  Status is: Inpatient  Remains inpatient appropriate because:Inpatient level of care  appropriate due to severity of illness   Dispo: The patient is from: Home              Anticipated d/c is to: SNF               Patient currently is medically stable for DC.   Difficult to place patient Yes    Consultants:    None  Procedures: Antimicrobials:   None.  Subjective: Patient was seen and examined at bedside. Overnight events noted.  He denies any concerns / complaints.  He is awaiting placement in a nursing home.  Objective: Vitals:   02/19/21 0500 02/19/21 1324 02/19/21 1936 02/20/21 0448  BP:  119/73 139/90 113/67  Pulse:  94 86 79  Resp:  17 17 16   Temp:  98.3 F (36.8 C) 97.6 F (36.4 C) 97.8 F (36.6 C)  TempSrc:      SpO2:  97% 97% 95%  Weight: 68 kg     Height:        Intake/Output Summary (Last 24 hours) at 02/20/2021 1210 Last data filed at 02/20/2021 0816 Gross per 24 hour  Intake 836 ml  Output --  Net 836 ml   Filed Weights   02/15/21 0548 02/18/21 0416 02/19/21 0500  Weight: 70.5 kg 73.5 kg 68 kg    Examination:  General exam: Appears calm and comfortable, not in any acute distress. Respiratory system: Clear to auscultation. Respiratory effort normal. Cardiovascular system: S1 & S2 heard, RRR. No JVD, murmurs, rubs, gallops or clicks. No pedal edema. Gastrointestinal system: Abdomen is nondistended, soft and nontender. No organomegaly or masses felt.  Normal bowel sounds heard. Central nervous system: Alert and oriented x 2. No focal  neurological deficits. Extremities: Symmetric 5 x 5 power.  No edema, no cyanosis, no clubbing. Skin: No rashes, lesions or ulcers Psychiatry:  Mood & affect appropriate.     Data Reviewed: I have personally reviewed following labs and imaging studies  CBC: Recent Labs  Lab 02/18/21 0559  WBC 9.7  HGB 14.0  HCT 42.8  MCV 99.1  PLT 214   Basic Metabolic Panel: Recent Labs  Lab 02/18/21 0559  NA 137  K 4.2  CL 102  CO2 25  GLUCOSE 95  BUN 18  CREATININE 0.91  CALCIUM 10.0  MG 1.9   PHOS 4.9*   GFR: Estimated Creatinine Clearance: 84.1 mL/min (by C-G formula based on SCr of 0.91 mg/dL). Liver Function Tests: No results for input(s): AST, ALT, ALKPHOS, BILITOT, PROT, ALBUMIN in the last 168 hours. No results for input(s): LIPASE, AMYLASE in the last 168 hours. No results for input(s): AMMONIA in the last 168 hours. Coagulation Profile: No results for input(s): INR, PROTIME in the last 168 hours. Cardiac Enzymes: No results for input(s): CKTOTAL, CKMB, CKMBINDEX, TROPONINI in the last 168 hours. BNP (last 3 results) No results for input(s): PROBNP in the last 8760 hours. HbA1C: No results for input(s): HGBA1C in the last 72 hours. CBG: No results for input(s): GLUCAP in the last 168 hours. Lipid Profile: No results for input(s): CHOL, HDL, LDLCALC, TRIG, CHOLHDL, LDLDIRECT in the last 72 hours. Thyroid Function Tests: No results for input(s): TSH, T4TOTAL, FREET4, T3FREE, THYROIDAB in the last 72 hours. Anemia Panel: No results for input(s): VITAMINB12, FOLATE, FERRITIN, TIBC, IRON, RETICCTPCT in the last 72 hours. Sepsis Labs: No results for input(s): PROCALCITON, LATICACIDVEN in the last 168 hours.  No results found for this or any previous visit (from the past 240 hour(s)).   Radiology Studies: No results found.  Scheduled Meds: . carvedilol  6.25 mg Oral BID WC  . enoxaparin (LOVENOX) injection  40 mg Subcutaneous Q24H  . feeding supplement  237 mL Oral TID BM  . folic acid  1 mg Oral Daily  . lactulose  20 g Oral BID  . magnesium oxide  400 mg Oral BID  . multivitamin with minerals  1 tablet Oral Daily  . QUEtiapine  25 mg Oral BID  . sodium chloride flush  3 mL Intravenous Q12H  . thiamine  100 mg Oral Daily  . vitamin B-12  1,000 mcg Oral Daily   Continuous Infusions:   LOS: 54 days    Time spent: 25 mins    Cipriano Bunker, MD [facility]   If 7PM-7AM, please contact night-coverage

## 2021-02-20 NOTE — Progress Notes (Signed)
Patient offered a walk on unit by Nurse and Nurse tech x3 attempts today, patient has refused. Will offer ambulation exercise to patient later today.

## 2021-02-21 NOTE — Progress Notes (Signed)
PROGRESS NOTE    Alvin Clark  TKW:[id number] DOB: Jun 03, 1961 DOA: 12/27/2020 PCP: Patient, No Pcp Per (Inactive)    Brief Narrative: This 60 years old male with PMH significant for progressive cognitive decline secondary to alcohol abuse was brought in by EMS with acute encephalopathy (Altered mental status).  Work-up has been unrevealing.  Patient has very high risks of falls and dementia.  Patient is difficult to place,  awaiting skilled nursing facility placement.  Assessment & Plan:   Principal Problem:   AKI (acute kidney injury) (HCC) Active Problems:   Confusion   Leukocytosis   Blurry vision, bilateral   Transaminitis   Constipation   Withdrawal symptoms, alcohol (HCC)   Protein-calorie malnutrition, severe   Acute metabolic encephalopathy   Hypokalemia   Hypomagnesemia   Hypophosphatemia   Fall   Debility   Low vitamin B12 level   Chronic alcohol use  Acute metabolic encephalopathy with underlying cognitive impairment: Patient presented with altered mentation.  Probably secondary to alcohol abuse.   CT scan head is negative.  Urine drug screen : Unremarkable. TSH unremarkable, B12 is low normal. Patient has clinically improved since admission. Patient is alert, awake and oriented to self,  place and time. Continue Seroquel and lactulose.  Acute kidney injury : > resolved Likely prerenal azotemia which is  Now resolved with IV hydration.  Elevated liver enzymes. Secondary to alcohol abuse.  Work-up been negative.  Chronic alcohol use Continue thiamine, folate and multivitamins.  Constipation: Continue lactulose.  Macrocytic anemia : Continue vitamin B12.  Generalized weakness, frequent falls,  impaired mobility PT/ OT recommended skilled nursing facility placement.  DVT prophylaxis: Lovenox Code Status:Full code. Family Communication: No family Disposition Plan:  Status is: Inpatient  Remains inpatient appropriate because:Inpatient level of care  appropriate due to severity of illness   Dispo: The patient is from: Home              Anticipated d/c is to: SNF               Patient currently is medically stable for DC.   Difficult to place patient Yes    Consultants:    None  Procedures: Antimicrobials:   None.  Subjective: Patient was seen and examined at bedside. Overnight events noted.  Denies any questions and concerns. He is awaiting placement in a nursing home.  Objective: Vitals:   02/20/21 1957 02/21/21 0410 02/21/21 0500 02/21/21 1313  BP: 125/68 119/75  125/75  Pulse: 83 77  86  Resp: 18 18  17   Temp: 98.5 F (36.9 C) 97.6 F (36.4 C)  98 F (36.7 C)  TempSrc:  Oral  Oral  SpO2: 97% 98%  97%  Weight:   69.9 kg   Height:        Intake/Output Summary (Last 24 hours) at 02/21/2021 1331 Last data filed at 02/21/2021 1150 Gross per 24 hour  Intake 472 ml  Output --  Net 472 ml   Filed Weights   02/18/21 0416 02/19/21 0500 02/21/21 0500  Weight: 73.5 kg 68 kg 69.9 kg    Examination:  General exam: Appears calm and comfortable, not in any acute distress. Respiratory system: Clear to auscultation. Respiratory effort normal. Cardiovascular system: S1 & S2 heard, RRR. No JVD, murmurs, rubs, gallops or clicks. No pedal edema. Gastrointestinal system: Abdomen is nondistended, soft and nontender. No organomegaly or masses felt.  Normal bowel sounds heard. Central nervous system: Alert and oriented x 3. No focal neurological deficits.  Extremities: Symmetric 5 x 5 power.  No edema, no cyanosis, no clubbing. Skin: No rashes, lesions or ulcers Psychiatry:  Mood & affect appropriate.  Denies any suicidal homicidal ideations.    Data Reviewed: I have personally reviewed following labs and imaging studies  CBC: Recent Labs  Lab 02/18/21 0559  WBC 9.7  HGB 14.0  HCT 42.8  MCV 99.1  PLT 214   Basic Metabolic Panel: Recent Labs  Lab 02/18/21 0559  NA 137  K 4.2  CL 102  CO2 25  GLUCOSE 95  BUN  18  CREATININE 0.91  CALCIUM 10.0  MG 1.9  PHOS 4.9*   GFR: Estimated Creatinine Clearance: 86.4 mL/min (by C-G formula based on SCr of 0.91 mg/dL). Liver Function Tests: No results for input(s): AST, ALT, ALKPHOS, BILITOT, PROT, ALBUMIN in the last 168 hours. No results for input(s): LIPASE, AMYLASE in the last 168 hours. No results for input(s): AMMONIA in the last 168 hours. Coagulation Profile: No results for input(s): INR, PROTIME in the last 168 hours. Cardiac Enzymes: No results for input(s): CKTOTAL, CKMB, CKMBINDEX, TROPONINI in the last 168 hours. BNP (last 3 results) No results for input(s): PROBNP in the last 8760 hours. HbA1C: No results for input(s): HGBA1C in the last 72 hours. CBG: No results for input(s): GLUCAP in the last 168 hours. Lipid Profile: No results for input(s): CHOL, HDL, LDLCALC, TRIG, CHOLHDL, LDLDIRECT in the last 72 hours. Thyroid Function Tests: No results for input(s): TSH, T4TOTAL, FREET4, T3FREE, THYROIDAB in the last 72 hours. Anemia Panel: No results for input(s): VITAMINB12, FOLATE, FERRITIN, TIBC, IRON, RETICCTPCT in the last 72 hours. Sepsis Labs: No results for input(s): PROCALCITON, LATICACIDVEN in the last 168 hours.  No results found for this or any previous visit (from the past 240 hour(s)).   Radiology Studies: No results found.  Scheduled Meds: . carvedilol  6.25 mg Oral BID WC  . enoxaparin (LOVENOX) injection  40 mg Subcutaneous Q24H  . feeding supplement  237 mL Oral TID BM  . folic acid  1 mg Oral Daily  . lactulose  20 g Oral BID  . magnesium oxide  400 mg Oral BID  . multivitamin with minerals  1 tablet Oral Daily  . QUEtiapine  25 mg Oral BID  . sodium chloride flush  3 mL Intravenous Q12H  . thiamine  100 mg Oral Daily  . vitamin B-12  1,000 mcg Oral Daily   Continuous Infusions:   LOS: 55 days    Time spent: 25 mins    Cipriano Bunker, MD [facility]   If 7PM-7AM, please contact  night-coverage

## 2021-02-22 NOTE — Progress Notes (Signed)
PROGRESS NOTE    Alvin Clark  ELF:[id number] DOB: 07/30/1961 DOA: 12/27/2020 PCP: Patient, No Pcp Per (Inactive)    Brief Narrative: This 60 years old male with PMH significant for progressive cognitive decline secondary to alcohol abuse was brought in by EMS with acute encephalopathy (Altered mental status).  Work-up has been unrevealing.  Patient has very high risks of falls and dementia.  Patient is difficult to place,  awaiting skilled nursing facility placement.  Assessment & Plan:   Principal Problem:   AKI (acute kidney injury) (HCC) Active Problems:   Confusion   Leukocytosis   Blurry vision, bilateral   Transaminitis   Constipation   Withdrawal symptoms, alcohol (HCC)   Protein-calorie malnutrition, severe   Acute metabolic encephalopathy   Hypokalemia   Hypomagnesemia   Hypophosphatemia   Fall   Debility   Low vitamin B12 level   Chronic alcohol use  Acute metabolic encephalopathy with underlying cognitive impairment: Patient presented with altered mentation.  Probably secondary to alcohol abuse.   CT scan head is negative.  Urine drug screen : Unremarkable. TSH unremarkable, B12 is low normal. Patient has clinically improved since admission. Patient is alert, awake and oriented to self,  place and time. Continue Seroquel and lactulose.  Acute kidney injury : > resolved Likely prerenal azotemia which is  Now resolved with IV hydration.  Elevated liver enzymes. Secondary to alcohol abuse.  Work-up been negative.  Chronic alcohol use Continue thiamine, folate and multivitamins.  Constipation: Continue lactulose.  Macrocytic anemia : Continue vitamin B12.  Generalized weakness, frequent falls,  impaired mobility PT/ OT recommended skilled nursing facility placement.  DVT prophylaxis: Lovenox Code Status:Full code. Family Communication: No family Disposition Plan:  Status is: Inpatient  Remains inpatient appropriate because:Inpatient level of care  appropriate due to severity of illness   Dispo: The patient is from: Home              Anticipated d/c is to: SNF               Patient currently is medically stable for DC.   Difficult to place patient Yes   Barriers to discharge: Homeless with medical needs, no SNF beds, requiring sitter/restraints.  Family issues.,  Financial resources  Consultants:    None  Procedures: Antimicrobials:   None.  Subjective: Patient was seen and examined at bedside.  No overnight events.  Denies any questions and concerns. Patient is homeless and awaiting placement.  Objective: Vitals:   02/21/21 1313 02/21/21 2038 02/22/21 0403 02/22/21 0500  BP: 125/75 120/83 113/71   Pulse: 86 85 82   Resp: 17 18 18    Temp: 98 F (36.7 C) 98.7 F (37.1 C) 98 F (36.7 C)   TempSrc: Oral     SpO2: 97% 95% 98%   Weight:    72.3 kg  Height:        Intake/Output Summary (Last 24 hours) at 02/22/2021 1219 Last data filed at 02/22/2021 1156 Gross per 24 hour  Intake 590 ml  Output --  Net 590 ml   Filed Weights   02/19/21 0500 02/21/21 0500 02/22/21 0500  Weight: 68 kg 69.9 kg 72.3 kg    Examination:  General exam: Appears calm and comfortable, not in any acute distress. Respiratory system: Clear to auscultation. Respiratory effort normal. Cardiovascular system: S1 & S2 heard, RRR. No JVD, murmurs, rubs, gallops or clicks. No pedal edema. Gastrointestinal system: Abdomen is nondistended, soft and nontender. No organomegaly or masses felt.  Normal bowel sounds heard. Central nervous system: Alert and oriented x 3. No focal neurological deficits. Extremities:  No edema, no cyanosis, no clubbing. Skin: No rashes, lesions or ulcers Psychiatry:  Mood & affect appropriate.  Denies any suicidal homicidal ideations.    Data Reviewed: I have personally reviewed following labs and imaging studies  CBC: Recent Labs  Lab 02/18/21 0559  WBC 9.7  HGB 14.0  HCT 42.8  MCV 99.1  PLT 214   Basic  Metabolic Panel: Recent Labs  Lab 02/18/21 0559  NA 137  K 4.2  CL 102  CO2 25  GLUCOSE 95  BUN 18  CREATININE 0.91  CALCIUM 10.0  MG 1.9  PHOS 4.9*   GFR: Estimated Creatinine Clearance: 87.4 mL/min (by C-G formula based on SCr of 0.91 mg/dL). Liver Function Tests: No results for input(s): AST, ALT, ALKPHOS, BILITOT, PROT, ALBUMIN in the last 168 hours. No results for input(s): LIPASE, AMYLASE in the last 168 hours. No results for input(s): AMMONIA in the last 168 hours. Coagulation Profile: No results for input(s): INR, PROTIME in the last 168 hours. Cardiac Enzymes: No results for input(s): CKTOTAL, CKMB, CKMBINDEX, TROPONINI in the last 168 hours. BNP (last 3 results) No results for input(s): PROBNP in the last 8760 hours. HbA1C: No results for input(s): HGBA1C in the last 72 hours. CBG: No results for input(s): GLUCAP in the last 168 hours. Lipid Profile: No results for input(s): CHOL, HDL, LDLCALC, TRIG, CHOLHDL, LDLDIRECT in the last 72 hours. Thyroid Function Tests: No results for input(s): TSH, T4TOTAL, FREET4, T3FREE, THYROIDAB in the last 72 hours. Anemia Panel: No results for input(s): VITAMINB12, FOLATE, FERRITIN, TIBC, IRON, RETICCTPCT in the last 72 hours. Sepsis Labs: No results for input(s): PROCALCITON, LATICACIDVEN in the last 168 hours.  No results found for this or any previous visit (from the past 240 hour(s)).   Radiology Studies: No results found.  Scheduled Meds: . carvedilol  6.25 mg Oral BID WC  . enoxaparin (LOVENOX) injection  40 mg Subcutaneous Q24H  . feeding supplement  237 mL Oral TID BM  . folic acid  1 mg Oral Daily  . lactulose  20 g Oral BID  . magnesium oxide  400 mg Oral BID  . multivitamin with minerals  1 tablet Oral Daily  . QUEtiapine  25 mg Oral BID  . sodium chloride flush  3 mL Intravenous Q12H  . thiamine  100 mg Oral Daily  . vitamin B-12  1,000 mcg Oral Daily   Continuous Infusions:   LOS: 56 days    Time  spent: 25 mins    Cipriano Bunker, MD [facility]   If 7PM-7AM, please contact night-coverage

## 2021-02-22 NOTE — TOC Progression Note (Signed)
Transition of Care [facility]) - Progression Note    Patient Details  Name: Alvin Clark MRN: [id number] Date of Birth: 1961-08-04  Transition of Care [facility]) CM/SW Contact  Darleene Cleaver, [place] Phone Number: 02/22/2021, 11:31 AM  Clinical Narrative:     CSW contacted Andrey Farmer from [facility] to see if they can accept patient.  He said that there are not any beds available for males anymore.   Expected Discharge Plan: Assisted Living (versus family care home) Barriers to Discharge: Financial Resources,Homeless with medical needs,Continued Medical Work up,No SNF bed,Requiring sitter/restraints,SNF Pending payor source - LOG,SNF Pending Medicaid,Family Issues  Expected Discharge Plan and Services Expected Discharge Plan: Assisted Living (versus family care home) In-house Referral: Clinical Social Work,Financial Counselor     Living arrangements for the past 2 months: No permanent address                                       Social Determinants of Health (SDOH) Interventions    Readmission Risk Interventions No flowsheet data found.

## 2021-02-23 NOTE — Progress Notes (Signed)
PROGRESS NOTE    Alvin Clark  OEV:[id number] DOB: 05/24/1961 DOA: 12/27/2020 PCP: Patient, No Pcp Per (Inactive)    Brief Narrative: This 60 years old male with PMH significant for progressive cognitive decline secondary to alcohol abuse was brought in by EMS with acute encephalopathy (Altered mental status).  Work-up has been unrevealing.  Patient has very high risks of falls and dementia.  Patient is difficult to place,  awaiting skilled nursing facility placement.  Assessment & Plan:   Principal Problem:   AKI (acute kidney injury) (HCC) Active Problems:   Confusion   Leukocytosis   Blurry vision, bilateral   Transaminitis   Constipation   Withdrawal symptoms, alcohol (HCC)   Protein-calorie malnutrition, severe   Acute metabolic encephalopathy   Hypokalemia   Hypomagnesemia   Hypophosphatemia   Fall   Debility   Low vitamin B12 level   Chronic alcohol use  Acute metabolic encephalopathy with underlying cognitive impairment:  Patient presented with altered mentation.  Probably secondary to alcohol abuse.   CT scan head is negative.  Urine drug screen : Unremarkable. TSH unremarkable, B12 is low normal. Patient has clinically improved since admission. Patient is alert, awake and oriented to self,  place and time. Continue Seroquel and lactulose.  Acute kidney injury : > resolved Likely prerenal azotemia which is  Now resolved with IV hydration.  Elevated liver enzymes. Secondary to alcohol abuse.  Work-up been negative. Liver Enzymes normal.  Chronic alcohol use Continue thiamine, folate and multivitamins.  Constipation: Continue lactulose.  Macrocytic anemia : Continue vitamin B12.  Generalized weakness, frequent falls,  impaired mobility PT/ OT recommended skilled nursing facility placement.  DVT prophylaxis: Lovenox Code Status:Full code. Family Communication: No family Disposition Plan:  Status is: Inpatient  Remains inpatient appropriate  because:Inpatient level of care appropriate due to severity of illness   Dispo: The patient is from: Home              Anticipated d/c is to: SNF               Patient currently is medically stable for DC.   Difficult to place patient Yes   Barriers to discharge: Homeless with medical needs, no SNF beds, requiring sitter/restraints.  Family issues.,  Financial resources  Consultants:    None  Procedures: Antimicrobials:   None.  Subjective: Patient was seen and examined at bedside.  No overnight events.   Denies any questions and concerns. Patient is homeless and awaiting placement.  Objective: Vitals:   02/22/21 1933 02/23/21 0259 02/23/21 0500 02/23/21 1307  BP: 129/84 94/64  114/78  Pulse: 88 71  91  Resp: 18 15  17   Temp: 98.8 F (37.1 C) 98.4 F (36.9 C)  98.6 F (37 C)  TempSrc: Oral Oral    SpO2: 97%   96%  Weight:   75.8 kg   Height:        Intake/Output Summary (Last 24 hours) at 02/23/2021 1509 Last data filed at 02/23/2021 1100 Gross per 24 hour  Intake 716 ml  Output --  Net 716 ml   Filed Weights   02/21/21 0500 02/22/21 0500 02/23/21 0500  Weight: 69.9 kg 72.3 kg 75.8 kg    Examination:  General exam: Appears calm and comfortable, not in any acute distress. Respiratory system: Clear to auscultation. Respiratory effort normal. Cardiovascular system: S1 & S2 heard, RRR. No JVD, murmurs, rubs, gallops or clicks. No pedal edema. Gastrointestinal system: Abdomen is nondistended, soft and nontender.  No organomegaly or masses felt.  Normal bowel sounds heard. Central nervous system: Alert and oriented x 3. No focal neurological deficits. Extremities:  No edema, no cyanosis, no clubbing. Skin: No rashes, lesions or ulcers Psychiatry:  Mood & affect appropriate.  Denies any suicidal homicidal ideations.    Data Reviewed: I have personally reviewed following labs and imaging studies  CBC: Recent Labs  Lab 02/18/21 0559  WBC 9.7  HGB 14.0  HCT  42.8  MCV 99.1  PLT 214   Basic Metabolic Panel: Recent Labs  Lab 02/18/21 0559  NA 137  K 4.2  CL 102  CO2 25  GLUCOSE 95  BUN 18  CREATININE 0.91  CALCIUM 10.0  MG 1.9  PHOS 4.9*   GFR: Estimated Creatinine Clearance: 87.4 mL/min (by C-G formula based on SCr of 0.91 mg/dL). Liver Function Tests: No results for input(s): AST, ALT, ALKPHOS, BILITOT, PROT, ALBUMIN in the last 168 hours. No results for input(s): LIPASE, AMYLASE in the last 168 hours. No results for input(s): AMMONIA in the last 168 hours. Coagulation Profile: No results for input(s): INR, PROTIME in the last 168 hours. Cardiac Enzymes: No results for input(s): CKTOTAL, CKMB, CKMBINDEX, TROPONINI in the last 168 hours. BNP (last 3 results) No results for input(s): PROBNP in the last 8760 hours. HbA1C: No results for input(s): HGBA1C in the last 72 hours. CBG: No results for input(s): GLUCAP in the last 168 hours. Lipid Profile: No results for input(s): CHOL, HDL, LDLCALC, TRIG, CHOLHDL, LDLDIRECT in the last 72 hours. Thyroid Function Tests: No results for input(s): TSH, T4TOTAL, FREET4, T3FREE, THYROIDAB in the last 72 hours. Anemia Panel: No results for input(s): VITAMINB12, FOLATE, FERRITIN, TIBC, IRON, RETICCTPCT in the last 72 hours. Sepsis Labs: No results for input(s): PROCALCITON, LATICACIDVEN in the last 168 hours.  No results found for this or any previous visit (from the past 240 hour(s)).   Radiology Studies: No results found.  Scheduled Meds: . carvedilol  6.25 mg Oral BID WC  . enoxaparin (LOVENOX) injection  40 mg Subcutaneous Q24H  . feeding supplement  237 mL Oral TID BM  . folic acid  1 mg Oral Daily  . lactulose  20 g Oral BID  . magnesium oxide  400 mg Oral BID  . multivitamin with minerals  1 tablet Oral Daily  . QUEtiapine  25 mg Oral BID  . sodium chloride flush  3 mL Intravenous Q12H  . thiamine  100 mg Oral Daily  . vitamin B-12  1,000 mcg Oral Daily   Continuous  Infusions:   LOS: 57 days    Time spent: 15 mins    Giliana Vantil, MD [facility]   If 7PM-7AM, please contact night-coverage

## 2021-02-24 NOTE — Progress Notes (Signed)
PROGRESS NOTE    Alvin Clark  RXV:[id number] DOB: 1961/10/10 DOA: 12/27/2020 PCP: Patient, No Pcp Per (Inactive)    Brief Narrative: This 60 years old male with PMH significant for progressive cognitive decline secondary to alcohol abuse was brought in by EMS with acute encephalopathy (Altered mental status).  Work-up has been unrevealing.  Patient has very high risks of falls and dementia.  Altered mental status has resolved.  Patient is difficult to place,  awaiting skilled nursing facility placement.  Assessment & Plan:   Principal Problem:   AKI (acute kidney injury) (HCC) Active Problems:   Confusion   Leukocytosis   Blurry vision, bilateral   Transaminitis   Constipation   Withdrawal symptoms, alcohol (HCC)   Protein-calorie malnutrition, severe   Acute metabolic encephalopathy   Hypokalemia   Hypomagnesemia   Hypophosphatemia   Fall   Debility   Low vitamin B12 level   Chronic alcohol use  Acute metabolic encephalopathy with underlying cognitive impairment:  Patient presented with altered mentation.  Probably secondary to alcohol abuse.   CT scan head is negative.  Urine drug screen : Unremarkable. TSH unremarkable, B12 is low normal. Patient has clinically improved since admission. Patient is alert, awake and oriented to self,  place and time. Continue Seroquel and lactulose.  Acute kidney injury : > resolved Likely prerenal azotemia which is  Now resolved with IV hydration.  Elevated liver enzymes. Secondary to alcohol abuse.  Work-up been negative. Liver Enzymes normal.  Chronic alcohol use Continue thiamine, folate and multivitamins.  Constipation: Continue lactulose.  Macrocytic anemia : Continue vitamin B12.  Generalized weakness, frequent falls,  impaired mobility PT/ OT recommended skilled nursing facility placement.  DVT prophylaxis: Lovenox Code Status:Full code. Family Communication: No family Disposition Plan:  Status is:  Inpatient  Remains inpatient appropriate because:Inpatient level of care appropriate due to severity of illness   Dispo: The patient is from: Home              Anticipated d/c is to: SNF               Patient currently is medically stable for DC.   Difficult to place patient Yes   Barriers to discharge: Homeless with medical needs, no SNF beds, requiring sitter/restraints.  Family issues.,  Financial resources  Consultants:    None  Procedures: Antimicrobials:   None.  Subjective: Patient was seen and examined at bedside.  No overnight events.   Denies any questions and concerns. Patient is homeless and awaiting placement.  Objective: Vitals:   02/23/21 2128 02/24/21 0500 02/24/21 0604 02/24/21 1317  BP: 123/78  123/86 122/80  Pulse: 81  92 95  Resp: 20  18 18   Temp: 98.8 F (37.1 C)  98 F (36.7 C) 98.5 F (36.9 C)  TempSrc: Oral  Oral   SpO2: 95%  98% 96%  Weight:  69.8 kg    Height:        Intake/Output Summary (Last 24 hours) at 02/24/2021 1404 Last data filed at 02/24/2021 1239 Gross per 24 hour  Intake 650 ml  Output --  Net 650 ml   Filed Weights   02/22/21 0500 02/23/21 0500 02/24/21 0500  Weight: 72.3 kg 75.8 kg 69.8 kg    Examination:  General exam: Appears calm and comfortable, not in any acute distress. Respiratory system: Clear to auscultation. Respiratory effort normal. Cardiovascular system: S1 & S2 heard, RRR. No JVD, murmurs, rubs, gallops or clicks. No pedal edema. Gastrointestinal system:  Abdomen is nondistended, soft and nontender. No organomegaly or masses felt.  Normal bowel sounds heard. Central nervous system: Alert and oriented x 3. No focal neurological deficits. Extremities:  No edema, no cyanosis, no clubbing. Skin: No rashes, lesions or ulcers Psychiatry:  Mood & affect appropriate.  Denies any suicidal homicidal ideations.    Data Reviewed: I have personally reviewed following labs and imaging studies  CBC: Recent Labs   Lab 02/18/21 0559  WBC 9.7  HGB 14.0  HCT 42.8  MCV 99.1  PLT 214   Basic Metabolic Panel: Recent Labs  Lab 02/18/21 0559  NA 137  K 4.2  CL 102  CO2 25  GLUCOSE 95  BUN 18  CREATININE 0.91  CALCIUM 10.0  MG 1.9  PHOS 4.9*   GFR: Estimated Creatinine Clearance: 86.3 mL/min (by C-G formula based on SCr of 0.91 mg/dL). Liver Function Tests: No results for input(s): AST, ALT, ALKPHOS, BILITOT, PROT, ALBUMIN in the last 168 hours. No results for input(s): LIPASE, AMYLASE in the last 168 hours. No results for input(s): AMMONIA in the last 168 hours. Coagulation Profile: No results for input(s): INR, PROTIME in the last 168 hours. Cardiac Enzymes: No results for input(s): CKTOTAL, CKMB, CKMBINDEX, TROPONINI in the last 168 hours. BNP (last 3 results) No results for input(s): PROBNP in the last 8760 hours. HbA1C: No results for input(s): HGBA1C in the last 72 hours. CBG: No results for input(s): GLUCAP in the last 168 hours. Lipid Profile: No results for input(s): CHOL, HDL, LDLCALC, TRIG, CHOLHDL, LDLDIRECT in the last 72 hours. Thyroid Function Tests: No results for input(s): TSH, T4TOTAL, FREET4, T3FREE, THYROIDAB in the last 72 hours. Anemia Panel: No results for input(s): VITAMINB12, FOLATE, FERRITIN, TIBC, IRON, RETICCTPCT in the last 72 hours. Sepsis Labs: No results for input(s): PROCALCITON, LATICACIDVEN in the last 168 hours.  No results found for this or any previous visit (from the past 240 hour(s)).   Radiology Studies: No results found.  Scheduled Meds: . carvedilol  6.25 mg Oral BID WC  . enoxaparin (LOVENOX) injection  40 mg Subcutaneous Q24H  . feeding supplement  237 mL Oral TID BM  . folic acid  1 mg Oral Daily  . lactulose  20 g Oral BID  . magnesium oxide  400 mg Oral BID  . multivitamin with minerals  1 tablet Oral Daily  . QUEtiapine  25 mg Oral BID  . sodium chloride flush  3 mL Intravenous Q12H  . thiamine  100 mg Oral Daily  .  vitamin B-12  1,000 mcg Oral Daily   Continuous Infusions:   LOS: 58 days    Time spent: 15 mins    Lener Ventresca, MD [facility]   If 7PM-7AM, please contact night-coverage

## 2021-02-25 ENCOUNTER — Encounter (HOSPITAL_COMMUNITY): Payer: Self-pay | Admitting: Internal Medicine

## 2021-02-25 NOTE — Progress Notes (Signed)
Progress Note    Alvin Clark  YIR:[id number] DOB: 04-12-1961  DOA: 12/27/2020 PCP: Patient, No Pcp Per (Inactive)      Brief Narrative:    Medical records reviewed and are as summarized below:  Alvin Clark is a 60 y.o. male with PMH significant for progressive cognitive decline secondary to alcohol abuse was brought in by EMS with acute encephalopathy (Altered mental status).  Work-up has been unrevealing.  Patient has very high risks of falls and dementia.  Altered mental status has improved.  Awaiting placement to SNF.      Assessment/Plan:   Principal Problem:   AKI (acute kidney injury) (HCC) Active Problems:   Confusion   Leukocytosis   Blurry vision, bilateral   Transaminitis   Constipation   Withdrawal symptoms, alcohol (HCC)   Protein-calorie malnutrition, severe   Acute metabolic encephalopathy   Hypokalemia   Hypomagnesemia   Hypophosphatemia   Fall   Debility   Low vitamin B12 level   Chronic alcohol use   Nutrition Problem: Severe Malnutrition Etiology: chronic illness (chronic alcoholism)  Signs/Symptoms: severe fat depletion,severe muscle depletion   Body mass index is 24.32 kg/m.   PLAN  Continue carvedilol for hypertension Continue vitamins for alcohol use disorder Awaiting placement to SNF   Diet Order            Diet heart healthy/carb modified Room service appropriate? Yes; Fluid consistency: Thin  Diet effective now                    Consultants:  None  Procedures:  None    Medications:   . carvedilol  6.25 mg Oral BID WC  . enoxaparin (LOVENOX) injection  40 mg Subcutaneous Q24H  . feeding supplement  237 mL Oral TID BM  . folic acid  1 mg Oral Daily  . lactulose  20 g Oral BID  . magnesium oxide  400 mg Oral BID  . multivitamin with minerals  1 tablet Oral Daily  . QUEtiapine  25 mg Oral BID  . sodium chloride flush  3 mL Intravenous Q12H  . thiamine  100 mg Oral Daily  . vitamin B-12  1,000 mcg  Oral Daily   Continuous Infusions:   Anti-infectives (From admission, onward)   None             Family Communication/Anticipated D/C date and plan/Code Status   DVT prophylaxis: enoxaparin (LOVENOX) injection 40 mg Start: 12/27/20 2200     Code Status: Full Code  Family Communication: None Disposition Plan:    Status is: Inpatient  Remains inpatient appropriate because:Inpatient level of care appropriate due to severity of illness   Dispo: The patient is from: Home              Anticipated d/c is to: SNF              Patient currently is medically stable to d/c.   Difficult to place patient Yes           Subjective:   Interval events noted.  He has no complaints.  Objective:    Vitals:   02/24/21 1317 02/24/21 2020 02/25/21 0449 02/25/21 0620  BP: 122/80 132/84  113/80  Pulse: 95 94  98  Resp: 18 20  18   Temp: 98.5 F (36.9 C) 99.1 F (37.3 C)  98.3 F (36.8 C)  TempSrc:  Oral  Oral  SpO2: 96% 96%  96%  Weight:  74.7 kg   Height:       No data found.   Intake/Output Summary (Last 24 hours) at 02/25/2021 1410 Last data filed at 02/25/2021 1100 Gross per 24 hour  Intake 480 ml  Output --  Net 480 ml   Filed Weights   02/23/21 0500 02/24/21 0500 02/25/21 0449  Weight: 75.8 kg 69.8 kg 74.7 kg    Exam:  GEN: NAD SKIN: No ras EYES: EOMI ENT: MMM CV: RRR PULM: CTA B ABD: soft, ND, NT, +BS CNS: AAO x 2 (person and place), non focal EXT: No edema or tenderness        Data Reviewed:   I have personally reviewed following labs and imaging studies:  Labs: Labs show the following:   Basic Metabolic Panel: No results for input(s): NA, K, CL, CO2, GLUCOSE, BUN, CREATININE, CALCIUM, MG, PHOS in the last 168 hours. GFR Estimated Creatinine Clearance: 87.4 mL/min (by C-G formula based on SCr of 0.91 mg/dL). Liver Function Tests: No results for input(s): AST, ALT, ALKPHOS, BILITOT, PROT, ALBUMIN in the last 168 hours. No  results for input(s): LIPASE, AMYLASE in the last 168 hours. No results for input(s): AMMONIA in the last 168 hours. Coagulation profile No results for input(s): INR, PROTIME in the last 168 hours.  CBC: No results for input(s): WBC, NEUTROABS, HGB, HCT, MCV, PLT in the last 168 hours. Cardiac Enzymes: No results for input(s): CKTOTAL, CKMB, CKMBINDEX, TROPONINI in the last 168 hours. BNP (last 3 results) No results for input(s): PROBNP in the last 8760 hours. CBG: No results for input(s): GLUCAP in the last 168 hours. D-Dimer: No results for input(s): DDIMER in the last 72 hours. Hgb A1c: No results for input(s): HGBA1C in the last 72 hours. Lipid Profile: No results for input(s): CHOL, HDL, LDLCALC, TRIG, CHOLHDL, LDLDIRECT in the last 72 hours. Thyroid function studies: No results for input(s): TSH, T4TOTAL, T3FREE, THYROIDAB in the last 72 hours.  Invalid input(s): FREET3 Anemia work up: No results for input(s): VITAMINB12, FOLATE, FERRITIN, TIBC, IRON, RETICCTPCT in the last 72 hours. Sepsis Labs: No results for input(s): PROCALCITON, WBC, LATICACIDVEN in the last 168 hours.  Microbiology No results found for this or any previous visit (from the past 240 hour(s)).  Procedures and diagnostic studies:  No results found.             LOS: 59 days   Kyrillos Adams  [facility] [employer]er on [email or web address]. If 7PM-7AM, please contact night-coverage at [email or web address]     02/25/2021, 2:10 PM

## 2021-02-26 NOTE — Progress Notes (Signed)
Progress Note    Alvin Clark  YKD:[id number] DOB: 01/22/1961  DOA: 12/27/2020 PCP: Patient, No Pcp Per (Inactive)      Brief Narrative:    Medical records reviewed and are as summarized below:  Alvin Clark is a 60 y.o. male with PMH significant for progressive cognitive decline secondary to alcohol abuse was brought in by EMS with acute encephalopathy (Altered mental status).  Work-up has been unrevealing.  Patient has very high risks of falls and dementia.  Altered mental status has improved.  Awaiting placement to SNF.      Assessment/Plan:   Principal Problem:   AKI (acute kidney injury) (HCC) Active Problems:   Confusion   Leukocytosis   Blurry vision, bilateral   Transaminitis   Constipation   Withdrawal symptoms, alcohol (HCC)   Protein-calorie malnutrition, severe   Acute metabolic encephalopathy   Hypokalemia   Hypomagnesemia   Hypophosphatemia   Fall   Debility   Low vitamin B12 level   Chronic alcohol use   Nutrition Problem: Severe Malnutrition Etiology: chronic illness (chronic alcoholism)  Signs/Symptoms: severe fat depletion,severe muscle depletion   Body mass index is 22.92 kg/m.   PLAN  Continue current medications. Awaiting placement to SNF.  Follow-up with social worker to assist with disposition.   Diet Order            Diet heart healthy/carb modified Room service appropriate? Yes; Fluid consistency: Thin  Diet effective now                    Consultants:  None  Procedures:  None    Medications:   . carvedilol  6.25 mg Oral BID WC  . enoxaparin (LOVENOX) injection  40 mg Subcutaneous Q24H  . feeding supplement  237 mL Oral TID BM  . folic acid  1 mg Oral Daily  . lactulose  20 g Oral BID  . magnesium oxide  400 mg Oral BID  . multivitamin with minerals  1 tablet Oral Daily  . QUEtiapine  25 mg Oral BID  . sodium chloride flush  3 mL Intravenous Q12H  . thiamine  100 mg Oral Daily  . vitamin B-12   1,000 mcg Oral Daily   Continuous Infusions:   Anti-infectives (From admission, onward)   None             Family Communication/Anticipated D/C date and plan/Code Status   DVT prophylaxis: enoxaparin (LOVENOX) injection 40 mg Start: 12/27/20 2200     Code Status: Full Code  Family Communication: None Disposition Plan:    Status is: Inpatient  Remains inpatient appropriate because:Inpatient level of care appropriate due to severity of illness   Dispo: The patient is from: Home              Anticipated d/c is to: SNF              Patient currently is medically stable to d/c.   Difficult to place patient Yes           Subjective:   Interval events noted.  He feels well and has no complaints.  No shortness of breath or chest pain.  Objective:    Vitals:   02/25/21 0620 02/25/21 1421 02/25/21 2021 02/26/21 0511  BP: 113/80 123/80 121/75 119/86  Pulse: 98 90 89 82  Resp: 18 17 18 17   Temp: 98.3 F (36.8 C)  98.4 F (36.9 C) 98 F (36.7 C)  TempSrc: Oral  Oral  SpO2: 96% 95% 94% 95%  Weight:    70.4 kg  Height:       No data found.   Intake/Output Summary (Last 24 hours) at 02/26/2021 1326 Last data filed at 02/26/2021 1321 Gross per 24 hour  Intake 1432 ml  Output --  Net 1432 ml   Filed Weights   02/24/21 0500 02/25/21 0449 02/26/21 0511  Weight: 69.8 kg 74.7 kg 70.4 kg    Exam:  GEN: NAD SKIN: No rash EYES: EOMI ENT: MMM CV: RRR PULM: CTA B ABD: soft, ND, NT, +BS CNS: AAO x 3, non focal EXT: No edema or tenderness      Data Reviewed:   I have personally reviewed following labs and imaging studies:  Labs: Labs show the following:   Basic Metabolic Panel: No results for input(s): NA, K, CL, CO2, GLUCOSE, BUN, CREATININE, CALCIUM, MG, PHOS in the last 168 hours. GFR Estimated Creatinine Clearance: 87 mL/min (by C-G formula based on SCr of 0.91 mg/dL). Liver Function Tests: No results for input(s): AST, ALT, ALKPHOS,  BILITOT, PROT, ALBUMIN in the last 168 hours. No results for input(s): LIPASE, AMYLASE in the last 168 hours. No results for input(s): AMMONIA in the last 168 hours. Coagulation profile No results for input(s): INR, PROTIME in the last 168 hours.  CBC: No results for input(s): WBC, NEUTROABS, HGB, HCT, MCV, PLT in the last 168 hours. Cardiac Enzymes: No results for input(s): CKTOTAL, CKMB, CKMBINDEX, TROPONINI in the last 168 hours. BNP (last 3 results) No results for input(s): PROBNP in the last 8760 hours. CBG: No results for input(s): GLUCAP in the last 168 hours. D-Dimer: No results for input(s): DDIMER in the last 72 hours. Hgb A1c: No results for input(s): HGBA1C in the last 72 hours. Lipid Profile: No results for input(s): CHOL, HDL, LDLCALC, TRIG, CHOLHDL, LDLDIRECT in the last 72 hours. Thyroid function studies: No results for input(s): TSH, T4TOTAL, T3FREE, THYROIDAB in the last 72 hours.  Invalid input(s): FREET3 Anemia work up: No results for input(s): VITAMINB12, FOLATE, FERRITIN, TIBC, IRON, RETICCTPCT in the last 72 hours. Sepsis Labs: No results for input(s): PROCALCITON, WBC, LATICACIDVEN in the last 168 hours.  Microbiology No results found for this or any previous visit (from the past 240 hour(s)).  Procedures and diagnostic studies:  No results found.             LOS: 60 days   Shauntia Levengood  [facility] [employer] adjuster on [email or web address]. If 7PM-7AM, please contact night-coverage at [email or web address]     02/26/2021, 1:26 PM

## 2021-02-27 LAB — BASIC METABOLIC PANEL
Anion gap: 7 (ref 5–15)
BUN: 22 mg/dL — ABNORMAL HIGH (ref 6–20)
CO2: 28 mmol/L (ref 22–32)
Calcium: 9.9 mg/dL (ref 8.9–10.3)
Chloride: 103 mmol/L (ref 98–111)
Creatinine, Ser: 0.99 mg/dL (ref 0.61–1.24)
GFR, Estimated: >60 mL/min (ref >60)
Glucose, Bld: 99 mg/dL (ref 70–99)
Potassium: 4.4 mmol/L (ref 3.5–5.1)
Sodium: 138 mmol/L (ref 135–145)

## 2021-02-27 LAB — CBC WITH DIFFERENTIAL/PLATELET
Abs Immature Granulocytes: 0.05 10*3/uL (ref 0.00–0.07)
Basophils Absolute: 0.1 10*3/uL (ref 0.0–0.1)
Basophils Relative: 1 %
Eosinophils Absolute: 0.4 10*3/uL (ref 0.0–0.5)
Eosinophils Relative: 4 %
HCT: 40.9 % (ref 39.0–52.0)
Hemoglobin: 13.3 g/dL (ref 13.0–17.0)
Immature Granulocytes: 1 %
Lymphocytes Relative: 35 %
Lymphs Abs: 3.2 10*3/uL (ref 0.7–4.0)
MCH: 32 pg (ref 26.0–34.0)
MCHC: 32.5 g/dL (ref 30.0–36.0)
MCV: 98.6 fL (ref 80.0–100.0)
Monocytes Absolute: 1.1 10*3/uL — ABNORMAL HIGH (ref 0.1–1.0)
Monocytes Relative: 12 %
Neutro Abs: 4.4 10*3/uL (ref 1.7–7.7)
Neutrophils Relative %: 47 %
Platelets: 209 10*3/uL (ref 150–400)
RBC: 4.15 MIL/uL — ABNORMAL LOW (ref 4.22–5.81)
RDW: 12.4 % (ref 11.5–15.5)
WBC: 9.2 10*3/uL (ref 4.0–10.5)
nRBC: 0 % (ref 0.0–0.2)

## 2021-02-27 LAB — MAGNESIUM: Magnesium: 1.9 mg/dL (ref 1.7–2.4)

## 2021-02-27 LAB — PHOSPHORUS: Phosphorus: 4.7 mg/dL — ABNORMAL HIGH (ref 2.5–4.6)

## 2021-02-27 NOTE — Progress Notes (Signed)
Progress Note    Alvin Clark  VOH:[id number] DOB: 1961-08-04  DOA: 12/27/2020 PCP: Patient, No Pcp Per (Inactive)      Brief Narrative:    Medical records reviewed and are as summarized below:  Alvin Clark is a 60 y.o. male with PMH significant for progressive cognitive decline secondary to alcohol abuse was brought in by EMS with acute encephalopathy (Altered mental status).  Work-up has been unrevealing.  Patient has very high risks of falls and dementia.  Altered mental status has improved.  Awaiting placement to SNF.      Assessment/Plan:   Principal Problem:   AKI (acute kidney injury) (HCC) Active Problems:   Confusion   Leukocytosis   Blurry vision, bilateral   Transaminitis   Constipation   Withdrawal symptoms, alcohol (HCC)   Protein-calorie malnutrition, severe   Acute metabolic encephalopathy   Hypokalemia   Hypomagnesemia   Hypophosphatemia   Fall   Debility   Low vitamin B12 level   Chronic alcohol use   Nutrition Problem: Severe Malnutrition Etiology: chronic illness (chronic alcoholism)  Signs/Symptoms: severe fat depletion,severe muscle depletion   Body mass index is 23.41 kg/m.   PLAN  Continue current medications Awaiting placement to SNF.  Follow-up with social worker to assist with disposition.   Diet Order            Diet heart healthy/carb modified Room service appropriate? Yes; Fluid consistency: Thin  Diet effective now                    Consultants:  None  Procedures:  None    Medications:   . carvedilol  6.25 mg Oral BID WC  . enoxaparin (LOVENOX) injection  40 mg Subcutaneous Q24H  . feeding supplement  237 mL Oral TID BM  . folic acid  1 mg Oral Daily  . lactulose  20 g Oral BID  . magnesium oxide  400 mg Oral BID  . multivitamin with minerals  1 tablet Oral Daily  . QUEtiapine  25 mg Oral BID  . sodium chloride flush  3 mL Intravenous Q12H  . thiamine  100 mg Oral Daily  . vitamin B-12   1,000 mcg Oral Daily   Continuous Infusions:   Anti-infectives (From admission, onward)   None             Family Communication/Anticipated D/C date and plan/Code Status   DVT prophylaxis: enoxaparin (LOVENOX) injection 40 mg Start: 12/27/20 2200     Code Status: Full Code  Family Communication: None Disposition Plan:    Status is: Inpatient  Remains inpatient appropriate because:Inpatient level of care appropriate due to severity of illness   Dispo: The patient is from: Home              Anticipated d/c is to: SNF              Patient currently is medically stable to d/c.   Difficult to place patient Yes           Subjective:   No acute events overnight. No complaints  Objective:    Vitals:   02/26/21 1436 02/26/21 2002 02/27/21 0425 02/27/21 0500  BP: (!) 124/93 130/80 109/76   Pulse: 98 91 86   Resp: 20 18 18    Temp: 98.1 F (36.7 C) 98.5 F (36.9 C) 98 F (36.7 C)   TempSrc:   Oral   SpO2: 95% 96% 96%   Weight:  71.9 kg  Height:       No data found.  No intake or output data in the 24 hours ending 02/27/21 1410 Filed Weights   02/25/21 0449 02/26/21 0511 02/27/21 0500  Weight: 74.7 kg 70.4 kg 71.9 kg    Exam:  GEN: NAD SKIN: Warm and dry EYES: EOMI ENT: MMM CV: RRR PULM: CTA B ABD: soft, ND, NT, +BS CNS: AAO x 3, non focal EXT: No edema or tenderness      Data Reviewed:   I have personally reviewed following labs and imaging studies:  Labs: Labs show the following:   Basic Metabolic Panel: Recent Labs  Lab 02/27/21 0524  NA 138  K 4.4  CL 103  CO2 28  GLUCOSE 99  BUN 22*  CREATININE 0.99  CALCIUM 9.9  MG 1.9  PHOS 4.7*   GFR Estimated Creatinine Clearance: 80.3 mL/min (by C-G formula based on SCr of 0.99 mg/dL). Liver Function Tests: No results for input(s): AST, ALT, ALKPHOS, BILITOT, PROT, ALBUMIN in the last 168 hours. No results for input(s): LIPASE, AMYLASE in the last 168 hours. No  results for input(s): AMMONIA in the last 168 hours. Coagulation profile No results for input(s): INR, PROTIME in the last 168 hours.  CBC: Recent Labs  Lab 02/27/21 0524  WBC 9.2  NEUTROABS 4.4  HGB 13.3  HCT 40.9  MCV 98.6  PLT 209   Cardiac Enzymes: No results for input(s): CKTOTAL, CKMB, CKMBINDEX, TROPONINI in the last 168 hours. BNP (last 3 results) No results for input(s): PROBNP in the last 8760 hours. CBG: No results for input(s): GLUCAP in the last 168 hours. D-Dimer: No results for input(s): DDIMER in the last 72 hours. Hgb A1c: No results for input(s): HGBA1C in the last 72 hours. Lipid Profile: No results for input(s): CHOL, HDL, LDLCALC, TRIG, CHOLHDL, LDLDIRECT in the last 72 hours. Thyroid function studies: No results for input(s): TSH, T4TOTAL, T3FREE, THYROIDAB in the last 72 hours.  Invalid input(s): FREET3 Anemia work up: No results for input(s): VITAMINB12, FOLATE, FERRITIN, TIBC, IRON, RETICCTPCT in the last 72 hours. Sepsis Labs: Recent Labs  Lab 02/27/21 0524  WBC 9.2    Microbiology No results found for this or any previous visit (from the past 240 hour(s)).  Procedures and diagnostic studies:  No results found.             LOS: 61 days   Shelbi Vaccaro  [facility] [employer] loss adjuster on [email or web address]. If 7PM-7AM, please contact night-coverage at [email or web address]     02/27/2021, 2:10 PM

## 2021-02-28 NOTE — Progress Notes (Signed)
[facility] PROGRESS NOTE    Alvin Clark  DEY:[id number] DOB: [birth year] DOA: 12/27/2020 PCP: Patient, No Pcp Per (Inactive)      Brief Narrative:  Alvin Clark is a 60 y.o. M with alcohol use presented with confusion.  Per admission report, patient brought in by [facility] PD after trying to re-enter his home after eviction and being found confused, frail and disoriented.  In the ER, patient tachycardic to 100s, BP 90-100s systolic, Cr 1.83 from baseline 0.8, AST/ALT ~100, ethanol negative, CT head with chronic atrophy, CXR clear.    Started on fluids and thiamine and admited for AKI and dehydration in setting of alcohol use.          Assessment & Plan:  Alcohol related dementia Acute metabolic encephalopathy ruled out.  Patient presented with some confusion, but this appears to be his current baseline due to progression of alcohol-related dementia. -Continue thiamine and folate -Continue Seroquel    Severe protein calorie malnutrition due to alcohol use disorder, severe Failure to thrive due to alcoholism Patient with severe depletion of subcutaneous muscle mass and severe depletion of subcutaneous fat - Continue nutritional supplements   Acute kidney injury Baseline Cr 0.9-1.0, presented with Cr 1.8, resolved to baseline with fluids  Hypertension BP controlled -Continue carvedilol    Transaminitis due to alcohol use Resolved  Macrocytic anemia due to alcohol        Disposition: Status is: Inpatient  Remains inpatient appropriate because:Unsafe d/c plan   Dispo: The patient is from: Home              Anticipated d/c is to: Group home              Patient currently is medically stable to d/c.   Difficult to place patient Yes       Level of care: Med-Surg       MDM: The below labs and imaging reports were reviewed and summarized above.  Medication management as above.     DVT prophylaxis: enoxaparin (LOVENOX)  injection 40 mg Start: 12/27/20 2200  Code Status: FULL Family Communication: None present             Subjective: No headache, fever, chest pain, dyspnea, vomiting.  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   Some mild right hip pain from laying on it.    Objective: Vitals:   02/27/21 1956 02/28/21 0416 02/28/21 0430 02/28/21 0820  BP: (!) 141/87 125/80  121/86  Pulse: 88 88  93  Resp: 18 18    Temp: 98 F (36.7 C) 98 F (36.7 C)    TempSrc: Oral Oral    SpO2: 96% 98%    Weight:   72.8 kg   Height:        Intake/Output Summary (Last 24 hours) at 02/28/2021 1316 Last data filed at 02/28/2021 0824 Gross per 24 hour  Intake 1425 ml  Output --  Net 1425 ml   Filed Weights   02/26/21 0511 02/27/21 0500 02/28/21 0430  Weight: 70.4 kg 71.9 kg 72.8 kg    Examination: General appearance:  adult male, alert and in no acute distress.   HEENT: Anicteric, conjunctiva pink, lids and lashes normal. No nasal deformity, discharge, epistaxis.  Lips moist, dentition in poor repair, OP normal, no lesions.   Skin: Warm and dry.  No jaundice.  No suspicious rashes or lesions. Cardiac: RRR, nl S1-S2, no murmurs appreciated.  Capillary refill is brisk.  JVP normal.  No LE edema.  Radial pulses 2+ and symmetric. Respiratory: Normal respiratory rate and rhythm.  CTAB without rales or wheezes. Abdomen: Abdomen soft.  No TTP. No ascites, distension, hepatosplenomegaly.   MSK: Severe depletion of fat.  Severe diffuse loss of muscle mass. Neuro: Awake and alert.  EOMI, moves all extremities. Speech fluent.    Psych: Sensorium intact and responding to questions, attention normal. Affect pleasant.  Judgment and insight appear confused.  Oriented to self, hospital, otherwise confused about why he is here, not at all coherent with respect to hospital course to this point.    Data Reviewed: I have personally reviewed following labs and imaging studies:  CBC: Recent Labs  Lab  03/21/2021 0524  WBC 9.2  NEUTROABS 4.4  HGB 13.3  HCT 40.9  MCV 98.6  PLT 209   Basic Metabolic Panel: Recent Labs  Lab 03-21-2021 0524  NA 138  K 4.4  CL 103  CO2 28  GLUCOSE 99  BUN 22*  CREATININE 0.99  CALCIUM 9.9  MG 1.9  PHOS 4.7*   GFR: Estimated Creatinine Clearance: 80.3 mL/min (by C-G formula based on SCr of 0.99 mg/dL). Liver Function Tests: No results for input(s): AST, ALT, ALKPHOS, BILITOT, PROT, ALBUMIN in the last 168 hours. No results for input(s): LIPASE, AMYLASE in the last 168 hours. No results for input(s): AMMONIA in the last 168 hours. Coagulation Profile: No results for input(s): INR, PROTIME in the last 168 hours. Cardiac Enzymes: No results for input(s): CKTOTAL, CKMB, CKMBINDEX, TROPONINI in the last 168 hours. BNP (last 3 results) No results for input(s): PROBNP in the last 8760 hours. HbA1C: No results for input(s): HGBA1C in the last 72 hours. CBG: No results for input(s): GLUCAP in the last 168 hours. Lipid Profile: No results for input(s): CHOL, HDL, LDLCALC, TRIG, CHOLHDL, LDLDIRECT in the last 72 hours. Thyroid Function Tests: No results for input(s): TSH, T4TOTAL, FREET4, T3FREE, THYROIDAB in the last 72 hours. Anemia Panel: No results for input(s): VITAMINB12, FOLATE, FERRITIN, TIBC, IRON, RETICCTPCT in the last 72 hours. Urine analysis:    Component Value Date/Time   COLORURINE YELLOW 12/28/2020 0000   APPEARANCEUR HAZY (A) 12/28/2020 0000   LABSPEC 1.025 12/28/2020 0000   PHURINE 5.0 12/28/2020 0000   GLUCOSEU NEGATIVE 12/28/2020 0000   HGBUR NEGATIVE 12/28/2020 0000   BILIRUBINUR NEGATIVE 12/28/2020 0000   KETONESUR NEGATIVE 12/28/2020 0000   PROTEINUR NEGATIVE 12/28/2020 0000   UROBILINOGEN 1.0 05/31/2014 1841   NITRITE NEGATIVE 12/28/2020 0000   LEUKOCYTESUR NEGATIVE 12/28/2020 0000   Sepsis Labs: @LABRCNTIP (procalcitonin:4,lacticacidven:4)  )No results found for this or any previous visit (from the past 240  hour(s)).       Radiology Studies: No results found.      Scheduled Meds: . carvedilol  6.25 mg Oral BID WC  . enoxaparin (LOVENOX) injection  40 mg Subcutaneous Q24H  . feeding supplement  237 mL Oral TID BM  . folic acid  1 mg Oral Daily  . lactulose  20 g Oral BID  . magnesium oxide  400 mg Oral BID  . multivitamin with minerals  1 tablet Oral Daily  . QUEtiapine  25 mg Oral BID  . sodium chloride flush  3 mL Intravenous Q12H  . thiamine  100 mg Oral Daily  . vitamin B-12  1,000 mcg Oral Daily   Continuous Infusions:   LOS: 62 days    Time spent: 25 minutes    , MD [facility] 02/28/2021, 1:16 PM  Please page though AMION or Epic secure chat:  For [employer], Higher education careers adviser

## 2021-03-01 DIAGNOSIS — F1027 Alcohol dependence with alcohol-induced persisting dementia: Principal | ICD-10-CM

## 2021-03-01 DIAGNOSIS — I10 Essential (primary) hypertension: Secondary | ICD-10-CM

## 2021-03-01 NOTE — TOC Progression Note (Signed)
Transition of Care [facility]) - Progression Note    Patient Details  Name: Alvin Clark MRN: [id number] Date of Birth: 02/03/1961  Transition of Care [facility]) CM/SW Contact  Darleene Cleaver, [place] Phone Number: 03/01/2021, 3:02 PM  Clinical Narrative:     CSW spoke to [place] at [facility], she has beds available in memory care.  CSW explained the situation in regards to patient's medical Medicaid still pending.  She will review patient's information and call CSW back to see if she can accept patient.  CSW also tried to contact [employer] again, left a message awaiting for a call back.  CSW tried calling [facility] again, and had to leave a message.  Expected Discharge Plan: Assisted Living (versus family care home) Barriers to Discharge: Financial Resources,Homeless with medical needs,Continued Medical Work up,No SNF bed,Requiring sitter/restraints,SNF Pending payor source - LOG,SNF Pending Medicaid,Family Issues  Expected Discharge Plan and Services Expected Discharge Plan: Assisted Living (versus family care home) In-house Referral: Clinical Social Work,Financial Counselor     Living arrangements for the past 2 months: No permanent address                                       Social Determinants of Health (SDOH) Interventions    Readmission Risk Interventions No flowsheet data found.

## 2021-03-01 NOTE — Evaluation (Signed)
Speech Language Pathology Evaluation Patient Details Name: MARGARET STAGGS MRN: [id number] DOB: August 18, 1961 Today's Date: 03/01/2021 Time: 7096-2836 SLP Time Calculation (min) (ACUTE ONLY): 30 min  Problem List:  Patient Active Problem List   Diagnosis Date Noted  . Acute metabolic encephalopathy   . Hypokalemia   . Hypomagnesemia   . Hypophosphatemia   . Fall   . Debility   . Low vitamin B12 level   . Chronic alcohol use   . Protein-calorie malnutrition, severe 12/29/2020  . Withdrawal symptoms, alcohol (HCC) 12/28/2020  . AKI (acute kidney injury) (HCC) 12/27/2020  . Confusion 12/27/2020  . Leukocytosis 12/27/2020  . Blurry vision, bilateral 12/27/2020  . Transaminitis 12/27/2020  . Constipation 12/27/2020   Past Medical History:  Past Medical History:  Diagnosis Date  . Alcohol use    Past Surgical History: History reviewed. No pertinent surgical history. HPI:  60 yo male adm 12/28/2020 with confusion, disorientation- he was trying to get back into his home after eviction.  Pt was found to be dehydrated with AKI and has been treated.   Brain imaging 3/10 Age advanced generalized cerebral volume loss, white matter changes.  Pt with h/o ETOH use with progressive cognitive decline.  Cognitive evaluation ordered - in particular SLUMS.   Assessment / Plan / Recommendation Clinical Impression  Pt found awake in bed, watching tv and friendly - had difficulty operating the call bell to turn off the tv.  SLP introduced self and pt reports being informed that cognitive screen was ordered.  SLUMS administered with pt scoring 14/30.  Per scoring key on test, scores of 1-20 are coorelated with dementia for people with a high school education.    Strengths included orientation to self and current location *state*, clock drawing  - visuospatial skills.   Deficits noted in word fluency (named 11 animals in 60 seconds), working memory requiring cognitive manipulation *repeating numbers in  backward order (able with only 2 numbers in series). Memory testing showed clinical increased deficits in retrieval rather than storage of information.  Pt did not recall any items without cues, 1/5 with category cue, 3/5 with multiple choice and did not correctly identify one.  Recall of information from narrative was difficult from later in the story.  Pt reports that his memory has been bad for "years" and he did use basic problem solving independently and wrote the time for clock drawing prior to initiating drawing - demonstrating some compensation awareness.  Pt reports his cognition has declined since he is "not getting outside" and "doing things".    SLP provided visual cues to help pt with orientation by writing current date on the board providing min cues for reference later in session.    Will follow up x1 to provide pt with reading glasses and agenda to help him stay oriented and assist in compensation for memory deficits.  Thanks for this order.    SLP Assessment  SLP Recommendation/Assessment: Patient needs continued Speech Lanaguage Pathology Services SLP Visit Diagnosis: Cognitive communication deficit (R41.841)    Follow Up Recommendations  None    Frequency and Duration min 1 x/week  1 week      SLP Evaluation Cognition  Overall Cognitive Status: No family/caregiver present to determine baseline cognitive functioning Arousal/Alertness: Awake/alert Orientation Level: Oriented to person;Oriented to place;Disoriented to time;Disoriented to situation Attention: Sustained Sustained Attention: Impaired Memory: Impaired Awareness: Impaired       Comprehension  Auditory Comprehension Overall Auditory Comprehension: Appears within functional limits for tasks assessed  Yes/No Questions: Not tested Commands: Within Functional Limits Conversation: Simple Visual Recognition/Discrimination Discrimination: Not tested Reading Comprehension Reading Status: Within funtional limits  (for tasks assessed, pt able to read and interpret signs in his room as well as information written on the board without difficulty)    Expression Expression Primary Mode of Expression: Verbal Verbal Expression Overall Verbal Expression: Appears within functional limits for tasks assessed Initiation:  (DNT) Written Expression Dominant Hand: Right Written Expression: Not tested (pt able to draw clock with correct time, used problem solving by writing down time before starting drawing)   Oral / Motor  Oral Motor/Sensory Function Overall Oral Motor/Sensory Function: Within functional limits Motor Speech Overall Motor Speech: Appears within functional limits for tasks assessed Resonance: Within functional limits Articulation: Within functional limitis Motor Planning: Witnin functional limits   GO                    [name] 03/01/2021, 4:30 PM  [name], MS [facility] SLP Acute Rehab Services Office [phone] Pager [phone]

## 2021-03-01 NOTE — TOC Progression Note (Signed)
Transition of Care [facility]) - Progression Note    Patient Details  Name: Alvin Clark MRN: [id number] Date of Birth: 12/04/1960  Transition of Care [facility]) CM/SW Contact  Darleene Cleaver, [place] Phone Number: 03/01/2021, 10:04 AM  Clinical Narrative:     CSW left message for [facility] and [facility], waiting for a call back.  CSW also contacted [facility] and left a message.  [facility] and  [facility] do not accept Medicaid patients.   Expected Discharge Plan: Assisted Living (versus family care home) Barriers to Discharge: Financial Resources,Homeless with medical needs,Continued Medical Work up,No SNF bed,Requiring sitter/restraints,SNF Pending payor source - LOG,SNF Pending Medicaid,Family Issues  Expected Discharge Plan and Services Expected Discharge Plan: Assisted Living (versus family care home) In-house Referral: Clinical Social Work,Financial Counselor     Living arrangements for the past 2 months: No permanent address                                       Social Determinants of Health (SDOH) Interventions    Readmission Risk Interventions No flowsheet data found.

## 2021-03-01 NOTE — NC FL2 (Signed)
[place] MEDICAID FL2 LEVEL OF CARE SCREENING TOOL     IDENTIFICATION  Patient Name: Alvin Clark Birthdate: 25-Apr-1961 Sex: male Admission Date (Current Location): 12/27/2020  [place] and [place] Number:  Haynes Bast [id number] Facility and Address:  [facility],  [address], [place] [address]      Provider Number: [id number]  Attending Physician Name and Address:  Alberteen Sam, *  Relative Name and Phone Number:  Sadrac, Zeoli Sister [phone]  [phone]    Current Level of Care: Hospital Recommended Level of Care: Assisted Living [facility] Care Prior Approval Number:    Date Approved/Denied:   PASRR Number: [id number]  Discharge Plan: Domiciliary (Rest home) (ALF Memory Care)    Current Diagnoses: Patient Active Problem List   Diagnosis Date Noted  . Acute metabolic encephalopathy   . Hypokalemia   . Hypomagnesemia   . Hypophosphatemia   . Fall   . Debility   . Low vitamin B12 level   . Chronic alcohol use   . Protein-calorie malnutrition, severe 12/29/2020  . Withdrawal symptoms, alcohol (HCC) 12/28/2020  . AKI (acute kidney injury) (HCC) 12/27/2020  . Confusion 12/27/2020  . Leukocytosis 12/27/2020  . Blurry vision, bilateral 12/27/2020  . Transaminitis 12/27/2020  . Constipation 12/27/2020    Orientation RESPIRATION BLADDER Height & Weight     Self,Place  Normal Continent Weight: 161 lb 13.1 oz (73.4 kg) Height:  5\' 9"  (175.3 cm)  BEHAVIORAL SYMPTOMS/MOOD NEUROLOGICAL BOWEL NUTRITION STATUS      Continent Diet (Carb Modified)  AMBULATORY STATUS COMMUNICATION OF NEEDS Skin   Supervision Verbally Normal                       Personal Care Assistance Level of Assistance  Bathing,Feeding,Dressing Bathing Assistance: Limited assistance Feeding assistance: Independent Dressing Assistance: Independent     Functional Limitations Info  Sight,Hearing,Speech Sight Info: Adequate Hearing Info:  Adequate Speech Info: Adequate    SPECIAL CARE FACTORS FREQUENCY  PT (By licensed PT),OT (By licensed OT)     PT Frequency: Minimum 5x a week OT Frequency: Minimum 5x a week            Contractures Contractures Info: Not present    Additional Factors Info  Code Status,Allergies,Psychotropic Code Status Info: Full Code Allergies Info: NKA Psychotropic Info: QUEtiapine (SEROQUEL) tablet 25 mg         Current Medications (03/01/2021):  This is the current hospital active medication list Current Facility-Administered Medications  Medication Dose Route Frequency Provider Last Rate Last Admin  . acetaminophen (TYLENOL) tablet 650 mg  650 mg Oral Q6H PRN 05/01/2021, MD   650 mg at 02/21/21 0530  . carvedilol (COREG) tablet 6.25 mg  6.25 mg Oral BID WC 04/23/21 T, MD   6.25 mg at 03/01/21 0834  . enoxaparin (LOVENOX) injection 40 mg  40 mg Subcutaneous Q24H 05/01/21, MD   40 mg at 02/28/21 2040  . feeding supplement (ENSURE ENLIVE / ENSURE PLUS) liquid 237 mL  237 mL Oral TID BM Dahal, Binaya, MD   237 mL at 03/01/21 1333  . folic acid (FOLVITE) tablet 1 mg  1 mg Oral Daily 05/01/21, MD   1 mg at 03/01/21 0834  . haloperidol lactate (HALDOL) injection 2 mg  2 mg Intramuscular Q6H PRN 05/01/21, MD   2 mg at 01/02/21 1833  . lactulose (CHRONULAC) 10 GM/15ML solution 20 g  20 g Oral BID  Almon Hercules, MD   20 g at 03/01/21 0834  . magnesium oxide TABS 400 mg  400 mg Oral BID Rodolph Bong, MD   400 mg at 03/01/21 0834  . multivitamin with minerals tablet 1 tablet  1 tablet Oral Daily Synetta Fail, MD   1 tablet at 03/01/21 [phone]  . QUEtiapine (SEROQUEL) tablet 25 mg  25 mg Oral BID Lorin Glass, MD   25 mg at 03/01/21 0833  . sodium chloride flush (NS) 0.9 % injection 3 mL  3 mL Intravenous Q12H Synetta Fail, MD   3 mL at 02/01/21 1105  . thiamine tablet 100 mg  100 mg Oral Daily Zigmund Daniel., MD   100 mg at 03/01/21 0834  .  vitamin B-12 (CYANOCOBALAMIN) tablet 1,000 mcg  1,000 mcg Oral Daily Zigmund Daniel., MD   1,000 mcg at 03/01/21 7741     Discharge Medications: Please see discharge summary for a list of discharge medications.  Relevant Imaging Results:  Relevant Lab Results:   Additional Information SSN [id number]  Darleene Cleaver, LCSW

## 2021-03-01 NOTE — Progress Notes (Signed)
[facility] PROGRESS NOTE    Alvin Clark  YQM:[id number] DOB: 1960-10-29 DOA: 12/27/2020 PCP: Patient, No Pcp Per (Inactive)      Brief Narrative:  Alvin Clark is a 60 y.o. M with alcohol use presented with confusion.  Per admission report, patient brought in by [facility] after trying to re-enter his home after eviction and being found confused, frail and disoriented.  In the ER, patient tachycardic to 100s, BP 90-100s systolic, Cr 1.83 from baseline 0.8, AST/ALT ~100, ethanol negative, CT head with chronic atrophy, CXR clear.    Started on fluids and thiamine and admited for AKI and dehydration in setting of alcohol use.          Assessment & Plan:  Alcohol-related dementia Acute metabolic encephalopathy ruled out.  Patient presented with some confusion, but this appears to be his current baseline due to progression of alcohol-related dementia.  Now stable -Continue thiamine and folate -Continue Seroquel    Severe protein calorie malnutrition due to alcohol use disorder, severe Failure to thrive due to alcoholism Patient with severe depletion of subcutaneous muscle mass and severe depletion of subcutaneous fat - Continue nutritional supplements   Acute kidney injury Baseline Cr 0.9-1.0, presented with Cr 1.8, resolved to baseline with fluids  Hypertension Blood pressure controlled -Continue carvedilol   Transaminitis due to alcohol use Resolved to normal   Macrocytic anemia due to alcohol Macrocytosis and anemia resolved -Continue B12 supplement until discharge       Disposition: Status is: Inpatient  Remains inpatient appropriate because:Unsafe d/c plan   Dispo: The patient is from: Home              Anticipated d/c is to: Group home              Patient currently is medically stable to d/c.   Difficult to place patient Yes       Level of care: Med-Surg       MDM: The below labs and imaging reports were  reviewed and summarized above.  Medication management as above.     DVT prophylaxis: enoxaparin (LOVENOX) injection 40 mg Start: 12/27/20 2200  Code Status: FULL Family Communication: None present             Subjective: No new fever, respiratory distress, headache, chest pain, abdominal pain, vomiting.       Objective: Vitals:   03/01/21 0417 03/01/21 0547 03/01/21 0810 03/01/21 1102  BP:  121/87    Pulse:  86    Resp:  18    Temp:  97.7 F (36.5 C) 97.6 F (36.4 C) 97.9 F (36.6 C)  TempSrc:  Oral Oral Oral  SpO2:  96%    Weight: 73.4 kg     Height:        Intake/Output Summary (Last 24 hours) at 03/01/2021 1159 Last data filed at 02/28/2021 1738 Gross per 24 hour  Intake 1652 ml  Output --  Net 1652 ml   Filed Weights   02/27/21 0500 02/28/21 0430 03/01/21 0417  Weight: 71.9 kg 72.8 kg 73.4 kg    Examination: General appearance: Adult male, sitting in bed, watching television, no obvious distress     HEENT: Anicteric, conjunctive are pink, lids and lashes normal.   Skin: Skin warm and dry Cardiac: RRR, no murmurs, no lower extremity edema Respiratory: Normal respiratory rate and rhythm, lungs clear without rales or wheezes Abdomen: Abdomen soft no tenderness to palpation, no ascites or distention, no hepatosplenomegaly  MSK:  Neuro: Awake and alert, moves all extremities with normal strength and coordination, speech fluent Psych: Responding normally to questions, after placement, judgment insight appear pleasantly confused     Data Reviewed: I have personally reviewed following labs and imaging studies:  CBC: Recent Labs  Lab 02/27/21 0524  WBC 9.2  NEUTROABS 4.4  HGB 13.3  HCT 40.9  MCV 98.6  PLT 209   Basic Metabolic Panel: Recent Labs  Lab 02/27/21 0524  NA 138  K 4.4  CL 103  CO2 28  GLUCOSE 99  BUN 22*  CREATININE 0.99  CALCIUM 9.9  MG 1.9  PHOS 4.7*   GFR: Estimated Creatinine Clearance: 80.3 mL/min (by C-G formula  based on SCr of 0.99 mg/dL). Liver Function Tests: No results for input(s): AST, ALT, ALKPHOS, BILITOT, PROT, ALBUMIN in the last 168 hours. No results for input(s): LIPASE, AMYLASE in the last 168 hours. No results for input(s): AMMONIA in the last 168 hours. Coagulation Profile: No results for input(s): INR, PROTIME in the last 168 hours. Cardiac Enzymes: No results for input(s): CKTOTAL, CKMB, CKMBINDEX, TROPONINI in the last 168 hours. BNP (last 3 results) No results for input(s): PROBNP in the last 8760 hours. HbA1C: No results for input(s): HGBA1C in the last 72 hours. CBG: No results for input(s): GLUCAP in the last 168 hours. Lipid Profile: No results for input(s): CHOL, HDL, LDLCALC, TRIG, CHOLHDL, LDLDIRECT in the last 72 hours. Thyroid Function Tests: No results for input(s): TSH, T4TOTAL, FREET4, T3FREE, THYROIDAB in the last 72 hours. Anemia Panel: No results for input(s): VITAMINB12, FOLATE, FERRITIN, TIBC, IRON, RETICCTPCT in the last 72 hours. Urine analysis:    Component Value Date/Time   COLORURINE YELLOW 12/28/2020 0000   APPEARANCEUR HAZY (A) 12/28/2020 0000   LABSPEC 1.025 12/28/2020 0000   PHURINE 5.0 12/28/2020 0000   GLUCOSEU NEGATIVE 12/28/2020 0000   HGBUR NEGATIVE 12/28/2020 0000   BILIRUBINUR NEGATIVE 12/28/2020 0000   KETONESUR NEGATIVE 12/28/2020 0000   PROTEINUR NEGATIVE 12/28/2020 0000   UROBILINOGEN 1.0 05/31/2014 1841   NITRITE NEGATIVE 12/28/2020 0000   LEUKOCYTESUR NEGATIVE 12/28/2020 0000   Sepsis Labs: @LABRCNTIP (procalcitonin:4,lacticacidven:4)  )No results found for this or any previous visit (from the past 240 hour(s)).       Radiology Studies: No results found.      Scheduled Meds: . carvedilol  6.25 mg Oral BID WC  . enoxaparin (LOVENOX) injection  40 mg Subcutaneous Q24H  . feeding supplement  237 mL Oral TID BM  . folic acid  1 mg Oral Daily  . lactulose  20 g Oral BID  . magnesium oxide  400 mg Oral BID  .  multivitamin with minerals  1 tablet Oral Daily  . QUEtiapine  25 mg Oral BID  . sodium chloride flush  3 mL Intravenous Q12H  . thiamine  100 mg Oral Daily  . vitamin B-12  1,000 mcg Oral Daily   Continuous Infusions:   LOS: 63 days    Time spent: 25 minutes    , MD [facility] 03/01/2021, 11:59 AM     Please page though AMION or Epic secure chat:  For 05/01/2021, [employer]

## 2021-03-02 NOTE — Progress Notes (Signed)
  Speech Language Pathology Treatment: Cognitive-Linquistic  Patient Details Name: Alvin Clark MRN: [id number] DOB: [birth year] Today's Date: 03/02/2021 Time: [id number] SLP Time Calculation (min) (ACUTE ONLY): 15 min  Assessment / Plan / Recommendation Clinical Impression  SlP follow up to provide pt with calendar , pen and glasses to help him keep track of days, happenings, important information. Pt did not recall that SLP was providing him with these items today but did look to white board for cues.      Using max verbal/visual cues- pt able to determine he has been in the hospital for 8 weeks and he was oriented to date using white board with mod I and calendar with moderate cues.  Pt recalled within five minutes that SLP was locating another pair of glasses for him from the office without cues  Cued pt to write in his calendar that he will receive glasses from SLP on Tuesday May 17th. And using calendar with moderate visual/verbal cues, pt able to relay plan.    He will require assistance from staff to use his white board and calendar for orientation - relayed information and requests to assist pt to RN, Alvin Clark.     SLP has provided pt with material for compensating for cognitive deficits- carryover from staff needed and SLP will sign off.  Thanks.      HPI HPI: 60 yo male adm 12/28/2020 with confusion, disorientation- he was trying to get back into his home after eviction.  Pt was found to be dehydrated with AKI and has been treated.   Brain imaging 3/10 Age advanced generalized cerebral volume loss, white matter changes.  Pt with h/o ETOH use with progressive cognitive decline.  Cognitive evaluation ordered - in particular SLUMS.      SLP Plan  Other (Comment) (compensations provided using teach back)       Recommendations                   Follow up Recommendations: None SLP Visit Diagnosis: Cognitive communication deficit (R41.841) Plan: Other (Comment) (compensations  provided using teach back)       GO               Alvin Infante, MS [facility] SLP Acute Rehab Services Office [phone] Pager [phone]   Alvin Clark 03/02/2021, 3:17 PM   Staff, please write date on white board to help pt stay oriented- and cue him to use his personal calendar for important information.  Thanks.

## 2021-03-02 NOTE — Plan of Care (Signed)
  Problem: Safety: Goal: Ability to remain free from injury will improve Outcome: Progressing   Problem: Skin Integrity: Goal: Risk for impaired skin integrity will decrease Outcome: Progressing   

## 2021-03-02 NOTE — Progress Notes (Signed)
[facility] PROGRESS NOTE    Alvin Clark  [id number] DOB: April 22, 1961 DOA: 12/27/2020   Subjective: No fever, respiratory distress, vomiting, bleeding.  Patient has no complaints.  Does not appear to be in pain.    Objective: BP 115/86 (BP Location: Right Arm)   Pulse 64   Temp 98.2 F (36.8 C) (Oral)   Resp 19   Ht 5\' 9"  (1.753 m)   Wt 72.4 kg   SpO2 95%   BMI 23.57 kg/m   General appearance: Adult male, lying in bed, watching television     HEENT:    Skin:  Cardiac: RRR, no murmurs, no lower extremity Respiratory: Respiratory rate and rhythm, lungs clear without rales or wheezes Abdomen: Soft without tenderness palpation or guarding MSK:   Neuro:   Extraocular movements intact, moves all extremities with normal strength and coordination, speech fluent Psych: Seems impaired, judgment and insight appear abnormal, attention normal    Assessment & Plan:  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   Difficult to place patient Yes       Level of care: Med-Surg      05-28-1997, MD [facility] 03/02/2021, 11:17 AM     Please page though AMION or Epic secure chat:  For 03/04/2021, [employer]

## 2021-03-02 NOTE — TOC Progression Note (Signed)
Transition of Care [facility]) - Progression Note    Patient Details  Name: Alvin Clark MRN: [id number] Date of Birth: [birth year]  Transition of Care [facility]) CM/SW Contact  Darleene Cleaver, [place] Phone Number: 03/02/2021, 4:55 PM  Clinical Narrative:     CSW faxed clinicals to [facility] to see if they will consider patient, awaiting decision from [facility] memory care.   Expected Discharge Plan: Assisted Living (versus family care home) Barriers to Discharge: Financial Resources,Homeless with medical needs,Continued Medical Work up,No SNF bed,Requiring sitter/restraints,SNF Pending payor source - LOG,SNF Pending Medicaid,Family Issues  Expected Discharge Plan and Services Expected Discharge Plan: Assisted Living (versus family care home) In-house Referral: Clinical Social Work,Financial Counselor     Living arrangements for the past 2 months: No permanent address                                       Social Determinants of Health (SDOH) Interventions    Readmission Risk Interventions No flowsheet data found.

## 2021-03-03 NOTE — Progress Notes (Signed)
[place] [facility] PROGRESS NOTE    Alvin Clark  [id number] DOB: 1961/05/25 DOA: 12/27/2020   Subjective: No new fever, headache, chest pain, dyspnea, abdominal pain.  No fever, respiratory distress, vomiting.    Objective: BP 122/83 (BP Location: Right Arm)   Pulse 89   Temp 98 F (36.7 C) (Oral)   Resp 16   Ht 5\' 9"  (1.753 m)   Wt 76.1 kg   SpO2 97%   BMI 24.78 kg/m   General appearance: Adult male, lying in bed, trying to change the channel with his telephone.    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   Dispo: The patient is from: Home              Anticipated d/c is to: Group home              Patient currently is medically stable to d/c.   Difficult to place patient Yes       Level of care: Med-Surg      05-28-1997, MD [facility] 03/03/2021, 1:28 PM     Please page though AMION or Epic secure chat:   For 03/05/2021, [employer]

## 2021-03-04 NOTE — Progress Notes (Signed)
[place] [facility] PROGRESS NOTE    Alvin Clark  [id number] DOB: 20-Dec-1960 DOA: 12/27/2020   Subjective: No new fever, respiratory distress, loss of consciousness, vomiting, diarrhea.  No headache, chest pain, dyspnea, abdominal pain.    Objective: BP 126/84 (BP Location: Right Arm)   Pulse 90   Temp 98.2 F (36.8 C) (Oral)   Resp 16   Ht 5\' 9"  (1.753 m)   Wt 78.6 kg   SpO2 96%   BMI 25.59 kg/m   General appearance: Adult male, lying in bed, no acute distress.    HEENT:    Skin:  Cardiac: RRR, no murmur Respiratory: Respiratory rate normal, lungs clear without rales or wheezes Abdomen:   MSK:   Neuro:     Psych:           Assessment & Plan:  Dementia SLUMS 14/30   -Continue thiamine, folate, Seroquel      Failure to thrive    - Continue nutritional supplements    Hypertension -Continue carvedilol        Disposition: Status is: Inpatient  Remains inpatient appropriate because:Unsafe d/c plan given the patients memory loss due to alcohol, he is not safe to be unsupervised (SLUMS here is 14/30, equivalent to a memory care patient with Alzheimer's), and alternative placement is still being sought.   Dispo: The patient is from: Home              Anticipated d/c is to: Group home              Patient currently is medically stable to d/c.   Difficult to place patient Yes       Level of care: Med-Surg      05-28-1997, MD [facility] 03/04/2021, 2:02 PM     Please page though AMION or Epic secure chat:  For 03/06/2021, [employer]

## 2021-03-05 NOTE — TOC Progression Note (Signed)
Transition of Care [facility]) - Progression Note    Patient Details  Name: Alvin Clark MRN: [id number] Date of Birth: Feb 10, 1961  Transition of Care [facility]) CM/SW Contact  Halford Chessman Phone Number: 03/05/2021, 5:43 PM  Clinical Narrative:    CSW spoke to Ms. Wallis Bamberg, director of group home. [phone] she requested clinical information be faxed to her to review, Fax sent to [phone].  Waiting for call back regarding patient being reviewed.   Expected Discharge Plan: Assisted Living (versus family care home) Barriers to Discharge: Financial Resources,Homeless with medical needs,Continued Medical Work up,No SNF bed,Requiring sitter/restraints,SNF Pending payor source - LOG,SNF Pending Medicaid,Family Issues  Expected Discharge Plan and Services Expected Discharge Plan: Assisted Living (versus family care home) In-house Referral: Clinical Social Work,Financial Counselor     Living arrangements for the past 2 months: No permanent address                                       Social Determinants of Health (SDOH) Interventions    Readmission Risk Interventions No flowsheet data found.

## 2021-03-05 NOTE — Progress Notes (Signed)
[facility] PROGRESS NOTE    Alvin Clark  [id number] DOB: 1960/10/27 DOA: 12/27/2020 PCP: Patient, No Pcp Per (Inactive)      Brief Narrative:  Alvin Clark is a 60 y.o. M with alcohol use presented with confusion.  Per admission report, patient brought in by [facility] PD after trying to re-enter his home after eviction and being found confused, frail and disoriented.  In the ER, patient tachycardic to 100s, BP 90-100s systolic, Cr 1.83 from baseline 0.8, AST/ALT ~100, ethanol negative, CT head with chronic atrophy, CXR clear.    Started on fluids and thiamine and admited for AKI and dehydration in setting of alcohol use.     Hospital course: 3/9: Admitted and started on fluids, thiamine 3/11: Dietitian consulted 3/15: Electrolytes resolved by this point 4/7: Transaminitis resolved by this point  Patient however, has advanced alcohol-related dementia (SLUMS 14/30 on 5/13) and is unable to safely care for self in homeless shelter.  Attempts at safe placement are ongoing.       Assessment & Plan:  Alcohol-related dementia Alcohol use disorder, severe, now in remission Acute metabolic encephalopathy ruled out.  Patient presented with some confusion, but this appears to be his current baseline due to progression of alcohol-related dementia.  Stable - Continue thiamine and folate -Continue Seroquel    Severe protein calorie malnutrition due to alcoholism Failure to thrive due to alcoholism Patient presented with electrolyte abnormalities, dehydration, and severe depletion of subcutaneous muscle mass and subcutaneous fat due to alcoholism - Continue nutritional supplements      Acute kidney injury Baseline Cr 0.9-1.0, presented with Cr 1.8, resolved to baseline with fluids  Hypertension BP controlled -Continue carvedilol   Alcoholic hepatitis Resolved to normal   Macrocytic anemia due to alcohol Macrocytosis and anemia  resolved -Continue B12 supplement until discharge       Disposition: Status is: Inpatient  Remains inpatient appropriate because:Unsafe d/c plan   Dispo: The patient is from: Home              Anticipated d/c is to: Group home              Patient currently is medically stable to d/c.   Difficult to place patient Yes   Patient was admitted with dehydration and malnutrition.  His nutritional status has been optimized to the best of her abilities, he was given fluids, but he has persistent cognitive impairment due to alcoholic dementia and is unsafe to live alone and certainly not in a homeless shelter.  Attempts at safe placement are ongoing.      Level of care: Med-Surg       MDM: The below labs and imaging reports were reviewed and summarized above.  Medication management as above.     DVT prophylaxis: enoxaparin (LOVENOX) injection 40 mg Start: 12/27/20 2200  Code Status: FULL Family Communication: None present             Subjective: No new fever, respiratory distress, headache, chest pain, abdominal pain, vomiting.     Objective: Vitals:   03/04/21 1407 03/04/21 2046 03/05/21 0453 03/05/21 0500  BP: 128/73 129/82 131/76   Pulse: 92 97 97   Resp: 17 16 16    Temp: 98.8 F (37.1 C) 98.4 F (36.9 C) 98.4 F (36.9 C)   TempSrc:  Oral Oral   SpO2: 97% 94% 97%   Weight:    81.6 kg  Height:        Intake/Output Summary (Last  24 hours) at 03/05/2021 1013 Last data filed at 03/04/2021 1900 Gross per 24 hour  Intake 540 ml  Output --  Net 540 ml   Filed Weights   03/03/21 0500 03/04/21 0500 03/05/21 0500  Weight: 76.1 kg 78.6 kg 81.6 kg    Examination: General appearance: Adult male, lying in bed, watching television, interactive     HEENT: Oropharynx moist, dentition normal Skin:  Cardiac: RRR, no murmurs, no lower extremity edema Respiratory: Normal respiratory rate and rhythm, lungs clear without rales or wheezes Abdomen: Abdomen  soft without tenderness palpation or guarding MSK:  Neuro:    Psych: Normal, affect pleasant, judgment insight appear moderately impaired          Data Reviewed: I have personally reviewed following labs and imaging studies:  CBC: Recent Labs  Lab 02/27/21 0524  WBC 9.2  NEUTROABS 4.4  HGB 13.3  HCT 40.9  MCV 98.6  PLT 209   Basic Metabolic Panel: Recent Labs  Lab 02/27/21 0524  NA 138  K 4.4  CL 103  CO2 28  GLUCOSE 99  BUN 22*  CREATININE 0.99  CALCIUM 9.9  MG 1.9  PHOS 4.7*   GFR: Estimated Creatinine Clearance: 80.3 mL/min (by C-G formula based on SCr of 0.99 mg/dL). Liver Function Tests: No results for input(s): AST, ALT, ALKPHOS, BILITOT, PROT, ALBUMIN in the last 168 hours. No results for input(s): LIPASE, AMYLASE in the last 168 hours. No results for input(s): AMMONIA in the last 168 hours. Coagulation Profile: No results for input(s): INR, PROTIME in the last 168 hours. Cardiac Enzymes: No results for input(s): CKTOTAL, CKMB, CKMBINDEX, TROPONINI in the last 168 hours. BNP (last 3 results) No results for input(s): PROBNP in the last 8760 hours. HbA1C: No results for input(s): HGBA1C in the last 72 hours. CBG: No results for input(s): GLUCAP in the last 168 hours. Lipid Profile: No results for input(s): CHOL, HDL, LDLCALC, TRIG, CHOLHDL, LDLDIRECT in the last 72 hours. Thyroid Function Tests: No results for input(s): TSH, T4TOTAL, FREET4, T3FREE, THYROIDAB in the last 72 hours. Anemia Panel: No results for input(s): VITAMINB12, FOLATE, FERRITIN, TIBC, IRON, RETICCTPCT in the last 72 hours. Urine analysis:    Component Value Date/Time   COLORURINE YELLOW 12/28/2020 0000   APPEARANCEUR HAZY (A) 12/28/2020 0000   LABSPEC 1.025 12/28/2020 0000   PHURINE 5.0 12/28/2020 0000   GLUCOSEU NEGATIVE 12/28/2020 0000   HGBUR NEGATIVE 12/28/2020 0000   BILIRUBINUR NEGATIVE 12/28/2020 0000   KETONESUR NEGATIVE 12/28/2020 0000   PROTEINUR NEGATIVE  12/28/2020 0000   UROBILINOGEN 1.0 05/31/2014 1841   NITRITE NEGATIVE 12/28/2020 0000   LEUKOCYTESUR NEGATIVE 12/28/2020 0000   Sepsis Labs: @LABRCNTIP (procalcitonin:4,lacticacidven:4)  )No results found for this or any previous visit (from the past 240 hour(s)).       Radiology Studies: No results found.      Scheduled Meds: . carvedilol  6.25 mg Oral BID WC  . enoxaparin (LOVENOX) injection  40 mg Subcutaneous Q24H  . feeding supplement  237 mL Oral TID BM  . folic acid  1 mg Oral Daily  . multivitamin with minerals  1 tablet Oral Daily  . QUEtiapine  25 mg Oral BID  . sodium chloride flush  3 mL Intravenous Q12H  . thiamine  100 mg Oral Daily  . vitamin B-12  1,000 mcg Oral Daily   Continuous Infusions:   LOS: 67 days    Time spent: 25 minutes    , MD [facility]  03/05/2021, 10:13 AM     Please page though AMION or Epic secure chat:  For [employer], Higher education careers adviser

## 2021-03-06 NOTE — Progress Notes (Signed)
[facility] PROGRESS NOTE    Alvin Clark  LZJ:[id number] DOB: 07-10-1961 DOA: 12/27/2020 PCP: Patient, No Pcp Per (Inactive)      Brief Narrative:  Alvin Clark is a 60 y.o. M with alcohol use presented with confusion.  Per admission report, patient brought in by [facility] PD after trying to re-enter his home after eviction and being found confused, frail and disoriented.  In the ER, patient tachycardic to 100s, BP 90-100s systolic, Cr 1.83 from baseline 0.8, AST/ALT ~100, ethanol negative, CT head with chronic atrophy, CXR clear.    Started on fluids and thiamine and admited for AKI and dehydration in setting of alcohol use.     Hospital course: 3/9: Admitted and started on fluids, thiamine 3/11: Dietitian consulted 3/15: Electrolytes resolved by this point 4/7: Transaminitis resolved by this point  Patient however, has advanced alcohol-related dementia (SLUMS 14/30 on 5/13) and is unable to safely care for self in homeless shelter.  Attempts at safe placement are ongoing.       Assessment & Plan:  Alcohol-related dementia Alcohol use disorder, severe, now in remission Acute metabolic encephalopathy ruled out.  Patient presented with some confusion, but this appears to be his current baseline due to progression of alcohol-related dementia.  Stable  - Continue thiamine and folate - Continue Seroquel    Severe protein calorie malnutrition due to alcoholism Failure to thrive due to alcoholism Patient presented with electrolyte abnormalities, dehydration, and severe depletion of subcutaneous muscle mass and subcutaneous fat due to alcoholism   - Continue nutritional supplements   Hypertension Blood pressure controlled - Continue carvedilol      Resolved issues:  Acute kidney injury Baseline Cr 0.9-1.0, presented with Cr 1.8, resolved to baseline with fluids  Alcoholic hepatitis Resolved to normal  Macrocytic anemia due to  alcohol Macrocytosis and anemia resolved -Continue B12 supplement until discharge       Disposition: Status is: Inpatient  Remains inpatient appropriate because:Unsafe d/c plan   Dispo: The patient is from: Home              Anticipated d/c is to: Group home              Patient currently is medically stable to d/c.   Difficult to place patient Yes   Patient was admitted with dehydration and malnutrition.  His nutritional status has been optimized to the best of her abilities, he was given fluids, but he has persistent cognitive impairment due to alcoholic dementia and is unsafe to live alone and certainly not in a homeless shelter.  Attempts at safe placement are ongoing.      Level of care: Med-Surg       MDM: The below labs and imaging reports were reviewed and summarized above.  Medication management as above.     DVT prophylaxis: enoxaparin (LOVENOX) injection 40 mg Start: 12/27/20 2200  Code Status: FULL Family Communication: None present             Subjective: No new fever, respiratory distress, vomiting, diarrhea per nursing.     Objective: Vitals:   03/05/21 1955 03/06/21 0419 03/06/21 0447 03/06/21 1404  BP: 124/85 (!) 132/92  (!) 144/95  Pulse: 93 91  92  Resp: 18 16  16   Temp: 98.5 F (36.9 C) 98 F (36.7 C)  98.2 F (36.8 C)  TempSrc:    Oral  SpO2: 94% 96%  98%  Weight:   79.8 kg   Height:  Intake/Output Summary (Last 24 hours) at 03/06/2021 1448 Last data filed at 03/05/2021 2100 Gross per 24 hour  Intake 200 ml  Output --  Net 200 ml   Filed Weights   03/04/21 0500 03/05/21 0500 03/06/21 0447  Weight: 78.6 kg 81.6 kg 79.8 kg    Examination: General appearance: Adult male, lying in bed, sleeping, comfortablly, seems chronically ill-appearing     HEENT:  Skin:  Cardiac:   Respiratory:  Abdomen:  MSK:  Neuro:    Psych:           Data Reviewed: I have personally reviewed following labs and  imaging studies:  CBC: No results for input(s): WBC, NEUTROABS, HGB, HCT, MCV, PLT in the last 168 hours. Basic Metabolic Panel: No results for input(s): NA, K, CL, CO2, GLUCOSE, BUN, CREATININE, CALCIUM, MG, PHOS in the last 168 hours. GFR: Estimated Creatinine Clearance: 80.3 mL/min (by C-G formula based on SCr of 0.99 mg/dL). Liver Function Tests: No results for input(s): AST, ALT, ALKPHOS, BILITOT, PROT, ALBUMIN in the last 168 hours. No results for input(s): LIPASE, AMYLASE in the last 168 hours. No results for input(s): AMMONIA in the last 168 hours. Coagulation Profile: No results for input(s): INR, PROTIME in the last 168 hours. Cardiac Enzymes: No results for input(s): CKTOTAL, CKMB, CKMBINDEX, TROPONINI in the last 168 hours. BNP (last 3 results) No results for input(s): PROBNP in the last 8760 hours. HbA1C: No results for input(s): HGBA1C in the last 72 hours. CBG: No results for input(s): GLUCAP in the last 168 hours. Lipid Profile: No results for input(s): CHOL, HDL, LDLCALC, TRIG, CHOLHDL, LDLDIRECT in the last 72 hours. Thyroid Function Tests: No results for input(s): TSH, T4TOTAL, FREET4, T3FREE, THYROIDAB in the last 72 hours. Anemia Panel: No results for input(s): VITAMINB12, FOLATE, FERRITIN, TIBC, IRON, RETICCTPCT in the last 72 hours. Urine analysis:    Component Value Date/Time   COLORURINE YELLOW 12/28/2020 0000   APPEARANCEUR HAZY (A) 12/28/2020 0000   LABSPEC 1.025 12/28/2020 0000   PHURINE 5.0 12/28/2020 0000   GLUCOSEU NEGATIVE 12/28/2020 0000   HGBUR NEGATIVE 12/28/2020 0000   BILIRUBINUR NEGATIVE 12/28/2020 0000   KETONESUR NEGATIVE 12/28/2020 0000   PROTEINUR NEGATIVE 12/28/2020 0000   UROBILINOGEN 1.0 05/31/2014 1841   NITRITE NEGATIVE 12/28/2020 0000   LEUKOCYTESUR NEGATIVE 12/28/2020 0000   Sepsis Labs: @LABRCNTIP (procalcitonin:4,lacticacidven:4)  )No results found for this or any previous visit (from the past 240 hour(s)).        Radiology Studies: No results found.      Scheduled Meds: . carvedilol  6.25 mg Oral BID WC  . enoxaparin (LOVENOX) injection  40 mg Subcutaneous Q24H  . feeding supplement  237 mL Oral TID BM  . folic acid  1 mg Oral Daily  . multivitamin with minerals  1 tablet Oral Daily  . QUEtiapine  25 mg Oral BID  . sodium chloride flush  3 mL Intravenous Q12H  . thiamine  100 mg Oral Daily  . vitamin B-12  1,000 mcg Oral Daily   Continuous Infusions:   LOS: 68 days    Time spent: 25 minutes    , MD [facility] 03/06/2021, 2:48 PM     Please page though AMION or Epic secure chat:  For 03/08/2021, [employer]

## 2021-03-06 NOTE — Plan of Care (Signed)
  Problem: Education: Goal: Knowledge of General Education information will improve Description: Including pain rating scale, medication(s)/side effects and non-pharmacologic comfort measures Outcome: Progressing   Problem: Health Behavior/Discharge Planning: Goal: Ability to manage health-related needs will improve Outcome: Progressing   Problem: Clinical Measurements: Goal: Ability to maintain clinical measurements within normal limits will improve Outcome: Progressing Goal: Will remain free from infection Outcome: Progressing Goal: Diagnostic test results will improve Outcome: Progressing Goal: Respiratory complications will improve Outcome: Progressing Goal: Cardiovascular complication will be avoided Outcome: Progressing   Problem: Activity: Goal: Risk for activity intolerance will decrease Outcome: Progressing   Problem: Nutrition: Goal: Adequate nutrition will be maintained Outcome: Progressing   Problem: Coping: Goal: Level of anxiety will decrease Outcome: Progressing   Problem: Elimination: Goal: Will not experience complications related to bowel motility Outcome: Progressing Goal: Will not experience complications related to urinary retention Outcome: Progressing   Problem: Pain Managment: Goal: General experience of comfort will improve Outcome: Progressing   Problem: Safety: Goal: Ability to remain free from injury will improve Outcome: Progressing   Problem: Skin Integrity: Goal: Risk for impaired skin integrity will decrease Outcome: Progressing   Problem: Education: Goal: Knowledge of disease and its progression will improve Outcome: Progressing Goal: Individualized Educational Video(s) Outcome: Progressing   Problem: Fluid Volume: Goal: Compliance with measures to maintain balanced fluid volume will improve Outcome: Progressing   Problem: Health Behavior/Discharge Planning: Goal: Ability to manage health-related needs will  improve Outcome: Progressing   Problem: Nutritional: Goal: Ability to make healthy dietary choices will improve Outcome: Progressing   Problem: Clinical Measurements: Goal: Complications related to the disease process, condition or treatment will be avoided or minimized Outcome: Progressing   Problem: Safety: Goal: Non-violent Restraint(s) Outcome: Progressing   

## 2021-03-07 NOTE — Plan of Care (Signed)
  Problem: Education: Goal: Knowledge of General Education information will improve Description: Including pain rating scale, medication(s)/side effects and non-pharmacologic comfort measures Outcome: Progressing   Problem: Health Behavior/Discharge Planning: Goal: Ability to manage health-related needs will improve Outcome: Progressing   Problem: Clinical Measurements: Goal: Ability to maintain clinical measurements within normal limits will improve Outcome: Progressing Goal: Will remain free from infection Outcome: Progressing Goal: Diagnostic test results will improve Outcome: Progressing Goal: Respiratory complications will improve Outcome: Progressing Goal: Cardiovascular complication will be avoided Outcome: Progressing   Problem: Activity: Goal: Risk for activity intolerance will decrease Outcome: Progressing   Problem: Nutrition: Goal: Adequate nutrition will be maintained Outcome: Progressing   Problem: Coping: Goal: Level of anxiety will decrease Outcome: Progressing   Problem: Elimination: Goal: Will not experience complications related to bowel motility Outcome: Progressing Goal: Will not experience complications related to urinary retention Outcome: Progressing   Problem: Pain Managment: Goal: General experience of comfort will improve Outcome: Progressing   Problem: Safety: Goal: Ability to remain free from injury will improve Outcome: Progressing   Problem: Skin Integrity: Goal: Risk for impaired skin integrity will decrease Outcome: Progressing   Problem: Education: Goal: Knowledge of disease and its progression will improve Outcome: Progressing Goal: Individualized Educational Video(s) Outcome: Progressing   Problem: Fluid Volume: Goal: Compliance with measures to maintain balanced fluid volume will improve Outcome: Progressing   Problem: Health Behavior/Discharge Planning: Goal: Ability to manage health-related needs will  improve Outcome: Progressing   Problem: Nutritional: Goal: Ability to make healthy dietary choices will improve Outcome: Progressing   Problem: Clinical Measurements: Goal: Complications related to the disease process, condition or treatment will be avoided or minimized Outcome: Progressing   Problem: Safety: Goal: Non-violent Restraint(s) Outcome: Progressing   

## 2021-03-07 NOTE — Progress Notes (Signed)
Patient ID: Alvin Clark, male   DOB: [birth year], 60 y.o.   MRN: [id number]  PROGRESS NOTE    Alvin Clark  TML:[id number] DOB: [birth year] DOA: 12/27/2020 PCP: Patient, No Pcp Per (Inactive)   Brief Narrative:  60 year old male with history of alcohol abuse presented with confusion after [facility] PD had to enter his home after eviction and he was found confused, frail and disoriented.  On presentation, he was tachycardic, slightly hypertensive with elevated LFTs.  CT of the head was negative for acute abnormality.  Chest x-ray was clear.  He was treated with IV fluids and thiamine.  He has had a long hospitalization because of unsafe discharge planning.  He is awaiting SNF placement.  He is currently medically stable for discharge.  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  Patient presented with some confusion, but this appears to be at his current baseline due to progression of alcohol-related dementia-currently stable. -Continue thiamine, multivitamin and folic acid along with vitamin B12  Severe protein calorie malnutrition due to alcoholism Failure to thrive -Continue nutritional supplements as per dietary recommendations  Hypertension -Blood pressure stable.  Continue Coreg  Acute kidney injury -Resolved.  Alcoholic hepatitis -Resolved  DVT prophylaxis: Lovenox Code Status: Full Family Communication: None at bedside Disposition Plan: Status is: Inpatient  Remains inpatient appropriate because:Unsafe d/c plan   Dispo: The patient is from: Home              Anticipated d/c is to: Group home              Patient currently is medically stable to d/c.   Difficult to place patient Yes    Consultants: None  Procedures: None  Antimicrobials: None   Subjective: Patient seen and examined at bedside.  Poor historian.  No overnight  fever, vomiting, worsening shortness of breath reported.  Objective: Vitals:   03/06/21 1404 03/06/21 2014 03/07/21 0439 03/07/21 0500  BP: (!) 144/95 (!) 142/88 127/79   Pulse: 92 86 97   Resp: 16 18 18    Temp: 98.2 F (36.8 C) (!) 97.5 F (36.4 C) 98.3 F (36.8 C)   TempSrc: Oral     SpO2: 98% 96% 96%   Weight:    77.9 kg  Height:       No intake or output data in the 24 hours ending 03/07/21 0908 Filed Weights   03/05/21 0500 03/06/21 0447 03/07/21 0500  Weight: 81.6 kg 79.8 kg 77.9 kg    Examination:  General exam: Appears calm and comfortable.  Chronically ill looking and looks older than stated age. Respiratory system: Bilateral decreased breath sounds at bases with some scattered crackles Cardiovascular system: S1 & S2 heard, Rate controlled Gastrointestinal system: Abdomen is nondistended, soft and nontender. Normal bowel sounds heard. Extremities: No cyanosis, clubbing, edema     Data Reviewed: I have personally reviewed following labs and imaging studies  CBC: No results for input(s): WBC, NEUTROABS, HGB, HCT, MCV, PLT in the last 168 hours. Basic Metabolic Panel: No results for input(s): NA, K, CL, CO2, GLUCOSE, BUN, CREATININE, CALCIUM, MG, PHOS in the last 168 hours. GFR: Estimated Creatinine Clearance: 80.3 mL/min (by C-G formula based on SCr of 0.99 mg/dL). Liver Function Tests: No results for input(s): AST, ALT, ALKPHOS, BILITOT, PROT, ALBUMIN in the last 168 hours. No results for input(s): LIPASE, AMYLASE in the last 168 hours. No results  for input(s): AMMONIA in the last 168 hours. Coagulation Profile: No results for input(s): INR, PROTIME in the last 168 hours. Cardiac Enzymes: No results for input(s): CKTOTAL, CKMB, CKMBINDEX, TROPONINI in the last 168 hours. BNP (last 3 results) No results for input(s): PROBNP in the last 8760 hours. HbA1C: No results for input(s): HGBA1C in the last 72 hours. CBG: No results for input(s): GLUCAP in the last  168 hours. Lipid Profile: No results for input(s): CHOL, HDL, LDLCALC, TRIG, CHOLHDL, LDLDIRECT in the last 72 hours. Thyroid Function Tests: No results for input(s): TSH, T4TOTAL, FREET4, T3FREE, THYROIDAB in the last 72 hours. Anemia Panel: No results for input(s): VITAMINB12, FOLATE, FERRITIN, TIBC, IRON, RETICCTPCT in the last 72 hours. Sepsis Labs: No results for input(s): PROCALCITON, LATICACIDVEN in the last 168 hours.  No results found for this or any previous visit (from the past 240 hour(s)).       Radiology Studies: No results found.      Scheduled Meds: . carvedilol  6.25 mg Oral BID WC  . enoxaparin (LOVENOX) injection  40 mg Subcutaneous Q24H  . feeding supplement  237 mL Oral TID BM  . folic acid  1 mg Oral Daily  . multivitamin with minerals  1 tablet Oral Daily  . QUEtiapine  25 mg Oral BID  . sodium chloride flush  3 mL Intravenous Q12H  . thiamine  100 mg Oral Daily  . vitamin B-12  1,000 mcg Oral Daily   Continuous Infusions:        Glade Lloyd, MD [facility] 03/07/2021, 9:08 AM

## 2021-03-07 NOTE — TOC Progression Note (Signed)
Transition of Care [facility]) - Progression Note    Patient Details  Name: Alvin Clark MRN: [id number] Date of Birth: 01/08/1961  Transition of Care [facility]) CM/SW Contact  Darleene Cleaver, [place] Phone Number: 03/07/2021, 6:28 PM  Clinical Narrative:     CSW received phone call back from [facility], they can not accept patient, because he is not at the minimum age of 43 and does not have a payor source.  CSW to continue to follow patient's progress throughout discharge planning.   Expected Discharge Plan: Assisted Living (versus family care home) Barriers to Discharge: Financial Resources,Homeless with medical needs,Continued Medical Work up,No SNF bed,Requiring sitter/restraints,SNF Pending payor source - LOG,SNF Pending Medicaid,Family Issues  Expected Discharge Plan and Services Expected Discharge Plan: Assisted Living (versus family care home) In-house Referral: Clinical Social Work,Financial Counselor     Living arrangements for the past 2 months: No permanent address                                       Social Determinants of Health (SDOH) Interventions    Readmission Risk Interventions No flowsheet data found.

## 2021-03-08 NOTE — Progress Notes (Signed)
Patient ID: Alvin Clark, male   DOB: October 07, 1961, 60 y.o.   MRN: [id number]  PROGRESS NOTE    YISROEL MULLENDORE  FIE:[id number] DOB: Dec 18, 1960 DOA: 12/27/2020 PCP: Patient, No Pcp Per (Inactive)   Brief Narrative:  60 year old male with history of alcohol abuse presented with confusion after [facility] PD had to enter his home after eviction and he was found confused, frail and disoriented.  On presentation, he was tachycardic, slightly hypertensive with elevated LFTs.  CT of the head was negative for acute abnormality.  Chest x-ray was clear.  He was treated with IV fluids and thiamine.  He has had a long hospitalization because of unsafe discharge planning.  He is awaiting SNF placement.  He is currently medically stable for discharge.  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  Patient presented with some confusion, but this appears to be at his current baseline due to progression of alcohol-related dementia-currently stable. -Continue thiamine, multivitamin and folic acid along with vitamin B12  Severe protein calorie malnutrition due to alcoholism Failure to thrive -Continue nutritional supplements as per dietary recommendations  Hypertension -Blood pressure stable.  Continue Coreg  Acute kidney injury -Resolved.  Alcoholic hepatitis -Resolved  DVT prophylaxis: Lovenox Code Status: Full Family Communication: None at bedside Disposition Plan: Status is: Inpatient  Remains inpatient appropriate because:Unsafe d/c plan   Dispo: The patient is from: Home              Anticipated d/c is to: Group home              Patient currently is medically stable to d/c.   Difficult to place patient Yes    Consultants: None  Procedures: None  Antimicrobials: None   Subjective: Patient seen and examined at bedside.  Poor historian.  No chest pain,  vomiting, fever reported. Objective: Vitals:   03/07/21 1258 03/07/21 2020 03/08/21 0418 03/08/21 0418  BP: 129/86 (!) 144/85  129/83  Pulse: 88 99  90  Resp: 14 17  18   Temp: 98.2 F (36.8 C) 98.4 F (36.9 C)  98.4 F (36.9 C)  TempSrc: Oral Oral    SpO2: 96% 96%  97%  Weight:   80.6 kg   Height:        Intake/Output Summary (Last 24 hours) at 03/08/2021 0735 Last data filed at 03/07/2021 0910 Gross per 24 hour  Intake 240 ml  Output --  Net 240 ml   Filed Weights   03/06/21 0447 03/07/21 0500 03/08/21 0418  Weight: 79.8 kg 77.9 kg 80.6 kg    Examination:  General exam: No acute distress.  Currently on room air.  Chronically ill looking and looks older than stated age. Respiratory system: Decreased breath sounds at bases bilaterally  cardiovascular system: Rate controlled, S1-S2 heard  gastrointestinal system: Abdomen is distended mildly, soft and nontender.  Bowel sounds heard  extremities: No clubbing or edema    Data Reviewed: I have personally reviewed following labs and imaging studies  CBC: No results for input(s): WBC, NEUTROABS, HGB, HCT, MCV, PLT in the last 168 hours. Basic Metabolic Panel: No results for input(s): NA, K, CL, CO2, GLUCOSE, BUN, CREATININE, CALCIUM, MG, PHOS in the last 168 hours. GFR: Estimated Creatinine Clearance: 80.3 mL/min (by C-G formula based on SCr of 0.99 mg/dL). Liver Function Tests: No results for input(s): AST, ALT, ALKPHOS, BILITOT, PROT, ALBUMIN in the last  168 hours. No results for input(s): LIPASE, AMYLASE in the last 168 hours. No results for input(s): AMMONIA in the last 168 hours. Coagulation Profile: No results for input(s): INR, PROTIME in the last 168 hours. Cardiac Enzymes: No results for input(s): CKTOTAL, CKMB, CKMBINDEX, TROPONINI in the last 168 hours. BNP (last 3 results) No results for input(s): PROBNP in the last 8760 hours. HbA1C: No results for input(s): HGBA1C in the last 72 hours. CBG: No results  for input(s): GLUCAP in the last 168 hours. Lipid Profile: No results for input(s): CHOL, HDL, LDLCALC, TRIG, CHOLHDL, LDLDIRECT in the last 72 hours. Thyroid Function Tests: No results for input(s): TSH, T4TOTAL, FREET4, T3FREE, THYROIDAB in the last 72 hours. Anemia Panel: No results for input(s): VITAMINB12, FOLATE, FERRITIN, TIBC, IRON, RETICCTPCT in the last 72 hours. Sepsis Labs: No results for input(s): PROCALCITON, LATICACIDVEN in the last 168 hours.  No results found for this or any previous visit (from the past 240 hour(s)).       Radiology Studies: No results found.      Scheduled Meds: . carvedilol  6.25 mg Oral BID WC  . enoxaparin (LOVENOX) injection  40 mg Subcutaneous Q24H  . feeding supplement  237 mL Oral TID BM  . folic acid  1 mg Oral Daily  . multivitamin with minerals  1 tablet Oral Daily  . QUEtiapine  25 mg Oral BID  . sodium chloride flush  3 mL Intravenous Q12H  . thiamine  100 mg Oral Daily  . vitamin B-12  1,000 mcg Oral Daily   Continuous Infusions:        Glade Lloyd, MD [facility] 03/08/2021, 7:35 AM

## 2021-03-08 NOTE — TOC Progression Note (Signed)
Transition of Care [facility]) - Progression Note    Patient Details  Name: Alvin Clark MRN: [id number] Date of Birth: 05-Oct-1961  Transition of Care [facility]) CM/SW Contact  Darleene Cleaver, [place] Phone Number: 03/08/2021, 4:53 PM  Clinical Narrative:     CSW attempted to contact [employer] to see if they help speed up the process for disability.  CSW had to leave a message on voice mail.  CSW also Chiropodist waiting for response back.   Expected Discharge Plan: Assisted Living (versus family care home) Barriers to Discharge: Financial Resources,Homeless with medical needs,Continued Medical Work up,No SNF bed,Requiring sitter/restraints,SNF Pending payor source - LOG,SNF Pending Medicaid,Family Issues  Expected Discharge Plan and Services Expected Discharge Plan: Assisted Living (versus family care home) In-house Referral: Clinical Social Work,Financial Counselor     Living arrangements for the past 2 months: No permanent address                                       Social Determinants of Health (SDOH) Interventions    Readmission Risk Interventions No flowsheet data found.

## 2021-03-09 NOTE — TOC Progression Note (Signed)
Transition of Care [facility]) - Progression Note    Patient Details  Name: [name] MRN: [id number] Date of Birth: 20-Dec-1960  Transition [facility] Care [facility]) CM/SW Contact  Darleene Cleaver, [place] Phone Number: 03/09/2021, 5:31 PM  Clinical Narrative:     CSW received a phone call back from Layla Barter at [facility], [phone] extension [phone]; Fax [phone] and she stated that she just received referral from [employer] on Tuesday to assist with helping with disability application.    Per Victorino Dike, they had not received a referral from [employer] until this week.  Victorino Dike also did not realize that patient has dementia, and she was planning to speak to him.  CSW informed her that patient has dementia, and his sister Octaviano Glow has been helping with decision making for patient.  Per Victorino Dike, she did not get that information in the referral.  CSW emailed her updated clinicals so she can follow up with patient and his sister.    CSW updated patient's sister that [facility] will be contacting her to help with setting up a phone appointment.    Victorino Dike informed CSW that she confirmed with patient's sister that she will be available for a phone appointment on Tuesday, May 24th at 10am.  CSW to continue to follow patient's progress throughout discharge planning.                                                                                                                                      Expected Discharge Plan: Assisted Living (versus family care home) Barriers to Discharge: Financial Resources,Homeless with medical needs,Continued Medical Work up,No SNF bed,Requiring sitter/restraints,SNF Pending payor source - LOG,SNF Pending Medicaid,Family Issues  Expected Discharge Plan and Services Expected Discharge Plan: Assisted Living (versus family care home) In-house Referral: Clinical Social Work,Financial Counselor     Living arrangements for the past 2 months: No permanent  address                                       Social Determinants of Health (SDOH) Interventions    Readmission Risk Interventions No flowsheet data found.

## 2021-03-09 NOTE — Progress Notes (Signed)
Patient ID: Alvin Clark, male   DOB: 1961/03/22, 60 y.o.   MRN: [id number]  PROGRESS NOTE    ASMAR BROZEK  OEU:[id number] DOB: 03/03/1961 DOA: 12/27/2020 PCP: Patient, No Pcp Per (Inactive)   Brief Narrative:  60 year old male with history of alcohol abuse presented with confusion after [facility] PD had to enter his home after eviction and he was found confused, frail and disoriented.  On presentation, he was tachycardic, slightly hypertensive with elevated LFTs.  CT of the head was negative for acute abnormality.  Chest x-ray was clear.  He was treated with IV fluids and thiamine.  He has had a long hospitalization because of unsafe discharge planning.  He is awaiting SNF placement.  He is currently medically stable for discharge.  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  Patient presented with some confusion, but this appears to be at his current baseline due to progression of alcohol-related dementia-currently stable. -Continue thiamine, multivitamin and folic acid along with vitamin B12  Severe protein calorie malnutrition due to alcoholism Failure to thrive -Continue nutritional supplements as per dietary recommendations  Hypertension -Blood pressure stable.  Continue Coreg  Acute kidney injury -Resolved.  Alcoholic hepatitis -Resolved  DVT prophylaxis: Lovenox Code Status: Full Family Communication: None at bedside Disposition Plan: Status is: Inpatient  Remains inpatient appropriate because:Unsafe d/c plan   Dispo: The patient is from: Home              Anticipated d/c is to: Group home              Patient currently is medically stable to d/c.   Difficult to place patient Yes    Consultants: None  Procedures: None  Antimicrobials: None   Subjective: Patient seen and examined at bedside.  Poor historian.  No agitation,  worsening shortness of breath, fever or vomiting reported.   Objective: Vitals:   03/08/21 0418 03/08/21 1332 03/08/21 2017 03/09/21 0510  BP: 129/83 133/74 131/76 135/85  Pulse: 90 (!) 104 90 98  Resp: 18 15 19 18   Temp: 98.4 F (36.9 C) 98.4 F (36.9 C) 98.9 F (37.2 C) 97.8 F (36.6 C)  TempSrc:  Oral    SpO2: 97% 98% 98% 93%  Weight:    78.4 kg  Height:        Intake/Output Summary (Last 24 hours) at 03/09/2021 0724 Last data filed at 03/08/2021 2200 Gross per 24 hour  Intake 476 ml  Output --  Net 476 ml   Filed Weights   03/07/21 0500 03/08/21 0418 03/09/21 0510  Weight: 77.9 kg 80.6 kg 78.4 kg    Examination:  General exam: On room air currently.  No distress.  Chronically ill looking and looks older than stated age. Respiratory system: Bilateral decreased breath sounds at bases cardiovascular system: S1-S2 heard, rate controlled gastrointestinal system: Abdomen is distended mildly, soft and nontender.  Normal bowel sounds are heard  extremities: Mild lower extremity edema; no cyanosis   Data Reviewed: I have personally reviewed following labs and imaging studies  CBC: No results for input(s): WBC, NEUTROABS, HGB, HCT, MCV, PLT in the last 168 hours. Basic Metabolic Panel: No results for input(s): NA, K, CL, CO2, GLUCOSE, BUN, CREATININE, CALCIUM, MG, PHOS in the last 168 hours. GFR: Estimated Creatinine Clearance: 80.3 mL/min (by C-G formula based on SCr of 0.99 mg/dL). Liver Function Tests: No results for input(s):  AST, ALT, ALKPHOS, BILITOT, PROT, ALBUMIN in the last 168 hours. No results for input(s): LIPASE, AMYLASE in the last 168 hours. No results for input(s): AMMONIA in the last 168 hours. Coagulation Profile: No results for input(s): INR, PROTIME in the last 168 hours. Cardiac Enzymes: No results for input(s): CKTOTAL, CKMB, CKMBINDEX, TROPONINI in the last 168 hours. BNP (last 3 results) No results for input(s): PROBNP in the last 8760  hours. HbA1C: No results for input(s): HGBA1C in the last 72 hours. CBG: No results for input(s): GLUCAP in the last 168 hours. Lipid Profile: No results for input(s): CHOL, HDL, LDLCALC, TRIG, CHOLHDL, LDLDIRECT in the last 72 hours. Thyroid Function Tests: No results for input(s): TSH, T4TOTAL, FREET4, T3FREE, THYROIDAB in the last 72 hours. Anemia Panel: No results for input(s): VITAMINB12, FOLATE, FERRITIN, TIBC, IRON, RETICCTPCT in the last 72 hours. Sepsis Labs: No results for input(s): PROCALCITON, LATICACIDVEN in the last 168 hours.  No results found for this or any previous visit (from the past 240 hour(s)).       Radiology Studies: No results found.      Scheduled Meds: . carvedilol  6.25 mg Oral BID WC  . enoxaparin (LOVENOX) injection  40 mg Subcutaneous Q24H  . feeding supplement  237 mL Oral TID BM  . folic acid  1 mg Oral Daily  . multivitamin with minerals  1 tablet Oral Daily  . QUEtiapine  25 mg Oral BID  . sodium chloride flush  3 mL Intravenous Q12H  . thiamine  100 mg Oral Daily  . vitamin B-12  1,000 mcg Oral Daily   Continuous Infusions:        Glade Lloyd, MD [facility] 03/09/2021, 7:24 AM

## 2021-03-10 NOTE — Progress Notes (Signed)
Patient ID: Alvin Clark, male   DOB: [birth year], 60 y.o.   MRN: [id number]  PROGRESS NOTE    Alvin Clark  YCX:[id number] DOB: 07/21/1961 DOA: 12/27/2020 PCP: Patient, No Pcp Per (Inactive)   Brief Narrative:  60 year old male with history of alcohol abuse presented with confusion after [facility] PD had to enter his home after eviction and he was found confused, frail and disoriented.  On presentation, he was tachycardic, slightly hypertensive with elevated LFTs.  CT of the head was negative for acute abnormality.  Chest x-ray was clear.  He was treated with IV fluids and thiamine.  He has had a long hospitalization because of unsafe discharge planning.  He is awaiting SNF placement.  He is currently medically stable for discharge.  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  Patient presented with some confusion, but this appears to be at his current baseline due to progression of alcohol-related dementia-currently stable. -Continue thiamine, multivitamin and folic acid along with vitamin B12  Severe protein calorie malnutrition due to alcoholism Failure to thrive -Continue nutritional supplements as per dietary recommendations  Hypertension -Blood pressure stable.  Continue Coreg  Acute kidney injury -Resolved.  Alcoholic hepatitis -Resolved  DVT prophylaxis: Lovenox Code Status: Full Family Communication: None at bedside Disposition Plan: Status is: Inpatient  Remains inpatient appropriate because:Unsafe d/c plan   Dispo: The patient is from: Home              Anticipated d/c is to: Group home              Patient currently is medically stable to d/c.   Difficult to place patient Yes    Consultants: None  Procedures: None  Antimicrobials: None   Subjective: Patient seen and examined at bedside.  Poor historian.  No fever, vomiting  or agitation reported by nursing staff.   Objective: Vitals:   03/08/21 2017 03/09/21 0510 03/09/21 1404 03/09/21 2049  BP: 131/76 135/85 131/80 131/82  Pulse: 90 98 91 91  Resp: 19 18 17 16   Temp: 98.9 F (37.2 C) 97.8 F (36.6 C) 98.6 F (37 C) 98.4 F (36.9 C)  TempSrc:    Oral  SpO2: 98% 93% 98% 96%  Weight:  78.4 kg    Height:        Intake/Output Summary (Last 24 hours) at 03/10/2021 0801 Last data filed at 03/10/2021 0600 Gross per 24 hour  Intake 1182 ml  Output 0 ml  Net 1182 ml   Filed Weights   03/07/21 0500 03/08/21 0418 03/09/21 0510  Weight: 77.9 kg 80.6 kg 78.4 kg    Examination:  General exam: No acute distress.  Currently on room air.  Chronically ill looking and looks older than stated age. Respiratory system: Decreased bilateral breath sounds at bases cardiovascular system: Rate controlled, S1-2 heard  gastrointestinal system: Abdomen is mildly distended, soft and nontender.  Bowel sounds are heard  extremities: No clubbing; trace lower extremity edema present   Data Reviewed: I have personally reviewed following labs and imaging studies  CBC: No results for input(s): WBC, NEUTROABS, HGB, HCT, MCV, PLT in the last 168 hours. Basic Metabolic Panel: No results for input(s): NA, K, CL, CO2, GLUCOSE, BUN, CREATININE, CALCIUM, MG, PHOS in the last 168 hours. GFR: Estimated Creatinine Clearance: 80.3 mL/min (by C-G formula based on SCr of 0.99 mg/dL). Liver Function Tests: No results for  input(s): AST, ALT, ALKPHOS, BILITOT, PROT, ALBUMIN in the last 168 hours. No results for input(s): LIPASE, AMYLASE in the last 168 hours. No results for input(s): AMMONIA in the last 168 hours. Coagulation Profile: No results for input(s): INR, PROTIME in the last 168 hours. Cardiac Enzymes: No results for input(s): CKTOTAL, CKMB, CKMBINDEX, TROPONINI in the last 168 hours. BNP (last 3 results) No results for input(s): PROBNP in the last 8760 hours. HbA1C: No  results for input(s): HGBA1C in the last 72 hours. CBG: No results for input(s): GLUCAP in the last 168 hours. Lipid Profile: No results for input(s): CHOL, HDL, LDLCALC, TRIG, CHOLHDL, LDLDIRECT in the last 72 hours. Thyroid Function Tests: No results for input(s): TSH, T4TOTAL, FREET4, T3FREE, THYROIDAB in the last 72 hours. Anemia Panel: No results for input(s): VITAMINB12, FOLATE, FERRITIN, TIBC, IRON, RETICCTPCT in the last 72 hours. Sepsis Labs: No results for input(s): PROCALCITON, LATICACIDVEN in the last 168 hours.  No results found for this or any previous visit (from the past 240 hour(s)).       Radiology Studies: No results found.      Scheduled Meds: . carvedilol  6.25 mg Oral BID WC  . enoxaparin (LOVENOX) injection  40 mg Subcutaneous Q24H  . feeding supplement  237 mL Oral TID BM  . folic acid  1 mg Oral Daily  . multivitamin with minerals  1 tablet Oral Daily  . QUEtiapine  25 mg Oral BID  . sodium chloride flush  3 mL Intravenous Q12H  . thiamine  100 mg Oral Daily  . vitamin B-12  1,000 mcg Oral Daily   Continuous Infusions:        Glade Lloyd, MD [facility] 03/10/2021, 8:01 AM

## 2021-03-11 NOTE — Progress Notes (Signed)
Patient ID: Alvin Clark, male   DOB: Mar 31, 1961, 60 y.o.   MRN: [id number]  PROGRESS NOTE    PONCIANO SHEALY  DPO:[id number] DOB: [birth year] DOA: 12/27/2020 PCP: Patient, No Pcp Per (Inactive)   Brief Narrative:  60 year old male with history of alcohol abuse presented with confusion after [facility] PD had to enter his home after eviction and he was found confused, frail and disoriented.  On presentation, he was tachycardic, slightly hypertensive with elevated LFTs.  CT of the head was negative for acute abnormality.  Chest x-ray was clear.  He was treated with IV fluids and thiamine.  He has had a long hospitalization because of unsafe discharge planning.  He is awaiting SNF placement.  He is currently medically stable for discharge.  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  Patient presented with some confusion, but this appears to be at his current baseline due to progression of alcohol-related dementia-currently stable. -Continue thiamine, multivitamin and folic acid along with vitamin B12  Severe protein calorie malnutrition due to alcoholism Failure to thrive -Continue nutritional supplements as per dietary recommendations  Hypertension -Blood pressure stable.  Continue Coreg  Acute kidney injury -Resolved.  Alcoholic hepatitis -Resolved  DVT prophylaxis: Lovenox Code Status: Full Family Communication: None at bedside Disposition Plan: Status is: Inpatient  Remains inpatient appropriate because:Unsafe d/c plan   Dispo: The patient is from: Home              Anticipated d/c is to: Group home              Patient currently is medically stable to d/c.   Difficult to place patient Yes    Consultants: None  Procedures: None  Antimicrobials: None   Subjective: Patient seen and examined at bedside.  Poor historian.  No new complaints.   No agitation or fever reported.   Objective: Vitals:   03/10/21 1344 03/10/21 2054 03/11/21 0537 03/11/21 0544  BP: 136/81 132/90 125/77   Pulse: 96 95 (!) 101   Resp: 17 19 17    Temp: 98.6 F (37 C) 98.3 F (36.8 C) 98.4 F (36.9 C)   TempSrc:  Oral    SpO2: 97% 97% 94%   Weight:   78.4 kg 74.2 kg  Height:        Intake/Output Summary (Last 24 hours) at 03/11/2021 0744 Last data filed at 03/10/2021 1900 Gross per 24 hour  Intake 540 ml  Output --  Net 540 ml   Filed Weights   03/09/21 0510 03/11/21 0537 03/11/21 0544  Weight: 78.4 kg 78.4 kg 74.2 kg    Examination:  General exam: No distress.  On room air currently.  Chronically ill looking and looks older than stated age. Respiratory system: Bilateral decreased breath sounds at bases  cardiovascular system: S1-S2 heard, intermittently tachycardic  gastrointestinal system: Abdomen is mildly distended, soft and nontender.  Bowel sounds are heard  extremities: No clubbing; trace lower extremity edema present   Data Reviewed: I have personally reviewed following labs and imaging studies  CBC: No results for input(s): WBC, NEUTROABS, HGB, HCT, MCV, PLT in the last 168 hours. Basic Metabolic Panel: No results for input(s): NA, K, CL, CO2, GLUCOSE, BUN, CREATININE, CALCIUM, MG, PHOS in the last 168 hours. GFR: Estimated Creatinine Clearance: 80.3 mL/min (by C-G formula based on SCr of 0.99 mg/dL). Liver Function Tests: No results for input(s): AST,  ALT, ALKPHOS, BILITOT, PROT, ALBUMIN in the last 168 hours. No results for input(s): LIPASE, AMYLASE in the last 168 hours. No results for input(s): AMMONIA in the last 168 hours. Coagulation Profile: No results for input(s): INR, PROTIME in the last 168 hours. Cardiac Enzymes: No results for input(s): CKTOTAL, CKMB, CKMBINDEX, TROPONINI in the last 168 hours. BNP (last 3 results) No results for input(s): PROBNP in the last 8760 hours. HbA1C: No results for input(s):  HGBA1C in the last 72 hours. CBG: No results for input(s): GLUCAP in the last 168 hours. Lipid Profile: No results for input(s): CHOL, HDL, LDLCALC, TRIG, CHOLHDL, LDLDIRECT in the last 72 hours. Thyroid Function Tests: No results for input(s): TSH, T4TOTAL, FREET4, T3FREE, THYROIDAB in the last 72 hours. Anemia Panel: No results for input(s): VITAMINB12, FOLATE, FERRITIN, TIBC, IRON, RETICCTPCT in the last 72 hours. Sepsis Labs: No results for input(s): PROCALCITON, LATICACIDVEN in the last 168 hours.  No results found for this or any previous visit (from the past 240 hour(s)).       Radiology Studies: No results found.      Scheduled Meds: . carvedilol  6.25 mg Oral BID WC  . enoxaparin (LOVENOX) injection  40 mg Subcutaneous Q24H  . feeding supplement  237 mL Oral TID BM  . folic acid  1 mg Oral Daily  . multivitamin with minerals  1 tablet Oral Daily  . QUEtiapine  25 mg Oral BID  . sodium chloride flush  3 mL Intravenous Q12H  . thiamine  100 mg Oral Daily  . vitamin B-12  1,000 mcg Oral Daily   Continuous Infusions:        Glade Lloyd, MD [facility] 03/11/2021, 7:44 AM

## 2021-03-12 NOTE — Progress Notes (Signed)
Patient ID: Alvin Clark, male   DOB: Nov 29, 1960, 60 y.o.   MRN: [id number]  PROGRESS NOTE    ADOLPH CLUTTER  NWG:[id number] DOB: 01-Dec-1960 DOA: 12/27/2020 PCP: Patient, No Pcp Per (Inactive)   Brief Narrative:  60 year old male with history of alcohol abuse presented with confusion after [facility] PD had to enter his home after eviction and he was found confused, frail and disoriented.  On presentation, he was tachycardic, slightly hypertensive with elevated LFTs.  CT of the head was negative for acute abnormality.  Chest x-ray was clear.  He was treated with IV fluids and thiamine.  He has had a long hospitalization because of unsafe discharge planning.  He is awaiting SNF placement.  He is currently medically stable for discharge.  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  Patient presented with some confusion, but this appears to be at his current baseline due to progression of alcohol-related dementia-currently stable. -Continue thiamine, multivitamin and folic acid along with vitamin B12  Severe protein calorie malnutrition due to alcoholism Failure to thrive -Continue nutritional supplements as per dietary recommendations  Hypertension -Blood pressure stable.  Continue Coreg  Acute kidney injury -Resolved.  Alcoholic hepatitis -Resolved  DVT prophylaxis: Lovenox Code Status: Full Family Communication: None at bedside Disposition Plan: Status is: Inpatient  Remains inpatient appropriate because:Unsafe d/c plan   Dispo: The patient is from: Home              Anticipated d/c is to: Group home              Patient currently is medically stable to d/c.   Difficult to place patient Yes    Consultants: None  Procedures: None  Antimicrobials: None   Subjective: Patient seen and examined at bedside.  Poor historian.  No overnight new  complaints.  No fever or agitation reported.   Objective: Vitals:   03/11/21 1400 03/11/21 2010 03/12/21 0458 03/12/21 0500  BP: 123/80 121/86 116/84   Pulse: 95 97 97   Resp: 18 19 18    Temp: 98.5 F (36.9 C) 98 F (36.7 C) 97.8 F (36.6 C)   TempSrc:  Oral Oral   SpO2: 97% 97% 100%   Weight:    80.1 kg  Height:        Intake/Output Summary (Last 24 hours) at 03/12/2021 0734 Last data filed at 03/11/2021 2200 Gross per 24 hour  Intake 1217 ml  Output --  Net 1217 ml   Filed Weights   03/11/21 0537 03/11/21 0544 03/12/21 0500  Weight: 78.4 kg 74.2 kg 80.1 kg    Examination:  General exam: Currently on room air.  No acute distress.  Chronically ill looking and looks older than stated age. Respiratory system: Bilateral decreased breath sounds at bases  cardiovascular system: S1-S2 heard, intermittently tachycardic  gastrointestinal system: Abdomen is mildly distended, soft and nontender.  Bowel sounds are heard  extremities: No clubbing; trace lower extremity edema present   Data Reviewed: I have personally reviewed following labs and imaging studies  CBC: No results for input(s): WBC, NEUTROABS, HGB, HCT, MCV, PLT in the last 168 hours. Basic Metabolic Panel: No results for input(s): NA, K, CL, CO2, GLUCOSE, BUN, CREATININE, CALCIUM, MG, PHOS in the last 168 hours. GFR: Estimated Creatinine Clearance: 80.3 mL/min (by C-G formula based on SCr of 0.99 mg/dL). Liver Function Tests: No results for input(s): AST,  ALT, ALKPHOS, BILITOT, PROT, ALBUMIN in the last 168 hours. No results for input(s): LIPASE, AMYLASE in the last 168 hours. No results for input(s): AMMONIA in the last 168 hours. Coagulation Profile: No results for input(s): INR, PROTIME in the last 168 hours. Cardiac Enzymes: No results for input(s): CKTOTAL, CKMB, CKMBINDEX, TROPONINI in the last 168 hours. BNP (last 3 results) No results for input(s): PROBNP in the last 8760 hours. HbA1C: No results for  input(s): HGBA1C in the last 72 hours. CBG: No results for input(s): GLUCAP in the last 168 hours. Lipid Profile: No results for input(s): CHOL, HDL, LDLCALC, TRIG, CHOLHDL, LDLDIRECT in the last 72 hours. Thyroid Function Tests: No results for input(s): TSH, T4TOTAL, FREET4, T3FREE, THYROIDAB in the last 72 hours. Anemia Panel: No results for input(s): VITAMINB12, FOLATE, FERRITIN, TIBC, IRON, RETICCTPCT in the last 72 hours. Sepsis Labs: No results for input(s): PROCALCITON, LATICACIDVEN in the last 168 hours.  No results found for this or any previous visit (from the past 240 hour(s)).       Radiology Studies: No results found.      Scheduled Meds: . carvedilol  6.25 mg Oral BID WC  . enoxaparin (LOVENOX) injection  40 mg Subcutaneous Q24H  . feeding supplement  237 mL Oral TID BM  . folic acid  1 mg Oral Daily  . multivitamin with minerals  1 tablet Oral Daily  . QUEtiapine  25 mg Oral BID  . sodium chloride flush  3 mL Intravenous Q12H  . thiamine  100 mg Oral Daily  . vitamin B-12  1,000 mcg Oral Daily   Continuous Infusions:        Glade Lloyd, MD [facility] 03/12/2021, 7:34 AM

## 2021-03-12 NOTE — Plan of Care (Signed)
  Problem: Education: Goal: Knowledge of General Education information will improve Description: Including pain rating scale, medication(s)/side effects and non-pharmacologic comfort measures Outcome: Progressing   Problem: Health Behavior/Discharge Planning: Goal: Ability to manage health-related needs will improve Outcome: Progressing   Problem: Clinical Measurements: Goal: Ability to maintain clinical measurements within normal limits will improve Outcome: Progressing Goal: Will remain free from infection Outcome: Progressing Goal: Diagnostic test results will improve Outcome: Progressing Goal: Respiratory complications will improve Outcome: Progressing Goal: Cardiovascular complication will be avoided Outcome: Progressing   Problem: Activity: Goal: Risk for activity intolerance will decrease Outcome: Progressing   Problem: Nutrition: Goal: Adequate nutrition will be maintained Outcome: Progressing   Problem: Coping: Goal: Level of anxiety will decrease Outcome: Progressing   Problem: Elimination: Goal: Will not experience complications related to bowel motility Outcome: Progressing Goal: Will not experience complications related to urinary retention Outcome: Progressing   Problem: Pain Managment: Goal: General experience of comfort will improve Outcome: Progressing   Problem: Safety: Goal: Ability to remain free from injury will improve Outcome: Progressing   Problem: Skin Integrity: Goal: Risk for impaired skin integrity will decrease Outcome: Progressing   Problem: Education: Goal: Knowledge of disease and its progression will improve Outcome: Progressing Goal: Individualized Educational Video(s) Outcome: Progressing   Problem: Fluid Volume: Goal: Compliance with measures to maintain balanced fluid volume will improve Outcome: Progressing   Problem: Health Behavior/Discharge Planning: Goal: Ability to manage health-related needs will  improve Outcome: Progressing   Problem: Nutritional: Goal: Ability to make healthy dietary choices will improve Outcome: Progressing   Problem: Clinical Measurements: Goal: Complications related to the disease process, condition or treatment will be avoided or minimized Outcome: Progressing   Problem: Safety: Goal: Non-violent Restraint(s) Outcome: Progressing   

## 2021-03-13 NOTE — Progress Notes (Signed)
Patient ID: Alvin Clark, male   DOB: Sep 10, 1961, 60 y.o.   MRN: [id number]  PROGRESS NOTE    Alvin Clark  [id number] DOB: [birth year] DOA: 12/27/2020 PCP: Patient, No Pcp Per (Inactive)   Brief Narrative:  60 year old male with history of alcohol abuse presented with confusion after [facility] PD had to enter his home after eviction and he was found confused, frail and disoriented.  On presentation, he was tachycardic, slightly hypertensive with elevated LFTs.  CT of the head was negative for acute abnormality.  Chest x-ray was clear.  He was treated with IV fluids and thiamine.  He has had a long hospitalization because of unsafe discharge planning.  He is awaiting SNF placement.  He is currently medically stable for discharge.  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  Patient presented with some confusion, but this appears to be at his current baseline due to progression of alcohol-related dementia-currently stable. -Continue thiamine, multivitamin and folic acid along with vitamin B12  Severe protein calorie malnutrition due to alcoholism Failure to thrive -Continue nutritional supplements as per dietary recommendations  Hypertension -Blood pressure stable.  Continue Coreg  Acute kidney injury -Resolved.  Alcoholic hepatitis -Resolved  DVT prophylaxis: Lovenox Code Status: Full Family Communication: None at bedside Disposition Plan: Status is: Inpatient  Remains inpatient appropriate because:Unsafe d/c plan   Dispo: The patient is from: Home              Anticipated d/c is to: Group home              Patient currently is medically stable to d/c.   Difficult to place patient Yes    Consultants: None  Procedures: None  Antimicrobials: None   Subjective: Patient seen and examined at bedside.  Poor historian.  No new complaints  reported.  No vomiting or fever.   Objective: Vitals:   03/12/21 1300 03/12/21 2038 03/13/21 0500 03/13/21 0605  BP: 124/70 135/83  116/79  Pulse: 97 89  88  Resp: 16 18  18   Temp: 97.8 F (36.6 C) 98.1 F (36.7 C)  98.4 F (36.9 C)  TempSrc: Oral Oral  Oral  SpO2: 97% 95%  96%  Weight:   80.5 kg   Height:        Intake/Output Summary (Last 24 hours) at 03/13/2021 0740 Last data filed at 03/13/2021 0600 Gross per 24 hour  Intake 720 ml  Output --  Net 720 ml   Filed Weights   03/11/21 0544 03/12/21 0500 03/13/21 0500  Weight: 74.2 kg 80.1 kg 80.5 kg    Examination:  General exam: No distress.  On room air currently.  Chronically ill looking and looks older than stated age. Respiratory system: Bilateral decreased breath sounds at bases  cardiovascular system: S1-S2 heard, intermittently tachycardic  gastrointestinal system: Abdomen is mildly distended, soft and nontender.  Bowel sounds are heard  extremities: No clubbing; trace lower extremity edema present   Data Reviewed: I have personally reviewed following labs and imaging studies  CBC: No results for input(s): WBC, NEUTROABS, HGB, HCT, MCV, PLT in the last 168 hours. Basic Metabolic Panel: No results for input(s): NA, K, CL, CO2, GLUCOSE, BUN, CREATININE, CALCIUM, MG, PHOS in the last 168 hours. GFR: Estimated Creatinine Clearance: 80.3 mL/min (by C-G formula based on SCr of 0.99 mg/dL). Liver Function Tests: No results for input(s): AST, ALT, ALKPHOS,  BILITOT, PROT, ALBUMIN in the last 168 hours. No results for input(s): LIPASE, AMYLASE in the last 168 hours. No results for input(s): AMMONIA in the last 168 hours. Coagulation Profile: No results for input(s): INR, PROTIME in the last 168 hours. Cardiac Enzymes: No results for input(s): CKTOTAL, CKMB, CKMBINDEX, TROPONINI in the last 168 hours. BNP (last 3 results) No results for input(s): PROBNP in the last 8760 hours. HbA1C: No results for input(s): HGBA1C  in the last 72 hours. CBG: No results for input(s): GLUCAP in the last 168 hours. Lipid Profile: No results for input(s): CHOL, HDL, LDLCALC, TRIG, CHOLHDL, LDLDIRECT in the last 72 hours. Thyroid Function Tests: No results for input(s): TSH, T4TOTAL, FREET4, T3FREE, THYROIDAB in the last 72 hours. Anemia Panel: No results for input(s): VITAMINB12, FOLATE, FERRITIN, TIBC, IRON, RETICCTPCT in the last 72 hours. Sepsis Labs: No results for input(s): PROCALCITON, LATICACIDVEN in the last 168 hours.  No results found for this or any previous visit (from the past 240 hour(s)).       Radiology Studies: No results found.      Scheduled Meds: . carvedilol  6.25 mg Oral BID WC  . enoxaparin (LOVENOX) injection  40 mg Subcutaneous Q24H  . feeding supplement  237 mL Oral TID BM  . folic acid  1 mg Oral Daily  . multivitamin with minerals  1 tablet Oral Daily  . QUEtiapine  25 mg Oral BID  . sodium chloride flush  3 mL Intravenous Q12H  . thiamine  100 mg Oral Daily  . vitamin B-12  1,000 mcg Oral Daily   Continuous Infusions:        Glade Lloyd, MD [facility] 03/13/2021, 7:40 AM

## 2021-03-13 NOTE — Plan of Care (Signed)
  Problem: Education: Goal: Knowledge of General Education information will improve Description: Including pain rating scale, medication(s)/side effects and non-pharmacologic comfort measures Outcome: Progressing   Problem: Health Behavior/Discharge Planning: Goal: Ability to manage health-related needs will improve Outcome: Progressing   Problem: Clinical Measurements: Goal: Ability to maintain clinical measurements within normal limits will improve Outcome: Progressing Goal: Will remain free from infection Outcome: Progressing Goal: Diagnostic test results will improve Outcome: Progressing Goal: Respiratory complications will improve Outcome: Progressing Goal: Cardiovascular complication will be avoided Outcome: Progressing   Problem: Activity: Goal: Risk for activity intolerance will decrease Outcome: Progressing   Problem: Nutrition: Goal: Adequate nutrition will be maintained Outcome: Progressing   Problem: Coping: Goal: Level of anxiety will decrease Outcome: Progressing   Problem: Elimination: Goal: Will not experience complications related to bowel motility Outcome: Progressing Goal: Will not experience complications related to urinary retention Outcome: Progressing   Problem: Pain Managment: Goal: General experience of comfort will improve Outcome: Progressing   Problem: Safety: Goal: Ability to remain free from injury will improve Outcome: Progressing   Problem: Skin Integrity: Goal: Risk for impaired skin integrity will decrease Outcome: Progressing   Problem: Education: Goal: Knowledge of disease and its progression will improve Outcome: Progressing Goal: Individualized Educational Video(s) Outcome: Progressing   Problem: Fluid Volume: Goal: Compliance with measures to maintain balanced fluid volume will improve Outcome: Progressing   Problem: Health Behavior/Discharge Planning: Goal: Ability to manage health-related needs will  improve Outcome: Progressing   Problem: Nutritional: Goal: Ability to make healthy dietary choices will improve Outcome: Progressing   Problem: Clinical Measurements: Goal: Complications related to the disease process, condition or treatment will be avoided or minimized Outcome: Progressing   Problem: Safety: Goal: Non-violent Restraint(s) Outcome: Progressing   

## 2021-03-14 NOTE — Progress Notes (Signed)
Patient ID: Alvin Clark, male   DOB: Oct 28, 1960, 60 y.o.   MRN: [id number]  PROGRESS NOTE    Alvin Clark  RFF:[id number] DOB: 1960/11/24 DOA: 12/27/2020 PCP: Patient, No Pcp Per (Inactive)   Brief Narrative:  60 year old male with history of alcohol abuse presented with confusion after [facility] PD had to enter his home after eviction and he was found confused, frail and disoriented.  On presentation, he was tachycardic, slightly hypertensive with elevated LFTs.  CT of the head was negative for acute abnormality.  Chest x-ray was clear.  He was treated with IV fluids and thiamine.  He has had a long hospitalization because of unsafe discharge planning.  He is awaiting SNF placement.  He is currently medically stable for discharge.  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  Patient presented with some confusion, but this appears to be at his current baseline due to progression of alcohol-related dementia-currently stable. -Continue thiamine, multivitamin and folic acid along with vitamin B12  Severe protein calorie malnutrition due to alcoholism Failure to thrive -Continue nutritional supplements as per dietary recommendations  Hypertension -Blood pressure stable.  Continue Coreg  Acute kidney injury -Resolved.  Alcoholic hepatitis -Resolved  DVT prophylaxis: Lovenox Code Status: Full Family Communication: None at bedside Disposition Plan: Status is: Inpatient  Remains inpatient appropriate because:Unsafe d/c plan   Dispo: The patient is from: Home              Anticipated d/c is to: Group home              Patient currently is medically stable to d/c.   Difficult to place patient Yes    Consultants: None  Procedures: None  Antimicrobials: None   Subjective: Patient seen and examined at bedside.  Poor historian.  No overnight  fever, vomiting or worsening shortness of breath reported.   Objective: Vitals:   03/13/21 0605 03/13/21 1305 03/13/21 2121 03/14/21 0407  BP: 116/79 (!) 150/85 128/75 128/80  Pulse: 88 89 97 95  Resp: 18 16 18 17   Temp: 98.4 F (36.9 C) 98.2 F (36.8 C) 98.5 F (36.9 C) 98 F (36.7 C)  TempSrc: Oral Oral Oral Oral  SpO2: 96% 99% 98% 97%  Weight:      Height:        Intake/Output Summary (Last 24 hours) at 03/14/2021 0734 Last data filed at 03/14/2021 0600 Gross per 24 hour  Intake 600 ml  Output --  Net 600 ml   Filed Weights   03/11/21 0544 03/12/21 0500 03/13/21 0500  Weight: 74.2 kg 80.1 kg 80.5 kg    Examination:  General exam: On room air currently.  No acute distress.  Chronically ill looking and looks older than stated age. Respiratory system: Bilateral decreased breath sounds at bases  cardiovascular system: S1-S2 heard, intermittently tachycardic  gastrointestinal system: Abdomen is mildly distended, soft and nontender.  Bowel sounds are heard  extremities: No clubbing; trace lower extremity edema present   Data Reviewed: I have personally reviewed following labs and imaging studies  CBC: No results for input(s): WBC, NEUTROABS, HGB, HCT, MCV, PLT in the last 168 hours. Basic Metabolic Panel: No results for input(s): NA, K, CL, CO2, GLUCOSE, BUN, CREATININE, CALCIUM, MG, PHOS in the last 168 hours. GFR: Estimated Creatinine Clearance: 80.3 mL/min (by C-G formula based on SCr of 0.99 mg/dL). Liver Function Tests: No results  for input(s): AST, ALT, ALKPHOS, BILITOT, PROT, ALBUMIN in the last 168 hours. No results for input(s): LIPASE, AMYLASE in the last 168 hours. No results for input(s): AMMONIA in the last 168 hours. Coagulation Profile: No results for input(s): INR, PROTIME in the last 168 hours. Cardiac Enzymes: No results for input(s): CKTOTAL, CKMB, CKMBINDEX, TROPONINI in the last 168 hours. BNP (last 3 results) No results for input(s): PROBNP in  the last 8760 hours. HbA1C: No results for input(s): HGBA1C in the last 72 hours. CBG: No results for input(s): GLUCAP in the last 168 hours. Lipid Profile: No results for input(s): CHOL, HDL, LDLCALC, TRIG, CHOLHDL, LDLDIRECT in the last 72 hours. Thyroid Function Tests: No results for input(s): TSH, T4TOTAL, FREET4, T3FREE, THYROIDAB in the last 72 hours. Anemia Panel: No results for input(s): VITAMINB12, FOLATE, FERRITIN, TIBC, IRON, RETICCTPCT in the last 72 hours. Sepsis Labs: No results for input(s): PROCALCITON, LATICACIDVEN in the last 168 hours.  No results found for this or any previous visit (from the past 240 hour(s)).       Radiology Studies: No results found.      Scheduled Meds: . carvedilol  6.25 mg Oral BID WC  . enoxaparin (LOVENOX) injection  40 mg Subcutaneous Q24H  . feeding supplement  237 mL Oral TID BM  . folic acid  1 mg Oral Daily  . multivitamin with minerals  1 tablet Oral Daily  . QUEtiapine  25 mg Oral BID  . sodium chloride flush  3 mL Intravenous Q12H  . thiamine  100 mg Oral Daily  . vitamin B-12  1,000 mcg Oral Daily   Continuous Infusions:        Glade Lloyd, MD [facility] 03/14/2021, 7:34 AM

## 2021-03-14 NOTE — Plan of Care (Signed)
°  Problem: Clinical Measurements: °Goal: Ability to maintain clinical measurements within normal limits will improve °Outcome: Adequate for Discharge °Goal: Will remain free from infection °Outcome: Adequate for Discharge °  °

## 2021-03-15 NOTE — Progress Notes (Signed)
Patient ID: Alvin Clark, male   DOB: 05/31/1961, 60 y.o.   MRN: [id number]  PROGRESS NOTE    Alvin Clark  [id number]:[id number] DOB: October 26, 1960 DOA: 12/27/2020 PCP: Patient, No Pcp Per (Inactive)   Brief Narrative:  60 year old male with history of alcohol abuse presented with confusion after [facility] PD had to enter his home after eviction and he was found confused, frail and disoriented.  On presentation, he was tachycardic, slightly hypertensive with elevated LFTs.  CT of the head was negative for acute abnormality.  Chest x-ray was clear.  He was treated with IV fluids and thiamine.  He has had a long hospitalization because of unsafe discharge.  He remains medically stable awaiting long-term placement.  Assessment & Plan:   Alcohol-related dementia Alcohol use disorder, severe, now in remission Macrocytic anemia - presented with some confusion, but this appears to be at his current baseline due to progression of alcohol-related dementia-currently stable. -Continue thiamine, multivitamin and folic acid along with vitamin B12 -TOC team following, difficult placement  Severe protein calorie malnutrition  Failure to thrive -Continue nutritional supplements as per dietary recommendations  Hypertension -Stable, continue Coreg  Acute kidney injury -Resolved.  Alcoholic hepatitis -Resolved  DVT prophylaxis: Lovenox Code Status: Full Family Communication: None at bedside Disposition Plan: Status is: Inpatient  Remains inpatient appropriate because:Unsafe d/c plan   Dispo: The patient is from: Home              Anticipated d/c is to: Group home              Patient currently is medically stable to d/c.   Difficult to place patient Yes    Consultants: None  Procedures: None  Antimicrobials: None   Subjective: -Feels okay, no complaints, no events overnight Objective: Vitals:   03/14/21 2020 03/15/21 0414 03/15/21 0421 03/15/21 0801  BP: 138/80 123/82  127/85  Pulse:  81 86  96  Resp: 18 17    Temp: 98.6 F (37 C) 98.2 F (36.8 C)    TempSrc: Oral Oral    SpO2: 95% 97%    Weight:   79.1 kg   Height:        Intake/Output Summary (Last 24 hours) at 03/15/2021 1139 Last data filed at 03/15/2021 0600 Gross per 24 hour  Intake 600 ml  Output --  Net 600 ml   Filed Weights   03/12/21 0500 03/13/21 0500 03/15/21 0421  Weight: 80.1 kg 80.5 kg 79.1 kg    Examination:  General exam: Awake alert oriented to self and partly to place, cognitive deficits noted, no distress HEENT: No JVD CVS: S1-S2, regular rate rhythm Lungs: Clear bilaterally Abdomen: Soft, nontender, bowel sounds present Extremities: No edema   Data Reviewed: I have personally reviewed following labs and imaging studies  CBC: No results for input(s): WBC, NEUTROABS, HGB, HCT, MCV, PLT in the last 168 hours. Basic Metabolic Panel: No results for input(s): NA, K, CL, CO2, GLUCOSE, BUN, CREATININE, CALCIUM, MG, PHOS in the last 168 hours. GFR: Estimated Creatinine Clearance: 80.3 mL/min (by C-G formula based on SCr of 0.99 mg/dL). Liver Function Tests: No results for input(s): AST, ALT, ALKPHOS, BILITOT, PROT, ALBUMIN in the last 168 hours. No results for input(s): LIPASE, AMYLASE in the last 168 hours. No results for input(s): AMMONIA in the last 168 hours. Coagulation Profile: No results for input(s): INR, PROTIME in the last 168 hours. Cardiac Enzymes: No results for input(s): CKTOTAL, CKMB, CKMBINDEX, TROPONINI in the last 168  hours. BNP (last 3 results) No results for input(s): PROBNP in the last 8760 hours. HbA1C: No results for input(s): HGBA1C in the last 72 hours. CBG: No results for input(s): GLUCAP in the last 168 hours. Lipid Profile: No results for input(s): CHOL, HDL, LDLCALC, TRIG, CHOLHDL, LDLDIRECT in the last 72 hours. Thyroid Function Tests: No results for input(s): TSH, T4TOTAL, FREET4, T3FREE, THYROIDAB in the last 72 hours. Anemia Panel: No results  for input(s): VITAMINB12, FOLATE, FERRITIN, TIBC, IRON, RETICCTPCT in the last 72 hours. Sepsis Labs: No results for input(s): PROCALCITON, LATICACIDVEN in the last 168 hours.  No results found for this or any previous visit (from the past 240 hour(s)).       Radiology Studies: No results found.      Scheduled Meds: . carvedilol  6.25 mg Oral BID WC  . enoxaparin (LOVENOX) injection  40 mg Subcutaneous Q24H  . feeding supplement  237 mL Oral TID BM  . folic acid  1 mg Oral Daily  . multivitamin with minerals  1 tablet Oral Daily  . QUEtiapine  25 mg Oral BID  . sodium chloride flush  3 mL Intravenous Q12H  . thiamine  100 mg Oral Daily  . vitamin B-12  1,000 mcg Oral Daily   Continuous Infusions:  Zannie Cove, MD [facility] 03/15/2021, 11:39 AM

## 2021-03-16 NOTE — Progress Notes (Signed)
Pt seen and examined -pleasant, resting in bed, no complaints -homeless, chronic alcoholism with cognitive issues, awaiting Medicaid for Long term placement  Zannie Cove, MD

## 2021-03-16 NOTE — TOC Progression Note (Signed)
Transition of Care [facility]) - Progression Note    Patient Details  Name: ELL TISO MRN: [id number] Date of Birth: Nov 29, 1960  Transition of Care [facility]) CM/SW Contact  Halford Chessman Phone Number: 03/16/2021, 5:47 PM  Clinical Narrative:     CSW received phone call from Johnny Bridge from [employer] [phone] to discuss patient's disability application.  She requested documentation from patient's chart of his medical records so they can have a lawyer review, patient's request for disability.  CSW efaxed requested clinicals to [phone].  She will continue working on patient's case, and will update CSW with any new decision.  Expected Discharge Plan: Assisted Living (versus family care home) Barriers to Discharge: Financial Resources,Inadequate or no insurance,Other (must enter comment) (No bed availability at an ALF or Memory care facility.)  Expected Discharge Plan and Services Expected Discharge Plan: Assisted Living (versus family care home) In-house Referral: Clinical Social Work,Financial Counselor     Living arrangements for the past 2 months: No permanent address                                       Social Determinants of Health (SDOH) Interventions    Readmission Risk Interventions No flowsheet data found.

## 2021-03-17 NOTE — Progress Notes (Signed)
Patient seen and examined, no changes -Remains medically stable -TOC team following, difficult placement, disability application in process  Zannie Cove, MD

## 2021-03-18 LAB — CBC
HCT: 43.3 % (ref 39.0–52.0)
Hemoglobin: 14.1 g/dL (ref 13.0–17.0)
MCH: 31.1 pg (ref 26.0–34.0)
MCHC: 32.6 g/dL (ref 30.0–36.0)
MCV: 95.6 fL (ref 80.0–100.0)
Platelets: 220 10*3/uL (ref 150–400)
RBC: 4.53 MIL/uL (ref 4.22–5.81)
RDW: 12.1 % (ref 11.5–15.5)
WBC: 9.5 10*3/uL (ref 4.0–10.5)
nRBC: 0 % (ref 0.0–0.2)

## 2021-03-18 LAB — BASIC METABOLIC PANEL
Anion gap: 7 (ref 5–15)
BUN: 23 mg/dL — ABNORMAL HIGH (ref 6–20)
CO2: 26 mmol/L (ref 22–32)
Calcium: 9.7 mg/dL (ref 8.9–10.3)
Chloride: 105 mmol/L (ref 98–111)
Creatinine, Ser: 0.96 mg/dL (ref 0.61–1.24)
GFR, Estimated: >60 mL/min (ref >60)
Glucose, Bld: 92 mg/dL (ref 70–99)
Potassium: 4.1 mmol/L (ref 3.5–5.1)
Sodium: 138 mmol/L (ref 135–145)

## 2021-03-18 NOTE — Progress Notes (Signed)
Patient seen and examined, remains stable, no changes -TOC following for difficult placement  Zannie Cove, MD

## 2021-03-19 ENCOUNTER — Encounter (HOSPITAL_COMMUNITY): Payer: Self-pay | Admitting: Internal Medicine

## 2021-03-19 NOTE — Progress Notes (Signed)
Patient ID: Alvin Clark, male   DOB: 1961/04/23, 60 y.o.   MRN: [id number]  PROGRESS NOTE    MANPREET STREY  [id number]:[id number] DOB: [birth year] DOA: 12/27/2020 PCP: Patient, No Pcp Per (Inactive)   Brief Narrative:  60 year old male with history of alcohol abuse presented with confusion after [facility] PD had to enter his home after eviction and he was found confused, frail and disoriented.  On presentation, he was tachycardic, slightly hypertensive with elevated LFTs.  CT of the head was negative for acute abnormality.  Chest x-ray was clear.  He was treated with IV fluids and thiamine.  He has had a long hospitalization because of unsafe discharge.  He remains medically stable awaiting long-term placement.  Assessment & Plan:   Alcohol-related dementia Alcohol use disorder, severe, now in remission Macrocytic anemia - presented with some confusion, but this appears to be at his current baseline due to progression of alcohol-related dementia-currently stable. -Continue thiamine, multivitamin and folic acid along with vitamin B12 -Remains medically stable -TOC team following, difficult placement  Severe protein calorie malnutrition  Failure to thrive -Continue nutritional supplements as per dietary recommendations  Hypertension -Stable, continue Coreg  Acute kidney injury -Resolved.  Alcoholic hepatitis -Resolved  DVT prophylaxis: Lovenox Code Status: Full Family Communication: None at bedside Disposition Plan: Status is: Inpatient  Remains inpatient appropriate because:Unsafe d/c plan   Dispo: The patient is from: Home              Anticipated d/c is to: Group home              Patient currently is medically stable to d/c.   Difficult to place patient Yes    Consultants: None  Procedures: None  Antimicrobials: None   Subjective: -Denies any complaints, no events overnight  Objective: Vitals:   03/18/21 0542 03/18/21 1317 03/18/21 2035 03/19/21 0536  BP: 124/80  121/75 140/82 125/84  Pulse: 87 99 92 90  Resp: 18 18 18 18   Temp: 98.1 F (36.7 C) 99.7 F (37.6 C) 98.7 F (37.1 C) 97.7 F (36.5 C)  TempSrc: Oral  Oral Oral  SpO2: 96% 95% 98% 97%  Weight:      Height:        Intake/Output Summary (Last 24 hours) at 03/19/2021 1247 Last data filed at 03/19/2021 0809 Gross per 24 hour  Intake 960 ml  Output --  Net 960 ml   Filed Weights   03/12/21 0500 03/13/21 0500 03/15/21 0421  Weight: 80.1 kg 80.5 kg 79.1 kg    Examination:  General exam: Pleasant middle-aged male, appears much older than stated age, awake alert oriented to self and partly to place, cognitive deficits noted, no distress HEENT: No JVD CVS: S1-S2, regular rate rhythm Lungs: Clear bilaterally Abdomen: Soft, nontender, bowel sounds present , still there okay Extremities: okay okay in October No edema   Data Reviewed: I have personally reviewed following labs and imaging studies  CBC: Recent Labs  Lab 03/18/21 0514  WBC 9.5  HGB 14.1  HCT 43.3  MCV 95.6  PLT 220   Basic Metabolic Panel: Recent Labs  Lab 03/18/21 0514  NA 138  K 4.1  CL 105  CO2 26  GLUCOSE 92  BUN 23*  CREATININE 0.96  CALCIUM 9.7   GFR: Estimated Creatinine Clearance: 82.9 mL/min (by C-G formula based on SCr of 0.96 mg/dL). Liver Function Tests: No results for input(s): AST, ALT, ALKPHOS, BILITOT, PROT, ALBUMIN in the last 168 hours. No results  for input(s): LIPASE, AMYLASE in the last 168 hours. No results for input(s): AMMONIA in the last 168 hours. Coagulation Profile: No results for input(s): INR, PROTIME in the last 168 hours. Cardiac Enzymes: No results for input(s): CKTOTAL, CKMB, CKMBINDEX, TROPONINI in the last 168 hours. BNP (last 3 results) No results for input(s): PROBNP in the last 8760 hours. HbA1C: No results for input(s): HGBA1C in the last 72 hours. CBG: No results for input(s): GLUCAP in the last 168 hours. Lipid Profile: No results for input(s): CHOL,  HDL, LDLCALC, TRIG, CHOLHDL, LDLDIRECT in the last 72 hours. Thyroid Function Tests: No results for input(s): TSH, T4TOTAL, FREET4, T3FREE, THYROIDAB in the last 72 hours. Anemia Panel: No results for input(s): VITAMINB12, FOLATE, FERRITIN, TIBC, IRON, RETICCTPCT in the last 72 hours. Sepsis Labs: No results for input(s): PROCALCITON, LATICACIDVEN in the last 168 hours.  No results found for this or any previous visit (from the past 240 hour(s)).    Scheduled Meds: . carvedilol  6.25 mg Oral BID WC  . enoxaparin (LOVENOX) injection  40 mg Subcutaneous Q24H  . feeding supplement  237 mL Oral TID BM  . folic acid  1 mg Oral Daily  . multivitamin with minerals  1 tablet Oral Daily  . QUEtiapine  25 mg Oral BID  . sodium chloride flush  3 mL Intravenous Q12H  . thiamine  100 mg Oral Daily  . vitamin B-12  1,000 mcg Oral Daily   Continuous Infusions:  Zannie Cove, MD [facility] 03/19/2021, 12:47 PM

## 2021-03-19 NOTE — Plan of Care (Signed)

## 2021-03-20 NOTE — Progress Notes (Signed)
Patient prefers to wear regular clothing instead of hospital gowns so this RN called patient's family and asked for shirts and shorts to be brought in for patient. Patient's sister willing to bring clothing as requested.

## 2021-03-20 NOTE — Progress Notes (Signed)
Patient seen and examined, no changes from my note yesterday -Remains medically stable, awaiting placement  Zannie Cove, MD

## 2021-03-21 DIAGNOSIS — D538 Other specified nutritional anemias: Secondary | ICD-10-CM | POA: Diagnosis not present

## 2021-03-21 DIAGNOSIS — E860 Dehydration: Secondary | ICD-10-CM | POA: Diagnosis present

## 2021-03-21 DIAGNOSIS — I10 Essential (primary) hypertension: Secondary | ICD-10-CM | POA: Diagnosis not present

## 2021-03-21 DIAGNOSIS — F17210 Nicotine dependence, cigarettes, uncomplicated: Secondary | ICD-10-CM | POA: Diagnosis present

## 2021-03-21 DIAGNOSIS — H538 Other visual disturbances: Secondary | ICD-10-CM | POA: Diagnosis present

## 2021-03-21 DIAGNOSIS — E872 Acidosis: Secondary | ICD-10-CM | POA: Diagnosis not present

## 2021-03-21 DIAGNOSIS — R54 Age-related physical debility: Secondary | ICD-10-CM | POA: Diagnosis present

## 2021-03-21 DIAGNOSIS — Z20822 Contact with and (suspected) exposure to covid-19: Secondary | ICD-10-CM | POA: Diagnosis present

## 2021-03-21 DIAGNOSIS — Z681 Body mass index (BMI) 19 or less, adult: Secondary | ICD-10-CM | POA: Diagnosis not present

## 2021-03-21 DIAGNOSIS — F1027 Alcohol dependence with alcohol-induced persisting dementia: Secondary | ICD-10-CM | POA: Diagnosis present

## 2021-03-21 DIAGNOSIS — K7010 Alcoholic hepatitis without ascites: Secondary | ICD-10-CM | POA: Diagnosis present

## 2021-03-21 DIAGNOSIS — D72829 Elevated white blood cell count, unspecified: Secondary | ICD-10-CM | POA: Diagnosis present

## 2021-03-21 DIAGNOSIS — E43 Unspecified severe protein-calorie malnutrition: Secondary | ICD-10-CM | POA: Diagnosis present

## 2021-03-21 DIAGNOSIS — N179 Acute kidney failure, unspecified: Secondary | ICD-10-CM | POA: Diagnosis present

## 2021-03-21 DIAGNOSIS — R4182 Altered mental status, unspecified: Secondary | ICD-10-CM | POA: Diagnosis present

## 2021-03-21 DIAGNOSIS — F10239 Alcohol dependence with withdrawal, unspecified: Secondary | ICD-10-CM | POA: Diagnosis present

## 2021-03-21 DIAGNOSIS — F17290 Nicotine dependence, other tobacco product, uncomplicated: Secondary | ICD-10-CM | POA: Diagnosis present

## 2021-03-21 DIAGNOSIS — Z6822 Body mass index (BMI) 22.0-22.9, adult: Secondary | ICD-10-CM | POA: Diagnosis not present

## 2021-03-21 DIAGNOSIS — Y900 Blood alcohol level of less than 20 mg/100 ml: Secondary | ICD-10-CM | POA: Diagnosis present

## 2021-03-21 NOTE — TOC Progression Note (Signed)
Transition of Care [facility]) - Progression Note    Patient Details  Name: Alvin Clark MRN: [id number] Date of Birth: 06-08-1961  Transition [facility] Care [facility]) CM/SW Contact  Halford Chessman Phone Number: 03/21/2021, 5:02 PM  Clinical Narrative:     Patient's Medicaid and disability are still pending.  CSW to follow up with Johnny Bridge at [employer] [phone] on any new updates.  CSW to continue to follow.  Expected Discharge Plan: Assisted Living (versus family care home) Barriers to Discharge: Financial Resources,Inadequate or no insurance,Other (must enter comment) (No bed availability at an ALF or Memory care facility.)  Expected Discharge Plan and Services Expected Discharge Plan: Assisted Living (versus family care home) In-house Referral: Clinical Social Work,Financial Counselor     Living arrangements for the past 2 months: No permanent address                                       Social Determinants of Health (SDOH) Interventions    Readmission Risk Interventions No flowsheet data found.

## 2021-03-21 NOTE — Progress Notes (Signed)
Patient ID: Alvin Clark, male   DOB: 1961/04/19, 60 y.o.   MRN: [id number]  PROGRESS NOTE    Alvin Clark  [id number]:[id number] DOB: 03-Nov-1960 DOA: 12/27/2020 PCP: Patient, No Pcp Per (Inactive)   Brief Narrative:  60 year old male with history of alcohol abuse presented with confusion after [facility] PD had to enter his home after eviction and he was found confused, frail and disoriented.  On presentation, he was tachycardic, slightly hypertensive with elevated LFTs.  CT of the head was negative for acute abnormality.  Chest x-ray was clear.  He was treated with IV fluids and thiamine.  He has had a long hospitalization because of unsafe discharge.  He remains medically stable awaiting long-term placement.  Assessment & Plan:   Alcohol-related dementia Alcohol use disorder, severe, now in remission Macrocytic anemia - presented with some confusion, but this appears to be at his current baseline likely due to progression of alcohol-related dementia-currently stable. -Continue thiamine, multivitamin and folic acid along with vitamin B12 -Remains medically stable -TOC team following, difficult placement  Severe protein calorie malnutrition  Failure to thrive -Continue nutritional supplements as per dietary recommendations  Hypertension -Stable, continue Coreg  Acute kidney injury -Resolved.  Alcoholic hepatitis -Resolved  DVT prophylaxis: Lovenox Code Status: Full Family Communication: None at bedside Disposition Plan: Status is: Inpatient  Remains inpatient appropriate because:Unsafe d/c plan   Dispo: The patient is from: Home              Anticipated d/c is to: Group home              Patient currently is medically stable to d/c.   Difficult to place patient Yes    Consultants: None  Procedures: None  Antimicrobials: None   Subjective: -No events overnight, denies any complaints  Objective: Vitals:   03/20/21 1426 03/20/21 2010 03/21/21 0436 03/21/21 0437  BP:  127/81 136/85 119/77   Pulse: 79 87 83   Resp: 16 18 18    Temp: 98.3 F (36.8 C) 97.6 F (36.4 C) 98.1 F (36.7 C)   TempSrc: Oral Oral Oral   SpO2:  98% 97%   Weight:    77.5 kg  Height:        Intake/Output Summary (Last 24 hours) at 03/21/2021 1129 Last data filed at 03/20/2021 1836 Gross per 24 hour  Intake 1063 ml  Output --  Net 1063 ml   Filed Weights   03/15/21 0421 03/20/21 0455 03/21/21 0437  Weight: 79.1 kg 81.6 kg 77.5 kg    Examination:  General exam: Pleasant middle-aged male, appears older than stated age, awake alert oriented to self and place, mild cognitive deficits, no distress HEENT: No JVD CVS: S1-S2, regular rate rhythm Lungs: Clear bilaterally Abdomen: Soft, nontender, bowel sounds present Extremities: No edema   Data Reviewed: I have personally reviewed following labs and imaging studies  CBC: Recent Labs  Lab 03/18/21 0514  WBC 9.5  HGB 14.1  HCT 43.3  MCV 95.6  PLT 220   Basic Metabolic Panel: Recent Labs  Lab 03/18/21 0514  NA 138  K 4.1  CL 105  CO2 26  GLUCOSE 92  BUN 23*  CREATININE 0.96  CALCIUM 9.7   GFR: Estimated Creatinine Clearance: 82.9 mL/min (by C-G formula based on SCr of 0.96 mg/dL). Liver Function Tests: No results for input(s): AST, ALT, ALKPHOS, BILITOT, PROT, ALBUMIN in the last 168 hours. No results for input(s): LIPASE, AMYLASE in the last 168 hours. No results for  input(s): AMMONIA in the last 168 hours. Coagulation Profile: No results for input(s): INR, PROTIME in the last 168 hours. Cardiac Enzymes: No results for input(s): CKTOTAL, CKMB, CKMBINDEX, TROPONINI in the last 168 hours. BNP (last 3 results) No results for input(s): PROBNP in the last 8760 hours. HbA1C: No results for input(s): HGBA1C in the last 72 hours. CBG: No results for input(s): GLUCAP in the last 168 hours. Lipid Profile: No results for input(s): CHOL, HDL, LDLCALC, TRIG, CHOLHDL, LDLDIRECT in the last 72 hours. Thyroid  Function Tests: No results for input(s): TSH, T4TOTAL, FREET4, T3FREE, THYROIDAB in the last 72 hours. Anemia Panel: No results for input(s): VITAMINB12, FOLATE, FERRITIN, TIBC, IRON, RETICCTPCT in the last 72 hours. Sepsis Labs: No results for input(s): PROCALCITON, LATICACIDVEN in the last 168 hours.  No results found for this or any previous visit (from the past 240 hour(s)).    Scheduled Meds: . carvedilol  6.25 mg Oral BID WC  . enoxaparin (LOVENOX) injection  40 mg Subcutaneous Q24H  . feeding supplement  237 mL Oral TID BM  . folic acid  1 mg Oral Daily  . multivitamin with minerals  1 tablet Oral Daily  . QUEtiapine  25 mg Oral BID  . thiamine  100 mg Oral Daily  . vitamin B-12  1,000 mcg Oral Daily   Continuous Infusions:  Zannie Cove, MD [facility] 03/21/2021, 11:29 AM

## 2021-03-22 NOTE — Plan of Care (Signed)
  Problem: Education: Goal: Knowledge of General Education information will improve Description: Including pain rating scale, medication(s)/side effects and non-pharmacologic comfort measures Outcome: Progressing   Problem: Health Behavior/Discharge Planning: Goal: Ability to manage health-related needs will improve Outcome: Progressing   Problem: Clinical Measurements: Goal: Ability to maintain clinical measurements within normal limits will improve Outcome: Progressing Goal: Will remain free from infection Outcome: Progressing Goal: Diagnostic test results will improve Outcome: Progressing Goal: Respiratory complications will improve Outcome: Progressing Goal: Cardiovascular complication will be avoided Outcome: Progressing   Problem: Activity: Goal: Risk for activity intolerance will decrease Outcome: Progressing   Problem: Nutrition: Goal: Adequate nutrition will be maintained Outcome: Progressing   Problem: Coping: Goal: Level of anxiety will decrease Outcome: Progressing   Problem: Elimination: Goal: Will not experience complications related to bowel motility Outcome: Progressing Goal: Will not experience complications related to urinary retention Outcome: Progressing   Problem: Pain Managment: Goal: General experience of comfort will improve Outcome: Progressing   Problem: Safety: Goal: Ability to remain free from injury will improve Outcome: Progressing   Problem: Skin Integrity: Goal: Risk for impaired skin integrity will decrease Outcome: Progressing   Problem: Education: Goal: Knowledge of disease and its progression will improve Outcome: Progressing Goal: Individualized Educational Video(s) Outcome: Progressing   Problem: Fluid Volume: Goal: Compliance with measures to maintain balanced fluid volume will improve Outcome: Progressing   Problem: Health Behavior/Discharge Planning: Goal: Ability to manage health-related needs will  improve Outcome: Progressing   Problem: Nutritional: Goal: Ability to make healthy dietary choices will improve Outcome: Progressing   Problem: Clinical Measurements: Goal: Complications related to the disease process, condition or treatment will be avoided or minimized Outcome: Progressing   Problem: Safety: Goal: Non-violent Restraint(s) Outcome: Progressing   

## 2021-03-22 NOTE — Progress Notes (Signed)
PROGRESS NOTE    Alvin Clark  BZJ:[id number]  DOB: [birth year]  DOA: 12/27/2020 PCP: Patient, No Pcp Per (Inactive) Outpatient Specialists:   Hospital course:  60 year old man was admitted 12/27/2020 after he was found frail, confused and disoriented when [facility] police entered his home to evict him.  Patient has history of alcohol abuse and been treated with IV fluids and thiamine.  He has had a long hospitalization because of unsafe discharge and difficulty with finding long-term placement for him.   Subjective:  Patient states he is doing well, he has no concerns.  He has no pain.  He is not constipated.  He is enjoying watching TV.   Objective: Vitals:   03/21/21 1938 03/22/21 0312 03/22/21 0457 03/22/21 1309  BP: 126/89 121/84  126/81  Pulse: (!) 105 93  92  Resp: 15 15  19   Temp: 98 F (36.7 C) 98.1 F (36.7 C)  (!) 97.1 F (36.2 C)  TempSrc: Oral Oral    SpO2: 97% 97%  97%  Weight:   78.2 kg   Height:        Intake/Output Summary (Last 24 hours) at 03/22/2021 1354 Last data filed at 03/22/2021 05/22/2021 Gross per 24 hour  Intake 480 ml  Output --  Net 480 ml   Filed Weights   03/20/21 0455 03/21/21 0437 03/22/21 0457  Weight: 81.6 kg 77.5 kg 78.2 kg     Exam:  General: Frail appearing man looking much older than stated age sitting up in bed watching TV in no acute distress and good spirits. Eyes: sclera anicteric, conjuctiva mild injection bilaterally CVS: S1-S2, regular  Respiratory:  decreased air entry bilaterally GI: NABS, soft, NT  LE: No edema.  Neuro:  grossly nonfocal.    Assessment & Plan:   Medically stable patient with history of alcohol use is awaiting long-term bed placement in no acute distress.  Copied and pasted from Dr. 05/22/21 note from yesterday: Alcohol-related dementia Alcohol use disorder, severe, now in remission Macrocytic anemia - presented with some confusion, but this appears to be at his current baseline likely  due to progression of alcohol-related dementia-currently stable. -Continue thiamine, multivitamin and folic acid along with vitamin B12 -Remains medically stable -TOC team following, difficult placement  Severe protein calorie malnutrition  Failure to thrive -Continue nutritional supplements as per dietary recommendations  Hypertension -Stable, continue Coreg  Acute kidney injury -Resolved.  Alcoholic hepatitis -Resolved  DVT prophylaxis: Lovenox Code Status: Full Family Communication: None Disposition Plan:   Patient is from: Home  Anticipated Discharge Location: Group home  Barriers to Discharge: Difficulty in finding a bed  Is patient medically stable for Discharge: Yes   Consultants:  None  Procedures:  None    Data Reviewed:  Basic Metabolic Panel: Recent Labs  Lab 03/18/21 0514  NA 138  K 4.1  CL 105  CO2 26  GLUCOSE 92  BUN 23*  CREATININE 0.96  CALCIUM 9.7   Liver Function Tests: No results for input(s): AST, ALT, ALKPHOS, BILITOT, PROT, ALBUMIN in the last 168 hours. No results for input(s): LIPASE, AMYLASE in the last 168 hours. No results for input(s): AMMONIA in the last 168 hours. CBC: Recent Labs  Lab 03/18/21 0514  WBC 9.5  HGB 14.1  HCT 43.3  MCV 95.6  PLT 220   Cardiac Enzymes: No results for input(s): CKTOTAL, CKMB, CKMBINDEX, TROPONINI in the last 168 hours. BNP (last 3 results) No results for input(s): PROBNP in the last  8760 hours. CBG: No results for input(s): GLUCAP in the last 168 hours.  No results found for this or any previous visit (from the past 240 hour(s)).    Studies: No results found.   Scheduled Meds: . carvedilol  6.25 mg Oral BID WC  . enoxaparin (LOVENOX) injection  40 mg Subcutaneous Q24H  . feeding supplement  237 mL Oral TID BM  . folic acid  1 mg Oral Daily  . multivitamin with minerals  1 tablet Oral Daily  . QUEtiapine  25 mg Oral BID  . thiamine  100 mg Oral Daily  . vitamin B-12   1,000 mcg Oral Daily   Continuous Infusions:  Principal Problem:   AKI (acute kidney injury) (HCC) Active Problems:   Confusion   Leukocytosis   Blurry vision, bilateral   Transaminitis   Constipation   Withdrawal symptoms, alcohol (HCC)   Protein-calorie malnutrition, severe   Acute metabolic encephalopathy   Hypokalemia   Hypomagnesemia   Hypophosphatemia   Fall   Debility   Low vitamin B12 level   Chronic alcohol use     Alvin Clark, [facility]  If 7PM-7AM, please contact night-coverage [email or web address]   LOS: 84 days

## 2021-03-23 NOTE — Progress Notes (Signed)
PROGRESS NOTE    Alvin Clark  [id number]:[id number] DOB: 07-25-1961 DOA: 12/27/2020 PCP: Patient, No Pcp Per (Inactive)   Chief Complain: Confusion  Brief Narrative: Patient is a 60 year old male with history of chronic alcohol abuse who was initially admitted on 12/27/2020 after he was found frail, confused and disoriented when [facility] police entered his home to evict him.  On presentation he was confused.  He was started on IV fluids, thiamine.  He had prolonged hospitalization because of unsafe discharge and no place to go.  Pending placement.  Assessment & Plan:   Principal Problem:   AKI (acute kidney injury) (HCC) Active Problems:   Confusion   Leukocytosis   Blurry vision, bilateral   Transaminitis   Constipation   Withdrawal symptoms, alcohol (HCC)   Protein-calorie malnutrition, severe   Acute metabolic encephalopathy   Hypokalemia   Hypomagnesemia   Hypophosphatemia   Fall   Debility   Low vitamin B12 level   Chronic alcohol use   Metabolic encephalopathy/confusion: On presentation he was confused. 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  Continue thiamine, multivitamin, folic acid, vitamin B12.  Monitor mental status. CT head did not show any acute intracranial normalities.  Severe protein calorie malnutrition/failure to thrive: Secondary to chronic alcohol abuse.  Nutrition team is following here.  Hypertension: Currently blood pressure stable.  Continue Coreg  AKI: Resolved  Alcoholic hepatitis: Resolved    Nutrition Problem: Severe Malnutrition Etiology: chronic illness (chronic alcoholism)      DVT prophylaxis: Lovenox Code Status: Full Family Communication: None at bedside Status is: Inpatient  Remains inpatient appropriate because:Inpatient level of care appropriate due to severity of illness   Dispo: The patient is from: Home              Anticipated d/c is to: Home               Patient currently is medically stable to d/c.   Difficult to place patient Yes    Consultants: None  Procedures:Npne  Antimicrobials:  Anti-infectives (From admission, onward)   None      Subjective:  Patient seen and examined the bedside this morning.  Hemodynamically stable.  Not happy because TV in the room is not working.  Denies any new complaints   Objective: Vitals:   03/22/21 1309 03/22/21 1925 03/23/21 0340 03/23/21 0500  BP: 126/81 134/85 129/83   Pulse: 92 91 91   Resp: 19 15 15    Temp: (!) 97.1 F (36.2 C) 98.4 F (36.9 C) 98.2 F (36.8 C)   TempSrc:  Oral Oral   SpO2: 97% 96% 94%   Weight:    76.2 kg  Height:        Intake/Output Summary (Last 24 hours) at 03/23/2021 0818 Last data filed at 03/22/2021 05/22/2021 Gross per 24 hour  Intake 480 ml  Output --  Net 480 ml   Filed Weights   03/21/21 0437 03/22/21 0457 03/23/21 0500  Weight: 77.5 kg 78.2 kg 76.2 kg    Examination:  General exam: Overall comfortable, not in distress HEENT: PERRL Respiratory system:  no wheezes or crackles  Cardiovascular system: S1 & S2 heard, RRR.  Gastrointestinal system: Abdomen is nondistended, soft and nontender. Central nervous system: Alert and oriented Extremities: No edema, no clubbing ,no cyanosis Skin: No rashes, no ulcers,no icterus     Data Reviewed: I have personally reviewed following labs and imaging studies  CBC: Recent Labs  Lab 03/18/21 0514  WBC 9.5  HGB 14.1  HCT 43.3  MCV 95.6  PLT 220   Basic Metabolic Panel: Recent Labs  Lab 03/18/21 0514  NA 138  K 4.1  CL 105  CO2 26  GLUCOSE 92  BUN 23*  CREATININE 0.96  CALCIUM 9.7   GFR: Estimated Creatinine Clearance: 82.9 mL/min (by C-G formula based on SCr of 0.96 mg/dL). Liver Function Tests: No results for input(s): AST, ALT, ALKPHOS, BILITOT, PROT, ALBUMIN in the last 168 hours. No results for input(s): LIPASE, AMYLASE in the last 168 hours. No results for input(s): AMMONIA in the  last 168 hours. Coagulation Profile: No results for input(s): INR, PROTIME in the last 168 hours. Cardiac Enzymes: No results for input(s): CKTOTAL, CKMB, CKMBINDEX, TROPONINI in the last 168 hours. BNP (last 3 results) No results for input(s): PROBNP in the last 8760 hours. HbA1C: No results for input(s): HGBA1C in the last 72 hours. CBG: No results for input(s): GLUCAP in the last 168 hours. Lipid Profile: No results for input(s): CHOL, HDL, LDLCALC, TRIG, CHOLHDL, LDLDIRECT in the last 72 hours. Thyroid Function Tests: No results for input(s): TSH, T4TOTAL, FREET4, T3FREE, THYROIDAB in the last 72 hours. Anemia Panel: No results for input(s): VITAMINB12, FOLATE, FERRITIN, TIBC, IRON, RETICCTPCT in the last 72 hours. Sepsis Labs: No results for input(s): PROCALCITON, LATICACIDVEN in the last 168 hours.  No results found for this or any previous visit (from the past 240 hour(s)).       Radiology Studies: No results found.      Scheduled Meds: . carvedilol  6.25 mg Oral BID WC  . enoxaparin (LOVENOX) injection  40 mg Subcutaneous Q24H  . feeding supplement  237 mL Oral TID BM  . folic acid  1 mg Oral Daily  . multivitamin with minerals  1 tablet Oral Daily  . QUEtiapine  25 mg Oral BID  . thiamine  100 mg Oral Daily  . vitamin B-12  1,000 mcg Oral Daily   Continuous Infusions:   LOS: 85 days    Time spent:25 mins. More than 50% of that time was spent in counseling and/or coordination of care.      Burnadette Pop, MD [facility] P6/12/2020, 8:18 AM

## 2021-03-23 NOTE — Plan of Care (Signed)
  Problem: Education: Goal: Knowledge of General Education information will improve Description: Including pain rating scale, medication(s)/side effects and non-pharmacologic comfort measures Outcome: Progressing   Problem: Health Behavior/Discharge Planning: Goal: Ability to manage health-related needs will improve Outcome: Progressing   Problem: Clinical Measurements: Goal: Ability to maintain clinical measurements within normal limits will improve Outcome: Progressing Goal: Will remain free from infection Outcome: Progressing Goal: Diagnostic test results will improve Outcome: Progressing Goal: Respiratory complications will improve Outcome: Progressing Goal: Cardiovascular complication will be avoided Outcome: Progressing   Problem: Activity: Goal: Risk for activity intolerance will decrease Outcome: Progressing   Problem: Nutrition: Goal: Adequate nutrition will be maintained Outcome: Progressing   Problem: Coping: Goal: Level of anxiety will decrease Outcome: Progressing   Problem: Elimination: Goal: Will not experience complications related to bowel motility Outcome: Progressing Goal: Will not experience complications related to urinary retention Outcome: Progressing   Problem: Pain Managment: Goal: General experience of comfort will improve Outcome: Progressing   Problem: Safety: Goal: Ability to remain free from injury will improve Outcome: Progressing   Problem: Skin Integrity: Goal: Risk for impaired skin integrity will decrease Outcome: Progressing   Problem: Education: Goal: Knowledge of disease and its progression will improve Outcome: Progressing Goal: Individualized Educational Video(s) Outcome: Progressing   Problem: Fluid Volume: Goal: Compliance with measures to maintain balanced fluid volume will improve Outcome: Progressing   Problem: Health Behavior/Discharge Planning: Goal: Ability to manage health-related needs will  improve Outcome: Progressing   Problem: Nutritional: Goal: Ability to make healthy dietary choices will improve Outcome: Progressing   Problem: Clinical Measurements: Goal: Complications related to the disease process, condition or treatment will be avoided or minimized Outcome: Progressing   Problem: Safety: Goal: Non-violent Restraint(s) Outcome: Progressing   

## 2021-03-24 NOTE — Progress Notes (Signed)
PROGRESS NOTE    Alvin Clark  [id number]:[id number] DOB: 05/27/1961 DOA: 12/27/2020 PCP: Patient, No Pcp Per (Inactive)   Chief Complain: Confusion  Brief Narrative: Patient is a 60 year old male with history of chronic alcohol abuse who was initially admitted on 12/27/2020 after he was found frail, confused and disoriented when [facility] police entered his home to evict him.  On presentation he was confused.  He was started on IV fluids, thiamine.  He had prolonged hospitalization because of unsafe discharge and no place to go.  Pending placement.  Assessment & Plan:   Principal Problem:   AKI (acute kidney injury) (HCC) Active Problems:   Confusion   Leukocytosis   Blurry vision, bilateral   Transaminitis   Constipation   Withdrawal symptoms, alcohol (HCC)   Protein-calorie malnutrition, severe   Acute metabolic encephalopathy   Hypokalemia   Hypomagnesemia   Hypophosphatemia   Fall   Debility   Low vitamin B12 level   Chronic alcohol use   Metabolic encephalopathy/confusion: On presentation he was confused. 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  Continue thiamine, multivitamin, folic acid, vitamin B12.  Monitor mental status. CT head did not show any acute intracranial normalities.  Severe protein calorie malnutrition/failure to thrive: Secondary to chronic alcohol abuse.  Nutrition team is following here.  Hypertension: Currently blood pressure stable.  Continue Coreg  AKI: Resolved  Alcoholic hepatitis: Resolved    Nutrition Problem: Severe Malnutrition Etiology: chronic illness (chronic alcoholism)      DVT prophylaxis: Lovenox Code Status: Full Family Communication: None at bedside Status is: Inpatient  Remains inpatient appropriate because:Inpatient level of care appropriate due to severity of illness   Dispo: The patient is from: Home              Anticipated d/c is to: Home               Patient currently is medically stable to d/c.   Difficult to place patient Yes    Consultants: None  Procedures:Npne  Antimicrobials:  Anti-infectives (From admission, onward)   None      Subjective:  Patient seen and examined the bedside this morning.  Hemodynamically stable.  Comfortable.  No new complaints today.  Objective: Vitals:   03/23/21 1528 03/23/21 2123 03/24/21 0420 03/24/21 0500  BP: 131/78 136/90 125/83   Pulse: 84 96 88   Resp: 20 16 20    Temp: 98.3 F (36.8 C) (!) 97.3 F (36.3 C) 97.8 F (36.6 C)   TempSrc: Oral Oral Oral   SpO2: 97% 96% 95%   Weight:    81.1 kg  Height:        Intake/Output Summary (Last 24 hours) at 03/24/2021 0839 Last data filed at 03/24/2021 0804 Gross per 24 hour  Intake 1896 ml  Output 275 ml  Net 1621 ml   Filed Weights   03/22/21 0457 03/23/21 0500 03/24/21 0500  Weight: 78.2 kg 76.2 kg 81.1 kg    Examination:  General exam: Overall comfortable, not in distress, sleeping in the bed, woke up and sat on the side of the bed during my conversation.    Data Reviewed: I have personally reviewed following labs and imaging studies  CBC: Recent Labs  Lab 03/18/21 0514  WBC 9.5  HGB 14.1  HCT 43.3  MCV 95.6  PLT 220   Basic Metabolic Panel: Recent Labs  Lab 03/18/21 0514  NA 138  K 4.1  CL  105  CO2 26  GLUCOSE 92  BUN 23*  CREATININE 0.96  CALCIUM 9.7   GFR: Estimated Creatinine Clearance: 82.9 mL/min (by C-G formula based on SCr of 0.96 mg/dL). Liver Function Tests: No results for input(s): AST, ALT, ALKPHOS, BILITOT, PROT, ALBUMIN in the last 168 hours. No results for input(s): LIPASE, AMYLASE in the last 168 hours. No results for input(s): AMMONIA in the last 168 hours. Coagulation Profile: No results for input(s): INR, PROTIME in the last 168 hours. Cardiac Enzymes: No results for input(s): CKTOTAL, CKMB, CKMBINDEX, TROPONINI in the last 168 hours. BNP (last 3 results) No results for input(s):  PROBNP in the last 8760 hours. HbA1C: No results for input(s): HGBA1C in the last 72 hours. CBG: No results for input(s): GLUCAP in the last 168 hours. Lipid Profile: No results for input(s): CHOL, HDL, LDLCALC, TRIG, CHOLHDL, LDLDIRECT in the last 72 hours. Thyroid Function Tests: No results for input(s): TSH, T4TOTAL, FREET4, T3FREE, THYROIDAB in the last 72 hours. Anemia Panel: No results for input(s): VITAMINB12, FOLATE, FERRITIN, TIBC, IRON, RETICCTPCT in the last 72 hours. Sepsis Labs: No results for input(s): PROCALCITON, LATICACIDVEN in the last 168 hours.  No results found for this or any previous visit (from the past 240 hour(s)).       Radiology Studies: No results found.      Scheduled Meds: . carvedilol  6.25 mg Oral BID WC  . enoxaparin (LOVENOX) injection  40 mg Subcutaneous Q24H  . feeding supplement  237 mL Oral TID BM  . folic acid  1 mg Oral Daily  . multivitamin with minerals  1 tablet Oral Daily  . QUEtiapine  25 mg Oral BID  . thiamine  100 mg Oral Daily  . vitamin B-12  1,000 mcg Oral Daily   Continuous Infusions:   LOS: 86 days    Time spent:15 mins. More than 50% of that time was spent in counseling and/or coordination of care.      Burnadette Pop, MD [facility] P6/01/2021, 8:39 AM

## 2021-03-25 NOTE — Progress Notes (Signed)
PROGRESS NOTE    Alvin Clark  UXL:[id number] DOB: 22-Oct-1960 DOA: 12/27/2020 PCP: Patient, No Pcp Per (Inactive)   Chief Complain: Confusion  Brief Narrative: Patient is a 60 year old male with history of chronic alcohol abuse who was initially admitted on 12/27/2020 after he was found frail, confused and disoriented when [facility] police entered his home to evict him.  On presentation he was confused.  He was started on IV fluids, thiamine.  He had prolonged hospitalization because of unsafe discharge and no place to go.  Pending placement.  Assessment & Plan:   Principal Problem:   AKI (acute kidney injury) (HCC) Active Problems:   Confusion   Leukocytosis   Blurry vision, bilateral   Transaminitis   Constipation   Withdrawal symptoms, alcohol (HCC)   Protein-calorie malnutrition, severe   Acute metabolic encephalopathy   Hypokalemia   Hypomagnesemia   Hypophosphatemia   Fall   Debility   Low vitamin B12 level   Chronic alcohol use   Metabolic encephalopathy/confusion: On presentation he was confused. 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  Continue thiamine, multivitamin, folic acid, vitamin B12.  Monitor mental status. CT head did not show any acute intracranial normalities.  Severe protein calorie malnutrition/failure to thrive: Secondary to chronic alcohol abuse.  Nutrition team is following here.  Hypertension: Currently blood pressure stable.  Continue Coreg  AKI: Resolved  Alcoholic hepatitis: Resolved    Nutrition Problem: Severe Malnutrition Etiology: chronic illness (chronic alcoholism)      DVT prophylaxis: Lovenox Code Status: Full Family Communication: None at bedside Status is: Inpatient  Remains inpatient appropriate because:Inpatient level of care appropriate due to severity of illness   Dispo: The patient is from: Home              Anticipated d/c is to: Home               Patient currently is medically stable to d/c.   Difficult to place patient Yes    Consultants: None  Procedures:Npne  Antimicrobials:  Anti-infectives (From admission, onward)   None      Subjective:  Patient seen at the bedside today.  Denies any complaints.  Comfortable  Objective: Vitals:   03/24/21 0500 03/24/21 1347 03/24/21 2011 03/25/21 0431  BP:  (!) 151/89 133/87 130/80  Pulse:  90 92 89  Resp:  19 20 16   Temp:  (!) 97.5 F (36.4 C) 98.4 F (36.9 C) 97.7 F (36.5 C)  TempSrc:   Oral Oral  SpO2:  97% 95% 96%  Weight: 81.1 kg     Height:        Intake/Output Summary (Last 24 hours) at 03/25/2021 0821 Last data filed at 03/24/2021 1710 Gross per 24 hour  Intake 1200 ml  Output --  Net 1200 ml   Filed Weights   03/22/21 0457 03/23/21 0500 03/24/21 0500  Weight: 78.2 kg 76.2 kg 81.1 kg    Examination:  General exam: Comfortable, lying on the bed.  Denies any complaints    Data Reviewed: I have personally reviewed following labs and imaging studies  CBC: No results for input(s): WBC, NEUTROABS, HGB, HCT, MCV, PLT in the last 168 hours. Basic Metabolic Panel: No results for input(s): NA, K, CL, CO2, GLUCOSE, BUN, CREATININE, CALCIUM, MG, PHOS in the last 168 hours. GFR: Estimated Creatinine Clearance: 82.9 mL/min (by C-G formula based on SCr of 0.96 mg/dL). Liver Function Tests: No results for input(s):  AST, ALT, ALKPHOS, BILITOT, PROT, ALBUMIN in the last 168 hours. No results for input(s): LIPASE, AMYLASE in the last 168 hours. No results for input(s): AMMONIA in the last 168 hours. Coagulation Profile: No results for input(s): INR, PROTIME in the last 168 hours. Cardiac Enzymes: No results for input(s): CKTOTAL, CKMB, CKMBINDEX, TROPONINI in the last 168 hours. BNP (last 3 results) No results for input(s): PROBNP in the last 8760 hours. HbA1C: No results for input(s): HGBA1C in the last 72 hours. CBG: No results for input(s): GLUCAP in the  last 168 hours. Lipid Profile: No results for input(s): CHOL, HDL, LDLCALC, TRIG, CHOLHDL, LDLDIRECT in the last 72 hours. Thyroid Function Tests: No results for input(s): TSH, T4TOTAL, FREET4, T3FREE, THYROIDAB in the last 72 hours. Anemia Panel: No results for input(s): VITAMINB12, FOLATE, FERRITIN, TIBC, IRON, RETICCTPCT in the last 72 hours. Sepsis Labs: No results for input(s): PROCALCITON, LATICACIDVEN in the last 168 hours.  No results found for this or any previous visit (from the past 240 hour(s)).       Radiology Studies: No results found.      Scheduled Meds: . carvedilol  6.25 mg Oral BID WC  . enoxaparin (LOVENOX) injection  40 mg Subcutaneous Q24H  . feeding supplement  237 mL Oral TID BM  . folic acid  1 mg Oral Daily  . multivitamin with minerals  1 tablet Oral Daily  . QUEtiapine  25 mg Oral BID  . thiamine  100 mg Oral Daily  . vitamin B-12  1,000 mcg Oral Daily   Continuous Infusions:   LOS: 87 days    Time spent:15 mins. More than 50% of that time was spent in counseling and/or coordination of care.      Burnadette Pop, MD [facility] P6/02/2021, 8:21 AM

## 2021-03-26 NOTE — Progress Notes (Signed)
PROGRESS NOTE    Alvin Clark  FMB:[id number] DOB: 1961/03/10 DOA: 12/27/2020 PCP: Patient, No Pcp Per (Inactive)   Chief Complain: Confusion  Brief Narrative: Patient is a 60 year old male with history of chronic alcohol abuse who was initially admitted on 12/27/2020 after he was found frail, confused and disoriented when [facility] police entered his home to evict him.  On presentation he was confused.  He was started on IV fluids, thiamine.  He had prolonged hospitalization because of unsafe discharge and no place to go.  Pending placement.  Assessment & Plan:   Principal Problem:   AKI (acute kidney injury) (HCC) Active Problems:   Confusion   Leukocytosis   Blurry vision, bilateral   Transaminitis   Constipation   Withdrawal symptoms, alcohol (HCC)   Protein-calorie malnutrition, severe   Acute metabolic encephalopathy   Hypokalemia   Hypomagnesemia   Hypophosphatemia   Fall   Debility   Low vitamin B12 level   Chronic alcohol use   Metabolic encephalopathy/confusion: On presentation he was confused. 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  Continue thiamine, multivitamin, folic acid, vitamin B12.  Monitor mental status. CT head did not show any acute intracranial normalities.  Severe protein calorie malnutrition/failure to thrive: Secondary to chronic alcohol abuse.  Nutrition team is following here.  Hypertension: Currently blood pressure stable.  Continue Coreg  AKI: Resolved  Alcoholic hepatitis: Resolved    Nutrition Problem: Severe Malnutrition Etiology: chronic illness (chronic alcoholism)      DVT prophylaxis: Lovenox Code Status: Full Family Communication: None at bedside Status is: Inpatient  Remains inpatient appropriate because:Inpatient level of care appropriate due to severity of illness   Dispo: The patient is from: Home              Anticipated d/c is to: Home               Patient currently is medically stable to d/c.   Difficult to place patient Yes    Consultants: None  Procedures:Npne  Antimicrobials:  Anti-infectives (From admission, onward)   None      Subjective:  Patient seen at bedside this morning.  Sleeping as always, woke up on calling his name.  Denies any complaints  Objective: Vitals:   03/25/21 1352 03/25/21 2007 03/26/21 0500 03/26/21 0522  BP: 138/86 (!) 154/97  131/87  Pulse: 97 84  98  Resp: 17 16  16   Temp: (!) 97.5 F (36.4 C) (!) 97.5 F (36.4 C)  98 F (36.7 C)  TempSrc:  Oral  Oral  SpO2: 96% 96%  99%  Weight:   81.2 kg   Height:        Intake/Output Summary (Last 24 hours) at 03/26/2021 0808 Last data filed at 03/25/2021 1812 Gross per 24 hour  Intake 2132 ml  Output --  Net 2132 ml   Filed Weights   03/23/21 0500 03/24/21 0500 03/26/21 0500  Weight: 76.2 kg 81.1 kg 81.2 kg    Examination:  General exam: Comfortable, lying on the bed, alert and awake    Data Reviewed: I have personally reviewed following labs and imaging studies  CBC: No results for input(s): WBC, NEUTROABS, HGB, HCT, MCV, PLT in the last 168 hours. Basic Metabolic Panel: No results for input(s): NA, K, CL, CO2, GLUCOSE, BUN, CREATININE, CALCIUM, MG, PHOS in the last 168 hours. GFR: Estimated Creatinine Clearance: 82.9 mL/min (by C-G formula based on SCr of 0.96  mg/dL). Liver Function Tests: No results for input(s): AST, ALT, ALKPHOS, BILITOT, PROT, ALBUMIN in the last 168 hours. No results for input(s): LIPASE, AMYLASE in the last 168 hours. No results for input(s): AMMONIA in the last 168 hours. Coagulation Profile: No results for input(s): INR, PROTIME in the last 168 hours. Cardiac Enzymes: No results for input(s): CKTOTAL, CKMB, CKMBINDEX, TROPONINI in the last 168 hours. BNP (last 3 results) No results for input(s): PROBNP in the last 8760 hours. HbA1C: No results for input(s): HGBA1C in the last 72 hours. CBG: No  results for input(s): GLUCAP in the last 168 hours. Lipid Profile: No results for input(s): CHOL, HDL, LDLCALC, TRIG, CHOLHDL, LDLDIRECT in the last 72 hours. Thyroid Function Tests: No results for input(s): TSH, T4TOTAL, FREET4, T3FREE, THYROIDAB in the last 72 hours. Anemia Panel: No results for input(s): VITAMINB12, FOLATE, FERRITIN, TIBC, IRON, RETICCTPCT in the last 72 hours. Sepsis Labs: No results for input(s): PROCALCITON, LATICACIDVEN in the last 168 hours.  No results found for this or any previous visit (from the past 240 hour(s)).       Radiology Studies: No results found.      Scheduled Meds: . carvedilol  6.25 mg Oral BID WC  . enoxaparin (LOVENOX) injection  40 mg Subcutaneous Q24H  . feeding supplement  237 mL Oral TID BM  . folic acid  1 mg Oral Daily  . multivitamin with minerals  1 tablet Oral Daily  . QUEtiapine  25 mg Oral BID  . thiamine  100 mg Oral Daily  . vitamin B-12  1,000 mcg Oral Daily   Continuous Infusions:   LOS: 88 days    Time spent:15 mins. More than 50% of that time was spent in counseling and/or coordination of care.      Burnadette Pop, MD [facility] P6/03/2021, 8:08 AM

## 2021-03-27 NOTE — Plan of Care (Signed)
  Problem: Health Behavior/Discharge Planning: Goal: Ability to manage health-related needs will improve Outcome: Progressing   Problem: Clinical Measurements: Goal: Will remain free from infection Outcome: Progressing Goal: Diagnostic test results will improve Outcome: Progressing   Problem: Activity: Goal: Risk for activity intolerance will decrease Outcome: Progressing   

## 2021-03-27 NOTE — Progress Notes (Signed)
Alvin Clark, Alvin Pcp Per (Inactive)   Chief Complain: Confusion  Brief Narrative: Clark is a 60 year old male with history of chronic alcohol abuse who was initially admitted on 12/27/2020 after he was found frail, confused and disoriented when [facility] police entered his home to evict him.  On presentation he was confused.  He was started on IV fluids, thiamine.  He had prolonged hospitalization because of unsafe discharge and Alvin place to go.  Pending placement.  Assessment & Plan:   Principal Problem:   AKI (acute kidney injury) (HCC) Active Problems:   Confusion   Leukocytosis   Blurry vision, bilateral   Transaminitis   Constipation   Withdrawal symptoms, alcohol (HCC)   Protein-calorie malnutrition, severe   Acute metabolic encephalopathy   Hypokalemia   Hypomagnesemia   Hypophosphatemia   Fall   Debility   Low vitamin B12 level   Chronic alcohol use   Metabolic encephalopathy/confusion: On presentation he was confused. 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  Continue thiamine, multivitamin, folic acid, vitamin B12.  Monitor mental status. CT head did not show any acute intracranial normalities.  Severe protein calorie malnutrition/failure to thrive: Secondary to chronic alcohol abuse.  Nutrition team is following here.  Hypertension: Currently blood pressure stable.  Continue Coreg  AKI: Resolved  Alcoholic hepatitis: Resolved    Nutrition Problem: Severe Malnutrition Etiology: chronic illness (chronic alcoholism)      DVT prophylaxis: Lovenox Code Status: Full Family Communication: None at bedside Status is: Inpatient  Remains inpatient appropriate because:Inpatient level of care appropriate due to severity of illness   Dispo: The Clark is from: Home              Anticipated d/c is to: Home vs SNF               Clark currently is medically stable to d/c.   Difficult to place Clark Yes    Consultants: None  Procedures:Npne  Antimicrobials:  Anti-infectives (From admission, onward)   None      Subjective:  Clark seen and examined at bedside this morning.  Comfortable.  Hemodynamically stable.  Denies any complaints  Objective: Vitals:   03/26/21 1316 03/26/21 2008 03/27/21 0439 03/27/21 0440  BP: (!) 134/98 (!) 141/88 132/84   Pulse: 97 88 92   Resp: 20 18 18    Temp: 98.1 F (36.7 C) 97.8 F (36.6 C) 97.8 F (36.6 C)   TempSrc:      SpO2: 97% 98% 97%   Weight:    84 kg  Height:        Intake/Output Summary (Last 24 hours) at 03/27/2021 0821 Last data filed at 03/26/2021 1836 Gross per 24 hour  Intake 948 ml  Output --  Net 948 ml   Filed Weights   03/24/21 0500 03/26/21 0500 03/27/21 0440  Weight: 81.1 kg 81.2 kg 84 kg    Examination:  General exam: Comfortable, sitting on the edge of the bed, denies any complaints, alert and awake    Data Reviewed: I have personally reviewed following labs and imaging studies  CBC: Alvin results for input(s): WBC, NEUTROABS, HGB, HCT, MCV, PLT in the last 168 hours. Basic Metabolic Panel: Alvin results for input(s): NA, K, CL, CO2, GLUCOSE, BUN, CREATININE, CALCIUM, MG, PHOS in the last 168 hours. GFR: Estimated Creatinine Clearance: 82.9 mL/min (by C-G formula based  on SCr of 0.96 mg/dL). Liver Function Tests: Alvin results for input(s): AST, ALT, ALKPHOS, BILITOT, PROT, ALBUMIN in the last 168 hours. Alvin results for input(s): LIPASE, AMYLASE in the last 168 hours. Alvin results for input(s): AMMONIA in the last 168 hours. Coagulation Profile: Alvin results for input(s): INR, PROTIME in the last 168 hours. Cardiac Enzymes: Alvin results for input(s): CKTOTAL, CKMB, CKMBINDEX, TROPONINI in the last 168 hours. BNP (last 3 results) Alvin results for input(s): PROBNP in the last 8760 hours. HbA1C: Alvin results for input(s): HGBA1C in the  last 72 hours. CBG: Alvin results for input(s): GLUCAP in the last 168 hours. Lipid Profile: Alvin results for input(s): CHOL, HDL, LDLCALC, TRIG, CHOLHDL, LDLDIRECT in the last 72 hours. Thyroid Function Tests: Alvin results for input(s): TSH, T4TOTAL, FREET4, T3FREE, THYROIDAB in the last 72 hours. Anemia Panel: Alvin results for input(s): VITAMINB12, FOLATE, FERRITIN, TIBC, IRON, RETICCTPCT in the last 72 hours. Sepsis Labs: Alvin results for input(s): PROCALCITON, LATICACIDVEN in the last 168 hours.  Alvin results found for this or any previous visit (from the past 240 hour(s)).       Radiology Studies: Alvin results found.      Scheduled Meds: . carvedilol  6.25 mg Oral BID WC  . enoxaparin (LOVENOX) injection  40 mg Subcutaneous Q24H  . feeding supplement  237 mL Oral TID BM  . folic acid  1 mg Oral Daily  . multivitamin with minerals  1 tablet Oral Daily  . QUEtiapine  25 mg Oral BID  . thiamine  100 mg Oral Daily  . vitamin B-12  1,000 mcg Oral Daily   Continuous Infusions:   LOS: 89 days    Time spent:15 mins. More than 50% of that time was spent in counseling and/or coordination of care.      Burnadette Pop, MD [facility] P6/04/2021, 8:21 AM

## 2021-03-27 NOTE — TOC Progression Note (Addendum)
Transition of Care [facility]) - Progression Note    Patient Details  Name: Alvin Clark MRN: [id number] Date of Birth: [birth year]  Transition of [facility]) CM/SW Contact  Darleene Cleaver, [place] Phone Number: 03/27/2021, 3:58 PM  Clinical Narrative:     CSW spoke to [place] at [employer] [phone].  Per Johnny Bridge, she closed the case and sent it to the [employer] [phone].  Per Johnny Bridge, they are going to be the ones to make the final decision on disability.  Johnny Bridge stated that she can not share any other information about decision and to contact family to see what they have been told.  CSW to follow up at a later time.   Expected Discharge Plan: Assisted Living (versus family care home) Barriers to Discharge: Financial Resources,Inadequate or no insurance,Other (must enter comment) (No bed availability at an ALF or Memory care facility.)  Expected Discharge Plan and Services Expected Discharge Plan: Assisted Living (versus family care home) In-house Referral: Clinical Social Work,Financial Counselor     Living arrangements for the past 2 months: No permanent address                                       Social Determinants of Health (SDOH) Interventions    Readmission Risk Interventions No flowsheet data found.

## 2021-03-28 NOTE — Plan of Care (Signed)
  Problem: Clinical Measurements: Goal: Ability to maintain clinical measurements within normal limits will improve Outcome: Progressing Goal: Diagnostic test results will improve Outcome: Not Applicable   Problem: Nutrition: Goal: Adequate nutrition will be maintained Outcome: Adequate for Discharge

## 2021-03-28 NOTE — Progress Notes (Signed)
PROGRESS NOTE    Alvin Clark  DGL:[id number] DOB: [birth year] DOA: 12/27/2020 PCP: Patient, No Pcp Per (Inactive)   Chief Complain: Confusion  Brief Narrative: Patient is a 60 year old male with history of chronic alcohol abuse who was initially admitted on 12/27/2020 after he was found frail, confused and disoriented when [facility] police entered his home to evict him.  On presentation he was confused.  He was started on IV fluids, thiamine.  He had prolonged hospitalization because of unsafe discharge and no place to go.  Pending placement.  Assessment & Plan:   Principal Problem:   AKI (acute kidney injury) (HCC) Active Problems:   Confusion   Leukocytosis   Blurry vision, bilateral   Transaminitis   Constipation   Withdrawal symptoms, alcohol (HCC)   Protein-calorie malnutrition, severe   Acute metabolic encephalopathy   Hypokalemia   Hypomagnesemia   Hypophosphatemia   Fall   Debility   Low vitamin B12 level   Chronic alcohol use   Metabolic encephalopathy/confusion: On presentation he was confused, still intermittently confused. 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  Continue thiamine, multivitamin, folic acid, vitamin B12.  Monitor mental status. CT head did not show any acute intracranial normalities.  Severe protein calorie malnutrition/failure to thrive: Secondary to chronic alcohol abuse.  Nutrition team is following here.  Hypertension: Currently blood pressure stable.  Continue Coreg  AKI: Resolved  Alcoholic hepatitis: Resolved    Nutrition Problem: Severe Malnutrition Etiology: chronic illness (chronic alcoholism)      DVT prophylaxis: Lovenox Code Status: Full Family Communication: None at bedside Status is: Inpatient  Remains inpatient appropriate because:Inpatient level of care appropriate due to severity of illness   Dispo: The patient is from: Home              Anticipated  d/c is to: ALF              Patient currently is medically stable to d/c.   Difficult to place patient Yes    Consultants: None  Procedures:Npne  Antimicrobials:  Anti-infectives (From admission, onward)   None      Subjective:  Patient seen and examined at the bedside this morning.  Denies any complaints, sleeping on the bed.  Became very unhappy when I tried to examine him.  Objective: Vitals:   03/27/21 1405 03/27/21 1958 03/28/21 0426 03/28/21 0428  BP: (!) 152/91 133/87 122/85   Pulse: 94 99 95   Resp: 20 18 18    Temp: 98.4 F (36.9 C) 98.1 F (36.7 C) (!) 97.5 F (36.4 C)   TempSrc: Oral     SpO2: 98% 97% 96%   Weight:    80.6 kg  Height:        Intake/Output Summary (Last 24 hours) at 03/28/2021 0839 Last data filed at 03/27/2021 0904 Gross per 24 hour  Intake 473 ml  Output --  Net 473 ml   Filed Weights   03/26/21 0500 03/27/21 0440 03/28/21 0428  Weight: 81.2 kg 84 kg 80.6 kg    Examination:  General exam: Comfortable, lying on bed, declined physical examination   Data Reviewed: I have personally reviewed following labs and imaging studies  CBC: No results for input(s): WBC, NEUTROABS, HGB, HCT, MCV, PLT in the last 168 hours. Basic Metabolic Panel: No results for input(s): NA, K, CL, CO2, GLUCOSE, BUN, CREATININE, CALCIUM, MG, PHOS in the last 168 hours. GFR: Estimated Creatinine Clearance: 82.9 mL/min (by  C-G formula based on SCr of 0.96 mg/dL). Liver Function Tests: No results for input(s): AST, ALT, ALKPHOS, BILITOT, PROT, ALBUMIN in the last 168 hours. No results for input(s): LIPASE, AMYLASE in the last 168 hours. No results for input(s): AMMONIA in the last 168 hours. Coagulation Profile: No results for input(s): INR, PROTIME in the last 168 hours. Cardiac Enzymes: No results for input(s): CKTOTAL, CKMB, CKMBINDEX, TROPONINI in the last 168 hours. BNP (last 3 results) No results for input(s): PROBNP in the last 8760 hours. HbA1C: No  results for input(s): HGBA1C in the last 72 hours. CBG: No results for input(s): GLUCAP in the last 168 hours. Lipid Profile: No results for input(s): CHOL, HDL, LDLCALC, TRIG, CHOLHDL, LDLDIRECT in the last 72 hours. Thyroid Function Tests: No results for input(s): TSH, T4TOTAL, FREET4, T3FREE, THYROIDAB in the last 72 hours. Anemia Panel: No results for input(s): VITAMINB12, FOLATE, FERRITIN, TIBC, IRON, RETICCTPCT in the last 72 hours. Sepsis Labs: No results for input(s): PROCALCITON, LATICACIDVEN in the last 168 hours.  No results found for this or any previous visit (from the past 240 hour(s)).       Radiology Studies: No results found.      Scheduled Meds: . carvedilol  6.25 mg Oral BID WC  . enoxaparin (LOVENOX) injection  40 mg Subcutaneous Q24H  . feeding supplement  237 mL Oral TID BM  . folic acid  1 mg Oral Daily  . multivitamin with minerals  1 tablet Oral Daily  . QUEtiapine  25 mg Oral BID  . thiamine  100 mg Oral Daily  . vitamin B-12  1,000 mcg Oral Daily   Continuous Infusions:   LOS: 90 days    Time spent:15 mins. More than 50% of that time was spent in counseling and/or coordination of care.      Burnadette Pop, MD [facility] P6/05/2021, 8:39 AM

## 2021-03-29 NOTE — Plan of Care (Signed)
  Problem: Clinical Measurements: Goal: Will remain free from infection Outcome: Progressing Goal: Respiratory complications will improve Outcome: Not Applicable Goal: Cardiovascular complication will be avoided Outcome: Progressing   Problem: Nutrition: Goal: Adequate nutrition will be maintained Outcome: Completed/Met

## 2021-03-29 NOTE — Progress Notes (Signed)
PROGRESS NOTE    Alvin Clark  GLO:[id number] DOB: [birth year] DOA: 12/27/2020 PCP: Patient, No Pcp Per (Inactive)   Brief Narrative:   Patient is a 60 year old male with history of chronic alcohol abuse who was initially admitted on 12/27/2020 after he was found frail, confused and disoriented when [facility] police entered his home to evict him.  On presentation he was confused.  He was started on IV fluids, thiamine.  He had prolonged hospitalization because of unsafe discharge and no place to go.  Pending placement. No changes for today.   Assessment & Plan: Metabolic encephalopathy/confusion     - On presentation he was confused, still intermittently confused.     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      - Continue thiamine, multivitamin, folic acid, vitamin B12.     - CT head did not show any acute intracranial normalities.     - no changes for today   Severe protein calorie malnutrition/failure to thrive     - Secondary to chronic alcohol abuse     - Nutrition team is following here.     - continue as above   Hypertension     - Continue Coreg; titrate as necessary   AKI     - Resolved   Alcoholic hepatitis     - Resolved  DVT prophylaxis: lovenox Code Status: FULL Family Communication: None at bedside   Status is: Inpatient  Remains inpatient appropriate because:Unsafe d/c plan  Dispo: The patient is from: Home              Anticipated d/c is to: Group home              Patient currently is medically stable to d/c.   Difficult to place patient Yes  Consultants:  None  Procedures:  None  Antimicrobials:  None currently   Subjective: "Man, sleep is nice."  Objective: Vitals:   03/28/21 1329 03/28/21 2004 03/29/21 0402 03/29/21 0403  BP: 140/84 138/84  (!) 131/92  Pulse: 93 95  92  Resp: 16 18  18   Temp: 98.2 F (36.8 C) 98.6 F (37 C)  97.7 F (36.5 C)  TempSrc: Oral Oral    SpO2:  97% 95%  96%  Weight:   82.6 kg   Height:       No intake or output data in the 24 hours ending 03/29/21 0830 Filed Weights   03/27/21 0440 03/28/21 0428 03/29/21 0402  Weight: 84 kg 80.6 kg 82.6 kg    Examination:  General: 60 y.o. male resting in bed in NAD Eyes: PERRL, normal sclera ENMT: Nares patent w/o discharge, orophaynx clear, dentition normal, ears w/o discharge/lesions/ulcers Neck: Supple, trachea midline Cardiovascular: RRR, +S1, S2, no m/g/r, equal pulses throughout Respiratory: CTABL, no w/r/r, normal WOB GI: BS+, NDNT, no masses noted, no organomegaly noted MSK: No e/c/c Skin: No rashes, bruises, ulcerations noted Neuro: A&O x 3, no focal deficits Psyc: Appropriate interaction and affect, calm/cooperative   Data Reviewed: I have personally reviewed following labs and imaging studies.  CBC: No results for input(s): WBC, NEUTROABS, HGB, HCT, MCV, PLT in the last 168 hours. Basic Metabolic Panel: No results for input(s): NA, K, CL, CO2, GLUCOSE, BUN, CREATININE, CALCIUM, MG, PHOS in the last 168 hours. GFR: Estimated Creatinine Clearance: 82.9 mL/min (by C-G formula based on SCr of 0.96 mg/dL). Liver Function Tests: No results for input(s): AST, ALT, ALKPHOS, BILITOT,  PROT, ALBUMIN in the last 168 hours. No results for input(s): LIPASE, AMYLASE in the last 168 hours. No results for input(s): AMMONIA in the last 168 hours. Coagulation Profile: No results for input(s): INR, PROTIME in the last 168 hours. Cardiac Enzymes: No results for input(s): CKTOTAL, CKMB, CKMBINDEX, TROPONINI in the last 168 hours. BNP (last 3 results) No results for input(s): PROBNP in the last 8760 hours. HbA1C: No results for input(s): HGBA1C in the last 72 hours. CBG: No results for input(s): GLUCAP in the last 168 hours. Lipid Profile: No results for input(s): CHOL, HDL, LDLCALC, TRIG, CHOLHDL, LDLDIRECT in the last 72 hours. Thyroid Function Tests: No results for input(s): TSH,  T4TOTAL, FREET4, T3FREE, THYROIDAB in the last 72 hours. Anemia Panel: No results for input(s): VITAMINB12, FOLATE, FERRITIN, TIBC, IRON, RETICCTPCT in the last 72 hours. Sepsis Labs: No results for input(s): PROCALCITON, LATICACIDVEN in the last 168 hours.  No results found for this or any previous visit (from the past 240 hour(s)).    Radiology Studies: No results found.   Scheduled Meds:  carvedilol  6.25 mg Oral BID WC   enoxaparin (LOVENOX) injection  40 mg Subcutaneous Q24H   feeding supplement  237 mL Oral TID BM   folic acid  1 mg Oral Daily   multivitamin with minerals  1 tablet Oral Daily   QUEtiapine  25 mg Oral BID   thiamine  100 mg Oral Daily   vitamin B-12  1,000 mcg Oral Daily   Continuous Infusions:   LOS: 91 days    Time spent: 15 minutes   Teddy Spike, DO [facility]  If 7PM-7AM, please contact night-coverage [email or web address] 03/29/2021, 8:30 AM

## 2021-03-30 LAB — CBC WITH DIFFERENTIAL/PLATELET
Abs Immature Granulocytes: 0.04 10*3/uL (ref 0.00–0.07)
Basophils Absolute: 0.1 10*3/uL (ref 0.0–0.1)
Basophils Relative: 1 %
Eosinophils Absolute: 0.4 10*3/uL (ref 0.0–0.5)
Eosinophils Relative: 4 %
HCT: 44.7 % (ref 39.0–52.0)
Hemoglobin: 14.9 g/dL (ref 13.0–17.0)
Immature Granulocytes: 0 %
Lymphocytes Relative: 34 %
Lymphs Abs: 3.3 10*3/uL (ref 0.7–4.0)
MCH: 31.3 pg (ref 26.0–34.0)
MCHC: 33.3 g/dL (ref 30.0–36.0)
MCV: 93.9 fL (ref 80.0–100.0)
Monocytes Absolute: 1.2 10*3/uL — ABNORMAL HIGH (ref 0.1–1.0)
Monocytes Relative: 13 %
Neutro Abs: 4.7 10*3/uL (ref 1.7–7.7)
Neutrophils Relative %: 48 %
Platelets: 214 10*3/uL (ref 150–400)
RBC: 4.76 MIL/uL (ref 4.22–5.81)
RDW: 11.9 % (ref 11.5–15.5)
WBC: 9.7 10*3/uL (ref 4.0–10.5)
nRBC: 0 % (ref 0.0–0.2)

## 2021-03-30 LAB — BASIC METABOLIC PANEL
Anion gap: 9 (ref 5–15)
BUN: 20 mg/dL (ref 6–20)
CO2: 25 mmol/L (ref 22–32)
Calcium: 9.8 mg/dL (ref 8.9–10.3)
Chloride: 102 mmol/L (ref 98–111)
Creatinine, Ser: 1.03 mg/dL (ref 0.61–1.24)
GFR, Estimated: >60 mL/min (ref >60)
Glucose, Bld: 92 mg/dL (ref 70–99)
Potassium: 4.1 mmol/L (ref 3.5–5.1)
Sodium: 136 mmol/L (ref 135–145)

## 2021-03-30 NOTE — Progress Notes (Signed)
PROGRESS NOTE    Alvin Clark  XTG:[id number] DOB: 1961/01/15 DOA: 12/27/2020 PCP: Patient, No Pcp Per (Inactive)   Brief Narrative:   Patient is a 60 year old male with history of chronic alcohol abuse who was initially admitted on 12/27/2020 after he was found frail, confused and disoriented when [facility] police entered his home to evict him.  On presentation he was confused.  He was started on IV fluids, thiamine.  He had prolonged hospitalization because of unsafe discharge and no place to go.  Pending placement. No changes for today.  03/30/2021:  Patient seen.  No new complaints.  Patient is enjoying his meal.   Assessment & Plan: Metabolic encephalopathy/confusion     - On presentation he was confused, still intermittently confused.     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      - Continue thiamine, multivitamin, folic acid, vitamin B12.     - CT head did not show any acute intracranial normalities. 03/30/21: No new changes.    Severe protein calorie malnutrition/failure to thrive     - Secondary to chronic alcohol abuse     - Nutrition team is following here.     - continue as above   Hypertension     - Continue Coreg; titrate as necessary   AKI     - Resolved   Alcoholic hepatitis     - Resolved  DVT prophylaxis: lovenox Code Status: FULL Family Communication: None at bedside   Status is: Inpatient  Remains inpatient appropriate because:Unsafe d/c plan  Dispo: The patient is from: Home              Anticipated d/c is to: Group home              Patient currently is medically stable to d/c.   Difficult to place patient Yes  Consultants:  None  Procedures:  None  Antimicrobials:  None currently   Subjective: No new complaints.  Objective: Vitals:   03/29/21 1352 03/29/21 1945 03/30/21 0409 03/30/21 0500  BP: (!) 141/84 130/85 111/79   Pulse: 93 90 87   Resp: 16 20 18    Temp: 97.7 F  (36.5 C) 98.2 F (36.8 C) 98 F (36.7 C)   TempSrc: Oral Oral Oral   SpO2: 96% 97% 97%   Weight:    83.4 kg  Height:        Intake/Output Summary (Last 24 hours) at 03/30/2021 1233 Last data filed at 03/30/2021 1211 Gross per 24 hour  Intake 840 ml  Output --  Net 840 ml   Filed Weights   03/28/21 0428 03/29/21 0402 03/30/21 0500  Weight: 80.6 kg 82.6 kg 83.4 kg    Examination:  General: 60 y.o. male resting in bed in NAD Eyes: PERRL, normal sclera ENMT: Nares patent w/o discharge, orophaynx clear, dentition normal, ears w/o discharge/lesions/ulcers Neck: Supple, trachea midline Cardiovascular: RRR, +S1, S2, no m/g/r, equal pulses throughout Respiratory: CTABL, no w/r/r, normal WOB GI: BS+, NDNT, no masses noted, no organomegaly noted MSK: No e/c/c Skin: No rashes, bruises, ulcerations noted Neuro: A&O x 3, no focal deficits Psyc: Appropriate interaction and affect, calm/cooperative   Data Reviewed: I have personally reviewed following labs and imaging studies.  CBC: Recent Labs  Lab 03/30/21 0505  WBC 9.7  NEUTROABS 4.7  HGB 14.9  HCT 44.7  MCV 93.9  PLT 214   Basic Metabolic Panel: Recent Labs  Lab  03/30/21 0505  NA 136  K 4.1  CL 102  CO2 25  GLUCOSE 92  BUN 20  CREATININE 1.03  CALCIUM 9.8   GFR: Estimated Creatinine Clearance: 77.2 mL/min (by C-G formula based on SCr of 1.03 mg/dL). Liver Function Tests: No results for input(s): AST, ALT, ALKPHOS, BILITOT, PROT, ALBUMIN in the last 168 hours. No results for input(s): LIPASE, AMYLASE in the last 168 hours. No results for input(s): AMMONIA in the last 168 hours. Coagulation Profile: No results for input(s): INR, PROTIME in the last 168 hours. Cardiac Enzymes: No results for input(s): CKTOTAL, CKMB, CKMBINDEX, TROPONINI in the last 168 hours. BNP (last 3 results) No results for input(s): PROBNP in the last 8760 hours. HbA1C: No results for input(s): HGBA1C in the last 72 hours. CBG: No  results for input(s): GLUCAP in the last 168 hours. Lipid Profile: No results for input(s): CHOL, HDL, LDLCALC, TRIG, CHOLHDL, LDLDIRECT in the last 72 hours. Thyroid Function Tests: No results for input(s): TSH, T4TOTAL, FREET4, T3FREE, THYROIDAB in the last 72 hours. Anemia Panel: No results for input(s): VITAMINB12, FOLATE, FERRITIN, TIBC, IRON, RETICCTPCT in the last 72 hours. Sepsis Labs: No results for input(s): PROCALCITON, LATICACIDVEN in the last 168 hours.  No results found for this or any previous visit (from the past 240 hour(s)).    Radiology Studies: No results found.   Scheduled Meds:  carvedilol  6.25 mg Oral BID WC   enoxaparin (LOVENOX) injection  40 mg Subcutaneous Q24H   feeding supplement  237 mL Oral TID BM   folic acid  1 mg Oral Daily   multivitamin with minerals  1 tablet Oral Daily   QUEtiapine  25 mg Oral BID   thiamine  100 mg Oral Daily   vitamin B-12  1,000 mcg Oral Daily   Continuous Infusions:   LOS: 92 days    Time spent: 15 minutes   Barnetta Chapel, MD [facility]  If 7PM-7AM, please contact night-coverage [email or web address] 03/30/2021, 12:33 PM

## 2021-03-31 NOTE — Progress Notes (Signed)
PROGRESS NOTE    Alvin Clark  WEX:[id number] DOB: 16-Dec-1960 DOA: 12/27/2020 PCP: Patient, No Pcp Per (Inactive)   Brief Narrative:   Patient is a 60 year old male with history of chronic alcohol abuse who was initially admitted on 12/27/2020 after he was found frail, confused and disoriented when [facility] police entered his home to evict him.  On presentation he was confused.  He was started on IV fluids, thiamine.  He had prolonged hospitalization because of unsafe discharge and no place to go.  Pending placement. No changes for today.  03/30/2021:  Patient seen.  No new complaints.  Patient is enjoying his meal. 03/31/2021: Patient seen.  No new changes.   Assessment & Plan: Metabolic encephalopathy/confusion     - On presentation he was confused, still intermittently confused.     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      - Continue thiamine, multivitamin, folic acid, vitamin B12.     - CT head did not show any acute intracranial normalities. 03/30/21: No new changes.    Severe protein calorie malnutrition/failure to thrive     - Secondary to chronic alcohol abuse     - Nutrition team is following here.     - continue as above   Hypertension     - Continue Coreg; titrate as necessary   AKI     - Resolved   Alcoholic hepatitis     - Resolved  DVT prophylaxis: lovenox Code Status: FULL Family Communication: None at bedside   Status is: Inpatient  Remains inpatient appropriate because:Unsafe d/c plan  Dispo: The patient is from: Home              Anticipated d/c is to: Group home              Patient currently is medically stable to d/c.   Difficult to place patient Yes  Consultants:  None  Procedures:  None  Antimicrobials:  None currently   Subjective: No new complaints.  Objective: Vitals:   03/30/21 2029 03/31/21 0418 03/31/21 0449 03/31/21 1438  BP: (!) 136/91 129/89  123/88  Pulse: 98  88  95  Resp: 20 16  19   Temp: 98.4 F (36.9 C) 98.2 F (36.8 C)  (!) 97.1 F (36.2 C)  TempSrc: Oral Oral    SpO2: 93% 96%  95%  Weight:   82 kg   Height:        Intake/Output Summary (Last 24 hours) at 03/31/2021 1726 Last data filed at 03/31/2021 1236 Gross per 24 hour  Intake 1196 ml  Output --  Net 1196 ml    Filed Weights   03/29/21 0402 03/30/21 0500 03/31/21 0449  Weight: 82.6 kg 83.4 kg 82 kg    Examination:  General: 60 y.o. male resting in bed in NAD Eyes: PERRL, normal sclera ENMT: Nares patent w/o discharge, orophaynx clear, dentition normal, ears w/o discharge/lesions/ulcers Neck: Supple, trachea midline Cardiovascular: RRR, +S1, S2, no m/g/r, equal pulses throughout Respiratory: CTABL, no w/r/r, normal WOB GI: BS+, NDNT, no masses noted, no organomegaly noted MSK: No e/c/c Skin: No rashes, bruises, ulcerations noted Neuro: A&O x 3, no focal deficits Psyc: Appropriate interaction and affect, calm/cooperative   Data Reviewed: I have personally reviewed following labs and imaging studies.  CBC: Recent Labs  Lab 03/30/21 0505  WBC 9.7  NEUTROABS 4.7  HGB 14.9  HCT 44.7  MCV 93.9  PLT 214  Basic Metabolic Panel: Recent Labs  Lab 03/30/21 0505  NA 136  K 4.1  CL 102  CO2 25  GLUCOSE 92  BUN 20  CREATININE 1.03  CALCIUM 9.8    GFR: Estimated Creatinine Clearance: 77.2 mL/min (by C-G formula based on SCr of 1.03 mg/dL). Liver Function Tests: No results for input(s): AST, ALT, ALKPHOS, BILITOT, PROT, ALBUMIN in the last 168 hours. No results for input(s): LIPASE, AMYLASE in the last 168 hours. No results for input(s): AMMONIA in the last 168 hours. Coagulation Profile: No results for input(s): INR, PROTIME in the last 168 hours. Cardiac Enzymes: No results for input(s): CKTOTAL, CKMB, CKMBINDEX, TROPONINI in the last 168 hours. BNP (last 3 results) No results for input(s): PROBNP in the last 8760 hours. HbA1C: No results for  input(s): HGBA1C in the last 72 hours. CBG: No results for input(s): GLUCAP in the last 168 hours. Lipid Profile: No results for input(s): CHOL, HDL, LDLCALC, TRIG, CHOLHDL, LDLDIRECT in the last 72 hours. Thyroid Function Tests: No results for input(s): TSH, T4TOTAL, FREET4, T3FREE, THYROIDAB in the last 72 hours. Anemia Panel: No results for input(s): VITAMINB12, FOLATE, FERRITIN, TIBC, IRON, RETICCTPCT in the last 72 hours. Sepsis Labs: No results for input(s): PROCALCITON, LATICACIDVEN in the last 168 hours.  No results found for this or any previous visit (from the past 240 hour(s)).    Radiology Studies: No results found.   Scheduled Meds:  carvedilol  6.25 mg Oral BID WC   enoxaparin (LOVENOX) injection  40 mg Subcutaneous Q24H   feeding supplement  237 mL Oral TID BM   folic acid  1 mg Oral Daily   multivitamin with minerals  1 tablet Oral Daily   QUEtiapine  25 mg Oral BID   thiamine  100 mg Oral Daily   vitamin B-12  1,000 mcg Oral Daily   Continuous Infusions:   LOS: 93 days    Time spent: 15 minutes   Barnetta Chapel, MD [facility]  If 7PM-7AM, please contact night-coverage [email or web address] 03/31/2021, 5:26 PM

## 2021-04-01 NOTE — Progress Notes (Signed)
PROGRESS NOTE    Alvin Clark  FGH:[id number] DOB: [birth year] DOA: 12/27/2020 PCP: Patient, No Pcp Per (Inactive)   Brief Narrative:   Patient is a 60 year old male with history of chronic alcohol abuse who was initially admitted on 12/27/2020 after he was found frail, confused and disoriented when [facility] police entered his home to evict him.  On presentation he was confused.  He was started on IV fluids, thiamine.  He had prolonged hospitalization because of unsafe discharge and no place to go.  Pending placement. No changes for today.   Assessment & Plan: Metabolic encephalopathy/confusion     - On presentation he was confused, still intermittently confused.     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      - Continue thiamine, multivitamin, folic acid, vitamin B12.     - CT head did not show any acute intracranial normalities. 04/01/21: No new changes.    Severe protein calorie malnutrition/failure to thrive     - Secondary to chronic alcohol abuse     - Nutrition team is following here.     - continue as above   Hypertension     - Continue Coreg; titrate as necessary   AKI     - Resolved   Alcoholic hepatitis     - Resolved  DVT prophylaxis: lovenox Code Status: FULL Family Communication: None at bedside   Status is: Inpatient  Remains inpatient appropriate because:Unsafe d/c plan  Dispo: The patient is from: Home              Anticipated d/c is to: Group home              Patient currently is medically stable to d/c.   Difficult to place patient Yes  Consultants:  None  Procedures:  None  Antimicrobials:  None currently   Subjective: No new complaints.  Objective: Vitals:   03/31/21 2001 04/01/21 0500 04/01/21 0616 04/01/21 1408  BP: (!) 151/99  (!) 161/90 (!) 144/84  Pulse: 93  87 99  Resp: 16  16 17   Temp: 98.1 F (36.7 C)  (!) 97.5 F (36.4 C) 99 F (37.2 C)  TempSrc: Oral  Oral  Oral  SpO2: 96%  99% 95%  Weight:  79.8 kg    Height:        Intake/Output Summary (Last 24 hours) at 04/01/2021 1522 Last data filed at 03/31/2021 2100 Gross per 24 hour  Intake 247 ml  Output --  Net 247 ml    Filed Weights   03/30/21 0500 03/31/21 0449 04/01/21 0500  Weight: 83.4 kg 82 kg 79.8 kg    Examination:  General: 60 y.o. male resting in bed in NAD Eyes: PERRL, normal sclera ENMT: Nares patent w/o discharge, orophaynx clear, dentition normal, ears w/o discharge/lesions/ulcers Neck: Supple, trachea midline Cardiovascular: RRR, +S1, S2, no m/g/r, equal pulses throughout Respiratory: CTABL, no w/r/r, normal WOB GI: BS+, NDNT, no masses noted, no organomegaly noted MSK: No e/c/c Skin: No rashes, bruises, ulcerations noted Neuro: A&O x 3, no focal deficits Psyc: Appropriate interaction and affect, calm/cooperative   Data Reviewed: I have personally reviewed following labs and imaging studies.  CBC: Recent Labs  Lab 03/30/21 0505  WBC 9.7  NEUTROABS 4.7  HGB 14.9  HCT 44.7  MCV 93.9  PLT 214    Basic Metabolic Panel: Recent Labs  Lab 03/30/21 0505  NA 136  K 4.1  CL  102  CO2 25  GLUCOSE 92  BUN 20  CREATININE 1.03  CALCIUM 9.8    GFR: Estimated Creatinine Clearance: 77.2 mL/min (by C-G formula based on SCr of 1.03 mg/dL). Liver Function Tests: No results for input(s): AST, ALT, ALKPHOS, BILITOT, PROT, ALBUMIN in the last 168 hours. No results for input(s): LIPASE, AMYLASE in the last 168 hours. No results for input(s): AMMONIA in the last 168 hours. Coagulation Profile: No results for input(s): INR, PROTIME in the last 168 hours. Cardiac Enzymes: No results for input(s): CKTOTAL, CKMB, CKMBINDEX, TROPONINI in the last 168 hours. BNP (last 3 results) No results for input(s): PROBNP in the last 8760 hours. HbA1C: No results for input(s): HGBA1C in the last 72 hours. CBG: No results for input(s): GLUCAP in the last 168 hours. Lipid  Profile: No results for input(s): CHOL, HDL, LDLCALC, TRIG, CHOLHDL, LDLDIRECT in the last 72 hours. Thyroid Function Tests: No results for input(s): TSH, T4TOTAL, FREET4, T3FREE, THYROIDAB in the last 72 hours. Anemia Panel: No results for input(s): VITAMINB12, FOLATE, FERRITIN, TIBC, IRON, RETICCTPCT in the last 72 hours. Sepsis Labs: No results for input(s): PROCALCITON, LATICACIDVEN in the last 168 hours.  No results found for this or any previous visit (from the past 240 hour(s)).    Radiology Studies: No results found.   Scheduled Meds:  carvedilol  6.25 mg Oral BID WC   enoxaparin (LOVENOX) injection  40 mg Subcutaneous Q24H   feeding supplement  237 mL Oral TID BM   folic acid  1 mg Oral Daily   multivitamin with minerals  1 tablet Oral Daily   QUEtiapine  25 mg Oral BID   thiamine  100 mg Oral Daily   vitamin B-12  1,000 mcg Oral Daily   Continuous Infusions:   LOS: 94 days    Time spent: 15 minutes   Barnetta Chapel, MD [facility]  If 7PM-7AM, please contact night-coverage [email or web address] 04/01/2021, 3:22 PM

## 2021-04-02 NOTE — Progress Notes (Signed)
PROGRESS NOTE    Alvin Clark  NIO:[id number] DOB: [birth year] DOA: 12/27/2020 PCP: Patient, No Pcp Per (Inactive)   Chief Complain: Confusion  Brief Narrative: Patient is a 60 year old male with history of chronic alcohol abuse who was initially admitted on 12/27/2020 after he was found frail, confused and disoriented when [facility] police entered his home to evict him.  On presentation he was confused.  He was started on IV fluids, thiamine.  He had prolonged hospitalization because of unsafe discharge and no place to go.  Pending placement.  Assessment & Plan:   Principal Problem:   AKI (acute kidney injury) (HCC) Active Problems:   Confusion   Leukocytosis   Blurry vision, bilateral   Transaminitis   Constipation   Withdrawal symptoms, alcohol (HCC)   Protein-calorie malnutrition, severe   Acute metabolic encephalopathy   Hypokalemia   Hypomagnesemia   Hypophosphatemia   Fall   Debility   Low vitamin B12 level   Chronic alcohol use   Metabolic encephalopathy/confusion: On presentation he was confused, still intermittently confused. 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  Continue thiamine, multivitamin, folic acid, vitamin B12.  Monitor mental status. CT head did not show any acute intracranial normalities.  Severe protein calorie malnutrition/failure to thrive: Secondary to chronic alcohol abuse.  Nutrition team is following here.  Hypertension: Currently blood pressure stable.  Continue Coreg  AKI: Resolved  Alcoholic hepatitis: Resolved    Nutrition Problem: Severe Malnutrition Etiology: chronic illness (chronic alcoholism)      DVT prophylaxis: Lovenox Code Status: Full Family Communication: None at bedside Status is: Inpatient  Remains inpatient appropriate because:Inpatient level of care appropriate due to severity of illness   Dispo: The patient is from: Home              Anticipated  d/c is to: ALF              Patient currently is medically stable to d/c.   Difficult to place patient Yes    Consultants: None  Procedures:Npne  Antimicrobials:  Anti-infectives (From admission, onward)    None       Subjective:  Patient seen at the bedside this morning.  Hemodynamically stable and comfortable.  Sitting on the chair  Objective: Vitals:   04/01/21 1408 04/01/21 2053 04/02/21 0449 04/02/21 0500  BP: (!) 144/84 136/85 138/89   Pulse: 99 85 80   Resp: 17 20 16    Temp: 99 F (37.2 C) 98.2 F (36.8 C) (!) 97.5 F (36.4 C)   TempSrc: Oral Oral Oral   SpO2: 95% 95% 97%   Weight:    81.2 kg  Height:       No intake or output data in the 24 hours ending 04/02/21 1243  Filed Weights   03/31/21 0449 04/01/21 0500 04/02/21 0500  Weight: 82 kg 79.8 kg 81.2 kg    Examination:  General exam: Comfortable, sitting on the chair  Data Reviewed: I have personally reviewed following labs and imaging studies  CBC: Recent Labs  Lab 03/30/21 0505  WBC 9.7  NEUTROABS 4.7  HGB 14.9  HCT 44.7  MCV 93.9  PLT 214   Basic Metabolic Panel: Recent Labs  Lab 03/30/21 0505  NA 136  K 4.1  CL 102  CO2 25  GLUCOSE 92  BUN 20  CREATININE 1.03  CALCIUM 9.8   GFR: Estimated Creatinine Clearance: 77.2 mL/min (by C-G formula based on SCr of  1.03 mg/dL). Liver Function Tests: No results for input(s): AST, ALT, ALKPHOS, BILITOT, PROT, ALBUMIN in the last 168 hours. No results for input(s): LIPASE, AMYLASE in the last 168 hours. No results for input(s): AMMONIA in the last 168 hours. Coagulation Profile: No results for input(s): INR, PROTIME in the last 168 hours. Cardiac Enzymes: No results for input(s): CKTOTAL, CKMB, CKMBINDEX, TROPONINI in the last 168 hours. BNP (last 3 results) No results for input(s): PROBNP in the last 8760 hours. HbA1C: No results for input(s): HGBA1C in the last 72 hours. CBG: No results for input(s): GLUCAP in the last 168  hours. Lipid Profile: No results for input(s): CHOL, HDL, LDLCALC, TRIG, CHOLHDL, LDLDIRECT in the last 72 hours. Thyroid Function Tests: No results for input(s): TSH, T4TOTAL, FREET4, T3FREE, THYROIDAB in the last 72 hours. Anemia Panel: No results for input(s): VITAMINB12, FOLATE, FERRITIN, TIBC, IRON, RETICCTPCT in the last 72 hours. Sepsis Labs: No results for input(s): PROCALCITON, LATICACIDVEN in the last 168 hours.  No results found for this or any previous visit (from the past 240 hour(s)).       Radiology Studies: No results found.      Scheduled Meds:  carvedilol  6.25 mg Oral BID WC   enoxaparin (LOVENOX) injection  40 mg Subcutaneous Q24H   feeding supplement  237 mL Oral TID BM   folic acid  1 mg Oral Daily   multivitamin with minerals  1 tablet Oral Daily   QUEtiapine  25 mg Oral BID   thiamine  100 mg Oral Daily   vitamin B-12  1,000 mcg Oral Daily   Continuous Infusions:   LOS: 95 days    Time spent:15 mins. More than 50% of that time was spent in counseling and/or coordination of care.      Burnadette Pop, MD [facility] P6/13/2022, 12:43 PM

## 2021-04-03 NOTE — Progress Notes (Signed)
PROGRESS NOTE    Alvin Clark  GLO:[id number] DOB: 1961/03/01 DOA: 12/27/2020 PCP: Patient, No Pcp Per (Inactive)   Chief Complain: Confusion  Brief Narrative: Patient is a 60 year old male with history of chronic alcohol abuse who was initially admitted on 12/27/2020 after he was found frail, confused and disoriented when [facility] police entered his home to evict him.  On presentation he was confused.  He was started on IV fluids, thiamine.  He had prolonged hospitalization because of unsafe discharge and no place to go.  Pending placement.  Assessment & Plan:   Principal Problem:   AKI (acute kidney injury) (HCC) Active Problems:   Confusion   Leukocytosis   Blurry vision, bilateral   Transaminitis   Constipation   Withdrawal symptoms, alcohol (HCC)   Protein-calorie malnutrition, severe   Acute metabolic encephalopathy   Hypokalemia   Hypomagnesemia   Hypophosphatemia   Fall   Debility   Low vitamin B12 level   Chronic alcohol use   Metabolic encephalopathy/confusion: On presentation he was confused, still intermittently confused. 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  Continue thiamine, multivitamin, folic acid, vitamin B12.  Monitor mental status. CT head did not show any acute intracranial normalities.  Severe protein calorie malnutrition/failure to thrive: Secondary to chronic alcohol abuse.  Nutrition team is following here.  Hypertension: Currently blood pressure stable.  Continue Coreg  AKI: Resolved  Alcoholic hepatitis: Resolved    Nutrition Problem: Severe Malnutrition Etiology: chronic illness (chronic alcoholism)      DVT prophylaxis: Lovenox Code Status: Full Family Communication: None at bedside Status is: Inpatient  Remains inpatient appropriate because:Inpatient level of care appropriate due to severity of illness   Dispo: The patient is from: Home              Anticipated  d/c is to: ALF              Patient currently is medically stable to d/c.   Difficult to place patient Yes    Consultants: None  Procedures:Npne  Antimicrobials:  Anti-infectives (From admission, onward)    None       Subjective:  Patient seen at the bedside this morning.  Comfortable.  Inside the room.  Denies any complaints  Objective: Vitals:   04/02/21 1403 04/02/21 1948 04/03/21 0500 04/03/21 0550  BP: (!) 143/89 121/90  134/77  Pulse: 85 83  82  Resp: 16 18  18   Temp: 98.1 F (36.7 C) 98.4 F (36.9 C)  97.9 F (36.6 C)  TempSrc: Oral Oral  Oral  SpO2: 95% 98%  95%  Weight:   82.7 kg   Height:       No intake or output data in the 24 hours ending 04/03/21 0823  Filed Weights   04/01/21 0500 04/02/21 0500 04/03/21 0500  Weight: 79.8 kg 81.2 kg 82.7 kg    Examination:  General exam: Comfortable, standing inside the room, walking around  Data Reviewed: I have personally reviewed following labs and imaging studies  CBC: Recent Labs  Lab 03/30/21 0505  WBC 9.7  NEUTROABS 4.7  HGB 14.9  HCT 44.7  MCV 93.9  PLT 214   Basic Metabolic Panel: Recent Labs  Lab 03/30/21 0505  NA 136  K 4.1  CL 102  CO2 25  GLUCOSE 92  BUN 20  CREATININE 1.03  CALCIUM 9.8   GFR: Estimated Creatinine Clearance: 77.2 mL/min (by C-G formula based on SCr  of 1.03 mg/dL). Liver Function Tests: No results for input(s): AST, ALT, ALKPHOS, BILITOT, PROT, ALBUMIN in the last 168 hours. No results for input(s): LIPASE, AMYLASE in the last 168 hours. No results for input(s): AMMONIA in the last 168 hours. Coagulation Profile: No results for input(s): INR, PROTIME in the last 168 hours. Cardiac Enzymes: No results for input(s): CKTOTAL, CKMB, CKMBINDEX, TROPONINI in the last 168 hours. BNP (last 3 results) No results for input(s): PROBNP in the last 8760 hours. HbA1C: No results for input(s): HGBA1C in the last 72 hours. CBG: No results for input(s): GLUCAP in the  last 168 hours. Lipid Profile: No results for input(s): CHOL, HDL, LDLCALC, TRIG, CHOLHDL, LDLDIRECT in the last 72 hours. Thyroid Function Tests: No results for input(s): TSH, T4TOTAL, FREET4, T3FREE, THYROIDAB in the last 72 hours. Anemia Panel: No results for input(s): VITAMINB12, FOLATE, FERRITIN, TIBC, IRON, RETICCTPCT in the last 72 hours. Sepsis Labs: No results for input(s): PROCALCITON, LATICACIDVEN in the last 168 hours.  No results found for this or any previous visit (from the past 240 hour(s)).       Radiology Studies: No results found.      Scheduled Meds:  carvedilol  6.25 mg Oral BID WC   enoxaparin (LOVENOX) injection  40 mg Subcutaneous Q24H   feeding supplement  237 mL Oral TID BM   folic acid  1 mg Oral Daily   multivitamin with minerals  1 tablet Oral Daily   QUEtiapine  25 mg Oral BID   thiamine  100 mg Oral Daily   vitamin B-12  1,000 mcg Oral Daily   Continuous Infusions:   LOS: 96 days    Time spent:15 mins. More than 50% of that time was spent in counseling and/or coordination of care.      Burnadette Pop, MD [facility] P6/14/2022, 8:23 AM

## 2021-04-04 MED ORDER — ENOXAPARIN SODIUM 40 MG/0.4ML IJ SOSY
40.0000 mg | PREFILLED_SYRINGE | INTRAMUSCULAR | Status: DC
  Administered 2021-04-04 – 2021-04-16 (×13): 40 mg via SUBCUTANEOUS
  Filled 2021-04-04 (×13): qty 0.4

## 2021-04-04 NOTE — Progress Notes (Signed)
PROGRESS NOTE    Alvin Clark  UXL:[id number] DOB: 04-23-1961 DOA: 12/27/2020 PCP: Alvin Clark, No Pcp Per (Inactive)   Chief Complain: Confusion  Brief Narrative: Alvin Clark is a 60 year old male with history of chronic alcohol abuse who was initially admitted on 12/27/2020 after he was found frail, confused and disoriented when [facility] police entered his home to evict him.  On presentation he was confused.  He was started on IV fluids, thiamine.  He had prolonged hospitalization because of unsafe discharge and no place to go.  Pending placement.  Assessment & Plan:   Principal Problem:   AKI (acute kidney injury) (HCC) Active Problems:   Confusion   Leukocytosis   Blurry vision, bilateral   Transaminitis   Constipation   Withdrawal symptoms, alcohol (HCC)   Protein-calorie malnutrition, severe   Acute metabolic encephalopathy   Hypokalemia   Hypomagnesemia   Hypophosphatemia   Fall   Debility   Low vitamin B12 level   Chronic alcohol use   Metabolic encephalopathy/confusion: On presentation he was confused, still intermittently confused. 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  Continue thiamine, multivitamin, folic acid, vitamin B12.  Monitor mental status. CT head did not show any acute intracranial normalities.  Severe protein calorie malnutrition/failure to thrive: Secondary to chronic alcohol abuse.  Nutrition team is following here.  Hypertension: Currently blood pressure stable.  Continue Coreg  AKI: Resolved  Alcoholic hepatitis: Resolved    Nutrition Problem: Severe Malnutrition Etiology: chronic illness (chronic alcoholism)      DVT prophylaxis: Lovenox Code Status: Full Family Communication: None at bedside Status is: Inpatient  Remains inpatient appropriate because:Inpatient level of care appropriate due to severity of illness   Dispo: The Alvin Clark is from: Home              Anticipated  d/c is to: ALF              Alvin Clark currently is medically stable to d/c.   Difficult to place Alvin Clark Yes    Consultants: None  Procedures:Npne  Antimicrobials:  Anti-infectives (From admission, onward)    None       Subjective:  Alvin Clark seen at the bedside this morning.  Sleeping, comfortable Objective: Vitals:   04/03/21 2007 04/04/21 0500 04/04/21 0624 04/04/21 0745  BP: (!) 130/93  (!) 133/98 132/89  Pulse: 75  84 83  Resp: 18  20   Temp: 97.9 F (36.6 C)  98.2 F (36.8 C)   TempSrc: Oral     SpO2: 98%  99%   Weight:  82.4 kg    Height:       No intake or output data in the 24 hours ending 04/04/21 0803  Filed Weights   04/02/21 0500 04/03/21 0500 04/04/21 0500  Weight: 81.2 kg 82.7 kg 82.4 kg    Examination:  General exam: Comfortable, sleeping  Data Reviewed: I have personally reviewed following labs and imaging studies  CBC: Recent Labs  Lab 03/30/21 0505  WBC 9.7  NEUTROABS 4.7  HGB 14.9  HCT 44.7  MCV 93.9  PLT 214   Basic Metabolic Panel: Recent Labs  Lab 03/30/21 0505  NA 136  K 4.1  CL 102  CO2 25  GLUCOSE 92  BUN 20  CREATININE 1.03  CALCIUM 9.8   GFR: Estimated Creatinine Clearance: 77.2 mL/min (by C-G formula based on SCr of 1.03 mg/dL). Liver Function Tests: No results for input(s): AST, ALT, ALKPHOS, BILITOT, PROT,  ALBUMIN in the last 168 hours. No results for input(s): LIPASE, AMYLASE in the last 168 hours. No results for input(s): AMMONIA in the last 168 hours. Coagulation Profile: No results for input(s): INR, PROTIME in the last 168 hours. Cardiac Enzymes: No results for input(s): CKTOTAL, CKMB, CKMBINDEX, TROPONINI in the last 168 hours. BNP (last 3 results) No results for input(s): PROBNP in the last 8760 hours. HbA1C: No results for input(s): HGBA1C in the last 72 hours. CBG: No results for input(s): GLUCAP in the last 168 hours. Lipid Profile: No results for input(s): CHOL, HDL, LDLCALC, TRIG, CHOLHDL,  LDLDIRECT in the last 72 hours. Thyroid Function Tests: No results for input(s): TSH, T4TOTAL, FREET4, T3FREE, THYROIDAB in the last 72 hours. Anemia Panel: No results for input(s): VITAMINB12, FOLATE, FERRITIN, TIBC, IRON, RETICCTPCT in the last 72 hours. Sepsis Labs: No results for input(s): PROCALCITON, LATICACIDVEN in the last 168 hours.  No results found for this or any previous visit (from the past 240 hour(s)).       Radiology Studies: No results found.      Scheduled Meds:  carvedilol  6.25 mg Oral BID WC   enoxaparin (LOVENOX) injection  40 mg Subcutaneous Q24H   feeding supplement  237 mL Oral TID BM   folic acid  1 mg Oral Daily   multivitamin with minerals  1 tablet Oral Daily   QUEtiapine  25 mg Oral BID   thiamine  100 mg Oral Daily   vitamin B-12  1,000 mcg Oral Daily   Continuous Infusions:   LOS: 97 days    Time spent:15 mins. More than 50% of that time was spent in counseling and/or coordination of care.      Burnadette Pop, MD [facility] P6/15/2022, 8:03 AM

## 2021-04-04 NOTE — TOC Progression Note (Addendum)
Transition of Care [facility]) - Progression Note    Patient Details  Name: Alvin Clark MRN: [id number] Date of Birth: [birth year]  Transition of Care [facility]) CM/SW Contact  Ida Rogue, [place] Phone Number: 04/04/2021, 8:27 AM  Clinical Narrative:   Spoke with Ms Libman, [phone], sister of patient.  She states about a week ago she heard from her niece who had heard from "Misty Stanley" at [employer].  No phone number nor other contact information were included.  Niece reports that she was told his application from a previous submission of over a year ago had been "approved," and that they needed something from the hospital.  She had no further information. TOC will continue to follow during the course of hospitalization.  Addendum:  Called sister back, left a message with the number for SS disability and asked her to call for further clarification and let me know.  Addendum II: Sister emailed me letter that would appear to indicate patient has been found eligible for MCD retro to March 1.  Supervisor informed.  Contacted Ms Wallis Bamberg with referral.    Expected Discharge Plan: Assisted Living (versus family care home) Barriers to Discharge: Financial Resources, Inadequate or no insurance, Other (must enter comment) (No bed availability at an ALF or Memory care facility.)  Expected Discharge Plan and Services Expected Discharge Plan: Assisted Living (versus family care home) In-house Referral: Clinical Social Work, Artist     Living arrangements for the past 2 months: No permanent address                                       Social Determinants of Health (SDOH) Interventions    Readmission Risk Interventions No flowsheet data found.

## 2021-04-05 DIAGNOSIS — N179 Acute kidney failure, unspecified: Secondary | ICD-10-CM | POA: Diagnosis not present

## 2021-04-05 MED ORDER — TUBERCULIN PPD 5 UNIT/0.1ML ID SOLN
5.0000 [IU] | Freq: Once | INTRADERMAL | Status: AC
  Administered 2021-04-05: 5 [IU] via INTRADERMAL
  Filled 2021-04-05: qty 0.1

## 2021-04-05 NOTE — TOC Progression Note (Signed)
Transition of Care [facility]) - Progression Note    Patient Details  Name: DUWARD ALLBRITTON MRN: [id number] Date of Birth: 1960-11-03  Transition [facility]) CM/SW Contact  Ida Rogue, [place] Phone Number: 04/05/2021, 10:49 AM  Clinical Narrative:   Ms Wallis Bamberg [phone], asked that I send her the FL2, contact information for sister. FAXed to [phone].  She also indicated she would need TB skin test for admission.  Requested MD to order. TOC will continue to follow during the course of hospitalization.     Expected Discharge Plan: Assisted Living (versus family care home) Barriers to Discharge: Financial Resources, Inadequate or no insurance, Other (must enter comment) (No bed availability at an ALF or Memory care facility.)  Expected Discharge Plan and Services Expected Discharge Plan: Assisted Living (versus family care home) In-house Referral: Clinical Social Work, Artist     Living arrangements for the past 2 months: No permanent address                                       Social Determinants of Health (SDOH) Interventions    Readmission Risk Interventions No flowsheet data found.

## 2021-04-05 NOTE — Progress Notes (Signed)
TB test administered in the left inner forearm at 1205 on 04/05/21. TB test to be read within 48 hours.

## 2021-04-05 NOTE — Progress Notes (Signed)
PROGRESS NOTE    Alvin Clark  PJA:[id number] DOB: June 18, 1961 DOA: 12/27/2020 PCP: Patient, No Pcp Per (Inactive)   Chief Complain: Confusion  Brief Narrative: Patient is a 60 year old male with history of chronic alcohol abuse who was initially admitted on 12/27/2020 after he was found frail, confused and disoriented when [facility] police entered his home to evict him.  On presentation he was confused.  He was started on IV fluids, thiamine.  He had prolonged hospitalization because of unsafe discharge and no place to go.  Pending placement.  Assessment & Plan:   Principal Problem:   AKI (acute kidney injury) (HCC) Active Problems:   Confusion   Leukocytosis   Blurry vision, bilateral   Transaminitis   Constipation   Withdrawal symptoms, alcohol (HCC)   Protein-calorie malnutrition, severe   Acute metabolic encephalopathy   Hypokalemia   Hypomagnesemia   Hypophosphatemia   Fall   Debility   Low vitamin B12 level   Chronic alcohol use   Metabolic encephalopathy/confusion: On presentation he was confused, still intermittently confused. 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  Continue thiamine, multivitamin, folic acid, vitamin B12.  Monitor mental status. CT head did not show any acute intracranial abnormalities.  Severe protein calorie malnutrition/failure to thrive: Secondary to chronic alcohol abuse.  Nutrition team is following here.  Hypertension: Currently blood pressure stable.  Continue Coreg  AKI: Resolved  Alcoholic hepatitis: Resolved    Nutrition Problem: Severe Malnutrition Etiology: chronic illness (chronic alcoholism)      DVT prophylaxis: Lovenox Code Status: Full Family Communication: None at bedside Status is: Inpatient  Remains inpatient appropriate because:Inpatient level of care appropriate due to severity of illness   Dispo: The patient is from: Home              Anticipated  d/c is to: ALF              Patient currently is medically stable to d/c.   Difficult to place patient Yes    Consultants: None  Procedures:Npne  Antimicrobials:  Anti-infectives (From admission, onward)    None       Subjective:  Patient seen and at the bedside this morning.  Not happy because his breakfast was not brought   Objective: Vitals:   04/04/21 0745 04/04/21 1356 04/04/21 1955 04/05/21 0443  BP: 132/89 134/89 (!) 147/91 120/84  Pulse: 83 91 84 81  Resp:  16 18 18   Temp:  98.2 F (36.8 C) 98.2 F (36.8 C) 98.1 F (36.7 C)  TempSrc:  Oral  Oral  SpO2:  96% 99% 99%  Weight:      Height:       No intake or output data in the 24 hours ending 04/05/21 1315  Filed Weights   04/02/21 0500 04/03/21 0500 04/04/21 0500  Weight: 81.2 kg 82.7 kg 82.4 kg    Examination:  General exam: Comfortable, sitting at the edge of the bed  Data Reviewed: I have personally reviewed following labs and imaging studies  CBC: Recent Labs  Lab 03/30/21 0505  WBC 9.7  NEUTROABS 4.7  HGB 14.9  HCT 44.7  MCV 93.9  PLT 214   Basic Metabolic Panel: Recent Labs  Lab 03/30/21 0505  NA 136  K 4.1  CL 102  CO2 25  GLUCOSE 92  BUN 20  CREATININE 1.03  CALCIUM 9.8   GFR: Estimated Creatinine Clearance: 77.2 mL/min (by C-G formula based on  SCr of 1.03 mg/dL). Liver Function Tests: No results for input(s): AST, ALT, ALKPHOS, BILITOT, PROT, ALBUMIN in the last 168 hours. No results for input(s): LIPASE, AMYLASE in the last 168 hours. No results for input(s): AMMONIA in the last 168 hours. Coagulation Profile: No results for input(s): INR, PROTIME in the last 168 hours. Cardiac Enzymes: No results for input(s): CKTOTAL, CKMB, CKMBINDEX, TROPONINI in the last 168 hours. BNP (last 3 results) No results for input(s): PROBNP in the last 8760 hours. HbA1C: No results for input(s): HGBA1C in the last 72 hours. CBG: No results for input(s): GLUCAP in the last 168  hours. Lipid Profile: No results for input(s): CHOL, HDL, LDLCALC, TRIG, CHOLHDL, LDLDIRECT in the last 72 hours. Thyroid Function Tests: No results for input(s): TSH, T4TOTAL, FREET4, T3FREE, THYROIDAB in the last 72 hours. Anemia Panel: No results for input(s): VITAMINB12, FOLATE, FERRITIN, TIBC, IRON, RETICCTPCT in the last 72 hours. Sepsis Labs: No results for input(s): PROCALCITON, LATICACIDVEN in the last 168 hours.  No results found for this or any previous visit (from the past 240 hour(s)).       Radiology Studies: No results found.      Scheduled Meds:  carvedilol  6.25 mg Oral BID WC   enoxaparin (LOVENOX) injection  40 mg Subcutaneous Q24H   feeding supplement  237 mL Oral TID BM   folic acid  1 mg Oral Daily   multivitamin with minerals  1 tablet Oral Daily   QUEtiapine  25 mg Oral BID   thiamine  100 mg Oral Daily   tuberculin  5 Units Intradermal Once   vitamin B-12  1,000 mcg Oral Daily   Continuous Infusions:   LOS: 98 days    Time spent:15 mins. More than 50% of that time was spent in counseling and/or coordination of care.      Burnadette Pop, MD [facility] P6/16/2022, 1:15 PM

## 2021-04-06 DIAGNOSIS — N179 Acute kidney failure, unspecified: Secondary | ICD-10-CM | POA: Diagnosis not present

## 2021-04-06 NOTE — Progress Notes (Signed)
PROGRESS NOTE    Alvin Clark  TDD:[id number] DOB: 06/26/1961 DOA: 12/27/2020 PCP: Patient, No Pcp Per (Inactive)   Chief Complain: Confusion  Brief Narrative: Patient is a 60 year old male with history of chronic alcohol abuse who was initially admitted on 12/27/2020 after he was found frail, confused and disoriented when [facility] police entered his home to evict him.  On presentation he was confused.  He was started on IV fluids, thiamine.  He had prolonged hospitalization because of unsafe discharge and no place to go.  Pending placement.  Assessment & Plan:   Principal Problem:   AKI (acute kidney injury) (HCC) Active Problems:   Confusion   Leukocytosis   Blurry vision, bilateral   Transaminitis   Constipation   Withdrawal symptoms, alcohol (HCC)   Protein-calorie malnutrition, severe   Acute metabolic encephalopathy   Hypokalemia   Hypomagnesemia   Hypophosphatemia   Fall   Debility   Low vitamin B12 level   Chronic alcohol use   Metabolic encephalopathy/confusion: On presentation he was confused, still intermittently confused. 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  Continue thiamine, multivitamin, folic acid, vitamin B12.  Monitor mental status. CT head did not show any acute intracranial abnormalities.  Severe protein calorie malnutrition/failure to thrive: Secondary to chronic alcohol abuse.  Nutrition team is following here.  Hypertension: Currently blood pressure stable.  Continue Coreg  AKI: Resolved  Alcoholic hepatitis: Resolved    Nutrition Problem: Severe Malnutrition Etiology: chronic illness (chronic alcoholism)      DVT prophylaxis: Lovenox Code Status: Full Family Communication: None at bedside Status is: Inpatient  Remains inpatient appropriate because:Inpatient level of care appropriate due to severity of illness   Dispo: The patient is from: Home              Anticipated  d/c is to: ALF              Patient currently is medically stable to d/c.   Difficult to place patient Yes    Consultants: None  Procedures:Npne  Antimicrobials:  Anti-infectives (From admission, onward)    None       Subjective:  Patient seen at the bedside.  No new complaints.  Comfortable.  Lying on bed   Objective: Vitals:   04/05/21 1415 04/05/21 2024 04/06/21 0439 04/06/21 0500  BP: 120/85 (!) 126/93 (!) 144/88   Pulse: 88 92 93   Resp: 18 18 18    Temp: 98.2 F (36.8 C) 98 F (36.7 C) 97.8 F (36.6 C)   TempSrc: Oral Oral Oral   SpO2: 97% 94% 96%   Weight:    80.6 kg  Height:        Intake/Output Summary (Last 24 hours) at 04/06/2021 1312 Last data filed at 04/06/2021 0830 Gross per 24 hour  Intake 712 ml  Output --  Net 712 ml    Filed Weights   04/03/21 0500 04/04/21 0500 04/06/21 0500  Weight: 82.7 kg 82.4 kg 80.6 kg    Examination:  General exam: comfortable,lying on bed  Data Reviewed: I have personally reviewed following labs and imaging studies  CBC: No results for input(s): WBC, NEUTROABS, HGB, HCT, MCV, PLT in the last 168 hours.  Basic Metabolic Panel: No results for input(s): NA, K, CL, CO2, GLUCOSE, BUN, CREATININE, CALCIUM, MG, PHOS in the last 168 hours.  GFR: Estimated Creatinine Clearance: 77.2 mL/min (by C-G formula based on SCr of 1.03 mg/dL). Liver Function Tests:  No results for input(s): AST, ALT, ALKPHOS, BILITOT, PROT, ALBUMIN in the last 168 hours. No results for input(s): LIPASE, AMYLASE in the last 168 hours. No results for input(s): AMMONIA in the last 168 hours. Coagulation Profile: No results for input(s): INR, PROTIME in the last 168 hours. Cardiac Enzymes: No results for input(s): CKTOTAL, CKMB, CKMBINDEX, TROPONINI in the last 168 hours. BNP (last 3 results) No results for input(s): PROBNP in the last 8760 hours. HbA1C: No results for input(s): HGBA1C in the last 72 hours. CBG: No results for input(s):  GLUCAP in the last 168 hours. Lipid Profile: No results for input(s): CHOL, HDL, LDLCALC, TRIG, CHOLHDL, LDLDIRECT in the last 72 hours. Thyroid Function Tests: No results for input(s): TSH, T4TOTAL, FREET4, T3FREE, THYROIDAB in the last 72 hours. Anemia Panel: No results for input(s): VITAMINB12, FOLATE, FERRITIN, TIBC, IRON, RETICCTPCT in the last 72 hours. Sepsis Labs: No results for input(s): PROCALCITON, LATICACIDVEN in the last 168 hours.  No results found for this or any previous visit (from the past 240 hour(s)).       Radiology Studies: No results found.      Scheduled Meds:  carvedilol  6.25 mg Oral BID WC   enoxaparin (LOVENOX) injection  40 mg Subcutaneous Q24H   feeding supplement  237 mL Oral TID BM   folic acid  1 mg Oral Daily   multivitamin with minerals  1 tablet Oral Daily   QUEtiapine  25 mg Oral BID   thiamine  100 mg Oral Daily   tuberculin  5 Units Intradermal Once   vitamin B-12  1,000 mcg Oral Daily   Continuous Infusions:   LOS: 99 days    Time spent:15 mins. More than 50% of that time was spent in counseling and/or coordination of care.      Burnadette Pop, MD [facility] P6/17/2022, 1:12 PM

## 2021-04-07 DIAGNOSIS — N179 Acute kidney failure, unspecified: Secondary | ICD-10-CM | POA: Diagnosis not present

## 2021-04-07 NOTE — Progress Notes (Signed)
PROGRESS NOTE    Alvin Clark  LEX:[id number] DOB: 1961-03-21 DOA: 12/27/2020 PCP: Patient, No Pcp Per (Inactive)   Chief Complain: Confusion  Brief Narrative: Patient is a 60 year old male with history of chronic alcohol abuse who was initially admitted on 12/27/2020 after he was found frail, confused and disoriented when [facility] police entered his home to evict him.  On presentation he was confused.  He was started on IV fluids, thiamine.  He had prolonged hospitalization because of unsafe discharge and no place to go.  Pending placement.  Assessment & Plan:   Principal Problem:   AKI (acute kidney injury) (HCC) Active Problems:   Confusion   Leukocytosis   Blurry vision, bilateral   Transaminitis   Constipation   Withdrawal symptoms, alcohol (HCC)   Protein-calorie malnutrition, severe   Acute metabolic encephalopathy   Hypokalemia   Hypomagnesemia   Hypophosphatemia   Fall   Debility   Low vitamin B12 level   Chronic alcohol use   Metabolic encephalopathy/confusion: On presentation he was confused, still intermittently confused. 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  Continue thiamine, multivitamin, folic acid, vitamin B12.  Monitor mental status. CT head did not show any acute intracranial abnormalities.  Severe protein calorie malnutrition/failure to thrive: Secondary to chronic alcohol abuse.  Nutrition team is following here.  Hypertension: Currently blood pressure stable.  Continue Coreg  AKI: Resolved  Alcoholic hepatitis: Resolved    Nutrition Problem: Severe Malnutrition Etiology: chronic illness (chronic alcoholism)      DVT prophylaxis: Lovenox Code Status: Full Family Communication: None at bedside Status is: Inpatient  Remains inpatient appropriate because:Inpatient level of care appropriate due to severity of illness   Dispo: The patient is from: Home              Anticipated  d/c is to: ALF              Patient currently is medically stable to d/c.   Difficult to place patient Yes    Consultants: None  Procedures:Npne  Antimicrobials:  Anti-infectives (From admission, onward)    None       Subjective:  Patient seen at bedside.  Comfortable.  Lying on bed   Objective: Vitals:   04/06/21 1323 04/06/21 2020 04/07/21 0412 04/07/21 0445  BP: (!) 134/91 (!) 138/98 133/90   Pulse: 94 96 92   Resp: 19 16 16    Temp: 98.1 F (36.7 C) 98.4 F (36.9 C) 98.5 F (36.9 C)   TempSrc:  Oral Oral   SpO2: 97% 96% 97%   Weight:    85.1 kg  Height:        Intake/Output Summary (Last 24 hours) at 04/07/2021 1058 Last data filed at 04/07/2021 0900 Gross per 24 hour  Intake 1128 ml  Output 1 ml  Net 1127 ml    Filed Weights   04/04/21 0500 04/06/21 0500 04/07/21 0445  Weight: 82.4 kg 80.6 kg 85.1 kg    Examination:  General exam: comfortable,lying on bed  Data Reviewed: I have personally reviewed following labs and imaging studies  CBC: No results for input(s): WBC, NEUTROABS, HGB, HCT, MCV, PLT in the last 168 hours.  Basic Metabolic Panel: No results for input(s): NA, K, CL, CO2, GLUCOSE, BUN, CREATININE, CALCIUM, MG, PHOS in the last 168 hours.  GFR: Estimated Creatinine Clearance: 83.6 mL/min (by C-G formula based on SCr of 1.03 mg/dL). Liver Function Tests: No results for input(s):  AST, ALT, ALKPHOS, BILITOT, PROT, ALBUMIN in the last 168 hours. No results for input(s): LIPASE, AMYLASE in the last 168 hours. No results for input(s): AMMONIA in the last 168 hours. Coagulation Profile: No results for input(s): INR, PROTIME in the last 168 hours. Cardiac Enzymes: No results for input(s): CKTOTAL, CKMB, CKMBINDEX, TROPONINI in the last 168 hours. BNP (last 3 results) No results for input(s): PROBNP in the last 8760 hours. HbA1C: No results for input(s): HGBA1C in the last 72 hours. CBG: No results for input(s): GLUCAP in the last 168  hours. Lipid Profile: No results for input(s): CHOL, HDL, LDLCALC, TRIG, CHOLHDL, LDLDIRECT in the last 72 hours. Thyroid Function Tests: No results for input(s): TSH, T4TOTAL, FREET4, T3FREE, THYROIDAB in the last 72 hours. Anemia Panel: No results for input(s): VITAMINB12, FOLATE, FERRITIN, TIBC, IRON, RETICCTPCT in the last 72 hours. Sepsis Labs: No results for input(s): PROCALCITON, LATICACIDVEN in the last 168 hours.  No results found for this or any previous visit (from the past 240 hour(s)).       Radiology Studies: No results found.      Scheduled Meds:  carvedilol  6.25 mg Oral BID WC   enoxaparin (LOVENOX) injection  40 mg Subcutaneous Q24H   feeding supplement  237 mL Oral TID BM   folic acid  1 mg Oral Daily   multivitamin with minerals  1 tablet Oral Daily   QUEtiapine  25 mg Oral BID   thiamine  100 mg Oral Daily   tuberculin  5 Units Intradermal Once   vitamin B-12  1,000 mcg Oral Daily   Continuous Infusions:   LOS: 100 days    Time spent:15 mins. More than 50% of that time was spent in counseling and/or coordination of care.      Burnadette Pop, MD [facility] P6/18/2022, 10:58 AM

## 2021-04-07 NOTE — Progress Notes (Signed)
PPD placed 1205 04/05/21 on left forearm.  PPD read at 1200 on 04/07/21. 64mm induration noted. PPD result is negative.

## 2021-04-08 DIAGNOSIS — N179 Acute kidney failure, unspecified: Secondary | ICD-10-CM | POA: Diagnosis not present

## 2021-04-08 NOTE — Progress Notes (Signed)
PROGRESS NOTE    Alvin Clark  BDZ:[id number] DOB: 03-May-1961 DOA: 12/27/2020 PCP: Patient, No Pcp Per (Inactive)   Chief Complain: Confusion  Brief Narrative: Patient is a 60 year old male with history of chronic alcohol abuse who was initially admitted on 12/27/2020 after he was found frail, confused and disoriented when [facility] police entered his home to evict him.  On presentation he was confused.  He was started on IV fluids, thiamine.  He had prolonged hospitalization because of unsafe discharge and no place to go.  Pending placement.  Assessment & Plan:   Principal Problem:   AKI (acute kidney injury) (HCC) Active Problems:   Confusion   Leukocytosis   Blurry vision, bilateral   Transaminitis   Constipation   Withdrawal symptoms, alcohol (HCC)   Protein-calorie malnutrition, severe   Acute metabolic encephalopathy   Hypokalemia   Hypomagnesemia   Hypophosphatemia   Fall   Debility   Low vitamin B12 level   Chronic alcohol use   Metabolic encephalopathy/confusion: On presentation he was confused, still intermittently confused. 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  Continue thiamine, multivitamin, folic acid, vitamin B12.  Monitor mental status. CT head did not show any acute intracranial abnormalities.  Severe protein calorie malnutrition/failure to thrive: Secondary to chronic alcohol abuse.  Nutrition team is following here.  Hypertension: Currently blood pressure stable.  Continue Coreg  AKI: Resolved  Alcoholic hepatitis: Resolved    Nutrition Problem: Severe Malnutrition Etiology: chronic illness (chronic alcoholism)      DVT prophylaxis: Lovenox Code Status: Full Family Communication: None at bedside Status is: Inpatient  Remains inpatient appropriate because:Inpatient level of care appropriate due to severity of illness   Dispo: The patient is from: Home              Anticipated  d/c is to: ALF              Patient currently is medically stable to d/c.   Difficult to place patient Yes    Consultants: None  Procedures:Npne  Antimicrobials:  Anti-infectives (From admission, onward)    None       Subjective:  Patient seen at the bedside this morning.  Hemodynamically stable.  Comfortable   Objective: Vitals:   04/07/21 1249 04/07/21 2046 04/08/21 0500 04/08/21 0611  BP: 126/79 (!) 151/94  (!) 135/91  Pulse: 82 80  81  Resp: 17 16  16   Temp: 98.3 F (36.8 C) 98.1 F (36.7 C)  98.7 F (37.1 C)  TempSrc:  Oral  Oral  SpO2:  97%  97%  Weight:   84.6 kg   Height:        Intake/Output Summary (Last 24 hours) at 04/08/2021 1031 Last data filed at 04/08/2021 0900 Gross per 24 hour  Intake 757 ml  Output 2 ml  Net 755 ml    Filed Weights   04/06/21 0500 04/07/21 0445 04/08/21 0500  Weight: 80.6 kg 85.1 kg 84.6 kg    Examination:  General exam: Comfortable, lying on bed  Data Reviewed: I have personally reviewed following labs and imaging studies  CBC: No results for input(s): WBC, NEUTROABS, HGB, HCT, MCV, PLT in the last 168 hours.  Basic Metabolic Panel: No results for input(s): NA, K, CL, CO2, GLUCOSE, BUN, CREATININE, CALCIUM, MG, PHOS in the last 168 hours.  GFR: Estimated Creatinine Clearance: 77.2 mL/min (by C-G formula based on SCr of 1.03 mg/dL). Liver Function Tests: No  results for input(s): AST, ALT, ALKPHOS, BILITOT, PROT, ALBUMIN in the last 168 hours. No results for input(s): LIPASE, AMYLASE in the last 168 hours. No results for input(s): AMMONIA in the last 168 hours. Coagulation Profile: No results for input(s): INR, PROTIME in the last 168 hours. Cardiac Enzymes: No results for input(s): CKTOTAL, CKMB, CKMBINDEX, TROPONINI in the last 168 hours. BNP (last 3 results) No results for input(s): PROBNP in the last 8760 hours. HbA1C: No results for input(s): HGBA1C in the last 72 hours. CBG: No results for input(s):  GLUCAP in the last 168 hours. Lipid Profile: No results for input(s): CHOL, HDL, LDLCALC, TRIG, CHOLHDL, LDLDIRECT in the last 72 hours. Thyroid Function Tests: No results for input(s): TSH, T4TOTAL, FREET4, T3FREE, THYROIDAB in the last 72 hours. Anemia Panel: No results for input(s): VITAMINB12, FOLATE, FERRITIN, TIBC, IRON, RETICCTPCT in the last 72 hours. Sepsis Labs: No results for input(s): PROCALCITON, LATICACIDVEN in the last 168 hours.  No results found for this or any previous visit (from the past 240 hour(s)).       Radiology Studies: No results found.      Scheduled Meds:  carvedilol  6.25 mg Oral BID WC   enoxaparin (LOVENOX) injection  40 mg Subcutaneous Q24H   feeding supplement  237 mL Oral TID BM   folic acid  1 mg Oral Daily   multivitamin with minerals  1 tablet Oral Daily   QUEtiapine  25 mg Oral BID   thiamine  100 mg Oral Daily   vitamin B-12  1,000 mcg Oral Daily   Continuous Infusions:   LOS: 101 days    Time spent:15 mins. More than 50% of that time was spent in counseling and/or coordination of care.      Burnadette Pop, MD [facility] P6/19/2022, 10:31 AM

## 2021-04-09 DIAGNOSIS — N179 Acute kidney failure, unspecified: Secondary | ICD-10-CM | POA: Diagnosis not present

## 2021-04-09 NOTE — Progress Notes (Signed)
PROGRESS NOTE    Alvin Clark  JIR:[id number] DOB: 18-May-1961 DOA: 12/27/2020 PCP: Patient, No Pcp Per (Inactive)   Chief Complain: Confusion  Brief Narrative: Patient is a 60 year old male with history of chronic alcohol abuse who was initially admitted on 12/27/2020 after he was found frail, confused and disoriented when [facility] police entered his home to evict him.  On presentation he was confused.  He was started on IV fluids, thiamine.  He had prolonged hospitalization because of unsafe discharge and no place to go.  Pending placement.  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  Continue thiamine, multivitamin, folic acid, vitamin B12.  Monitor mental status. CT head did not show any acute intracranial abnormalities.  Severe protein calorie malnutrition/failure to thrive: Secondary to chronic alcohol abuse.  Nutrition team is following here.  Hypertension: Currently blood pressure stable.  Continue Coreg  AKI: Resolved  Alcoholic hepatitis: Resolved    Nutrition Problem: Severe Malnutrition Etiology: chronic illness (chronic alcoholism)      DVT prophylaxis: Lovenox Code Status: Full Family Communication: None at bedside Status is: Inpatient  Remains inpatient appropriate because:Inpatient level of care appropriate due to severity of illness   Dispo: The patient is from: Home              Anticipated d/c is to: ALF               Patient currently is medically stable to d/c.   Difficult to place patient Yes    Consultants: None  Procedures:Npne  Antimicrobials:  Anti-infectives (From admission, onward)    None       Subjective:  Patient seen at the bedside.  Lying on bed without any complains  Objective: Vitals:   04/08/21 1319 04/08/21 2031 04/09/21 0437 04/09/21 0450  BP: (!) 124/96 140/82 132/84   Pulse: (!) 101 87 84   Resp: 18 20 16    Temp: 98.3 F (36.8 C) 98.9 F (37.2 C) 98 F (36.7 C)   TempSrc:  Oral Oral   SpO2: 97% 98% 96%   Weight:    86.7 kg  Height:        Intake/Output Summary (Last 24 hours) at 04/09/2021 0810 Last data filed at 04/08/2021 1700 Gross per 24 hour  Intake 837 ml  Output 2 ml  Net 835 ml    Filed Weights   04/07/21 0445 04/08/21 0500 04/09/21 0450  Weight: 85.1 kg 84.6 kg 86.7 kg    Examination:  General exam: Comfortable, lying on bed  Data Reviewed: I have personally reviewed following labs and imaging studies  CBC: No results for input(s): WBC, NEUTROABS, HGB, HCT, MCV, PLT in the last 168 hours.  Basic Metabolic Panel: No results for input(s): NA, K, CL, CO2, GLUCOSE, BUN, CREATININE, CALCIUM, MG, PHOS in the last 168 hours.  GFR: Estimated Creatinine Clearance: 84.2 mL/min (by C-G formula based on SCr of 1.03 mg/dL). Liver Function Tests: No results for input(s): AST, ALT,  ALKPHOS, BILITOT, PROT, ALBUMIN in the last 168 hours. No results for input(s): LIPASE, AMYLASE in the last 168 hours. No results for input(s): AMMONIA in the last 168 hours. Coagulation Profile: No results for input(s): INR, PROTIME in the last 168 hours. Cardiac Enzymes: No results for input(s): CKTOTAL, CKMB, CKMBINDEX, TROPONINI in the last 168 hours. BNP (last 3 results) No results for input(s): PROBNP in the last 8760 hours. HbA1C: No results for input(s): HGBA1C in the last 72 hours. CBG: No results for input(s): GLUCAP in the last 168 hours. Lipid  Profile: No results for input(s): CHOL, HDL, LDLCALC, TRIG, CHOLHDL, LDLDIRECT in the last 72 hours. Thyroid Function Tests: No results for input(s): TSH, T4TOTAL, FREET4, T3FREE, THYROIDAB in the last 72 hours. Anemia Panel: No results for input(s): VITAMINB12, FOLATE, FERRITIN, TIBC, IRON, RETICCTPCT in the last 72 hours. Sepsis Labs: No results for input(s): PROCALCITON, LATICACIDVEN in the last 168 hours.  No results found for this or any previous visit (from the past 240 hour(s)).       Radiology Studies: No results found.      Scheduled Meds:  carvedilol  6.25 mg Oral BID WC   enoxaparin (LOVENOX) injection  40 mg Subcutaneous Q24H   feeding supplement  237 mL Oral TID BM   folic acid  1 mg Oral Daily   multivitamin with minerals  1 tablet Oral Daily   QUEtiapine  25 mg Oral BID   thiamine  100 mg Oral Daily   vitamin B-12  1,000 mcg Oral Daily   Continuous Infusions:   LOS: 102 days    Time spent:15 mins. More than 50% of that time was spent in counseling and/or coordination of care.      Burnadette Pop, MD [facility] P6/20/2022, 8:10 AM

## 2021-04-10 DIAGNOSIS — N179 Acute kidney failure, unspecified: Secondary | ICD-10-CM | POA: Diagnosis not present

## 2021-04-10 NOTE — TOC Progression Note (Addendum)
Transition of Care [facility]) - Progression Note   Patient Details  Name: Alvin Clark MRN: [id number] Date of Birth: [birth year]  Transition of [facility] CM/SW Contact  Ewing Schlein, LCSW Phone Number: 04/10/2021, 2:03 PM  Clinical Narrative: CSW spoke with patient's daughter, Shayon Trompeter. Per daughter, the family received a call that patient's disability has been approved. Family is interested in memory care for the patient due to his memory issues. TOC to follow.  Expected Discharge Plan: Assisted Living (versus family care home) Barriers to Discharge: Financial Resources, Inadequate or no insurance, Other (must enter comment) (No bed availability at an ALF or Memory care facility.)  Expected Discharge Plan and Services Expected Discharge Plan: Assisted Living (versus family care home) In-house Referral: Clinical Social Work, Artist Living arrangements for the past 2 months: No permanent address  Readmission Risk Interventions No flowsheet data found.

## 2021-04-10 NOTE — Progress Notes (Signed)
PROGRESS NOTE    Alvin Clark  KVQ:[id number] DOB: June 19, 1961 DOA: 12/27/2020 PCP: Patient, No Pcp Per (Inactive)   Chief Complain: Confusion  Brief Narrative: Patient is a 60 year old male with history of chronic alcohol abuse who was initially admitted on 12/27/2020 after he was found frail, confused and disoriented when [facility] police entered his home to evict him.  On presentation he was confused.  He was started on IV fluids, thiamine.  He had prolonged hospitalization because of unsafe discharge and no place to go.  Pending placement.  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  Continue thiamine, multivitamin, folic acid, vitamin B12.  Monitor mental status. CT head did not show any acute intracranial abnormalities.  Severe protein calorie malnutrition/failure to thrive: Secondary to chronic alcohol abuse.  Nutrition team is following here.  Hypertension: Currently blood pressure stable.  Continue Coreg  AKI: Resolved  Alcoholic hepatitis: Resolved    Nutrition Problem: Severe Malnutrition Etiology: chronic illness (chronic alcoholism)      DVT prophylaxis: Lovenox Code Status: Full Family Communication: None at bedside Status is: Inpatient  Remains inpatient appropriate because:Inpatient level of care appropriate due to severity of illness   Dispo: The patient is from: Home              Anticipated d/c is to: ALF               Patient currently is medically stable to d/c.   Difficult to place patient Yes    Consultants: None  Procedures:Npne  Antimicrobials:  Anti-infectives (From admission, onward)    None       Subjective:  Patient seen at the bedside this morning.  Hemodynamically stable.  He was lying on the bed as usual and watching TV  Objective: Vitals:   04/09/21 0840 04/09/21 1439 04/09/21 2131 04/10/21 0757  BP: 119/73 134/81 (!) 142/98 116/86  Pulse: 81 (!) 107 98 87  Resp:  14 18   Temp:  98.7 F (37.1 C) 98.4 F (36.9 C)   TempSrc:  Oral Oral   SpO2:  96% 96%   Weight:      Height:        Intake/Output Summary (Last 24 hours) at 04/10/2021 1318 Last data filed at 04/10/2021 0836 Gross per 24 hour  Intake 597 ml  Output --  Net 597 ml    Filed Weights   04/07/21 0445 04/08/21 0500 04/09/21 0450  Weight: 85.1 kg 84.6 kg 86.7 kg    Examination:  General exam: Comfortable, no complaints, watching TV lying on the bed  Data Reviewed: I have personally reviewed following labs and imaging studies  CBC: No results for input(s): WBC, NEUTROABS, HGB, HCT, MCV, PLT in the last 168 hours.  Basic Metabolic Panel: No results for input(s): NA, K, CL, CO2, GLUCOSE, BUN, CREATININE, CALCIUM, MG, PHOS in the last 168 hours.  GFR: Estimated Creatinine Clearance: 84.2 mL/min (by C-G formula based on SCr of 1.03  mg/dL). Liver Function Tests: No results for input(s): AST, ALT, ALKPHOS, BILITOT, PROT, ALBUMIN in the last 168 hours. No results for input(s): LIPASE, AMYLASE in the last 168 hours. No results for input(s): AMMONIA in the last 168 hours. Coagulation Profile: No results for input(s): INR, PROTIME in the last 168 hours. Cardiac Enzymes: No results for input(s): CKTOTAL, CKMB, CKMBINDEX, TROPONINI in the last 168 hours. BNP (last 3 results) No results for input(s): PROBNP in the last 8760 hours. HbA1C: No results for input(s): HGBA1C in the last 72 hours. CBG: No  results for input(s): GLUCAP in the last 168 hours. Lipid Profile: No results for input(s): CHOL, HDL, LDLCALC, TRIG, CHOLHDL, LDLDIRECT in the last 72 hours. Thyroid Function Tests: No results for input(s): TSH, T4TOTAL, FREET4, T3FREE, THYROIDAB in the last 72 hours. Anemia Panel: No results for input(s): VITAMINB12, FOLATE, FERRITIN, TIBC, IRON, RETICCTPCT in the last 72 hours. Sepsis Labs: No results for input(s): PROCALCITON, LATICACIDVEN in the last 168 hours.  No results found for this or any previous visit (from the past 240 hour(s)).       Radiology Studies: No results found.      Scheduled Meds:  carvedilol  6.25 mg Oral BID WC   enoxaparin (LOVENOX) injection  40 mg Subcutaneous Q24H   feeding supplement  237 mL Oral TID BM   folic acid  1 mg Oral Daily   multivitamin with minerals  1 tablet Oral Daily   QUEtiapine  25 mg Oral BID   thiamine  100 mg Oral Daily   vitamin B-12  1,000 mcg Oral Daily   Continuous Infusions:   LOS: 103 days    Time spent:15 mins. More than 50% of that time was spent in counseling and/or coordination of care.      Burnadette Pop, MD [facility] P6/21/2022, 1:18 PM

## 2021-04-11 NOTE — Progress Notes (Signed)
PROGRESS NOTE  Alvin Clark [id number] DOB: [birth year] DOA: 12/27/2020 PCP: Patient, No Pcp Per (Inactive)   LOS: 104 days   Brief Narrative / Interim history: Patient is a 60 year old male with history of chronic alcohol abuse who was initially admitted on 12/27/2020 after he was found frail, confused and disoriented when [facility] police entered his home to evict him.  On presentation he was confused.  He was started on IV fluids, thiamine.  He had prolonged hospitalization because of unsafe discharge and no place to go.  Pending placement.  Subjective / 24h Interval events: Doing well this morning, no complaints.  No chest pain, no shortness of breath.  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  Continue thiamine, multivitamin, folic acid, vitamin B12.  Monitor mental status. CT head did not show any acute intracranial abnormalities.  Active Problems Severe protein calorie malnutrition/failure to thrive -Secondary to chronic alcohol abuse.  Nutrition team is following here.   Hypertension-Currently blood pressure stable.  Continue Coreg   AKI -Resolved   Alcoholic hepatitis-Resolved  Scheduled Meds:  carvedilol  6.25 mg Oral BID WC   enoxaparin (LOVENOX) injection  40 mg Subcutaneous Q24H   feeding supplement  237 mL Oral TID BM   folic acid  1 mg Oral Daily   multivitamin with minerals  1 tablet Oral Daily   QUEtiapine  25 mg Oral BID   thiamine  100 mg Oral Daily   vitamin B-12  1,000 mcg Oral Daily   Continuous Infusions: PRN Meds:.acetaminophen  Diet Orders (From admission, onward)     Start     Ordered   12/27/20 2007  Diet heart healthy/carb modified Room service appropriate? Yes; Fluid consistency: Thin  Diet effective now       Question Answer Comment  Diet-HS Snack? Nothing   Room service  appropriate? Yes   Fluid consistency: Thin      12/27/20 2008            DVT prophylaxis: enoxaparin (LOVENOX) injection 40 mg Start: 04/04/21 2200     Code Status: Full Code  Family Communication: No family at bedside  Status is: Inpatient  Remains inpatient appropriate because:Inpatient level of care appropriate due to severity of illness  Dispo: The patient is from: Home              Anticipated d/c is to:  TBD              Patient currently is medically stable to d/c.   Difficult to place patient Yes   Level of care: Med-Surg  Consultants:  none  Procedures:  none  Microbiology  none  Antimicrobials: none    Objective: Vitals:   04/10/21 0757 04/10/21 1333 04/10/21 2048 04/11/21 0550  BP: 116/86 126/84 134/79 120/82  Pulse: 87 92 92 85  Resp:  16 18 18   Temp:  98.6 F (37 C) 98.4 F (36.9 C) 98.4 F (36.9 C)  TempSrc:   Oral Oral  SpO2:  97% 97% 96%  Weight:      Height:        Intake/Output Summary (Last 24 hours) at 04/11/2021 1222 Last data filed at 04/10/2021 1900 Gross per 24 hour  Intake 480 ml  Output --  Net 480 ml   Filed Weights   04/07/21 0445 04/08/21 0500 04/09/21 0450  Weight: 85.1 kg 84.6  kg 86.7 kg    Examination:  Constitutional: NAD Respiratory: clear to auscultation bilaterally, no wheezing, no crackles. Normal respiratory effort. No accessory muscle use.  Cardiovascular: Regular rate and rhythm, no murmurs / rubs / gallops. No LE edema.   Data Reviewed: I have independently reviewed following labs and imaging studies  CBC: No results for input(s): WBC, NEUTROABS, HGB, HCT, MCV, PLT in the last 168 hours. Basic Metabolic Panel: No results for input(s): NA, K, CL, CO2, GLUCOSE, BUN, CREATININE, CALCIUM, MG, PHOS in the last 168 hours. Liver Function Tests: No results for input(s): AST, ALT, ALKPHOS, BILITOT, PROT, ALBUMIN in the last 168 hours. Coagulation Profile: No results for input(s): INR, PROTIME in the  last 168 hours. HbA1C: No results for input(s): HGBA1C in the last 72 hours. CBG: No results for input(s): GLUCAP in the last 168 hours.  No results found for this or any previous visit (from the past 240 hour(s)).   Radiology Studies: No results found.  Pamella Pert, MD, PhD [facility]  Between 7 am - 7 pm I am available, please contact me via Amion (for emergencies) or Securechat (non urgent messages)  Between 7 pm - 7 am I am not available, please contact night coverage MD/APP via Amion

## 2021-04-11 NOTE — Plan of Care (Signed)
  Problem: Clinical Measurements: Goal: Ability to maintain clinical measurements within normal limits will improve Outcome: Progressing Goal: Will remain free from infection Outcome: Progressing Goal: Cardiovascular complication will be avoided Outcome: Progressing   

## 2021-04-12 LAB — COMPREHENSIVE METABOLIC PANEL
ALT: 23 U/L (ref 0–44)
AST: 30 U/L (ref 15–41)
Albumin: 4 g/dL (ref 3.5–5.0)
Alkaline Phosphatase: 108 U/L (ref 38–126)
Anion gap: 7 (ref 5–15)
BUN: 17 mg/dL (ref 6–20)
CO2: 26 mmol/L (ref 22–32)
Calcium: 9.9 mg/dL (ref 8.9–10.3)
Chloride: 104 mmol/L (ref 98–111)
Creatinine, Ser: 1.01 mg/dL (ref 0.61–1.24)
GFR, Estimated: >60 mL/min (ref >60)
Glucose, Bld: 97 mg/dL (ref 70–99)
Potassium: 4.6 mmol/L (ref 3.5–5.1)
Sodium: 137 mmol/L (ref 135–145)
Total Bilirubin: 0.4 mg/dL (ref 0.3–1.2)
Total Protein: 8.3 g/dL — ABNORMAL HIGH (ref 6.5–8.1)

## 2021-04-12 LAB — CBC
HCT: 46.9 % (ref 39.0–52.0)
Hemoglobin: 15.6 g/dL (ref 13.0–17.0)
MCH: 30.9 pg (ref 26.0–34.0)
MCHC: 33.3 g/dL (ref 30.0–36.0)
MCV: 92.9 fL (ref 80.0–100.0)
Platelets: 196 10*3/uL (ref 150–400)
RBC: 5.05 MIL/uL (ref 4.22–5.81)
RDW: 11.9 % (ref 11.5–15.5)
WBC: 9.1 10*3/uL (ref 4.0–10.5)
nRBC: 0 % (ref 0.0–0.2)

## 2021-04-12 NOTE — TOC Progression Note (Signed)
Transition of Care [facility]) - Progression Note    Patient Details  Name: Alvin Clark MRN: [id number] Date of Birth: 05-06-1961  [facility]) CM/SW Contact  Ida Rogue, [place] Phone Number: 04/12/2021, 9:10 AM  Clinical Narrative:   Received call from Ms Wallis Bamberg following up on my initial referral.  She wanted to come visit with patient, but will first talk with his sister as a pre-screening step as she is concerned about his on-going substance use.  I informed her that he has been approved for disability and MCD.  TOC will continue to follow during the course of hospitalization.     Expected Discharge Plan: Assisted Living (versus family care home) Barriers to Discharge: Financial Resources, Inadequate or no insurance, Other (must enter comment) (No bed availability at an ALF or Memory care facility.)  Expected Discharge Plan and Services Expected Discharge Plan: Assisted Living (versus family care home) In-house Referral: Clinical Social Work, Artist     Living arrangements for the past 2 months: No permanent address                                       Social Determinants of Health (SDOH) Interventions    Readmission Risk Interventions No flowsheet data found.

## 2021-04-12 NOTE — TOC Progression Note (Addendum)
Transition of Care [facility]) - Progression Note    Patient Details  Name: Alvin Clark MRN: [id number] Date of Birth: 1961/01/28  Transition of Care [facility]) CM/SW Contact  Alvin Clark, [place] Phone Number: 04/12/2021, 2:13 PM  Clinical Narrative:   Placement search continues: Left message for [facility], [facility].  [facility] and [address] are a no, as are [facility] and [facility]. FL2 faxed to Mr [name] at [phone].  Will also contact [facility], [place] 336 [phone] and [phone]. TOC will continue to follow during the course of hospitalization.  Addendum: [facility]-unable to leave message.  [place]-left message.  Spoke with Ms Alvin Clark at [employer].  She asked that I fax referral.  Referral faxed to [phone].    Expected Discharge Plan: Assisted Living (versus family care home) Barriers to Discharge: Other (must enter comment) (ALF/[facility]CH pending bed offer)  Expected Discharge Plan and Services Expected Discharge Plan: Assisted Living (versus family care home) In-house Referral: Clinical Social Work, Artist     Living arrangements for the past 2 months: No permanent address                                       Social Determinants of Health (SDOH) Interventions    Readmission Risk Interventions No flowsheet data found.

## 2021-04-12 NOTE — Progress Notes (Signed)
PROGRESS NOTE  Alvin Clark [id number]:[id number] DOB: 1961/08/05 DOA: 12/27/2020 PCP: Patient, No Pcp Per (Inactive)   LOS: 105 days   Brief Narrative / Interim history: Patient is a 60 year old male with history of chronic alcohol abuse who was initially admitted on 12/27/2020 after he was found frail, confused and disoriented when [facility] police entered his home to evict him.  On presentation he was confused.  He was started on IV fluids, thiamine.  He had prolonged hospitalization because of unsafe discharge and no place to go.  Pending placement.  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  Continue thiamine, multivitamin, folic acid, vitamin B12.  Monitor mental status. CT head did not show any acute intracranial abnormalities.  Active Problems Severe protein calorie malnutrition/failure to thrive -Secondary to chronic alcohol abuse.  Nutrition team is following here.   Hypertension-Currently blood pressure stable.  Continue Coreg   AKI -Resolved   Alcoholic hepatitis-Resolved  Scheduled Meds:  carvedilol  6.25 mg Oral BID WC   enoxaparin (LOVENOX) injection  40 mg Subcutaneous Q24H   feeding supplement  237 mL Oral TID BM   folic acid  1 mg Oral Daily   multivitamin with minerals  1 tablet Oral Daily   QUEtiapine  25 mg Oral BID   thiamine  100 mg Oral Daily   vitamin B-12  1,000 mcg Oral Daily   Continuous Infusions: PRN Meds:.acetaminophen  Diet Orders (From admission, onward)     Start     Ordered   12/27/20 2007  Diet heart healthy/carb modified Room service appropriate? Yes; Fluid consistency: Thin  Diet effective now       Question Answer Comment  Diet-HS Snack? Nothing   Room service appropriate? Yes   Fluid consistency: Thin      12/27/20 2008             DVT prophylaxis: enoxaparin (LOVENOX) injection 40 mg Start: 04/04/21 2200     Code Status: Full Code  Family Communication: No family at bedside  Status is: Inpatient  Remains inpatient appropriate because:Inpatient level of care appropriate due to severity of illness  Dispo: The patient is from: Home              Anticipated d/c is to:  TBD              Patient currently is medically stable to d/c.   Difficult to place patient Yes   Level of care: Med-Surg  Consultants:  none  Procedures:  none  Microbiology  none  Antimicrobials: none    Objective: Vitals:   04/11/21 2138 04/12/21 0449 04/12/21 0500 04/12/21 0851  BP: 128/88 126/86  129/89  Pulse: 96 89  91  Resp: 16     Temp: 98.5 F (36.9 C) 98.3 F (36.8 C)    TempSrc:  Oral    SpO2: 96% 95%    Weight:   84.5 kg   Height:        Intake/Output Summary (Last 24 hours) at 04/12/2021 1041 Last data filed at 04/11/2021 1859 Gross per 24 hour  Intake 240 ml  Output --  Net 240 ml    Filed Weights   04/08/21 0500 04/09/21 0450 04/12/21 0500  Weight: 84.6 kg 86.7 kg 84.5 kg    Examination:  Constitutional: nad Respiratory: cta,  no wheezing Cardiovascular: rrr, no mrg  Data Reviewed: I have independently reviewed following labs and imaging studies  CBC: Recent Labs  Lab 04/12/21 0615  WBC 9.1  HGB 15.6  HCT 46.9  MCV 92.9  PLT 196   Basic Metabolic Panel: Recent Labs  Lab 04/12/21 0615  NA 137  K 4.6  CL 104  CO2 26  GLUCOSE 97  BUN 17  CREATININE 1.01  CALCIUM 9.9   Liver Function Tests: Recent Labs  Lab 04/12/21 0615  AST 30  ALT 23  ALKPHOS 108  BILITOT 0.4  PROT 8.3*  ALBUMIN 4.0   Coagulation Profile: No results for input(s): INR, PROTIME in the last 168 hours. HbA1C: No results for input(s): HGBA1C in the last 72 hours. CBG: No results for input(s): GLUCAP in the last 168 hours.  No results found for this or any previous visit (from the  past 240 hour(s)).   Radiology Studies: No results found.  Pamella Pert, MD, PhD [facility]  Between 7 am - 7 pm I am available, please contact me via Amion (for emergencies) or Securechat (non urgent messages)  Between 7 pm - 7 am I am not available, please contact night coverage MD/APP via Amion

## 2021-04-13 NOTE — Progress Notes (Signed)
PROGRESS NOTE  MASIN SHATTO CMK:[id number] DOB: [birth year] DOA: 12/27/2020 PCP: Patient, No Pcp Per (Inactive)   LOS: 106 days   Brief Narrative / Interim history: Patient is a 60 year old male with history of chronic alcohol abuse who was initially admitted on 12/27/2020 after he was found frail, confused and disoriented when [facility] police entered his home to evict him.  On presentation he was confused.  He was started on IV fluids, thiamine.  He had prolonged hospitalization because of unsafe discharge and no place to go.  Pending placement.  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  Continue thiamine, multivitamin, folic acid, vitamin B12.  Monitor mental status. CT head did not show any acute intracranial abnormalities.  Active Problems Severe protein calorie malnutrition/failure to thrive -Secondary to chronic alcohol abuse.  Nutrition team is following here.   Hypertension-Currently blood pressure stable.  Continue Coreg   AKI -Resolved   Alcoholic hepatitis-Resolved  Scheduled Meds:  carvedilol  6.25 mg Oral BID WC   enoxaparin (LOVENOX) injection  40 mg Subcutaneous Q24H   feeding supplement  237 mL Oral TID BM   folic acid  1 mg Oral Daily   multivitamin with minerals  1 tablet Oral Daily   QUEtiapine  25 mg Oral BID   thiamine  100 mg Oral Daily   vitamin B-12  1,000 mcg Oral Daily   Continuous Infusions: PRN Meds:.acetaminophen  Diet Orders (From admission, onward)     Start     Ordered   12/27/20 2007  Diet heart healthy/carb modified Room service appropriate? Yes; Fluid consistency: Thin  Diet effective now       Question Answer Comment  Diet-HS Snack? Nothing   Room service appropriate? Yes   Fluid consistency: Thin       12/27/20 2008            DVT prophylaxis: enoxaparin (LOVENOX) injection 40 mg Start: 04/04/21 2200     Code Status: Full Code  Family Communication: No family at bedside  Status is: Inpatient  Remains inpatient appropriate because:Inpatient level of care appropriate due to severity of illness  Dispo: The patient is from: Home              Anticipated d/c is to:  TBD              Patient currently is medically stable to d/c.   Difficult to place patient Yes   Level of care: Med-Surg  Consultants:  none  Procedures:  none  Microbiology  none  Antimicrobials: none    Objective: Vitals:   04/12/21 1359 04/12/21 2036 04/13/21 0500 04/13/21 0622  BP: (!) 140/91 132/80  132/86  Pulse: 94 86  81  Resp: 16 20  20   Temp: 98.2 F (36.8 C) 98.4 F (36.9 C)    TempSrc: Oral Oral    SpO2: 97% 95%  96%  Weight:   86.6 kg   Height:       No intake or output data in the 24 hours ending 04/13/21 0949  Filed Weights   04/09/21 0450 04/12/21 0500 04/13/21 0500  Weight: 86.7 kg 84.5 kg 86.6 kg    Examination:  Constitutional: nad Respiratory: cta Cardiovascular: rrr, no mrg  Data Reviewed: I have independently reviewed following labs and imaging studies  CBC: Recent Labs  Lab 04/12/21 0615  WBC 9.1  HGB 15.6  HCT 46.9  MCV 92.9  PLT 196    Basic Metabolic Panel: Recent Labs  Lab 04/12/21 0615  NA 137  K 4.6  CL 104  CO2 26  GLUCOSE 97  BUN 17  CREATININE 1.01  CALCIUM 9.9    Liver Function Tests: Recent Labs  Lab 04/12/21 0615  AST 30  ALT 23  ALKPHOS 108  BILITOT 0.4  PROT 8.3*  ALBUMIN 4.0    Coagulation Profile: No results for input(s): INR, PROTIME in the last 168 hours. HbA1C: No results for input(s): HGBA1C in the last 72 hours. CBG: No results for input(s): GLUCAP in the last 168 hours.  No results found for this or any previous visit (from the past 240 hour(s)).   Radiology Studies: No results found.  Pamella Pert, MD, PhD [facility]  Between 7 am - 7 pm I am available, please contact me via Amion (for emergencies) or Securechat (non urgent messages)  Between 7 pm - 7 am I am not available, please contact night coverage MD/APP via Amion

## 2021-04-13 NOTE — Progress Notes (Signed)
   04/13/21 1500  Mobility  Activity Ambulated in hall  Level of Assistance Independent  Distance Ambulated (ft) 400 ft  Mobility Ambulated independently in hallway  Mobility Response Tolerated well  Mobility performed by Mobility specialist  $Mobility charge 1 Mobility   Pt ambulated 400 ft in hallway independently. No assistance or guard needed. Tolerated session very well. Left in bed per pt request, call bell left at side.   Arliss Journey Mobility Specialist Acute Rehabilitation Services Office: [phone] 04/13/21, 3:57 PM

## 2021-04-14 NOTE — Progress Notes (Signed)
PROGRESS NOTE  Alvin Clark [id number]:[id number] DOB: 1961/09/23 DOA: 12/27/2020 PCP: Patient, No Pcp Per (Inactive)   LOS: 107 days   Brief Narrative / Interim history: Patient is a 60 year old male with history of chronic alcohol abuse who was initially admitted on 12/27/2020 after he was found frail, confused and disoriented when [facility] police entered his home to evict him.  On presentation he was confused.  He was started on IV fluids, thiamine.  He had prolonged hospitalization because of unsafe discharge and no place to go.  Pending placement.  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  Continue thiamine, multivitamin, folic acid, vitamin B12.  Monitor mental status. CT head did not show any acute intracranial abnormalities.  Active Problems Severe protein calorie malnutrition/failure to thrive -Secondary to chronic alcohol abuse.  Nutrition team is following here.   Hypertension-Currently blood pressure stable.  Continue Coreg   AKI -Resolved   Alcoholic hepatitis-Resolved  Scheduled Meds:  carvedilol  6.25 mg Oral BID WC   enoxaparin (LOVENOX) injection  40 mg Subcutaneous Q24H   feeding supplement  237 mL Oral TID BM   folic acid  1 mg Oral Daily   multivitamin with minerals  1 tablet Oral Daily   QUEtiapine  25 mg Oral BID   thiamine  100 mg Oral Daily   vitamin B-12  1,000 mcg Oral Daily   Continuous Infusions: PRN Meds:.acetaminophen  Diet Orders (From admission, onward)     Start     Ordered   12/27/20 2007  Diet heart healthy/carb modified Room service appropriate? Yes; Fluid consistency: Thin  Diet effective now       Question Answer Comment  Diet-HS Snack? Nothing   Room service appropriate? Yes   Fluid consistency: Thin      12/27/20 2008             DVT prophylaxis: enoxaparin (LOVENOX) injection 40 mg Start: 04/04/21 2200     Code Status: Full Code  Family Communication: No family at bedside  Status is: Inpatient  Remains inpatient appropriate because:Inpatient level of care appropriate due to severity of illness  Dispo: The patient is from: Home              Anticipated d/c is to:  TBD              Patient currently is medically stable to d/c.   Difficult to place patient Yes   Level of care: Med-Surg  Consultants:  none  Procedures:  none  Microbiology  none  Antimicrobials: none    Objective: Vitals:   04/13/21 1449 04/13/21 2100 04/14/21 0446 04/14/21 0500  BP: (!) 135/95 128/90 117/85   Pulse: 87  84   Resp: 18 18 16    Temp: (!) 97.5 F (36.4 C) 98 F (36.7 C) 97.9 F (36.6 C)   TempSrc: Oral Oral Oral   SpO2: 94% 95% 96%   Weight:    86.7 kg  Height:        Intake/Output Summary (Last 24 hours) at 04/14/2021 1000 Last data filed at 04/14/2021 0900 Gross per 24 hour  Intake 720 ml  Output --  Net 720 ml    Filed Weights   04/12/21 0500 04/13/21 0500 04/14/21 0500  Weight: 84.5 kg 86.6 kg 86.7 kg    Examination:  Constitutional: No distress  Data Reviewed: I have independently reviewed following labs and imaging studies  CBC: Recent Labs  Lab 04/12/21 0615  WBC 9.1  HGB 15.6  HCT 46.9  MCV 92.9  PLT 196    Basic Metabolic Panel: Recent Labs  Lab 04/12/21 0615  NA 137  K 4.6  CL 104  CO2 26  GLUCOSE 97  BUN 17  CREATININE 1.01  CALCIUM 9.9    Liver Function Tests: Recent Labs  Lab 04/12/21 0615  AST 30  ALT 23  ALKPHOS 108  BILITOT 0.4  PROT 8.3*  ALBUMIN 4.0    Coagulation Profile: No results for input(s): INR, PROTIME in the last 168 hours. HbA1C: No results for input(s): HGBA1C in the last 72 hours. CBG: No results for input(s): GLUCAP in the last 168 hours.  No results found for this or any previous visit (from the past 240  hour(s)).   Radiology Studies: No results found.  Pamella Pert, MD, PhD [facility]  Between 7 am - 7 pm I am available, please contact me via Amion (for emergencies) or Securechat (non urgent messages)  Between 7 pm - 7 am I am not available, please contact night coverage MD/APP via Amion

## 2021-04-15 NOTE — Progress Notes (Signed)
PROGRESS NOTE  Alvin Clark [id number]:[id number] DOB: [birth year] DOA: 12/27/2020 PCP: Patient, No Pcp Per (Inactive)   LOS: 108 days   Brief Narrative / Interim history: Patient is a 60 year old male with history of chronic alcohol abuse who was initially admitted on 12/27/2020 after he was found frail, confused and disoriented when [facility] police entered his home to evict him.  On presentation he was confused.  He was started on IV fluids, thiamine.  He had prolonged hospitalization because of unsafe discharge and no place to go.  Pending placement.  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  Continue thiamine, multivitamin, folic acid, vitamin B12.  Monitor mental status. CT head did not show any acute intracranial abnormalities.  Active Problems Severe protein calorie malnutrition/failure to thrive -Secondary to chronic alcohol abuse.  Nutrition team is following here.   Hypertension-Currently blood pressure stable.  Continue Coreg   AKI -Resolved   Alcoholic hepatitis-Resolved  Scheduled Meds:  carvedilol  6.25 mg Oral BID WC   enoxaparin (LOVENOX) injection  40 mg Subcutaneous Q24H   feeding supplement  237 mL Oral TID BM   folic acid  1 mg Oral Daily   multivitamin with minerals  1 tablet Oral Daily   QUEtiapine  25 mg Oral BID   thiamine  100 mg Oral Daily   vitamin B-12  1,000 mcg Oral Daily   Continuous Infusions: PRN Meds:.acetaminophen  Diet Orders (From admission, onward)     Start     Ordered   12/27/20 2007  Diet heart healthy/carb modified Room service appropriate? Yes; Fluid consistency: Thin  Diet effective now       Question Answer Comment  Diet-HS Snack? Nothing   Room service appropriate? Yes   Fluid consistency: Thin      12/27/20 2008             DVT prophylaxis: enoxaparin (LOVENOX) injection 40 mg Start: 04/04/21 2200     Code Status: Full Code  Family Communication: No family at bedside  Status is: Inpatient  Remains inpatient appropriate because:Inpatient level of care appropriate due to severity of illness  Dispo: The patient is from: Home              Anticipated d/c is to:  TBD              Patient currently is medically stable to d/c.   Difficult to place patient Yes   Level of care: Med-Surg  Consultants:  none  Procedures:  none  Microbiology  none  Antimicrobials: none    Objective: Vitals:   04/14/21 0500 04/14/21 1407 04/14/21 2031 04/15/21 0459  BP:  123/80 132/83 120/82  Pulse:  91 86 90  Resp:  15 20 16   Temp:  (!) 97.1 F (36.2 C) (!) 97.5 F (36.4 C) 97.7 F (36.5 C)  TempSrc:   Oral Oral  SpO2:  97% 95% 94%  Weight: 86.7 kg     Height:        Intake/Output Summary (Last 24 hours) at 04/15/2021 1001 Last data filed at 04/15/2021 04/17/2021 Gross per 24 hour  Intake 1896 ml  Output --  Net 1896 ml    Filed Weights   04/12/21 0500 04/13/21 0500 04/14/21 0500  Weight: 84.5 kg 86.6 kg 86.7 kg    Examination:  Constitutional:  NAD  Data Reviewed: I have independently reviewed following labs and imaging studies  CBC: Recent Labs  Lab 04/12/21 0615  WBC 9.1  HGB 15.6  HCT 46.9  MCV 92.9  PLT 196    Basic Metabolic Panel: Recent Labs  Lab 04/12/21 0615  NA 137  K 4.6  CL 104  CO2 26  GLUCOSE 97  BUN 17  CREATININE 1.01  CALCIUM 9.9    Liver Function Tests: Recent Labs  Lab 04/12/21 0615  AST 30  ALT 23  ALKPHOS 108  BILITOT 0.4  PROT 8.3*  ALBUMIN 4.0    Coagulation Profile: No results for input(s): INR, PROTIME in the last 168 hours. HbA1C: No results for input(s): HGBA1C in the last 72 hours. CBG: No results for input(s): GLUCAP in the last 168 hours.  No results found for this or any previous visit (from the past 240 hour(s)).    Radiology Studies: No results found.  Pamella Pert, MD, PhD [facility]  Between 7 am - 7 pm I am available, please contact me via Amion (for emergencies) or Securechat (non urgent messages)  Between 7 pm - 7 am I am not available, please contact night coverage MD/APP via Amion

## 2021-04-16 NOTE — Progress Notes (Signed)
Chaplain engaged in an initial visit with [employer].  During visit, Erica shared that being in the hospital has been "safe" for him.  He expressed that because he is here, he can eat the things that he should be eating.  He alluded to some unhealthy eating habits outside of the hospital.  Rangel also shared that he had not realized that he has been in the hospital for as long as he has.  He thought it had been about two weeks, but he recognizes now that it has been much longer than that.    To pass the time, Donnell shared that he watches tv, talks with his daughters and coworkers, and takes walks in the hall.  Peter revealed that he used to work in [employer].  He is looking forward to getting back to work.  He voiced valuing being outside.  Johnjoseph did say that he was waiting for the weather to change because he thought that it had been cold outside.  Chaplain told him about the high temperatures happening recently, which seemed to surprise him.  He brought up the weather "turning," highlighting that he has not recognized that it is summertime.    Koah shared that he came to the hospital because of COVID and that he is still in the hospital to prevent any further infections or getting COVID again.    Chaplain offered listening, presence and support.  Ceasar shared that reading may be another outlet for him to pass the time.  Chaplain will find some reading material for him.  Chaplain will follow-up.    04/16/21 1100  Clinical Encounter Type  Visited With Patient  Visit Type Initial

## 2021-04-16 NOTE — Progress Notes (Signed)
Alvin Clark VQM:[id number] DOB: Mar 19, 1961 DOA: 12/27/2020 PCP: Patient, No Pcp Per (Inactive)   LOS: 109 days   Brief Narrative / Interim history: Patient is a 60 year old male with history of chronic alcohol abuse who was initially admitted on 12/27/2020 after he was found frail, confused and disoriented when [facility] police entered his home to evict him.  On presentation he was confused.  He was started on IV fluids, thiamine.  He had prolonged hospitalization because of unsafe discharge and no place to go.  Pending placement.  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  Continue thiamine, multivitamin, folic acid, vitamin B12.  Monitor mental status. CT head did not show any acute intracranial abnormalities.  Active Problems Severe protein calorie malnutrition/failure to thrive -Secondary to chronic alcohol abuse.  Nutrition team is following here.   Hypertension-Currently blood pressure stable.  Continue Coreg   AKI -Resolved   Alcoholic hepatitis-Resolved  Scheduled Meds:  carvedilol  6.25 mg Oral BID WC   enoxaparin (LOVENOX) injection  40 mg Subcutaneous Q24H   feeding supplement  237 mL Oral TID BM   folic acid  1 mg Oral Daily   multivitamin with minerals  1 tablet Oral Daily   QUEtiapine  25 mg Oral BID   thiamine  100 mg Oral Daily   vitamin B-12  1,000 mcg Oral Daily   Continuous Infusions: PRN Meds:.acetaminophen  Diet Orders (From admission, onward)     Start     Ordered   12/27/20 2007  Diet heart healthy/carb modified Room service appropriate? Yes; Fluid consistency: Thin  Diet effective now       Question Answer Comment  Diet-HS Snack? Nothing   Room service appropriate? Yes   Fluid consistency: Thin      12/27/20  2008            DVT prophylaxis: enoxaparin (LOVENOX) injection 40 mg Start: 04/04/21 2200     Code Status: Full Code  Family Communication: No family at bedside  Status is: Inpatient  Remains inpatient appropriate because:Inpatient level of care appropriate due to severity of illness  Dispo: The patient is from: Home              Anticipated d/c is to:  TBD              Patient currently is medically stable to d/c.   Difficult to place patient Yes   Level of care: Med-Surg  Consultants:  none  Procedures:  none  Microbiology  none  Antimicrobials: none    Objective: Vitals:   04/15/21 1401 04/15/21 2024 04/16/21 0445 04/16/21 0823  BP: (!) 139/92 133/85 (!) 127/93 (!) 145/92  Pulse: 86 76 85 79  Resp: 18 20 16    Temp: (!) 97.3 F (36.3 C) 98.7 F (37.1 C) 98.5 F (36.9 C)   TempSrc:  Oral Oral   SpO2: 98% 98% 97%   Weight:      Height:        Intake/Output Summary (Last 24 hours) at 04/16/2021 1037 Last data filed at 04/15/2021 2130 Gross per 24 hour  Intake 712 ml  Output --  Net 712 ml    Filed Weights   04/12/21 0500 04/13/21 0500 04/14/21 0500  Weight: 84.5 kg 86.6 kg 86.7 kg  Examination:  Constitutional: NAD Respiratory: clear to auscultation bilaterally, no wheezing, no crackles. Normal respiratory effort.  Cardiovascular: Regular rate and rhythm, no murmurs / rubs / gallops. No LE edema.  Data Reviewed: I have independently reviewed following labs and imaging studies  CBC: Recent Labs  Lab 04/12/21 0615  WBC 9.1  HGB 15.6  HCT 46.9  MCV 92.9  PLT 196    Basic Metabolic Panel: Recent Labs  Lab 04/12/21 0615  NA 137  K 4.6  CL 104  CO2 26  GLUCOSE 97  BUN 17  CREATININE 1.01  CALCIUM 9.9    Liver Function Tests: Recent Labs  Lab 04/12/21 0615  AST 30  ALT 23  ALKPHOS 108  BILITOT 0.4  PROT 8.3*  ALBUMIN 4.0    Coagulation Profile: No results for input(s): INR, PROTIME in the last 168  hours. HbA1C: No results for input(s): HGBA1C in the last 72 hours. CBG: No results for input(s): GLUCAP in the last 168 hours.  No results found for this or any previous visit (from the past 240 hour(s)).   Radiology Studies: No results found.  Pamella Pert, MD, PhD [facility]  Between 7 am - 7 pm I am available, please contact me via Amion (for emergencies) or Securechat (non urgent messages)  Between 7 pm - 7 am I am not available, please contact night coverage MD/APP via Amion

## 2021-04-16 NOTE — Progress Notes (Signed)
Went on a walk with Alvin Clark to get him up and moving today. On our walk, we went to the main entrance where he was able to watch the rainstorm for a few minutes. Then, we went to [employer] for a mid afternoon snack and returned to the unit. Alvin Clark was pleasant during our walk and liked watching the rain fall.

## 2021-04-17 LAB — RESP PANEL BY RT-PCR (FLU A&B, COVID) ARPGX2
Influenza A by PCR: NEGATIVE
Influenza B by PCR: NEGATIVE
SARS Coronavirus 2 by RT PCR: NEGATIVE

## 2021-04-17 NOTE — TOC Progression Note (Signed)
Transition of Care [facility]) - Progression Note    Patient Details  Name: Alvin Clark MRN: [id number] Date of Birth: 07/27/1961  Transition of Care [facility]) CM/SW Contact  Ida Rogue, [place] Phone Number: 04/17/2021, 3:49 PM  Clinical Narrative:   Mr ripple was assessed in person by Ms Wallis Bamberg of [facility] today.  She had already spoken to his sister as part of the assessment process.  After being given proof of PPD, she made formal bed offer.  She needs COVID test, which was ordered.  Will plan on sending him via [facility] transport tomorrow to [id number] in [place].  Called to sister to finalize plans and ask about COVID vaccination status.  Left message.  TOC will continue to follow during the course of hospitalization.     Expected Discharge Plan: Group Home Barriers to Discharge: Barriers Resolved  Expected Discharge Plan and Services Expected Discharge Plan: Group Home In-house Referral: Clinical Social Work, Artist     Living arrangements for the past 2 months: No permanent address                                       Social Determinants of Health (SDOH) Interventions    Readmission Risk Interventions No flowsheet data found.

## 2021-04-17 NOTE — Progress Notes (Signed)
PROGRESS NOTE  Alvin Clark IRS:[id number] DOB: 10-Feb-1961 DOA: 12/27/2020 PCP: Patient, No Pcp Per (Inactive)   LOS: 110 days   Brief Narrative / Interim history: Patient is a 60 year old male with history of chronic alcohol abuse who was initially admitted on 12/27/2020 after he was found frail, confused and disoriented when [facility] police entered his home to evict him.  On presentation he was confused.  He was started on IV fluids, thiamine.  He had prolonged hospitalization because of unsafe discharge and no place to go.  Pending placement.  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  Continue thiamine, multivitamin, folic acid, vitamin B12.  Monitor mental status. CT head did not show any acute intracranial abnormalities.  Active Problems Severe protein calorie malnutrition/failure to thrive -Secondary to chronic alcohol abuse.  Nutrition team is following here.   Hypertension-Currently blood pressure stable.  Continue Coreg   AKI -Resolved   Alcoholic hepatitis-Resolved  Scheduled Meds:  carvedilol  6.25 mg Oral BID WC   enoxaparin (LOVENOX) injection  40 mg Subcutaneous Q24H   feeding supplement  237 mL Oral TID BM   folic acid  1 mg Oral Daily   multivitamin with minerals  1 tablet Oral Daily   QUEtiapine  25 mg Oral BID   thiamine  100 mg Oral Daily   vitamin B-12  1,000 mcg Oral Daily   Continuous Infusions: PRN Meds:.acetaminophen  Diet Orders (From admission, onward)     Start     Ordered   12/27/20 2007  Diet heart healthy/carb modified Room service appropriate? Yes; Fluid consistency: Thin  Diet effective now       Question Answer Comment  Diet-HS Snack? Nothing   Room service appropriate? Yes   Fluid consistency: Thin      12/27/20  2008            DVT prophylaxis: enoxaparin (LOVENOX) injection 40 mg Start: 04/04/21 2200     Code Status: Full Code  Family Communication: No family at bedside  Status is: Inpatient  Remains inpatient appropriate because:Inpatient level of care appropriate due to severity of illness  Dispo: The patient is from: Home              Anticipated d/c is to:  TBD              Patient currently is medically stable to d/c.   Difficult to place patient Yes   Level of care: Med-Surg  Consultants:  none  Procedures:  none  Microbiology  none  Antimicrobials: none    Objective: Vitals:   04/16/21 1744 04/16/21 1953 04/17/21 0437 04/17/21 0758  BP: 129/82 (!) 147/86 131/82 128/89  Pulse: 87 86 93 (!) 108  Resp:  20 20   Temp:  98.4 F (36.9 C) 98.2 F (36.8 C)   TempSrc:  Oral Oral   SpO2:  98% 98%   Weight:      Height:       No intake or output data in the 24 hours ending 04/17/21 1023  Filed Weights   04/12/21 0500 04/13/21 0500 04/14/21 0500  Weight: 84.5 kg 86.6 kg 86.7 kg    Examination:  Constitutional: NAD Respiratory: CTA, no wheezing Cardiovascular: Regular rate and rhythm, no murmurs, no edema  Data Reviewed: I have independently reviewed following  labs and imaging studies  CBC: Recent Labs  Lab 04/12/21 0615  WBC 9.1  HGB 15.6  HCT 46.9  MCV 92.9  PLT 196    Basic Metabolic Panel: Recent Labs  Lab 04/12/21 0615  NA 137  K 4.6  CL 104  CO2 26  GLUCOSE 97  BUN 17  CREATININE 1.01  CALCIUM 9.9    Liver Function Tests: Recent Labs  Lab 04/12/21 0615  AST 30  ALT 23  ALKPHOS 108  BILITOT 0.4  PROT 8.3*  ALBUMIN 4.0    Coagulation Profile: No results for input(s): INR, PROTIME in the last 168 hours. HbA1C: No results for input(s): HGBA1C in the last 72 hours. CBG: No results for input(s): GLUCAP in the last 168 hours.  No results found for this or any previous visit (from the past 240 hour(s)).   Radiology  Studies: No results found.  Pamella Pert, MD, PhD [facility]  Between 7 am - 7 pm I am available, please contact me via Amion (for emergencies) or Securechat (non urgent messages)  Between 7 pm - 7 am I am not available, please contact night coverage MD/APP via Amion

## 2021-04-18 MED ORDER — ADULT MULTIVITAMIN W/MINERALS CH: 1.0000 | ORAL_TABLET | Freq: Every day | ORAL | Status: AC

## 2021-04-18 MED ORDER — CARVEDILOL 6.25 MG PO TABS: 6.2500 mg | ORAL_TABLET | Freq: Two times a day (BID) | ORAL | 0 refills | Status: AC

## 2021-04-18 MED ORDER — CYANOCOBALAMIN 1000 MCG PO TABS: 1000.0000 ug | ORAL_TABLET | Freq: Every day | ORAL | 0 refills | Status: AC

## 2021-04-18 MED ORDER — FOLIC ACID 1 MG PO TABS: 1.0000 mg | ORAL_TABLET | Freq: Every day | ORAL | 0 refills | Status: AC

## 2021-04-18 MED ORDER — QUETIAPINE FUMARATE 25 MG PO TABS: 25.0000 mg | ORAL_TABLET | Freq: Two times a day (BID) | ORAL | 0 refills | Status: AC

## 2021-04-18 MED ORDER — THIAMINE HCL 100 MG PO TABS: 100.0000 mg | ORAL_TABLET | Freq: Every day | ORAL | 0 refills | Status: AC

## 2021-04-18 NOTE — TOC Transition Note (Signed)
Transition of Care [facility]) - CM/SW Discharge Note   Patient Details  Name: FRANKY REIER MRN: [id number] Date of Birth: 1961/09/04  Transition of Care [facility]) CM/SW Contact:  Ida Rogue, LCSW Phone Number: 04/18/2021, 11:10 AM   Clinical Narrative:   Patient who is stable for d/c today will transfer to [facility] in [place], where Ms Wallis Bamberg is contact at [phone].  All requested paperwork emailed to her.  Left message again today for sister letting her know he is discharging, where he is going, and how to reach Ms Wallis Bamberg.  Contacted [facility] transport for ride to [place]. TOC sign off.    Final next level of care: Group Home Barriers to Discharge: Barriers Resolved   Patient Goals and CMS Choice Patient states their goals for this hospitalization and ongoing recovery are:: SNF for rehab and LTC CMS Medicare.gov Compare Post Acute Care list provided to:: Patient Represenative (must comment) Choice offered to / list presented to : Sibling, [facility] POA / Guardian  Discharge Placement                       Discharge Plan and Services In-house Referral: Clinical Social Work, Artist                                   Social Determinants of Health (SDOH) Interventions     Readmission Risk Interventions No flowsheet data found.

## 2021-04-18 NOTE — Discharge Summary (Addendum)
Physician Discharge Summary  Alvin Clark JJH:[id number] DOB: Oct 18, 1961 DOA: 12/27/2020  PCP: Patient, No Pcp Per (Inactive)  Admit date: 12/27/2020 Discharge date: 04/18/2021  Admitted From: Home Disposition: Group home  Recommendations for Outpatient Follow-up:  Follow up with SNF provider at earliest convenience Follow up in ED if symptoms worsen or new appear   Home Health: No Equipment/Devices: None  Discharge Condition: Stable CODE STATUS: Full Diet recommendation: Heart healthy  Brief/Interim Summary: 60 year old male with history of alcohol abuse presented with confusion after [facility] PD had to enter his home after eviction and he was found confused, frail and disoriented.  On presentation, he was tachycardic, slightly hypertensive with elevated LFTs.  CT of the head was negative for acute abnormality.  Chest x-ray was clear.  He was treated with IV fluids and thiamine.  He has had a long hospitalization because of unsafe discharge planning.  He will be discharged to group home once bed is available.  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  Patient presented with some confusion, but this appears to be at his current baseline due to progression of alcohol-related dementia-currently stable. -Continue thiamine, multivitamin and folic acid along with vitamin B12 -Discharge to group home once bed is available   Severe protein calorie malnutrition due to alcoholism Failure to thrive -Follow nutrition recommendations   Hypertension -Blood pressure stable.  Continue Coreg   Acute kidney injury -Resolved.   Alcoholic hepatitis -Resolved  Discharge Instructions  Discharge Instructions     Diet - low sodium heart healthy   Complete by: As directed    Increase activity slowly   Complete by: As directed        No Known  Allergies Allergies as of 04/18/2021   No Known Allergies      Medication List     STOP taking these medications    HYDROcodone-acetaminophen 5-325 MG tablet Commonly known as: NORCO/VICODIN   ondansetron 8 MG disintegrating tablet Commonly known as: Zofran ODT   pantoprazole 40 MG tablet Commonly known as: PROTONIX       TAKE these medications    carvedilol 6.25 MG tablet Commonly known as: COREG Take 1 tablet (6.25 mg total) by mouth 2 (two) times daily with a meal.   cyanocobalamin 1000 MCG tablet Take 1 tablet (1,000 mcg total) by mouth daily. Start taking on: April 19, 2021   folic acid 1 MG tablet Commonly known as: FOLVITE Take 1 tablet (1 mg total) by mouth daily. Start taking on: April 19, 2021   multivitamin with minerals Tabs tablet Take 1 tablet by mouth daily. Start taking on: April 19, 2021   QUEtiapine 25 MG tablet Commonly known as: SEROQUEL Take 1 tablet (25 mg total) by mouth 2 (two) times daily.   thiamine 100 MG tablet Take 1 tablet (100 mg total) by mouth daily. Start taking on: April 19, 2021          Consultations: None   Procedures/Studies: No results found.    Subjective: Patient seen and examined at bedside.  Poor historian.  No overnight fever, vomiting or worsening shortness of breath reported.    Discharge Exam: Vitals:   04/17/21 2011 04/18/21 0515  BP: 138/83 136/89  Pulse: 93 95  Resp: 20 20  Temp: 98.3 F (36.8 C) 98 F (36.7 C)  SpO2: 98% 97%    General exam: On room air currently.  No acute distress.  Chronically ill looking and looks older than stated age. Respiratory system: Bilateral decreased breath sounds at bases cardiovascular system: S1-S2 heard, rate controlled  gastrointestinal system: Abdomen is mildly distended, soft and nontender.  Bowel sounds are heard extremities: No clubbing; trace lower extremity edema present      The results of significant diagnostics from this hospitalization  (including imaging, microbiology, ancillary and laboratory) are listed below for reference.     Microbiology: Recent Results (from the past 240 hour(s))  Resp Panel by RT-PCR (Flu A&B, Covid) Nasopharyngeal Swab     Status: None   Collection Time: 04/17/21  3:26 PM   Specimen: Nasopharyngeal Swab; Nasopharyngeal(NP) swabs in vial transport medium  Result Value Ref Range Status   SARS Coronavirus 2 by RT PCR NEGATIVE NEGATIVE Final    Comment: (NOTE) SARS-CoV-2 target nucleic acids are NOT DETECTED.  The SARS-CoV-2 RNA is generally detectable in upper respiratory specimens during the acute phase of infection. The lowest concentration of SARS-CoV-2 viral copies this assay can detect is 138 copies/mL. A negative result does not preclude SARS-Cov-2 infection and should not be used as the sole basis for treatment or other patient management decisions. A negative result may occur with  improper specimen collection/handling, submission of specimen other than nasopharyngeal swab, presence of viral mutation(s) within the areas targeted by this assay, and inadequate number of viral copies(<138 copies/mL). A negative result must be combined with clinical observations, patient history, and epidemiological information. The expected result is Negative.  Fact Sheet for Patients:  BloggerCourse.com  Fact Sheet for Healthcare Providers:  SeriousBroker.it  This test is no t yet approved or cleared by the [place] FDA and  has been authorized for detection and/or diagnosis of SARS-CoV-2 by FDA under an Emergency Use Authorization (EUA). This EUA will remain  in effect (meaning this test can be used) for the duration of the COVID-19 declaration under Section 564(b)(1) of the Act, 21 U.S.C.section 360bbb-3(b)(1), unless the authorization is terminated  or revoked sooner.       Influenza A by PCR NEGATIVE NEGATIVE Final   Influenza B by PCR  NEGATIVE NEGATIVE Final    Comment: (NOTE) The Xpert Xpress SARS-CoV-2/FLU/RSV plus assay is intended as an aid in the diagnosis of influenza from Nasopharyngeal swab specimens and should not be used as a sole basis for treatment. Nasal washings and aspirates are unacceptable for Xpert Xpress SARS-CoV-2/FLU/RSV testing.  Fact Sheet for Patients: BloggerCourse.com  Fact Sheet for Healthcare Providers: SeriousBroker.it  This test is not yet approved or cleared by the [place] FDA and has been authorized for detection and/or diagnosis of SARS-CoV-2 by FDA under an Emergency Use Authorization (EUA). This EUA will remain in effect (meaning this test can be used) for the duration of the COVID-19 declaration under Section 564(b)(1) of the Act, 21 U.S.C. section 360bbb-3(b)(1), unless the authorization is terminated or revoked.  Performed at [facility], [address], [place], [place] [address]      Labs: BNP (last 3 results) No results for input(s): BNP in the last 8760 hours. Basic Metabolic Panel: Recent Labs  Lab 04/12/21 0615  NA 137  K 4.6  CL 104  CO2 26  GLUCOSE 97  BUN 17  CREATININE 1.01  CALCIUM 9.9   Liver Function Tests: Recent Labs  Lab 04/12/21 0615  AST 30  ALT 23  ALKPHOS 108  BILITOT 0.4  PROT 8.3*  ALBUMIN 4.0   No results for input(s):  LIPASE, AMYLASE in the last 168 hours. No results for input(s): AMMONIA in the last 168 hours. CBC: Recent Labs  Lab 04/12/21 0615  WBC 9.1  HGB 15.6  HCT 46.9  MCV 92.9  PLT 196   Cardiac Enzymes: No results for input(s): CKTOTAL, CKMB, CKMBINDEX, TROPONINI in the last 168 hours. BNP: Invalid input(s): POCBNP CBG: No results for input(s): GLUCAP in the last 168 hours. D-Dimer No results for input(s): DDIMER in the last 72 hours. Hgb A1c No results for input(s): HGBA1C in the last 72 hours. Lipid Profile No results for  input(s): CHOL, HDL, LDLCALC, TRIG, CHOLHDL, LDLDIRECT in the last 72 hours. Thyroid function studies No results for input(s): TSH, T4TOTAL, T3FREE, THYROIDAB in the last 72 hours.  Invalid input(s): FREET3 Anemia work up No results for input(s): VITAMINB12, FOLATE, FERRITIN, TIBC, IRON, RETICCTPCT in the last 72 hours. Urinalysis    Component Value Date/Time   COLORURINE YELLOW 12/28/2020 0000   APPEARANCEUR HAZY (A) 12/28/2020 0000   LABSPEC 1.025 12/28/2020 0000   PHURINE 5.0 12/28/2020 0000   GLUCOSEU NEGATIVE 12/28/2020 0000   HGBUR NEGATIVE 12/28/2020 0000   BILIRUBINUR NEGATIVE 12/28/2020 0000   KETONESUR NEGATIVE 12/28/2020 0000   PROTEINUR NEGATIVE 12/28/2020 0000   UROBILINOGEN 1.0 05/31/2014 1841   NITRITE NEGATIVE 12/28/2020 0000   LEUKOCYTESUR NEGATIVE 12/28/2020 0000   Sepsis Labs Invalid input(s): PROCALCITONIN,  WBC,  LACTICIDVEN Microbiology Recent Results (from the past 240 hour(s))  Resp Panel by RT-PCR (Flu A&B, Covid) Nasopharyngeal Swab     Status: None   Collection Time: 04/17/21  3:26 PM   Specimen: Nasopharyngeal Swab; Nasopharyngeal(NP) swabs in vial transport medium  Result Value Ref Range Status   SARS Coronavirus 2 by RT PCR NEGATIVE NEGATIVE Final    Comment: (NOTE) SARS-CoV-2 target nucleic acids are NOT DETECTED.  The SARS-CoV-2 RNA is generally detectable in upper respiratory specimens during the acute phase of infection. The lowest concentration of SARS-CoV-2 viral copies this assay can detect is 138 copies/mL. A negative result does not preclude SARS-Cov-2 infection and should not be used as the sole basis for treatment or other patient management decisions. A negative result may occur with  improper specimen collection/handling, submission of specimen other than nasopharyngeal swab, presence of viral mutation(s) within the areas targeted by this assay, and inadequate number of viral copies(<138 copies/mL). A negative result must be  combined with clinical observations, patient history, and epidemiological information. The expected result is Negative.  Fact Sheet for Patients:  BloggerCourse.comhttps://www.fda.gov/media/152166/download  Fact Sheet for Healthcare Providers:  SeriousBroker.ithttps://www.fda.gov/media/152162/download  This test is no t yet approved or cleared by the [place] FDA and  has been authorized for detection and/or diagnosis of SARS-CoV-2 by FDA under an Emergency Use Authorization (EUA). This EUA will remain  in effect (meaning this test can be used) for the duration of the COVID-19 declaration under Section 564(b)(1) of the Act, 21 U.S.C.section 360bbb-3(b)(1), unless the authorization is terminated  or revoked sooner.       Influenza A by PCR NEGATIVE NEGATIVE Final   Influenza B by PCR NEGATIVE NEGATIVE Final    Comment: (NOTE) The Xpert Xpress SARS-CoV-2/FLU/RSV plus assay is intended as an aid in the diagnosis of influenza from Nasopharyngeal swab specimens and should not be used as a sole basis for treatment. Nasal washings and aspirates are unacceptable for Xpert Xpress SARS-CoV-2/FLU/RSV testing.  Fact Sheet for Patients: BloggerCourse.comhttps://www.fda.gov/media/152166/download  Fact Sheet for Healthcare Providers: SeriousBroker.ithttps://www.fda.gov/media/152162/download  This test is not yet approved or  cleared by the [place] and has been authorized for detection and/or diagnosis of SARS-CoV-2 by FDA under an Emergency Use Authorization (EUA). This EUA will remain in effect (meaning this test can be used) for the duration of the COVID-19 declaration under Section 564(b)(1) of the Act, 21 U.S.C. section 360bbb-3(b)(1), unless the authorization is terminated or revoked.  Performed at [facility], [address], [place], [place] [address]      Time coordinating discharge: 35 minutes  SIGNED:   Glade Lloyd, MD  [facility] 04/18/2021, 9:15 AM

## 2021-04-21 ENCOUNTER — Telehealth: Payer: Self-pay | Admitting: *Deleted
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
## 2021-05-26 ENCOUNTER — Other Ambulatory Visit: Payer: Self-pay

## 2021-05-26 ENCOUNTER — Encounter (HOSPITAL_COMMUNITY): Payer: Self-pay | Admitting: Emergency Medicine

## 2021-05-26 ENCOUNTER — Inpatient Hospital Stay (HOSPITAL_COMMUNITY)
Admission: EM | Admit: 2021-05-26 | Discharge: 2021-06-07 | DRG: 177 | Disposition: A | Discharge status: Skilled Nursing Facility | Payer: Medicaid Other | Source: Skilled Nursing Facility | Attending: Internal Medicine | Admitting: Internal Medicine

## 2021-05-26 ENCOUNTER — Emergency Department (HOSPITAL_COMMUNITY): Payer: Medicaid Other

## 2021-05-26 DIAGNOSIS — Z79899 Other long term (current) drug therapy: Secondary | ICD-10-CM

## 2021-05-26 DIAGNOSIS — I10 Essential (primary) hypertension: Secondary | ICD-10-CM

## 2021-05-26 DIAGNOSIS — F17290 Nicotine dependence, other tobacco product, uncomplicated: Secondary | ICD-10-CM | POA: Diagnosis present

## 2021-05-26 DIAGNOSIS — U071 COVID-19: Secondary | ICD-10-CM | POA: Diagnosis not present

## 2021-05-26 DIAGNOSIS — F0280 Dementia in other diseases classified elsewhere without behavioral disturbance: Secondary | ICD-10-CM | POA: Diagnosis present

## 2021-05-26 DIAGNOSIS — F17210 Nicotine dependence, cigarettes, uncomplicated: Secondary | ICD-10-CM | POA: Diagnosis present

## 2021-05-26 DIAGNOSIS — F1010 Alcohol abuse, uncomplicated: Secondary | ICD-10-CM

## 2021-05-26 DIAGNOSIS — J189 Pneumonia, unspecified organism: Secondary | ICD-10-CM

## 2021-05-26 DIAGNOSIS — R410 Disorientation, unspecified: Secondary | ICD-10-CM

## 2021-05-26 DIAGNOSIS — J1282 Pneumonia due to coronavirus disease 2019: Secondary | ICD-10-CM | POA: Diagnosis present

## 2021-05-26 DIAGNOSIS — F1027 Alcohol dependence with alcohol-induced persisting dementia: Secondary | ICD-10-CM | POA: Diagnosis present

## 2021-05-26 LAB — COMPREHENSIVE METABOLIC PANEL
ALT: 16 U/L (ref 0–44)
AST: 22 U/L (ref 15–41)
Albumin: 4.4 g/dL (ref 3.5–5.0)
Alkaline Phosphatase: 122 U/L (ref 38–126)
Anion gap: 9 (ref 5–15)
BUN: 12 mg/dL (ref 6–20)
CO2: 24 mmol/L (ref 22–32)
Calcium: 9.8 mg/dL (ref 8.9–10.3)
Chloride: 107 mmol/L (ref 98–111)
Creatinine, Ser: 1 mg/dL (ref 0.61–1.24)
GFR, Estimated: >60 mL/min (ref >60)
Glucose, Bld: 92 mg/dL (ref 70–99)
Potassium: 4 mmol/L (ref 3.5–5.1)
Sodium: 140 mmol/L (ref 135–145)
Total Bilirubin: 0.6 mg/dL (ref 0.3–1.2)
Total Protein: 8.5 g/dL — ABNORMAL HIGH (ref 6.5–8.1)

## 2021-05-26 LAB — RESP PANEL BY RT-PCR (FLU A&B, COVID) ARPGX2
Influenza A by PCR: NEGATIVE
Influenza B by PCR: NEGATIVE
SARS Coronavirus 2 by RT PCR: POSITIVE — AB

## 2021-05-26 LAB — RAPID URINE DRUG SCREEN, HOSP PERFORMED
Amphetamines: NOT DETECTED
Barbiturates: NOT DETECTED
Benzodiazepines: NOT DETECTED
Cocaine: NOT DETECTED
Opiates: NOT DETECTED
Tetrahydrocannabinol: NOT DETECTED

## 2021-05-26 LAB — CBC WITH DIFFERENTIAL/PLATELET
Abs Immature Granulocytes: 0.04 10*3/uL (ref 0.00–0.07)
Basophils Absolute: 0 10*3/uL (ref 0.0–0.1)
Basophils Relative: 0 %
Eosinophils Absolute: 0.2 10*3/uL (ref 0.0–0.5)
Eosinophils Relative: 3 %
HCT: 50.8 % (ref 39.0–52.0)
Hemoglobin: 17.1 g/dL — ABNORMAL HIGH (ref 13.0–17.0)
Immature Granulocytes: 0 %
Lymphocytes Relative: 32 %
Lymphs Abs: 2.9 10*3/uL (ref 0.7–4.0)
MCH: 30.2 pg (ref 26.0–34.0)
MCHC: 33.7 g/dL (ref 30.0–36.0)
MCV: 89.6 fL (ref 80.0–100.0)
Monocytes Absolute: 0.9 10*3/uL (ref 0.1–1.0)
Monocytes Relative: 10 %
Neutro Abs: 5 10*3/uL (ref 1.7–7.7)
Neutrophils Relative %: 55 %
Platelets: 197 10*3/uL (ref 150–400)
RBC: 5.67 MIL/uL (ref 4.22–5.81)
RDW: 12.6 % (ref 11.5–15.5)
WBC: 9.1 10*3/uL (ref 4.0–10.5)
nRBC: 0 % (ref 0.0–0.2)

## 2021-05-26 LAB — URINALYSIS, ROUTINE W REFLEX MICROSCOPIC
Bilirubin Urine: NEGATIVE
Glucose, UA: NEGATIVE mg/dL
Hgb urine dipstick: NEGATIVE
Ketones, ur: NEGATIVE mg/dL
Leukocytes,Ua: NEGATIVE
Nitrite: NEGATIVE
Protein, ur: NEGATIVE mg/dL
Specific Gravity, Urine: 1.02 (ref 1.005–1.030)
pH: 5 (ref 5.0–8.0)

## 2021-05-26 LAB — AMMONIA: Ammonia: 16 umol/L (ref 9–35)

## 2021-05-26 LAB — ETHANOL
Alcohol, Ethyl (B): <10 mg/dL (ref <10)
Alcohol, Ethyl (B): <10 mg/dL (ref <10)

## 2021-05-26 LAB — CBG MONITORING, ED: Glucose-Capillary: 105 mg/dL — ABNORMAL HIGH (ref 70–99)

## 2021-05-26 MED ORDER — SODIUM CHLORIDE 0.9 % IV SOLN: INTRAVENOUS | Status: DC

## 2021-05-26 NOTE — ED Triage Notes (Signed)
Pt reports generalized body aches, "memory lapses."  Unsure for how long.  Pt AOx3 during triage, unsure to time.

## 2021-05-26 NOTE — ED Provider Notes (Addendum)
[facility]-EMERGENCY DEPT Provider Note   CSN: [id number] Arrival date & time: 05/26/21  1245     History Chief Complaint  Patient presents with   Generalized Body Aches   Altered Mental Status    Alvin Clark is a 60 y.o. male.  HPI Patient's daughters are with the patient at bedside.  They report patient was in nursing facility for dementia associated with chronic alcohol abuse.  Reportedly the patient was brought to the emergency department to the triage area from his facility and dropped off.  Patient's daughters report they were not even initially aware that he had been brought to the emergency department, and did not get explanation from the nursing facility as to why the patient was brought.  When they came, the patient was not in the emergency department he had registered but then left from the department.  They suspected they do where he had gone and were able to find the patient back at his old house.  They are not sure how he got there.  They report this had been a family home that he lived in for many years and then lived there after his mother died with his brother then subsequently his brother died and the patient ultimately was evicted.  He however had gone back a number of times trespassing and was not allowed to be back at that house.  Patient does not remember how he got there or that he went.  Patient also does not recall being brought from the nursing facility or why he was brought here.  He does report that he has felt kind of unwell for several days.  He has been very fatigued.  He reports has been coughing some.  Denies vomiting and diarrhea.  Denies chest pain.  Patient's daughter reports that at baseline he has very little recall for time dates and places.  He reports his dementia was determined to be alcohol induced.    Past Medical History:  Diagnosis Date   Alcohol use     Patient Active Problem List   Diagnosis Date Noted   Acute metabolic  encephalopathy    Hypokalemia    Hypomagnesemia    Hypophosphatemia    Fall    Debility    Low vitamin B12 level    Chronic alcohol use    Protein-calorie malnutrition, severe 12/29/2020   Withdrawal symptoms, alcohol (HCC) 12/28/2020   AKI (acute kidney injury) (HCC) 12/27/2020   Confusion 12/27/2020   Leukocytosis 12/27/2020   Blurry vision, bilateral 12/27/2020   Transaminitis 12/27/2020   Constipation 12/27/2020    History reviewed. No pertinent surgical history.     Family History  Problem Relation Age of Onset   Cancer Mother    Hypertension Father    Heart disease Father     Social History   Tobacco Use   Smoking status: Every Day    Packs/day: 1.00    Types: Cigars, Cigarettes   Smokeless tobacco: Never  Substance Use Topics   Alcohol use: Yes    Alcohol/week: 21.0 standard drinks    Types: 7 Cans of beer, 14 Shots of liquor per week   Drug use: Yes    Frequency: 1.0 times per week    Types: Cocaine, Marijuana    Home Medications Prior to Admission medications   Medication Sig Start Date End Date Taking? Authorizing Provider  carvedilol (COREG) 6.25 MG tablet Take 1 tablet (6.25 mg total) by mouth 2 (two) times daily with  a meal. 04/18/21   Glade Lloyd, MD  folic acid (FOLVITE) 1 MG tablet Take 1 tablet (1 mg total) by mouth daily. 04/19/21   Glade Lloyd, MD  Multiple Vitamin (MULTIVITAMIN WITH MINERALS) TABS tablet Take 1 tablet by mouth daily. 04/19/21   Glade Lloyd, MD  QUEtiapine (SEROQUEL) 25 MG tablet Take 1 tablet (25 mg total) by mouth 2 (two) times daily. 04/18/21   Glade Lloyd, MD  thiamine 100 MG tablet Take 1 tablet (100 mg total) by mouth daily. 04/19/21   Glade Lloyd, MD  vitamin B-12 1000 MCG tablet Take 1 tablet (1,000 mcg total) by mouth daily. 04/19/21   Glade Lloyd, MD    Allergies    Patient has no known allergies.  Review of Systems   Review of Systems 10 systems reviewed and negative except as per HPI Physical  Exam Updated Vital Signs BP (!) 139/96   Pulse 95   Temp 99 F (37.2 C)   Resp 19   SpO2 98%   Physical Exam Constitutional:      Appearance: Normal appearance.  HENT:     Mouth/Throat:     Pharynx: Oropharynx is clear.  Eyes:     Extraocular Movements: Extraocular movements intact.  Cardiovascular:     Rate and Rhythm: Normal rate and regular rhythm.  Pulmonary:     Effort: Pulmonary effort is normal.     Breath sounds: Normal breath sounds.  Abdominal:     General: There is no distension.     Palpations: Abdomen is soft.     Tenderness: There is no abdominal tenderness.  Skin:    General: Skin is warm and dry.  Neurological:     General: No focal deficit present.     Mental Status: He is alert.  Psychiatric:        Mood and Affect: Mood normal.    ED Results / Procedures / Treatments   Labs (all labs ordered are listed, but only abnormal results are displayed) Labs Reviewed  RESP PANEL BY RT-PCR (FLU A&B, COVID) ARPGX2 - Abnormal; Notable for the following components:      Result Value   SARS Coronavirus 2 by RT PCR POSITIVE (*)    All other components within normal limits  COMPREHENSIVE METABOLIC PANEL - Abnormal; Notable for the following components:   Total Protein 8.5 (*)    All other components within normal limits  CBC WITH DIFFERENTIAL/PLATELET - Abnormal; Notable for the following components:   Hemoglobin 17.1 (*)    All other components within normal limits  CBG MONITORING, ED - Abnormal; Notable for the following components:   Glucose-Capillary 105 (*)    All other components within normal limits  ETHANOL  RAPID URINE DRUG SCREEN, HOSP PERFORMED  URINALYSIS, ROUTINE W REFLEX MICROSCOPIC  AMMONIA  ETHANOL    EKG EKG Interpretation  Date/Time:  Saturday May 26 2021 20:22:54 EDT Ventricular Rate:  102 PR Interval:  149 QRS Duration: 84 QT Interval:  337 QTC Calculation: 439 R Axis:   106 Text Interpretation: Sinus tachycardia Low  voltage with right axis deviation no change from previous Confirmed by Arby Barrette [phone]) on 05/26/2021 8:41:55 PM  Radiology CT HEAD WO CONTRAST ( )  Result Date: 05/26/2021 CLINICAL DATA:  Mental status changes. Having difficulty remembering things. EXAM: CT HEAD WITHOUT CONTRAST TECHNIQUE: Contiguous axial images were obtained from the base of the skull through the vertex without intravenous contrast. COMPARISON:  12/28/2020 FINDINGS: Brain: There is mild central and cortical atrophy.  Minimal periventricular white matter changes are consistent with small vessel disease. There is no intra or extra-axial fluid collection or mass lesion. The basilar cisterns and ventricles have a normal appearance. There is no CT evidence for acute infarction or hemorrhage. Vascular: There is atherosclerotic calcification of the internal carotid arteries. No hyperdense vessels. Skull: Normal. Negative for fracture or focal lesion. Sinuses/Orbits: There is mucosal thickening of the paranasal sinuses. No air-fluid levels or sinus wall fracture identified. Other: There is cerumen within the RIGHTexternal auditory canal. IMPRESSION: 1. Atrophy and small vessel disease. 2.  No evidence for acute intracranial abnormality. 3. Impacted cerumen within the RIGHT external auditory canal. Electronically Signed   By: Norva Pavlov M.D.   On: 05/26/2021 15:16    Procedures Procedures   Medications Ordered in ED Medications  0.9 %  sodium chloride infusion (has no administration in time range)    ED Course  I have reviewed the triage vital signs and the nursing notes.  Pertinent labs & imaging results that were available during my care of the patient were reviewed by me and considered in my medical decision making (see chart for details).    MDM Rules/Calculators/A&P                           Patient presents with a complex history.  Unfortunately, there is not a lot of history about what precipitated the patient's  first presentation to the emergency department.  Patient himself but endorses extreme fatigue and coughing and shortness of breath.  Does test positive for COVID.  Complicating his presentation is the fact that he has alcohol induced dementia and cannot give a lot of additional background history.  Patient's daughters had not seen him for several weeks leading up to this.  They are at bedside with him at this time.  They report there is underlying dementia but he has seemed more confused than baseline.  At this time plan for admission for COVID with acute on chronic confusion with history of alcohol induced dementia.  No clear onset of time of COVID symptoms due to patient's lack of cognizance around timing. Final Clinical Impression(s) / ED Diagnoses Final diagnoses:  COVID  Confusion    Rx / DC Orders ED Discharge Orders     None        Arby Barrette, MD 05/26/21 4128    Arby Barrette, MD 06/23/21 1436

## 2021-05-26 NOTE — ED Notes (Signed)
MD at bedside. 

## 2021-05-26 NOTE — ED Provider Notes (Signed)
Emergency Medicine Provider Triage Evaluation Note  Alvin Clark , a 60 y.o. male  was evaluated in triage.  Pt complains of "going in and out.".  When patient clarifies he states that he is having trouble remembering things.  Patient states that this started this morning.  Denies any recent falls or injuries.  Endorses daily alcohol use.  States that he normally drinks a pint of alcohol daily.  Patient states that he has not had anything to drink in the last 3 days.  Denies any illicit drug use.  Denies any numbness, weakness, headache, facial asymmetry, slurred speech, chest pain, shortness of breath, abdominal pain, nausea, vomiting, fevers, chills.  Review of Systems  Positive: umbness, weakness, headache, facial asymmetry, slurred speech, chest pain, shortness of breath, abdominal pain, nausea, vomiting, fevers, chills.  Negative: umbness, weakness, headache, facial asymmetry, slurred speech, chest pain, shortness of breath, abdominal pain, nausea, vomiting, fevers, chills.   Physical Exam  BP (!) 144/105   Pulse (!) 103   Temp 99 F (37.2 C)   Resp 18   SpO2 98%  Gen:   Awake, no distress, patient is alert to person and place only Resp:  Normal effort  MSK:   Moves extremities without difficulty  Other:  No facial asymmetry or slurred speech.  +5 strength to upper and lower extremities bilaterally.  EOM intact bilaterally.  Pupils PERRL.  Head is atraumatic.  Medical Decision Making  Medically screening exam initiated at 2:15 PM.  Appropriate orders placed.  Alvin Clark was informed that the remainder of the evaluation will be completed by another provider, this initial triage assessment does not replace that evaluation, and the importance of remaining in the ED until their evaluation is complete.  The patient appears stable so that the remainder of the work up may be completed by another provider.      Alvin Schroeder, PA-C 05/26/21 1417    Alvin Munch,  MD 05/26/21 [phone]

## 2021-05-26 NOTE — H&P (Signed)
History and Physical    Alvin Clark HUD:[id number] DOB: 1960/11/11 DOA: 05/26/2021  PCP: Patient, No Pcp Per (Inactive)  Patient coming from: Group home  I have personally briefly reviewed patient's old medical records in [facility]  Chief Complaint: Cough, shortness of breath  HPI: Alvin Clark is a 60 y.o. 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  History is limited from patient due to dementia and is otherwise obtained from EDP and chart review.  Per ED physician and ED nursing staff, patient was dropped off from his living facility without any clear explanation or reasoning.  There was some report of cough, shortness of breath and question of change in mental status compared to his baseline dementia.  Patient did have recent prolonged hospitalization, initially admitted on 12/27/2020 for altered mental status.  This was ultimately felt related to dementia due to chronic alcohol abuse.  Hospitalization was prolonged due to unsafe discharge planning.  He was eventually discharged to [facility] assisted living facility in [place], [place] which is where he presumably came from today.  Patient is otherwise a limited historian due to apparent short-term memory deficits.  He is oriented to self and knows that he is in the hospital but not oriented to situation or year.  He cannot tell me where he was staying prior to today or why he is in the hospital.  He has no specific complaints at this time.  He says that he normally drinks a pint of liquor each night but cannot tell me when he had his last drink.  He says he has not had any drinks while he has been here in the hospital which he approximates to be 2 weeks.  ED Course:  Initial vitals showed BP 144/105, pulse 103, RR 18, temp 99.0 F, SPO2 98% on room air.  Labs significant for positive SARS-CoV-2 PCR.  Influenza  is negative.  Sodium 140, potassium 4.0, bicarb 24, BUN 12, creatinine 1.00, serum glucose 92, LFTs within normal limits, WBC 9.1, hemoglobin 17.1, platelets 197,000, ammonia 16.  Serum ethanol <10.  Urinalysis negative for UTI.  UDS negative.  CT head without contrast shows atrophy and small vessel disease without evidence of acute intracranial abnormality.  Portable chest x-ray shows patchy density at the left lung base.  Patient was placed on IV normal saline continuous infusion.  The hospitalist service was consulted to admit for further evaluation and management.  Review of Systems:  All systems reviewed and are negative except as documented in history of present illness above.   Past Medical History:  Diagnosis Date   Alcohol use     History reviewed. No pertinent surgical history.  Social History:  reports that he has been smoking cigars. He has been smoking an average of 1 pack per day. He has never used smokeless tobacco. He reports current alcohol use of about 21.0 standard drinks of alcohol per week. He reports current drug use. Frequency: 1.00 time per week. Drugs: Cocaine and Marijuana.  No Known Allergies  Family History  Problem Relation Age of Onset   Cancer Mother    Hypertension Father    Heart disease Father      Prior to Admission medications   Medication Sig Start Date End Date Taking? Authorizing Provider  carvedilol (COREG) 6.25 MG tablet Take 1 tablet (6.25 mg total) by mouth 2 (two) times daily with  a meal. 04/18/21   Glade Lloyd, MD  folic acid (FOLVITE) 1 MG tablet Take 1 tablet (1 mg total) by mouth daily. 04/19/21   Glade Lloyd, MD  Multiple Vitamin (MULTIVITAMIN WITH MINERALS) TABS tablet Take 1 tablet by mouth daily. 04/19/21   Glade Lloyd, MD  QUEtiapine (SEROQUEL) 25 MG tablet Take 1 tablet (25 mg total) by mouth 2 (two) times daily. 04/18/21   Glade Lloyd, MD  thiamine 100 MG tablet Take 1 tablet (100 mg total) by mouth daily. 04/19/21    Glade Lloyd, MD  vitamin B-12 1000 MCG tablet Take 1 tablet (1,000 mcg total) by mouth daily. 04/19/21   Glade Lloyd, MD    Physical Exam: Vitals:   05/26/21 2030 05/26/21 2144 05/26/21 2230 05/26/21 2330  BP: (!) 145/93 (!) 145/101 (!) 139/96 (!) 136/96  Pulse: (!) 105 (!) 102 95 (!) 102  Resp: (!) 28 19 19 18   Temp:      SpO2: 100% 99% 98% 97%   Constitutional: Resting in bed, NAD, calm, comfortable Eyes: PERRL, lids and conjunctivae normal ENMT: Mucous membranes are moist. Posterior pharynx clear of any exudate or lesions.poor dentition.  Neck: normal, supple, no masses. Respiratory: clear to auscultation bilaterally, no wheezing, no crackles. Normal respiratory effort. No accessory muscle use.  Cardiovascular: Mildly tachycardic with regular rhythm, no murmurs / rubs / gallops. No extremity edema. 2+ pedal pulses. Abdomen: no tenderness, no masses palpated. No hepatosplenomegaly. Bowel sounds positive.  Musculoskeletal: no clubbing / cyanosis. No joint deformity upper and lower extremities. Good ROM, no contractures. Normal muscle tone.  Skin: no rashes, lesions, ulcers. No induration Neurologic: CN 2-12 grossly intact. Sensation intact. Strength 5/5 in all 4.  Psychiatric: Awake, alert, oriented to self.  Knows that he is in the hospital.  Not oriented to year or situation.  Exhibits impaired short-term memory.  Labs on Admission: I have personally reviewed following labs and imaging studies  CBC: Recent Labs  Lab 05/26/21 1503  WBC 9.1  NEUTROABS 5.0  HGB 17.1*  HCT 50.8  MCV 89.6  PLT 197   Basic Metabolic Panel: Recent Labs  Lab 05/26/21 1503  NA 140  K 4.0  CL 107  CO2 24  GLUCOSE 92  BUN 12  CREATININE 1.00  CALCIUM 9.8   GFR: CrCl cannot be calculated (Unknown ideal weight.). Liver Function Tests: Recent Labs  Lab 05/26/21 1503  AST 22  ALT 16  ALKPHOS 122  BILITOT 0.6  PROT 8.5*  ALBUMIN 4.4   No results for input(s): LIPASE, AMYLASE  in the last 168 hours. Recent Labs  Lab 05/26/21 1503  AMMONIA 16   Coagulation Profile: No results for input(s): INR, PROTIME in the last 168 hours. Cardiac Enzymes: No results for input(s): CKTOTAL, CKMB, CKMBINDEX, TROPONINI in the last 168 hours. BNP (last 3 results) No results for input(s): PROBNP in the last 8760 hours. HbA1C: No results for input(s): HGBA1C in the last 72 hours. CBG: Recent Labs  Lab 05/26/21 2003  GLUCAP 105*   Lipid Profile: No results for input(s): CHOL, HDL, LDLCALC, TRIG, CHOLHDL, LDLDIRECT in the last 72 hours. Thyroid Function Tests: No results for input(s): TSH, T4TOTAL, FREET4, T3FREE, THYROIDAB in the last 72 hours. Anemia Panel: No results for input(s): VITAMINB12, FOLATE, FERRITIN, TIBC, IRON, RETICCTPCT in the last 72 hours. Urine analysis:    Component Value Date/Time   COLORURINE YELLOW 05/26/2021 2000   APPEARANCEUR CLEAR 05/26/2021 2000   LABSPEC 1.020 05/26/2021 2000   PHURINE 5.0  05/26/2021 2000   GLUCOSEU NEGATIVE 05/26/2021 2000   HGBUR NEGATIVE 05/26/2021 2000   BILIRUBINUR NEGATIVE 05/26/2021 2000   KETONESUR NEGATIVE 05/26/2021 2000   PROTEINUR NEGATIVE 05/26/2021 2000   UROBILINOGEN 1.0 05/31/2014 1841   NITRITE NEGATIVE 05/26/2021 2000   LEUKOCYTESUR NEGATIVE 05/26/2021 2000    Radiological Exams on Admission: CT HEAD WO CONTRAST ( )  Result Date: 05/26/2021 CLINICAL DATA:  Mental status changes. Having difficulty remembering things. EXAM: CT HEAD WITHOUT CONTRAST TECHNIQUE: Contiguous axial images were obtained from the base of the skull through the vertex without intravenous contrast. COMPARISON:  12/28/2020 FINDINGS: Brain: There is mild central and cortical atrophy. Minimal periventricular white matter changes are consistent with small vessel disease. There is no intra or extra-axial fluid collection or mass lesion. The basilar cisterns and ventricles have a normal appearance. There is no CT evidence for acute  infarction or hemorrhage. Vascular: There is atherosclerotic calcification of the internal carotid arteries. No hyperdense vessels. Skull: Normal. Negative for fracture or focal lesion. Sinuses/Orbits: There is mucosal thickening of the paranasal sinuses. No air-fluid levels or sinus wall fracture identified. Other: There is cerumen within the RIGHTexternal auditory canal. IMPRESSION: 1. Atrophy and small vessel disease. 2.  No evidence for acute intracranial abnormality. 3. Impacted cerumen within the RIGHT external auditory canal. Electronically Signed   By: Norva Pavlov M.D.   On: 05/26/2021 15:16    EKG: Personally reviewed. Sinus tachycardia, rate 102, low voltage.  Rate slightly faster when compared to prior.  Assessment/Plan Principal Problem:   COVID-19 virus infection Active Problems:   Dementia associated with alcoholism (HCC)   Essential hypertension   Alvin Clark is a 60 y.o. 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  COVID-19 viral infection: SARS-CoV-2 PCR positive 05/26/2021.  CXR shows patchy left lung base density which could indicate early infiltrate.  He is currently saturating well on room air. -Start IV remdesivir -If develops hypoxia then add steroids -Albuterol as needed  Alcohol associated dementia: Seems to be at his baseline mental status, exhibiting short-term memory impairment.  Serum ethanol undetectable. -Continue Seroquel 25 mg twice daily -Continue thiamine, folic acid, B12, MVM -CIWA checks -Delirium precautions -TOC consult for disposition (patient's daughter does not want him to return to previous facility)  Hypertension: Continue Coreg 6.25 mg twice daily.  DVT prophylaxis: Lovenox Code Status: Full code Family Communication: None present on admission Disposition Plan: From [facility] assisted living facility and likely discharge to other  facility. Consults called: None Level of care: Med-Surg Admission status:  Status is: Observation  The patient remains OBS appropriate and will d/c before 2 midnights.  Dispo: The patient is from: ALF              Anticipated d/c is to:  SNF versus ALF versus group home              Patient currently is not medically stable to d/c.   Difficult to place patient Yes   Darreld Mclean MD [facility]  If 7PM-7AM, please contact night-coverage [email or web address]  05/26/2021, 11:46 PM

## 2021-05-26 NOTE — ED Notes (Signed)
Patient calm and cooperative, denies any issues at this time. Visitors at the bedside.

## 2021-05-26 NOTE — Progress Notes (Signed)
Patient's legal guardian is his daughter, Donielle Radziewicz, at [phone]. She requests the doctor or CSW please contact her regarding any plans for discharge for her Dad. She wants to please make sure that her Dad doesn't go back to the same facility that he came from.

## 2021-05-27 DIAGNOSIS — R0602 Shortness of breath: Secondary | ICD-10-CM | POA: Diagnosis present

## 2021-05-27 DIAGNOSIS — Z79899 Other long term (current) drug therapy: Secondary | ICD-10-CM | POA: Diagnosis not present

## 2021-05-27 DIAGNOSIS — U071 COVID-19: Secondary | ICD-10-CM | POA: Diagnosis present

## 2021-05-27 DIAGNOSIS — J189 Pneumonia, unspecified organism: Secondary | ICD-10-CM | POA: Diagnosis not present

## 2021-05-27 DIAGNOSIS — F1027 Alcohol dependence with alcohol-induced persisting dementia: Secondary | ICD-10-CM | POA: Diagnosis present

## 2021-05-27 DIAGNOSIS — F17290 Nicotine dependence, other tobacco product, uncomplicated: Secondary | ICD-10-CM | POA: Diagnosis present

## 2021-05-27 DIAGNOSIS — I10 Essential (primary) hypertension: Secondary | ICD-10-CM | POA: Diagnosis present

## 2021-05-27 DIAGNOSIS — J1282 Pneumonia due to coronavirus disease 2019: Secondary | ICD-10-CM | POA: Diagnosis present

## 2021-05-27 DIAGNOSIS — F17210 Nicotine dependence, cigarettes, uncomplicated: Secondary | ICD-10-CM | POA: Diagnosis present

## 2021-05-27 DIAGNOSIS — F0280 Dementia in other diseases classified elsewhere without behavioral disturbance: Secondary | ICD-10-CM | POA: Diagnosis present

## 2021-05-27 LAB — PROCALCITONIN: Procalcitonin: 0.87 ng/mL

## 2021-05-27 LAB — FERRITIN: Ferritin: 95 ng/mL (ref 24–336)

## 2021-05-27 LAB — D-DIMER, QUANTITATIVE: D-Dimer, Quant: 0.47 ug/mL-FEU (ref 0.00–0.50)

## 2021-05-27 LAB — C-REACTIVE PROTEIN: CRP: 1.9 mg/dL — ABNORMAL HIGH (ref <1.0)

## 2021-05-27 MED ORDER — ACETAMINOPHEN 325 MG PO TABS
650.0000 mg | ORAL_TABLET | Freq: Four times a day (QID) | ORAL | Status: DC | PRN
  Administered 2021-05-27 – 2021-06-07 (×4): 650 mg via ORAL
  Filled 2021-05-27 (×5): qty 2

## 2021-05-27 MED ORDER — ZINC SULFATE 220 (50 ZN) MG PO CAPS
220.0000 mg | ORAL_CAPSULE | Freq: Every day | ORAL | Status: DC
  Administered 2021-05-27 – 2021-06-01 (×6): 220 mg via ORAL
  Filled 2021-05-27 (×6): qty 1

## 2021-05-27 MED ORDER — SODIUM CHLORIDE 0.9 % IV SOLN
100.0000 mg | Freq: Once | INTRAVENOUS | Status: AC
  Administered 2021-05-27: 100 mg via INTRAVENOUS
  Filled 2021-05-27: qty 20

## 2021-05-27 MED ORDER — THIAMINE HCL 100 MG PO TABS
100.0000 mg | ORAL_TABLET | Freq: Every day | ORAL | Status: DC
  Administered 2021-05-27 – 2021-06-07 (×12): 100 mg via ORAL
  Filled 2021-05-27 (×12): qty 1

## 2021-05-27 MED ORDER — ASCORBIC ACID 500 MG PO TABS
500.0000 mg | ORAL_TABLET | Freq: Every day | ORAL | Status: DC
  Administered 2021-05-27 – 2021-06-01 (×6): 500 mg via ORAL
  Filled 2021-05-27 (×6): qty 1

## 2021-05-27 MED ORDER — VITAMIN B-12 1000 MCG PO TABS
1000.0000 ug | ORAL_TABLET | Freq: Every day | ORAL | Status: DC
  Administered 2021-05-27 – 2021-06-07 (×12): 1000 ug via ORAL
  Filled 2021-05-27 (×12): qty 1

## 2021-05-27 MED ORDER — GUAIFENESIN-DM 100-10 MG/5ML PO SYRP: 10.0000 mL | ORAL_SOLUTION | ORAL | Status: DC | PRN

## 2021-05-27 MED ORDER — ONDANSETRON HCL 4 MG/2ML IJ SOLN: 4.0000 mg | Freq: Four times a day (QID) | INTRAMUSCULAR | Status: DC | PRN

## 2021-05-27 MED ORDER — ADULT MULTIVITAMIN W/MINERALS CH
1.0000 | ORAL_TABLET | Freq: Every day | ORAL | Status: DC
  Administered 2021-05-27 – 2021-06-07 (×12): 1 via ORAL
  Filled 2021-05-27 (×12): qty 1

## 2021-05-27 MED ORDER — SODIUM CHLORIDE 0.9 % IV SOLN: INTRAVENOUS | Status: AC

## 2021-05-27 MED ORDER — CARVEDILOL 6.25 MG PO TABS
6.2500 mg | ORAL_TABLET | Freq: Two times a day (BID) | ORAL | Status: DC
  Administered 2021-05-27 – 2021-06-07 (×24): 6.25 mg via ORAL
  Filled 2021-05-27 (×5): qty 1
  Filled 2021-05-27: qty 2
  Filled 2021-05-27 (×17): qty 1
  Filled 2021-05-27: qty 2

## 2021-05-27 MED ORDER — ONDANSETRON HCL 4 MG PO TABS: 4.0000 mg | ORAL_TABLET | Freq: Four times a day (QID) | ORAL | Status: DC | PRN

## 2021-05-27 MED ORDER — QUETIAPINE FUMARATE 25 MG PO TABS
25.0000 mg | ORAL_TABLET | Freq: Two times a day (BID) | ORAL | Status: DC
  Administered 2021-05-27 – 2021-06-07 (×25): 25 mg via ORAL
  Filled 2021-05-27 (×25): qty 1

## 2021-05-27 MED ORDER — FOLIC ACID 1 MG PO TABS
1.0000 mg | ORAL_TABLET | Freq: Every day | ORAL | Status: DC
  Administered 2021-05-27 – 2021-06-07 (×12): 1 mg via ORAL
  Filled 2021-05-27 (×12): qty 1

## 2021-05-27 MED ORDER — SENNOSIDES-DOCUSATE SODIUM 8.6-50 MG PO TABS: 1.0000 | ORAL_TABLET | Freq: Every evening | ORAL | Status: DC | PRN

## 2021-05-27 MED ORDER — SODIUM CHLORIDE 0.9 % IV SOLN
100.0000 mg | Freq: Every day | INTRAVENOUS | Status: AC
  Administered 2021-05-28 – 2021-05-29 (×2): 100 mg via INTRAVENOUS
  Filled 2021-05-27 (×2): qty 20

## 2021-05-27 MED ORDER — ALBUTEROL SULFATE HFA 108 (90 BASE) MCG/ACT IN AERS: 1.0000 | INHALATION_SPRAY | Freq: Four times a day (QID) | RESPIRATORY_TRACT | Status: DC | PRN

## 2021-05-27 MED ORDER — ENOXAPARIN SODIUM 40 MG/0.4ML IJ SOSY
40.0000 mg | PREFILLED_SYRINGE | INTRAMUSCULAR | Status: DC
  Administered 2021-05-27 – 2021-06-07 (×12): 40 mg via SUBCUTANEOUS
  Filled 2021-05-27 (×12): qty 0.4

## 2021-05-27 MED ORDER — SODIUM CHLORIDE 0.9 % IV SOLN: 100.0000 mg | Freq: Every day | INTRAVENOUS | Status: DC

## 2021-05-27 MED ORDER — SODIUM CHLORIDE 0.9 % IV SOLN: 200.0000 mg | Freq: Once | INTRAVENOUS | Status: DC

## 2021-05-27 NOTE — ED Notes (Signed)
Pt received dinner tray.

## 2021-05-27 NOTE — ED Notes (Signed)
Patient took off cardiac monitor leads again and was about to come out of room. Pt informed again he has covid and should remain in his room. Pt returned to his room and on stretcher. Cardiac leads replaced again.

## 2021-05-27 NOTE — Evaluation (Signed)
Physical Therapy Evaluation Patient Details Name: Alvin Clark MRN: [id number] DOB: 30-Nov-1960 Today's Date: 05/27/2021   History of Present Illness  Pt admitted from [facility] 2* SOB and now with positive COVID.  Pt with hx of dementia related to long term ETOH abuse  Clinical Impression  Pt admitted as above and presenting with functional mobility limitations 2* L knee pain limiting WB tolerance and adversely affecting balance.  Marked improvement with use of RW for stability and support.  Pt is from ALF and would benefit from follow up care at same level 2* apparent cognitive issues but per note, pt family is requesting a change to different facility when dc from [facility].    Follow Up Recommendations Other (comment) (ALF)    Equipment Recommendations  Other (comment) (TBD - unclear on what equipment pt has)    Recommendations for Other Services       Precautions / Restrictions Precautions Precautions: Fall Restrictions Weight Bearing Restrictions: No      Mobility  Bed Mobility Overal bed mobility: Modified Independent             General bed mobility comments: No physical assist supine<>sit    Transfers Overall transfer level: Needs assistance Equipment used: None Transfers: Sit to/from Stand Sit to Stand: Min guard         General transfer comment: Steady assist for balance  Ambulation/Gait Ambulation/Gait assistance: Min assist Gait Distance (Feet): 75 Feet Assistive device: Rolling walker (2 wheeled);1 person hand held assist Gait Pattern/deviations: Step-to pattern;Step-through pattern;Decreased step length - right;Decreased step length - left;Decreased stance time - left;Shuffle;Trunk flexed Gait velocity: decr   General Gait Details: Pt ambulated initially with HHA but favoring L LE and with limited WB tolerance.  Transitioned to RW with noted improvement in stability but with cues for posture, position from RW and safety  Stairs             Wheelchair Mobility    Modified Rankin (Stroke Patients Only)       Balance Overall balance assessment: Needs assistance Sitting-balance support: No upper extremity supported;Feet supported Sitting balance-Leahy Scale: Normal     Standing balance support: No upper extremity supported Standing balance-Leahy Scale: Fair                               Pertinent Vitals/Pain Pain Assessment: 0-10 Pain Score: 6  Pain Location: R knee - lateral aspect - increased with WB Pain Descriptors / Indicators: Grimacing;Guarding Pain Intervention(s): Limited activity within patient's tolerance;Monitored during session    Home Living Family/patient expects to be discharged to:: Assisted living                 Additional Comments: Poor historian - pt states he lives alone in a house but has been in ALF    Prior Function Level of Independence: Independent         Comments: Pt reports IND but poor historian     Hand Dominance   Dominant Hand: Right    Extremity/Trunk Assessment   Upper Extremity Assessment Upper Extremity Assessment: Overall WFL for tasks assessed    Lower Extremity Assessment Lower Extremity Assessment: Overall WFL for tasks assessed    Cervical / Trunk Assessment Cervical / Trunk Assessment: Normal  Communication   Communication: No difficulties  Cognition Arousal/Alertness: Awake/alert Behavior During Therapy: WFL for tasks assessed/performed Overall Cognitive Status: History of cognitive impairments - at baseline  General Comments      Exercises     Assessment/Plan    PT Assessment Patient needs continued PT services  PT Problem List Decreased activity tolerance;Decreased balance;Decreased mobility;Pain;Decreased safety awareness;Decreased knowledge of use of DME;Decreased cognition       PT Treatment Interventions DME instruction;Gait training;Functional mobility  training;Therapeutic activities;Therapeutic exercise;Balance training;Patient/family education;Cognitive remediation    PT Goals (Current goals can be found in the Care Plan section)  Acute Rehab PT Goals Patient Stated Goal: Get out of bed PT Goal Formulation: With patient Time For Goal Achievement: 06/10/21 Potential to Achieve Goals: Good    Frequency Min 2X/week   Barriers to discharge Other (comment) Pt at [facility] and per note, family is requesting a change in facility.    Co-evaluation               AM-PAC PT "6 Clicks" Mobility  Outcome Measure Help needed turning from your back to your side while in a flat bed without using bedrails?: None Help needed moving from lying on your back to sitting on the side of a flat bed without using bedrails?: None Help needed moving to and from a bed to a chair (including a wheelchair)?: A Little Help needed standing up from a chair using your arms (e.g., wheelchair or bedside chair)?: A Little Help needed to walk in hospital room?: A Little Help needed climbing 3-5 steps with a railing? : A Little 6 Click Score: 20    End of Session Equipment Utilized During Treatment: Gait belt Activity Tolerance: Patient limited by pain Patient left: in bed;with call bell/phone within reach Nurse Communication: Mobility status PT Visit Diagnosis: Unsteadiness on feet (R26.81);Difficulty in walking, not elsewhere classified (R26.2);Pain Pain - Right/Left: Left Pain - part of body: Knee    Time: 6945-0388 PT Time Calculation (min) (ACUTE ONLY): 25 min   Charges:   PT Evaluation $PT Eval Low Complexity: 1 Low PT Treatments $Gait Training: 8-22 mins        Mauro Kaufmann PT Acute Rehabilitation Services Pager [phone] Office [phone]   Kareema Keitt 05/27/2021, 12:30 PM

## 2021-05-27 NOTE — ED Notes (Signed)
Pt took off all of his cardiac monitor leads. Pt tried to come out of room; pt was informed he has covid and needed to return to room. Cardiac leads, blood pressure cuff and pulse ox placed back on patient. Patient informed why these leads should remain on him.

## 2021-05-27 NOTE — ED Notes (Signed)
Pt received breakfast tray 

## 2021-05-27 NOTE — Progress Notes (Signed)
PROGRESS NOTE    LAWRENCE MITCH  YFV:[id number] DOB: 1961-03-09 DOA: 05/26/2021 PCP: Patient, No Pcp Per (Inactive)   Brief Narrative:  HPI: ANANIAS KOLANDER is a 60 y.o. 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  History is limited from patient due to dementia and is otherwise obtained from EDP and chart review.  Per ED physician and ED nursing staff, patient was dropped off from his living facility without any clear explanation or reasoning.  There was some report of cough, shortness of breath and question of change in mental status compared to his baseline dementia.  Patient did have recent prolonged hospitalization, initially admitted on 12/27/2020 for altered mental status.  This was ultimately felt related to dementia due to chronic alcohol abuse.  Hospitalization was prolonged due to unsafe discharge planning.  He was eventually discharged to [facility] assisted living facility in [place], [place] which is where he presumably came from today.  Patient is otherwise a limited historian due to apparent short-term memory deficits.  He is oriented to self and knows that he is in the hospital but not oriented to situation or year.  He cannot tell me where he was staying prior to today or why he is in the hospital.  He has no specific complaints at this time.  He says that he normally drinks a pint of liquor each night but cannot tell me when he had his last drink.  He says he has not had any drinks while he has been here in the hospital which he approximates to be 2 weeks.   ED Course:  Initial vitals showed BP 144/105, pulse 103, RR 18, temp 99.0 F, SPO2 98% on room air.   Labs significant for positive SARS-CoV-2 PCR.  Influenza is negative.   Sodium 140, potassium 4.0, bicarb 24, BUN 12, creatinine 1.00, serum glucose 92, LFTs within normal limits, WBC 9.1, hemoglobin 17.1,  platelets 197,000, ammonia 16.  Serum ethanol <10.  Urinalysis negative for UTI.  UDS negative.   CT head without contrast shows atrophy and small vessel disease without evidence of acute intracranial abnormality.   Portable chest x-ray shows patchy density at the left lung base.   Patient was placed on IV normal saline continuous infusion.  The hospitalist service was consulted to admit for further evaluation and management.    Assessment & Plan:   Principal Problem:   COVID-19 virus infection Active Problems:   Dementia associated with alcoholism (HCC)   Essential hypertension  COVID-19 pneumonia: Reportedly he had cough and shortness of breath.  However he denies any symptoms at this point in time.  Chest x-ray shows left lower base consolidation.  Checking procalcitonin to see if he has any bacterial superinfection.  Currently he is not hypoxic.  No other symptoms.  Continue remdesivir and start on antibiotics if procalcitonin elevated.  Hypertension: Controlled.  Continue Coreg.  Alcohol associated dementia: Patient is pleasantly confused.  Ethanol undetectable.  Continue Seroquel, thiamine B12 and vitamin.  Seems to be at his baseline.  DVT prophylaxis: enoxaparin (LOVENOX) injection 40 mg Start: 05/27/21 1000   Code Status: Full Code  Family Communication: His daughter Jalil Lorusso present at bedside.  Plan of care discussed with her.  Status is: Observation  The patient will require care spanning > 2 midnights and should be moved to inpatient because: Unsafe d/c plan  Dispo: The patient is  from: Group home              Anticipated d/c is to: Group home              Patient currently is not medically stable to d/c.   Difficult to place patient No        Estimated body mass index is 27.71 kg/m as calculated from the following:   Height as of 12/27/20: 5\' 9"  (1.753 m).   Weight as of 04/18/21: 85.1 kg.     Nutritional Assessment: There is no height or weight on  file to calculate BMI.. Seen by dietician.  I agree with the assessment and plan as outlined below: Nutrition Status:        .  Skin Assessment: I have examined the patient's skin and I agree with the wound assessment as performed by the wound care RN as outlined below:    Consultants:  None  Procedures:  None  Antimicrobials:  Anti-infectives (From admission, onward)    Start     Dose/Rate Route Frequency Ordered Stop   05/28/21 1000  remdesivir 100 mg in sodium chloride 0.9 % 100 mL IVPB       See Hyperspace for full Linked Orders Report.   100 mg 200 mL/hr over 30 Minutes Intravenous Daily 05/27/21 0031 05/30/21 0959   05/27/21 1000  remdesivir 100 mg in sodium chloride 0.9 % 100 mL IVPB  Status:  Discontinued       See Hyperspace for full Linked Orders Report.   100 mg 200 mL/hr over 30 Minutes Intravenous Daily 05/27/21 0022 05/27/21 0028   05/27/21 0115  remdesivir 100 mg in sodium chloride 0.9 % 100 mL IVPB       See Hyperspace for full Linked Orders Report.   100 mg 200 mL/hr over 30 Minutes Intravenous  Once 05/27/21 0031 05/27/21 0420   05/27/21 0030  remdesivir 200 mg in sodium chloride 0.9% 250 mL IVPB  Status:  Discontinued       See Hyperspace for full Linked Orders Report.   200 mg 580 mL/hr over 30 Minutes Intravenous Once 05/27/21 0022 05/27/21 0028   05/27/21 0030  remdesivir 100 mg in sodium chloride 0.9 % 100 mL IVPB       See Hyperspace for full Linked Orders Report.   100 mg 200 mL/hr over 30 Minutes Intravenous  Once 05/27/21 0031 05/27/21 0226          Subjective: Seen and examined.  He has no complaints.  He is pleasantly confused.  Objective: Vitals:   05/27/21 0600 05/27/21 0630 05/27/21 0700 05/27/21 0830  BP: (!) 130/98 (!) 129/94 117/90 128/78  Pulse: (!) 107 93 (!) 101 95  Resp: (!) 22 16 (!) 21 18  Temp:      SpO2: 94% 97% 97% 96%    Intake/Output Summary (Last 24 hours) at 05/27/2021 1056 Last data filed at 05/27/2021  0420 Gross per 24 hour  Intake 200 ml  Output --  Net 200 ml   There were no vitals filed for this visit.  Examination:  General exam: Appears calm and comfortable  Respiratory system: Clear to auscultation. Respiratory effort normal. Cardiovascular system: S1 & S2 heard, RRR. No JVD, murmurs, rubs, gallops or clicks. No pedal edema. Gastrointestinal system: Abdomen is nondistended, soft and nontender. No organomegaly or masses felt. Normal bowel sounds heard. Central nervous system: Alert and oriented x2. No focal neurological deficits. Extremities: Symmetric 5 x 5 power. Skin: No  rashes, lesions or ulcers Psychiatry: Judgement and insight appear poor.   Data Reviewed: I have personally reviewed following labs and imaging studies  CBC: Recent Labs  Lab 05/26/21 1503  WBC 9.1  NEUTROABS 5.0  HGB 17.1*  HCT 50.8  MCV 89.6  PLT 197   Basic Metabolic Panel: Recent Labs  Lab 05/26/21 1503  NA 140  K 4.0  CL 107  CO2 24  GLUCOSE 92  BUN 12  CREATININE 1.00  CALCIUM 9.8   GFR: CrCl cannot be calculated (Unknown ideal weight.). Liver Function Tests: Recent Labs  Lab 05/26/21 1503  AST 22  ALT 16  ALKPHOS 122  BILITOT 0.6  PROT 8.5*  ALBUMIN 4.4   No results for input(s): LIPASE, AMYLASE in the last 168 hours. Recent Labs  Lab 05/26/21 1503  AMMONIA 16   Coagulation Profile: No results for input(s): INR, PROTIME in the last 168 hours. Cardiac Enzymes: No results for input(s): CKTOTAL, CKMB, CKMBINDEX, TROPONINI in the last 168 hours. BNP (last 3 results) No results for input(s): PROBNP in the last 8760 hours. HbA1C: No results for input(s): HGBA1C in the last 72 hours. CBG: Recent Labs  Lab 05/26/21 2003  GLUCAP 105*   Lipid Profile: No results for input(s): CHOL, HDL, LDLCALC, TRIG, CHOLHDL, LDLDIRECT in the last 72 hours. Thyroid Function Tests: No results for input(s): TSH, T4TOTAL, FREET4, T3FREE, THYROIDAB in the last 72 hours. Anemia  Panel: No results for input(s): VITAMINB12, FOLATE, FERRITIN, TIBC, IRON, RETICCTPCT in the last 72 hours. Sepsis Labs: No results for input(s): PROCALCITON, LATICACIDVEN in the last 168 hours.  Recent Results (from the past 240 hour(s))  Resp Panel by RT-PCR (Flu A&B, Covid) Nasopharyngeal Swab     Status: Abnormal   Collection Time: 05/26/21  9:07 PM   Specimen: Nasopharyngeal Swab; Nasopharyngeal(NP) swabs in vial transport medium  Result Value Ref Range Status   SARS Coronavirus 2 by RT PCR POSITIVE (A) NEGATIVE Final    Comment: RESULT CALLED TO, READ BACK BY AND VERIFIED WITH: JAKE TALKINGTON AT 2241 ON 05/26/21 BY MAJ (NOTE) SARS-CoV-2 target nucleic acids are DETECTED.  The SARS-CoV-2 RNA is generally detectable in upper respiratory specimens during the acute phase of infection. Positive results are indicative of the presence of the identified virus, but do not rule out bacterial infection or co-infection with other pathogens not detected by the test. Clinical correlation with patient history and other diagnostic information is necessary to determine patient infection status. The expected result is Negative.  Fact Sheet for Patients: BloggerCourse.com  Fact Sheet for Healthcare Providers: SeriousBroker.it  This test is not yet approved or cleared by the [place] FDA and  has been authorized for detection and/or diagnosis of SARS-CoV-2 by FDA under an Emergency Use Authorization (EUA).  This EUA will remain in effect (meaning this te st can be used) for the duration of  the COVID-19 declaration under Section 564(b)(1) of the Act, 21 U.S.C. section 360bbb-3(b)(1), unless the authorization is terminated or revoked sooner.     Influenza A by PCR NEGATIVE NEGATIVE Final   Influenza B by PCR NEGATIVE NEGATIVE Final    Comment: (NOTE) The Xpert Xpress SARS-CoV-2/FLU/RSV plus assay is intended as an aid in the diagnosis  of influenza from Nasopharyngeal swab specimens and should not be used as a sole basis for treatment. Nasal washings and aspirates are unacceptable for Xpert Xpress SARS-CoV-2/FLU/RSV testing.  Fact Sheet for Patients: BloggerCourse.com  Fact Sheet for Healthcare Providers: SeriousBroker.it  This test  is not yet approved or cleared by the [place] FDA and has been authorized for detection and/or diagnosis of SARS-CoV-2 by FDA under an Emergency Use Authorization (EUA). This EUA will remain in effect (meaning this test can be used) for the duration of the COVID-19 declaration under Section 564(b)(1) of the Act, 21 U.S.C. section 360bbb-3(b)(1), unless the authorization is terminated or revoked.  Performed at [facility], [address] [place], [place] [address]       Radiology Studies: CT HEAD WO CONTRAST (5MM)  Result Date: 05/26/2021 CLINICAL DATA:  Mental status changes. Having difficulty remembering things. EXAM: CT HEAD WITHOUT CONTRAST TECHNIQUE: Contiguous axial images were obtained from the base of the skull through the vertex without intravenous contrast. COMPARISON:  12/28/2020 FINDINGS: Brain: There is mild central and cortical atrophy. Minimal periventricular white matter changes are consistent with small vessel disease. There is no intra or extra-axial fluid collection or mass lesion. The basilar cisterns and ventricles have a normal appearance. There is no CT evidence for acute infarction or hemorrhage. Vascular: There is atherosclerotic calcification of the internal carotid arteries. No hyperdense vessels. Skull: Normal. Negative for fracture or focal lesion. Sinuses/Orbits: There is mucosal thickening of the paranasal sinuses. No air-fluid levels or sinus wall fracture identified. Other: There is cerumen within the RIGHTexternal auditory canal. IMPRESSION: 1. Atrophy and small vessel disease. 2.  No  evidence for acute intracranial abnormality. 3. Impacted cerumen within the RIGHT external auditory canal. Electronically Signed   By: Norva PavlovElizabeth  Brown M.D.   On: 05/26/2021 15:16   DG Chest Port 1 View  Result Date: 05/26/2021 CLINICAL DATA:  COVID, altered mental status, body aches EXAM: PORTABLE CHEST 1 VIEW COMPARISON:  None. FINDINGS: Heart is normal size. Right lung clear. Patchy density at the left lung base. No effusions or pneumothorax. No acute bony abnormality. IMPRESSION: Patchy density at the left base could reflect early infiltrate/pneumonia. Electronically Signed   By: Charlett NoseKevin  Dover M.D.   On: 05/26/2021 23:52    Scheduled Meds:  vitamin C  500 mg Oral Daily   carvedilol  6.25 mg Oral BID WC   enoxaparin (LOVENOX) injection  40 mg Subcutaneous Q24H   folic acid  1 mg Oral Daily   multivitamin with minerals  1 tablet Oral Daily   QUEtiapine  25 mg Oral BID   thiamine  100 mg Oral Daily   cyanocobalamin  1,000 mcg Oral Daily   zinc sulfate  220 mg Oral Daily   Continuous Infusions:  [START ON 05/28/2021] remdesivir 100 mg in NS 100 mL       LOS: 0 days   Time spent: 32 minutes   Hughie Clossavi Danella Philson, MD [facility]  05/27/2021, 10:56 AM   How to contact the [facility] Attending or Consulting provider 7A - 7P or covering provider during after hours 7P -7A, for this patient?  Check the care team in [facility] and look for a) attending/consulting TRH provider listed and b) the [facility] team listed. Page or secure chat 7A-7P. Log into [email or web address] and use [place]'s universal password to access. If you do not have the password, please contact the hospital operator. Locate the [facility] provider you are looking for under [facility] and page to a number that you can be directly reached. If you still have difficulty reaching the provider, please page the [facility] (Director on Call) for the Hospitalists listed on amion for assistance.

## 2021-05-27 NOTE — Progress Notes (Signed)
CSW spoke with Anitha at [facility] who confirms this patient is from the family care home in [place], [place]. Anitha states the patient was admitted to the [facility] on 04/17/2021 after being at [facility] for over 100 days. Anitha reports the patient was approved for Medicaid and disability shortly before he was [facility] to the [facility]. Anitha reports the patient's daughter Edgerrin Correia is his legal guardian, and also his payee. Anitha reports she has not received any payments for placement from [place]. CSW informed Anitha of patient's positive COVID test.  Edwin Dada, MSW, LCSW Transitions of Care  Clinical Social Worker II [phone]

## 2021-05-27 NOTE — Progress Notes (Signed)
Received pt on bed from ED, pt alert, oriented to self and place, no s/s of respiratory distress, O2 sat 96 % on RA, follow commands and will continue plan of care.  This RN notified daughter Cayde Held and able to gather admission information from her, pt daughter states that she will be here tomorrow morning and wanted to speak to a Child psychotherapist.

## 2021-05-27 NOTE — ED Notes (Signed)
Pt received lunch tray 

## 2021-05-28 ENCOUNTER — Encounter (HOSPITAL_COMMUNITY): Payer: Self-pay | Admitting: Internal Medicine

## 2021-05-28 DIAGNOSIS — U071 COVID-19: Secondary | ICD-10-CM | POA: Diagnosis not present

## 2021-05-28 LAB — CBC WITH DIFFERENTIAL/PLATELET
Abs Immature Granulocytes: 0.04 10*3/uL (ref 0.00–0.07)
Basophils Absolute: 0 10*3/uL (ref 0.0–0.1)
Basophils Relative: 0 %
Eosinophils Absolute: 0.3 10*3/uL (ref 0.0–0.5)
Eosinophils Relative: 3 %
HCT: 47.3 % (ref 39.0–52.0)
Hemoglobin: 15.8 g/dL (ref 13.0–17.0)
Immature Granulocytes: 0 %
Lymphocytes Relative: 37 %
Lymphs Abs: 3.5 10*3/uL (ref 0.7–4.0)
MCH: 30.2 pg (ref 26.0–34.0)
MCHC: 33.4 g/dL (ref 30.0–36.0)
MCV: 90.4 fL (ref 80.0–100.0)
Monocytes Absolute: 1 10*3/uL (ref 0.1–1.0)
Monocytes Relative: 11 %
Neutro Abs: 4.6 10*3/uL (ref 1.7–7.7)
Neutrophils Relative %: 49 %
Platelets: 181 10*3/uL (ref 150–400)
RBC: 5.23 MIL/uL (ref 4.22–5.81)
RDW: 12.8 % (ref 11.5–15.5)
WBC: 9.4 10*3/uL (ref 4.0–10.5)
nRBC: 0 % (ref 0.0–0.2)

## 2021-05-28 LAB — COMPREHENSIVE METABOLIC PANEL
ALT: 14 U/L (ref 0–44)
AST: 25 U/L (ref 15–41)
Albumin: 4 g/dL (ref 3.5–5.0)
Alkaline Phosphatase: 112 U/L (ref 38–126)
Anion gap: 9 (ref 5–15)
BUN: 16 mg/dL (ref 6–20)
CO2: 25 mmol/L (ref 22–32)
Calcium: 9.5 mg/dL (ref 8.9–10.3)
Chloride: 103 mmol/L (ref 98–111)
Creatinine, Ser: 0.97 mg/dL (ref 0.61–1.24)
GFR, Estimated: >60 mL/min (ref >60)
Glucose, Bld: 91 mg/dL (ref 70–99)
Potassium: 3.6 mmol/L (ref 3.5–5.1)
Sodium: 137 mmol/L (ref 135–145)
Total Bilirubin: 0.8 mg/dL (ref 0.3–1.2)
Total Protein: 7.8 g/dL (ref 6.5–8.1)

## 2021-05-28 MED ORDER — SODIUM CHLORIDE 0.9 % IV SOLN
500.0000 mg | INTRAVENOUS | Status: DC
  Administered 2021-05-28: 500 mg via INTRAVENOUS
  Filled 2021-05-28: qty 500

## 2021-05-28 MED ORDER — AZITHROMYCIN 250 MG PO TABS
500.0000 mg | ORAL_TABLET | Freq: Every day | ORAL | Status: AC
  Administered 2021-05-29 – 2021-06-01 (×4): 500 mg via ORAL
  Filled 2021-05-28 (×5): qty 2

## 2021-05-28 MED ORDER — CEFTRIAXONE SODIUM 1 G IJ SOLR
1.0000 g | INTRAMUSCULAR | Status: AC
  Administered 2021-05-28 – 2021-06-01 (×5): 1 g via INTRAVENOUS
  Filled 2021-05-28: qty 10
  Filled 2021-05-28: qty 1
  Filled 2021-05-28 (×3): qty 10

## 2021-05-28 NOTE — NC FL2 (Signed)
[place] MEDICAID FL2 LEVEL OF CARE SCREENING TOOL     IDENTIFICATION  Patient Name: Alvin Clark Birthdate: [birth year] Sex: male Admission Date (Current Location): 05/26/2021  [facility] [place] Number:  Producer, television/film/video and Address:  [facility],  [address] [place], [place] [address]      Provider Number: [id number]  Attending Physician Name and Address:  Hughie Closs, MD  Relative Name and Phone Number:  Khadeem, Rockett Daughter   [phone]    Current Level of Care: Hospital Recommended Level of Care: Assisted Living Facility Prior Approval Number:    Date Approved/Denied:   PASRR Number: [id number]  Discharge Plan: SNF    Current Diagnoses: Patient Active Problem List   Diagnosis Date Noted   COVID-19 virus infection 05/26/2021   Dementia associated with alcoholism (HCC) 05/26/2021   Essential hypertension 05/26/2021   Acute metabolic encephalopathy    Hypokalemia    Hypomagnesemia    Hypophosphatemia    Fall    Debility    Low vitamin B12 level    Chronic alcohol use    Protein-calorie malnutrition, severe 12/29/2020   Withdrawal symptoms, alcohol (HCC) 12/28/2020   AKI (acute kidney injury) (HCC) 12/27/2020   Confusion 12/27/2020   Leukocytosis 12/27/2020   Blurry vision, bilateral 12/27/2020   Transaminitis 12/27/2020   Constipation 12/27/2020    Orientation RESPIRATION BLADDER Height & Weight     Self, Place  Normal Continent Weight: 80 kg Height:  5\' 9"  (175.3 cm)  BEHAVIORAL SYMPTOMS/MOOD NEUROLOGICAL BOWEL NUTRITION STATUS      Continent Diet (Regular)  AMBULATORY STATUS COMMUNICATION OF NEEDS Skin   Supervision Verbally Normal                       Personal Care Assistance Level of Assistance  Bathing, Feeding, Dressing Bathing Assistance: Limited assistance Feeding assistance: Independent Dressing Assistance: Limited assistance     Functional Limitations Info  Sight, Hearing, Speech Sight Info:  Adequate Hearing Info: Adequate Speech Info: Adequate    SPECIAL CARE FACTORS FREQUENCY  PT (By licensed PT), OT (By licensed OT)     PT Frequency: Eval and Treat OT Frequency: Eval and Treat            Contractures Contractures Info: Not present    Additional Factors Info  Code Status, Allergies Code Status Info: FULL Allergies Info: No Known Allergies           Current Medications (05/28/2021):  This is the current hospital active medication list Current Facility-Administered Medications  Medication Dose Route Frequency Provider Last Rate Last Admin   acetaminophen (TYLENOL) tablet 650 mg  650 mg Oral Q6H PRN 07/28/2021, MD   650 mg at 05/27/21 2145   albuterol (VENTOLIN HFA) 108 (90 Base) MCG/ACT inhaler 1-2 puff  1-2 puff Inhalation Q6H PRN 2146, MD       ascorbic acid (VITAMIN C) tablet 500 mg  500 mg Oral Daily Charlsie Quest R, MD   500 mg at 05/28/21 0859   [START ON 05/29/2021] azithromycin (ZITHROMAX) tablet 500 mg  500 mg Oral Daily 07/29/2021 T, RPH       carvedilol (COREG) tablet 6.25 mg  6.25 mg Oral BID WC Len Childs R, MD   6.25 mg at 05/28/21 0859   cefTRIAXone (ROCEPHIN) 1 g in sodium chloride 0.9 % 100 mL IVPB  1 g Intravenous Q24H Pahwani, 07/28/21, MD 200 mL/hr at 05/28/21 1002 1 g at 05/28/21  1002   enoxaparin (LOVENOX) injection 40 mg  40 mg Subcutaneous Q24H Darreld Mclean R, MD   40 mg at 05/28/21 0859   folic acid (FOLVITE) tablet 1 mg  1 mg Oral Daily Darreld Mclean R, MD   1 mg at 05/28/21 0859   guaiFENesin-dextromethorphan (ROBITUSSIN DM) 100-10 MG/5ML syrup 10 mL  10 mL Oral Q4H PRN Charlsie Quest, MD       multivitamin with minerals tablet 1 tablet  1 tablet Oral Daily Charlsie Quest, MD   1 tablet at 05/28/21 0859   ondansetron (ZOFRAN) tablet 4 mg  4 mg Oral Q6H PRN Charlsie Quest, MD       Or   ondansetron (ZOFRAN) injection 4 mg  4 mg Intravenous Q6H PRN Charlsie Quest, MD       QUEtiapine (SEROQUEL) tablet 25 mg  25 mg  Oral BID Darreld Mclean R, MD   25 mg at 05/28/21 1191   remdesivir 100 mg in sodium chloride 0.9 % 100 mL IVPB  100 mg Intravenous Daily Darreld Mclean R, MD 200 mL/hr at 05/28/21 0919 100 mg at 05/28/21 0919   senna-docusate (Senokot-S) tablet 1 tablet  1 tablet Oral QHS PRN Charlsie Quest, MD       thiamine tablet 100 mg  100 mg Oral Daily Darreld Mclean R, MD   100 mg at 05/28/21 4782   vitamin B-12 (CYANOCOBALAMIN) tablet 1,000 mcg  1,000 mcg Oral Daily Darreld Mclean R, MD   1,000 mcg at 05/28/21 9562   zinc sulfate capsule 220 mg  220 mg Oral Daily Charlsie Quest, MD   220 mg at 05/28/21 1308     Discharge Medications: Please see discharge summary for a list of discharge medications.  Relevant Imaging Results:  Relevant Lab Results:   Additional Information SS#[id number]  [name], RN

## 2021-05-28 NOTE — Progress Notes (Signed)
PROGRESS NOTE    Alvin Clark  WUJ:[id number] DOB: April 17, 1961 DOA: 05/26/2021 PCP: Patient, No Pcp Per (Inactive)   Brief Narrative:  Alvin Clark is a 60 y.o. 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   Per ED physician and ED nursing staff, patient was dropped off from his living facility without any clear explanation or reasoning.  There was some report of cough, shortness of breath and question of change in mental status compared to his baseline dementia.  Patient did have recent prolonged hospitalization, initially admitted on 12/27/2020 for altered mental status.  This was ultimately felt related to dementia due to chronic alcohol abuse.  Hospitalization was prolonged due to unsafe discharge planning.  He was eventually discharged to [facility] assisted living facility in [place], [place] which is where he presumably came from today.  Upon arrival to ED, he was hemodynamically stable and was tested positive for COVID-19. CT head without contrast shows atrophy and small vessel disease without evidence of acute intracranial abnormality. Portable chest x-ray shows patchy density at the left lung base.  Admitted under hospitalist service   Assessment & Plan:   Principal Problem:   COVID-19 virus infection Active Problems:   Dementia associated with alcoholism (HCC)   Essential hypertension  COVID-19 pneumonia: Reportedly he had cough and shortness of breath.  However he denies any symptoms at this point in time.  Chest x-ray shows left lower base consolidation.  Procalcitonin 0.85.  Currently he is not hypoxic.  No other symptoms.  Continue remdesivir for total of 3 doses and add Rocephin and Zithromax today.  Continue bronchodilators.  Patient was encouraged to prone, out of bed to chair, to use incentive spirometry and flutter valve.  Hypertension: Controlled.   Continue Coreg.  Alcohol associated dementia: Patient is pleasantly confused.  Ethanol undetectable.  Continue Seroquel, thiamine B12 and vitamin.  Seems to be at his baseline.  DVT prophylaxis: enoxaparin (LOVENOX) injection 40 mg Start: 05/27/21 1000   Code Status: Full Code  Family Communication: None present at bedside today.    Status is: Observation  The patient will require care spanning > 2 midnights and should be moved to inpatient because: Unsafe d/c plan  Dispo: The patient is from: Group home              Anticipated d/c is to: Group home vs ALF, per TOC, he cannot go back to group home.  We will need to go to ALF but will need to complete 10 days of quarantine in the hospital before discharge.              Patient currently is not medically stable to d/c.   Difficult to place patient No        Estimated body mass index is 26.05 kg/m as calculated from the following:   Height as of this encounter: 5\' 9"  (1.753 m).   Weight as of this encounter: 80 kg.     Nutritional Assessment: Body mass index is 26.05 kg/m. Seen by dietician.  I agree with the assessment and plan as outlined below: Nutrition Status:     Consultants:  None  Procedures:  None  Antimicrobials:  Anti-infectives (From admission, onward)    Start     Dose/Rate Route Frequency Ordered Stop   05/29/21 1000  azithromycin (ZITHROMAX) tablet 500 mg        500  mg Oral Daily 05/28/21 1030     05/28/21 1000  remdesivir 100 mg in sodium chloride 0.9 % 100 mL IVPB       See Hyperspace for full Linked Orders Report.   100 mg 200 mL/hr over 30 Minutes Intravenous Daily 05/27/21 0031 05/30/21 0959   05/28/21 0900  cefTRIAXone (ROCEPHIN) 1 g in sodium chloride 0.9 % 100 mL IVPB        1 g 200 mL/hr over 30 Minutes Intravenous Every 24 hours 05/28/21 0750     05/28/21 0845  azithromycin (ZITHROMAX) 500 mg in sodium chloride 0.9 % 250 mL IVPB  Status:  Discontinued        500 mg 250 mL/hr over 60  Minutes Intravenous Every 24 hours 05/28/21 0750 05/28/21 1030   05/27/21 1000  remdesivir 100 mg in sodium chloride 0.9 % 100 mL IVPB  Status:  Discontinued       See Hyperspace for full Linked Orders Report.   100 mg 200 mL/hr over 30 Minutes Intravenous Daily 05/27/21 0022 05/27/21 0028   05/27/21 0115  remdesivir 100 mg in sodium chloride 0.9 % 100 mL IVPB       See Hyperspace for full Linked Orders Report.   100 mg 200 mL/hr over 30 Minutes Intravenous  Once 05/27/21 0031 05/27/21 0420   05/27/21 0030  remdesivir 200 mg in sodium chloride 0.9% 250 mL IVPB  Status:  Discontinued       See Hyperspace for full Linked Orders Report.   200 mg 580 mL/hr over 30 Minutes Intravenous Once 05/27/21 0022 05/27/21 0028   05/27/21 0030  remdesivir 100 mg in sodium chloride 0.9 % 100 mL IVPB       See Hyperspace for full Linked Orders Report.   100 mg 200 mL/hr over 30 Minutes Intravenous  Once 05/27/21 0031 05/27/21 0226          Subjective: Seen and examined.  No complaints.  Alert and pleasantly confused, due to his dementia, at his baseline.  Objective: Vitals:   05/27/21 1803 05/27/21 2000 05/28/21 0216 05/28/21 0644  BP:  (!) 137/92 (!) 129/94 127/83  Pulse:  95 84 81  Resp:  20  16  Temp: 98.2 F (36.8 C) 99.2 F (37.3 C) 98.5 F (36.9 C) 98.1 F (36.7 C)  TempSrc: Oral Oral Oral Oral  SpO2:  96% 96% 100%  Weight:      Height:        Intake/Output Summary (Last 24 hours) at 05/28/2021 1331 Last data filed at 05/28/2021 0900 Gross per 24 hour  Intake 480 ml  Output 300 ml  Net 180 ml    Filed Weights   05/27/21 1639  Weight: 80 kg    Examination:  General exam: Appears calm and comfortable  Respiratory system: Clear to auscultation. Respiratory effort normal. Cardiovascular system: S1 & S2 heard, RRR. No JVD, murmurs, rubs, gallops or clicks. No pedal edema. Gastrointestinal system: Abdomen is nondistended, soft and nontender. No organomegaly or masses felt.  Normal bowel sounds heard. Central nervous system: Alert and oriented x1. No focal neurological deficits. Extremities: Symmetric 5 x 5 power. Skin: No rashes, lesions or ulcers.  Psychiatry: Judgement and insight appear poor  Data Reviewed: I have personally reviewed following labs and imaging studies  CBC: Recent Labs  Lab 05/26/21 1503 05/28/21 0349  WBC 9.1 9.4  NEUTROABS 5.0 4.6  HGB 17.1* 15.8  HCT 50.8 47.3  MCV 89.6 90.4  PLT 197 181  Basic Metabolic Panel: Recent Labs  Lab 05/26/21 1503 05/28/21 0349  NA 140 137  K 4.0 3.6  CL 107 103  CO2 24 25  GLUCOSE 92 91  BUN 12 16  CREATININE 1.00 0.97  CALCIUM 9.8 9.5    GFR: Estimated Creatinine Clearance: 82 mL/min (by C-G formula based on SCr of 0.97 mg/dL). Liver Function Tests: Recent Labs  Lab 05/26/21 1503 05/28/21 0349  AST 22 25  ALT 16 14  ALKPHOS 122 112  BILITOT 0.6 0.8  PROT 8.5* 7.8  ALBUMIN 4.4 4.0    No results for input(s): LIPASE, AMYLASE in the last 168 hours. Recent Labs  Lab 05/26/21 1503  AMMONIA 16    Coagulation Profile: No results for input(s): INR, PROTIME in the last 168 hours. Cardiac Enzymes: No results for input(s): CKTOTAL, CKMB, CKMBINDEX, TROPONINI in the last 168 hours. BNP (last 3 results) No results for input(s): PROBNP in the last 8760 hours. HbA1C: No results for input(s): HGBA1C in the last 72 hours. CBG: Recent Labs  Lab 05/26/21 2003  GLUCAP 105*    Lipid Profile: No results for input(s): CHOL, HDL, LDLCALC, TRIG, CHOLHDL, LDLDIRECT in the last 72 hours. Thyroid Function Tests: No results for input(s): TSH, T4TOTAL, FREET4, T3FREE, THYROIDAB in the last 72 hours. Anemia Panel: Recent Labs    05/27/21 1107  FERRITIN 95   Sepsis Labs: Recent Labs  Lab 05/27/21 1027  PROCALCITON 0.87    Recent Results (from the past 240 hour(s))  Resp Panel by RT-PCR (Flu A&B, Covid) Nasopharyngeal Swab     Status: Abnormal   Collection Time: 05/26/21   9:07 PM   Specimen: Nasopharyngeal Swab; Nasopharyngeal(NP) swabs in vial transport medium  Result Value Ref Range Status   SARS Coronavirus 2 by RT PCR POSITIVE (A) NEGATIVE Final    Comment: RESULT CALLED TO, READ BACK BY AND VERIFIED WITH: JAKE TALKINGTON AT 2241 ON 05/26/21 BY MAJ (NOTE) SARS-CoV-2 target nucleic acids are DETECTED.  The SARS-CoV-2 RNA is generally detectable in upper respiratory specimens during the acute phase of infection. Positive results are indicative of the presence of the identified virus, but do not rule out bacterial infection or co-infection with other pathogens not detected by the test. Clinical correlation with patient history and other diagnostic information is necessary to determine patient infection status. The expected result is Negative.  Fact Sheet for Patients: BloggerCourse.com  Fact Sheet for Healthcare Providers: SeriousBroker.it  This test is not yet approved or cleared by the [place] FDA and  has been authorized for detection and/or diagnosis of SARS-CoV-2 by FDA under an Emergency Use Authorization (EUA).  This EUA will remain in effect (meaning this te st can be used) for the duration of  the COVID-19 declaration under Section 564(b)(1) of the Act, 21 U.S.C. section 360bbb-3(b)(1), unless the authorization is terminated or revoked sooner.     Influenza A by PCR NEGATIVE NEGATIVE Final   Influenza B by PCR NEGATIVE NEGATIVE Final    Comment: (NOTE) The Xpert Xpress SARS-CoV-2/FLU/RSV plus assay is intended as an aid in the diagnosis of influenza from Nasopharyngeal swab specimens and should not be used as a sole basis for treatment. Nasal washings and aspirates are unacceptable for Xpert Xpress SARS-CoV-2/FLU/RSV testing.  Fact Sheet for Patients: BloggerCourse.com  Fact Sheet for Healthcare  Providers: SeriousBroker.it  This test is not yet approved or cleared by the [place] FDA and has been authorized for detection and/or diagnosis of SARS-CoV-2 by FDA under an  Emergency Use Authorization (EUA). This EUA will remain in effect (meaning this test can be used) for the duration of the COVID-19 declaration under Section 564(b)(1) of the Act, 21 U.S.C. section 360bbb-3(b)(1), unless the authorization is terminated or revoked.  Performed at [facility], [address], [place], [place] [address]        Radiology Studies: CT HEAD WO CONTRAST ( )  Result Date: 05/26/2021 CLINICAL DATA:  Mental status changes. Having difficulty remembering things. EXAM: CT HEAD WITHOUT CONTRAST TECHNIQUE: Contiguous axial images were obtained from the base of the skull through the vertex without intravenous contrast. COMPARISON:  12/28/2020 FINDINGS: Brain: There is mild central and cortical atrophy. Minimal periventricular white matter changes are consistent with small vessel disease. There is no intra or extra-axial fluid collection or mass lesion. The basilar cisterns and ventricles have a normal appearance. There is no CT evidence for acute infarction or hemorrhage. Vascular: There is atherosclerotic calcification of the internal carotid arteries. No hyperdense vessels. Skull: Normal. Negative for fracture or focal lesion. Sinuses/Orbits: There is mucosal thickening of the paranasal sinuses. No air-fluid levels or sinus wall fracture identified. Other: There is cerumen within the RIGHTexternal auditory canal. IMPRESSION: 1. Atrophy and small vessel disease. 2.  No evidence for acute intracranial abnormality. 3. Impacted cerumen within the RIGHT external auditory canal. Electronically Signed   By: Norva Pavlov M.D.   On: 05/26/2021 15:16   DG Chest Port 1 View  Result Date: 05/26/2021 CLINICAL DATA:  COVID, altered mental status, body aches EXAM:  PORTABLE CHEST 1 VIEW COMPARISON:  None. FINDINGS: Heart is normal size. Right lung clear. Patchy density at the left lung base. No effusions or pneumothorax. No acute bony abnormality. IMPRESSION: Patchy density at the left base could reflect early infiltrate/pneumonia. Electronically Signed   By: Charlett Nose M.D.   On: 05/26/2021 23:52    Scheduled Meds:  vitamin C  500 mg Oral Daily   [START ON 05/29/2021] azithromycin  500 mg Oral Daily   carvedilol  6.25 mg Oral BID WC   enoxaparin (LOVENOX) injection  40 mg Subcutaneous Q24H   folic acid  1 mg Oral Daily   multivitamin with minerals  1 tablet Oral Daily   QUEtiapine  25 mg Oral BID   thiamine  100 mg Oral Daily   cyanocobalamin  1,000 mcg Oral Daily   zinc sulfate  220 mg Oral Daily   Continuous Infusions:  cefTRIAXone (ROCEPHIN)  IV 1 g (05/28/21 1002)   remdesivir 100 mg in NS 100 mL 100 mg (05/28/21 0919)     LOS: 1 day   Time spent: 28 minutes   Hughie Closs, MD [facility]  05/28/2021, 1:31 PM   How to contact the [facility] Attending or Consulting provider 7A - 7P or covering provider during after hours 7P -7A, for this patient?  Check the care team in [facility] and look for a) attending/consulting TRH provider listed and b) the [facility] team listed. Page or secure chat 7A-7P. Log into [email or web address] and use [place]'s universal password to access. If you do not have the password, please contact the hospital operator. Locate the [facility] provider you are looking for under [facility] and page to a number that you can be directly reached. If you still have difficulty reaching the provider, please page the [facility] (Director on Call) for the Hospitalists listed on amion for assistance.

## 2021-05-28 NOTE — TOC Progression Note (Signed)
Transition of Care [facility]) - Progression Note    Patient Details  Name: Alvin Clark MRN: [id number] Date of Birth: [birth year]  [facility] CM/SW Contact  [name], RN Phone Number: 05/28/2021, 9:53 AM  Clinical Narrative:    Spoke with pt's daughter and [name] concerning discharge plans. Cherian is asking for FL2 to be sent to an ALF. However, she did not have the name of ALF and will return this call. Explained to [facility] that since she is the Legal Guardian she is the one that we will talk to only. Also explained to [facility] that we will not be able to hold her father here in hospital for another 100 days or for a long time. Erman states that she understands. Waiting for follow up call for name of ALF.         Expected Discharge Plan and Services                                                 Social Determinants of Health (SDOH) Interventions    Readmission Risk Interventions No flowsheet data found.

## 2021-05-28 NOTE — Progress Notes (Signed)
PHARMACIST - PHYSICIAN COMMUNICATION  CONCERNING: Antibiotic IV to Oral Route Change Policy  RECOMMENDATION: This patient is receiving azithromycin by the intravenous route.  Based on criteria approved by the Pharmacy and Therapeutics Committee, the antibiotic(s) is/are being converted to the equivalent oral dose form(s).   DESCRIPTION: These criteria include:  Patient being treated for a respiratory tract infection, urinary tract infection, cellulitis or clostridium difficile associated diarrhea if on metronidazole  The patient is not neutropenic and does not exhibit a GI malabsorption state  The patient is eating (either orally or via tube) and/or has been taking other orally administered medications for a least 24 hours  The patient is improving clinically and has a Tmax < 100.5  If you have questions about this conversion, please contact the Pharmacy Department  []  ( [phone] )  [place] []  ( [phone] )  [facility] []  ( [phone] )  [facility] []  ( [phone] )  [facility] [x]  ( [phone] )  [facility]  

## 2021-05-29 DIAGNOSIS — U071 COVID-19: Secondary | ICD-10-CM | POA: Diagnosis not present

## 2021-05-29 MED ORDER — ENSURE ENLIVE PO LIQD
237.0000 mL | Freq: Three times a day (TID) | ORAL | Status: DC
  Administered 2021-05-29 – 2021-06-07 (×29): 237 mL via ORAL

## 2021-05-29 NOTE — Progress Notes (Signed)
Physical Therapy Treatment Patient Details Name: Alvin Clark MRN: [id number] DOB: May 04, 1961 Today's Date: 05/29/2021    History of Present Illness Pt admitted from [facility] 2* SOB and now with positive COVID.  Pt with hx of dementia related to long term ETOH abuse    PT Comments    Patient seated on bed edge. Patient ambulated around room, declined Rw, at times noted decreased balance. Patient reaches for wall, bed, etc to steady self.  Patient did not want therapist holding onto him for safety-"Tou'r throwing me off.". Will have mobility ambulate with patient also to increase activity.   Follow Up Recommendations   (return to ALF)     Equipment Recommendations  None recommended by PT    Recommendations for Other Services       Precautions / Restrictions Precautions Precautions: Fall    Mobility  Bed Mobility Overal bed mobility: Modified Independent                  Transfers   Equipment used: None Transfers: Sit to/from Stand Sit to Stand: Min guard         General transfer comment: patient did not want PT to steady him  Ambulation/Gait Ambulation/Gait assistance: Min guard Gait Distance (Feet): 70 Feet Assistive device: None Gait Pattern/deviations: Step-through pattern;Staggering right;Drifts right/left;Staggering left Gait velocity: decr   General Gait Details: patient did not want therapist to guard hiom" You're throwing me off."   Stairs             Wheelchair Mobility    Modified Rankin (Stroke Patients Only)       Balance     Sitting balance-Leahy Scale: Normal     Standing balance support: No upper extremity supported Standing balance-Leahy Scale: Fair                              Cognition Arousal/Alertness: Awake/alert Behavior During Therapy: WFL for tasks assessed/performed Overall Cognitive Status: History of cognitive impairments - at baseline                                         Exercises      General Comments        Pertinent Vitals/Pain Pain Assessment: No/denies pain    Home Living                      Prior Function            PT Goals (current goals can now be found in the care plan section) Progress towards PT goals: Progressing toward goals    Frequency    Min 2X/week      PT Plan Current plan remains appropriate    Co-evaluation              AM-PAC PT "6 Clicks" Mobility   Outcome Measure  Help needed turning from your back to your side while in a flat bed without using bedrails?: None Help needed moving from lying on your back to sitting on the side of a flat bed without using bedrails?: None Help needed moving to and from a bed to a chair (including a wheelchair)?: None Help needed standing up from a chair using your arms (e.g., wheelchair or bedside chair)?: A Little Help needed to walk in hospital room?: A Little Help  needed climbing 3-5 steps with a railing? : A Little 6 Click Score: 21    End of Session   Activity Tolerance: Patient tolerated treatment well Patient left: in bed;with call bell/phone within reach;with bed alarm set Nurse Communication: Mobility status       Time: 1130-1145 PT Time Calculation (min) (ACUTE ONLY): 15 min  Charges:  $Gait Training: 8-22 mins                     Blanchard Kelch PT Acute Rehabilitation Services Pager [phone] Office [phone]    Rada Hay 05/29/2021, 1:48 PM

## 2021-05-29 NOTE — Progress Notes (Signed)
PROGRESS NOTE    Alvin Clark  ZOX:[id number] DOB: July 17, 1961 DOA: 05/26/2021 PCP: Patient, No Pcp Per (Inactive)   Brief Narrative:  Alvin Clark is a 60 y.o. 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   Per ED physician and ED nursing staff, patient was dropped off from his living facility without any clear explanation or reasoning.  There was some report of cough, shortness of breath and question of change in mental status compared to his baseline dementia.  Patient did have recent prolonged hospitalization, initially admitted on 12/27/2020 for altered mental status.  This was ultimately felt related to dementia due to chronic alcohol abuse.  Hospitalization was prolonged due to unsafe discharge planning.  He was eventually discharged to [facility] assisted living facility in [place], [place] which is where he presumably came from today.  Upon arrival to ED, he was hemodynamically stable and was tested positive for COVID-19. CT head without contrast shows atrophy and small vessel disease without evidence of acute intracranial abnormality. Portable chest x-ray shows patchy density at the left lung base.  Admitted under hospitalist service   Assessment & Plan:   Principal Problem:   COVID-19 virus infection Active Problems:   Dementia associated with alcoholism (HCC)   Essential hypertension  COVID-19 pneumonia: Reportedly he had cough and shortness of breath.  However he denies any symptoms at this point in time.  Chest x-ray shows left lower base consolidation.  Procalcitonin 0.85.  Currently he is not hypoxic.  No other symptoms.  Completed 3 doses of Rocephin with last being today. elevated, suggesting possible bacterial infection.  Started on Rocephin and Zithromax yesterday which we will continue for total 5 days.  Continue bronchodilators.  COVID  collected.  Hypertension: Controlled.  Continue Coreg.  Alcohol associated dementia: Patient is pleasantly confused.  Ethanol undetectable.  Continue Seroquel, thiamine B12 and vitamin.  Seems to be at his baseline.  DVT prophylaxis: enoxaparin (LOVENOX) injection 40 mg Start: 05/27/21 1000   Code Status: Full Code  Family Communication: None present at bedside today.    Status is: Observation  The patient will require care spanning > 2 midnights and should be moved to inpatient because: Unsafe d/c plan  Dispo: The patient is from: Group home              Anticipated d/c is to: Group home vs ALF, per TOC, he cannot go back to group home.  We will need to go to ALF but will need to complete 10 days of quarantine in the hospital before discharge.              Patient currently is not medically stable to d/c.   Difficult to place patient No        Estimated body mass index is 26.05 kg/m as calculated from the following:   Height as of this encounter: 5\' 9"  (1.753 m).   Weight as of this encounter: 80 kg.     Nutritional Assessment: Body mass index is 26.05 kg/m. Seen by dietician.  I agree with the assessment and plan as outlined below: Nutrition Status:     Consultants:  None  Procedures:  None  Antimicrobials:  Anti-infectives (From admission, onward)    Start     Dose/Rate Route Frequency Ordered Stop   05/29/21 1000  azithromycin (ZITHROMAX) tablet 500 mg  500 mg Oral Daily 05/28/21 1030     05/28/21 1000  remdesivir 100 mg in sodium chloride 0.9 % 100 mL IVPB       See Hyperspace for full Linked Orders Report.   100 mg 200 mL/hr over 30 Minutes Intravenous Daily 05/27/21 0031 05/29/21 0933   05/28/21 0900  cefTRIAXone (ROCEPHIN) 1 g in sodium chloride 0.9 % 100 mL IVPB        1 g 200 mL/hr over 30 Minutes Intravenous Every 24 hours 05/28/21 0750     05/28/21 0845  azithromycin (ZITHROMAX) 500 mg in sodium chloride 0.9 % 250 mL IVPB  Status:   Discontinued        500 mg 250 mL/hr over 60 Minutes Intravenous Every 24 hours 05/28/21 0750 05/28/21 1030   05/27/21 1000  remdesivir 100 mg in sodium chloride 0.9 % 100 mL IVPB  Status:  Discontinued       See Hyperspace for full Linked Orders Report.   100 mg 200 mL/hr over 30 Minutes Intravenous Daily 05/27/21 0022 05/27/21 0028   05/27/21 0115  remdesivir 100 mg in sodium chloride 0.9 % 100 mL IVPB       See Hyperspace for full Linked Orders Report.   100 mg 200 mL/hr over 30 Minutes Intravenous  Once 05/27/21 0031 05/27/21 0420   05/27/21 0030  remdesivir 200 mg in sodium chloride 0.9% 250 mL IVPB  Status:  Discontinued       See Hyperspace for full Linked Orders Report.   200 mg 580 mL/hr over 30 Minutes Intravenous Once 05/27/21 0022 05/27/21 0028   05/27/21 0030  remdesivir 100 mg in sodium chloride 0.9 % 100 mL IVPB       See Hyperspace for full Linked Orders Report.   100 mg 200 mL/hr over 30 Minutes Intravenous  Once 05/27/21 0031 05/27/21 0226          Subjective: Seen and examined.  No complaints.  Alert and pleasantly confused at baseline due to his dementia.  Objective: Vitals:   05/28/21 0644 05/28/21 1358 05/28/21 2109 05/29/21 0444  BP: 127/83 117/85 122/83 128/90  Pulse: 81 86 84 88  Resp: 16 20 20 16   Temp: 98.1 F (36.7 C) 98.4 F (36.9 C) 99.1 F (37.3 C) 98.8 F (37.1 C)  TempSrc: Oral Oral Oral Oral  SpO2: 100%  96% 95%  Weight:      Height:        Intake/Output Summary (Last 24 hours) at 05/29/2021 1327 Last data filed at 05/28/2021 2000 Gross per 24 hour  Intake 240 ml  Output --  Net 240 ml    Filed Weights   05/27/21 1639  Weight: 80 kg    Examination:  General exam: Appears calm and comfortable  Respiratory system: Clear to auscultation. Respiratory effort normal. Cardiovascular system: S1 & S2 heard, RRR. No JVD, murmurs, rubs, gallops or clicks. No pedal edema. Gastrointestinal system: Abdomen is nondistended, soft and  nontender. No organomegaly or masses felt. Normal bowel sounds heard. Central nervous system: Alert and oriented x1. No focal neurological deficits. Extremities: Symmetric 5 x 5 power. Skin: No rashes, lesions or ulcers.  Psychiatry: Judgement and insight appear poor  Data Reviewed: I have personally reviewed following labs and imaging studies  CBC: Recent Labs  Lab 05/26/21 1503 05/28/21 0349  WBC 9.1 9.4  NEUTROABS 5.0 4.6  HGB 17.1* 15.8  HCT 50.8 47.3  MCV 89.6 90.4  PLT 197 181    Basic  Metabolic Panel: Recent Labs  Lab 05/26/21 1503 05/28/21 0349  NA 140 137  K 4.0 3.6  CL 107 103  CO2 24 25  GLUCOSE 92 91  BUN 12 16  CREATININE 1.00 0.97  CALCIUM 9.8 9.5    GFR: Estimated Creatinine Clearance: 82 mL/min (by C-G formula based on SCr of 0.97 mg/dL). Liver Function Tests: Recent Labs  Lab 05/26/21 1503 05/28/21 0349  AST 22 25  ALT 16 14  ALKPHOS 122 112  BILITOT 0.6 0.8  PROT 8.5* 7.8  ALBUMIN 4.4 4.0    No results for input(s): LIPASE, AMYLASE in the last 168 hours. Recent Labs  Lab 05/26/21 1503  AMMONIA 16    Coagulation Profile: No results for input(s): INR, PROTIME in the last 168 hours. Cardiac Enzymes: No results for input(s): CKTOTAL, CKMB, CKMBINDEX, TROPONINI in the last 168 hours. BNP (last 3 results) No results for input(s): PROBNP in the last 8760 hours. HbA1C: No results for input(s): HGBA1C in the last 72 hours. CBG: Recent Labs  Lab 05/26/21 2003  GLUCAP 105*    Lipid Profile: No results for input(s): CHOL, HDL, LDLCALC, TRIG, CHOLHDL, LDLDIRECT in the last 72 hours. Thyroid Function Tests: No results for input(s): TSH, T4TOTAL, FREET4, T3FREE, THYROIDAB in the last 72 hours. Anemia Panel: Recent Labs    05/27/21 1107  FERRITIN 95    Sepsis Labs: Recent Labs  Lab 05/27/21 1027  PROCALCITON 0.87     Recent Results (from the past 240 hour(s))  Resp Panel by RT-PCR (Flu A&B, Covid) Nasopharyngeal Swab      Status: Abnormal   Collection Time: 05/26/21  9:07 PM   Specimen: Nasopharyngeal Swab; Nasopharyngeal(NP) swabs in vial transport medium  Result Value Ref Range Status   SARS Coronavirus 2 by RT PCR POSITIVE (A) NEGATIVE Final    Comment: RESULT CALLED TO, READ BACK BY AND VERIFIED WITH: JAKE TALKINGTON AT 2241 ON 05/26/21 BY MAJ (NOTE) SARS-CoV-2 target nucleic acids are DETECTED.  The SARS-CoV-2 RNA is generally detectable in upper respiratory specimens during the acute phase of infection. Positive results are indicative of the presence of the identified virus, but do not rule out bacterial infection or co-infection with other pathogens not detected by the test. Clinical correlation with patient history and other diagnostic information is necessary to determine patient infection status. The expected result is Negative.  Fact Sheet for Patients: BloggerCourse.com  Fact Sheet for Healthcare Providers: SeriousBroker.it  This test is not yet approved or cleared by the [place] FDA and  has been authorized for detection and/or diagnosis of SARS-CoV-2 by FDA under an Emergency Use Authorization (EUA).  This EUA will remain in effect (meaning this te st can be used) for the duration of  the COVID-19 declaration under Section 564(b)(1) of the Act, 21 U.S.C. section 360bbb-3(b)(1), unless the authorization is terminated or revoked sooner.     Influenza A by PCR NEGATIVE NEGATIVE Final   Influenza B by PCR NEGATIVE NEGATIVE Final    Comment: (NOTE) The Xpert Xpress SARS-CoV-2/FLU/RSV plus assay is intended as an aid in the diagnosis of influenza from Nasopharyngeal swab specimens and should not be used as a sole basis for treatment. Nasal washings and aspirates are unacceptable for Xpert Xpress SARS-CoV-2/FLU/RSV testing.  Fact Sheet for Patients: BloggerCourse.com  Fact Sheet for Healthcare  Providers: SeriousBroker.it  This test is not yet approved or cleared by the [place] FDA and has been authorized for detection and/or diagnosis of SARS-CoV-2 by FDA under  an Emergency Use Authorization (EUA). This EUA will remain in effect (meaning this test can be used) for the duration of the COVID-19 declaration under Section 564(b)(1) of the Act, 21 U.S.C. section 360bbb-3(b)(1), unless the authorization is terminated or revoked.  Performed at [facility], [address], [place], [place] [address]        Radiology Studies: No results found.  Scheduled Meds:  vitamin C  500 mg Oral Daily   azithromycin  500 mg Oral Daily   carvedilol  6.25 mg Oral BID WC   enoxaparin (LOVENOX) injection  40 mg Subcutaneous Q24H   folic acid  1 mg Oral Daily   multivitamin with minerals  1 tablet Oral Daily   QUEtiapine  25 mg Oral BID   thiamine  100 mg Oral Daily   cyanocobalamin  1,000 mcg Oral Daily   zinc sulfate  220 mg Oral Daily   Continuous Infusions:  cefTRIAXone (ROCEPHIN)  IV 1 g (05/29/21 0906)     LOS: 2 days   Time spent: 25 minutes   Hughie Closs, MD [facility]  05/29/2021, 1:27 PM   How to contact the [facility] Attending or Consulting provider 7A - 7P or covering provider during after hours 7P -7A, for this patient?  Check the care team in [facility] and look for a) attending/consulting TRH provider listed and b) the [facility] team listed. Page or secure chat 7A-7P. Log into [email or web address] and use [place]'s universal password to access. If you do not have the password, please contact the hospital operator. Locate the [facility] provider you are looking for under [facility] and page to a number that you can be directly reached. If you still have difficulty reaching the provider, please page the [facility] (Director on Call) for the Hospitalists listed on amion for assistance.

## 2021-05-29 NOTE — Progress Notes (Signed)
Initial Nutrition Assessment  DOCUMENTATION CODES:  Not applicable  INTERVENTION:  Add Ensure Plus po TID, each supplement provides 350 kcal and 13 grams of protein.  Add Magic cup TID with meals, each supplement provides 290 kcal and 9 grams of protein.  Continue MVI with minerals daily.  NUTRITION DIAGNOSIS:  Increased nutrient needs related to acute illness (COVID-19 infection) as evidenced by estimated needs.  GOAL:  Patient will meet greater than or equal to 90% of their needs  MONITOR:  PO intake, Supplement acceptance, Labs, Weight trends, I & O's  REASON FOR ASSESSMENT:  Malnutrition Screening Tool    ASSESSMENT:  60 yo male with a PMH of dementia related to chronic EtOH abuse and HTN who presents with COVID-19 infection.  Pt receiving patient care at the time of RD visit.  Per Epic, pt ate 85% of his breakfast this morning.  Per Epic, pt has lost ~11 lbs (5.8%) in the past 5 weeks, which is significant and severe for the time frame.  RD suspects some degree of malnutrition, but RD cannot diagnose officially at this time.   Recommend adding Ensure Plus TID, Magic Cup TID, and continuing MVI with minerals daily.  Medications: reviewed; Vitamin C, azithromycin, folic acid, MVI with minerals, thiamine, Vitamin B12, zinc sulfate, ceftriaxone via IV  Labs: reviewed  NUTRITION - FOCUSED PHYSICAL EXAM: Unable to perform  Diet Order:   Diet Order             Diet regular Room service appropriate? Yes; Fluid consistency: Thin  Diet effective now                  EDUCATION NEEDS:  Not appropriate for education at this time  Skin:  Skin Assessment: Reviewed RN Assessment  Last BM:  05/28/21  Height:  Ht Readings from Last 1 Encounters:  05/27/21 5\' 9"  (1.753 m)   Weight:  Wt Readings from Last 1 Encounters:  05/27/21 80 kg   BMI:  Body mass index is 26.05 kg/m.  Estimated Nutritional Needs:  Kcal:  2050-2250 Protein:  85-100 grams Fluid:  >2  L  05-23-1979, RD, LDN (she/her/hers) Registered Dietitian I After-Hours/Weekend Pager # in [place]

## 2021-05-30 DIAGNOSIS — U071 COVID-19: Secondary | ICD-10-CM | POA: Diagnosis not present

## 2021-05-30 MED ORDER — MELATONIN 5 MG PO TABS
5.0000 mg | ORAL_TABLET | Freq: Every day | ORAL | Status: DC
  Administered 2021-05-30 – 2021-06-07 (×9): 5 mg via ORAL
  Filled 2021-05-30 (×9): qty 1

## 2021-05-30 MED ORDER — LORAZEPAM 0.5 MG PO TABS: 0.5000 mg | ORAL_TABLET | Freq: Three times a day (TID) | ORAL | Status: DC | PRN

## 2021-05-30 NOTE — Progress Notes (Signed)
Alvin Clark  CLE:[id number] DOB: January 08, 1961 DOA: 05/26/2021 PCP: Patient, No Pcp Per (Inactive)    Brief Narrative:  60 year old with a history of alcohol-related dementia, alcoholism, and HTN who presented to the ED with cough and shortness of breath.  He was dropped off from his assisted living facility.  In the ED he tested positive for COVID.  He was hemodynamically stable.  CT head noted atrophy and small vessel disease without acute findings.  CXR noted a patchy density in the left lung base.  Date of Positive COVID Test:  05/26/21  COVID-19 specific Treatment: Remdesivir 8/7 > 8/9  Consultants:  None  Code Status: FULL CODE  Antimicrobials:  Rocephin 8/8 > Azithromycin 8/8 >  DVT prophylaxis: Lovenox  Subjective: Afebrile.  Vital signs stable.  96% on room air.  Appears comfortable.  No new complaints.  Tells me he is ready to get out of fear.  Assessment & Plan:  COVID+ Completed 3 days of Remdesivir -was not hypoxic -chest x-ray findings not suggestive of COVID given a focal infiltrate -suspect COVID is primarily an incidental finding  Bacterial pneumonia versus pneumonitis Focal infiltrate not consistent with viral pneumonia - cont empiric course for bacterial pneumonia  HTN Well-controlled at present  Alcohol associated dementia Appears to be at his baseline -Will require SNF for ongoing care  Alcoholism No evidence the patient is still drinking   Family Communication: No family present at time of exam Status is: Inpatient  Remains inpatient appropriate because:Unsafe d/c plan  Dispo: The patient is from: ALF              Anticipated d/c is to: SNF              Patient currently is medically stable to d/c.   Difficult to place patient No   Objective: Blood pressure (!) 121/92, pulse 90, temperature 98.6 F (37 C), temperature source Oral, resp. rate 20, height 5\' 9"  (1.753 m), weight 80 kg, SpO2 96 %.  Intake/Output Summary (Last 24 hours) at  05/30/2021 1012 Last data filed at 05/29/2021 2200 Gross per 24 hour  Intake 240 ml  Output --  Net 240 ml   Filed Weights   05/27/21 1639  Weight: 80 kg    Examination: General: No acute respiratory distress Lungs: Clear to auscultation bilaterally without wheezes or crackles Cardiovascular: Regular rate and rhythm without murmur gallop or rub normal S1 and S2 Abdomen: Nontender, nondistended, soft, bowel sounds positive, no rebound, no ascites, no appreciable mass Extremities: No significant cyanosis, clubbing, or edema bilateral lower extremities  CBC: Recent Labs  Lab 05/26/21 1503 05/28/21 0349  WBC 9.1 9.4  NEUTROABS 5.0 4.6  HGB 17.1* 15.8  HCT 50.8 47.3  MCV 89.6 90.4  PLT 197 181   Basic Metabolic Panel: Recent Labs  Lab 05/26/21 1503 05/28/21 0349  NA 140 137  K 4.0 3.6  CL 107 103  CO2 24 25  GLUCOSE 92 91  BUN 12 16  CREATININE 1.00 0.97  CALCIUM 9.8 9.5   GFR: Estimated Creatinine Clearance: 82 mL/min (by C-G formula based on SCr of 0.97 mg/dL).  Liver Function Tests: Recent Labs  Lab 05/26/21 1503 05/28/21 0349  AST 22 25  ALT 16 14  ALKPHOS 122 112  BILITOT 0.6 0.8  PROT 8.5* 7.8  ALBUMIN 4.4 4.0    Recent Labs  Lab 05/26/21 1503  AMMONIA 16    CBG: Recent Labs  Lab 05/26/21 2003  GLUCAP 105*  Recent Results (from the past 240 hour(s))  Resp Panel by RT-PCR (Flu A&B, Covid) Nasopharyngeal Swab     Status: Abnormal   Collection Time: 05/26/21  9:07 PM   Specimen: Nasopharyngeal Swab; Nasopharyngeal(NP) swabs in vial transport medium  Result Value Ref Range Status   SARS Coronavirus 2 by RT PCR POSITIVE (A) NEGATIVE Final    Comment: RESULT CALLED TO, READ BACK BY AND VERIFIED WITH: JAKE TALKINGTON AT 2241 ON 05/26/21 BY MAJ (NOTE) SARS-CoV-2 target nucleic acids are DETECTED.  The SARS-CoV-2 RNA is generally detectable in upper respiratory specimens during the acute phase of infection. Positive results are indicative  of the presence of the identified virus, but do not rule out bacterial infection or co-infection with other pathogens not detected by the test. Clinical correlation with patient history and other diagnostic information is necessary to determine patient infection status. The expected result is Negative.  Fact Sheet for Patients: BloggerCourse.com  Fact Sheet for Healthcare Providers: SeriousBroker.it  This test is not yet approved or cleared by the [place] FDA and  has been authorized for detection and/or diagnosis of SARS-CoV-2 by FDA under an Emergency Use Authorization (EUA).  This EUA will remain in effect (meaning this te st can be used) for the duration of  the COVID-19 declaration under Section 564(b)(1) of the Act, 21 U.S.C. section 360bbb-3(b)(1), unless the authorization is terminated or revoked sooner.     Influenza A by PCR NEGATIVE NEGATIVE Final   Influenza B by PCR NEGATIVE NEGATIVE Final    Comment: (NOTE) The Xpert Xpress SARS-CoV-2/FLU/RSV plus assay is intended as an aid in the diagnosis of influenza from Nasopharyngeal swab specimens and should not be used as a sole basis for treatment. Nasal washings and aspirates are unacceptable for Xpert Xpress SARS-CoV-2/FLU/RSV testing.  Fact Sheet for Patients: BloggerCourse.com  Fact Sheet for Healthcare Providers: SeriousBroker.it  This test is not yet approved or cleared by the [place] FDA and has been authorized for detection and/or diagnosis of SARS-CoV-2 by FDA under an Emergency Use Authorization (EUA). This EUA will remain in effect (meaning this test can be used) for the duration of the COVID-19 declaration under Section 564(b)(1) of the Act, 21 U.S.C. section 360bbb-3(b)(1), unless the authorization is terminated or revoked.  Performed at [facility], [address], [place], [place] [address]      Scheduled Meds:  vitamin C  500 mg Oral Daily   azithromycin  500 mg Oral Daily   carvedilol  6.25 mg Oral BID WC   enoxaparin (LOVENOX) injection  40 mg Subcutaneous Q24H   feeding supplement  237 mL Oral TID BM   folic acid  1 mg Oral Daily   multivitamin with minerals  1 tablet Oral Daily   QUEtiapine  25 mg Oral BID   thiamine  100 mg Oral Daily   cyanocobalamin  1,000 mcg Oral Daily   zinc sulfate  220 mg Oral Daily   Continuous Infusions:  cefTRIAXone (ROCEPHIN)  IV 1 g (05/30/21 0917)     LOS: 3 days   Lonia Blood, MD [facility] Office  [phone] Pager - Text Page per Loretha Stapler  If 7PM-7AM, please contact night-coverage per Amion 05/30/2021, 10:12 AM

## 2021-05-31 ENCOUNTER — Encounter (HOSPITAL_COMMUNITY): Payer: Self-pay | Admitting: Internal Medicine

## 2021-05-31 DIAGNOSIS — U071 COVID-19: Secondary | ICD-10-CM | POA: Diagnosis not present

## 2021-05-31 NOTE — Progress Notes (Signed)
EDWAR COE  WUJ:[id number] DOB: 07/12/1961 DOA: 05/26/2021 PCP: Patient, No Pcp Per (Inactive)    Brief Narrative:  60yo with a history of alcohol-related dementia, alcoholism, and HTN who presented to the ED with cough and shortness of breath.  He was dropped off from his assisted living facility.  In the ED he tested positive for COVID.  He was hemodynamically stable.  CT head noted atrophy and small vessel disease without acute findings.  CXR noted a patchy density in the left lung base.  Date of Positive COVID Test:  05/26/21 (last full day of isolation 06/05/21)  COVID-19 specific Treatment: Remdesivir 8/7 > 8/9  Consultants:  None  Code Status: FULL CODE  Antimicrobials:  Rocephin 8/8 > Azithromycin 8/8 >  DVT prophylaxis: Lovenox  Subjective: Afebrile.  Vital signs stable.  96% saturation on room air.  Resting comfortably in the bed.  No new complaints.  In good spirits.  Assessment & Plan:  COVID+ Completed 3 days of Remdesivir -was not hypoxic -chest x-ray findings not suggestive of COVID given a focal infiltrate -suspect COVID is primarily an incidental finding -to complete 10 days of isolation  Bacterial pneumonia versus pneumonitis Focal infiltrate not consistent with viral pneumonia - cont empiric course for bacterial pneumonia to complete 5 days  HTN Well-controlled at present  Alcohol associated dementia Appears to be at his baseline - will require SNF for ongoing care  Alcoholism No evidence the patient is still drinking   Family Communication: No family present at time of exam Status is: Inpatient  Remains inpatient appropriate because:Unsafe d/c plan  Dispo: The patient is from: ALF              Anticipated d/c is to: SNF              Patient currently is medically stable to d/c.   Difficult to place patient No   Objective: Blood pressure 120/87, pulse 91, temperature 97.8 F (36.6 C), temperature source Oral, resp. rate 18, height 5\' 9"   (1.753 m), weight 80 kg, SpO2 96 %.  Intake/Output Summary (Last 24 hours) at 05/31/2021 1020 Last data filed at 05/30/2021 2300 Gross per 24 hour  Intake 300 ml  Output 300 ml  Net 0 ml    Filed Weights   05/27/21 1639  Weight: 80 kg    Examination: General: No acute respiratory distress Lungs: Clear to auscultation bilaterally without wheezes or crackles Cardiovascular: RRR without murmur Abdomen: NT/ND, soft, BS positive Extremities: No significant cyanosis, clubbing, or edema bilateral lower extremities  CBC: Recent Labs  Lab 05/26/21 1503 05/28/21 0349  WBC 9.1 9.4  NEUTROABS 5.0 4.6  HGB 17.1* 15.8  HCT 50.8 47.3  MCV 89.6 90.4  PLT 197 181    Basic Metabolic Panel: Recent Labs  Lab 05/26/21 1503 05/28/21 0349  NA 140 137  K 4.0 3.6  CL 107 103  CO2 24 25  GLUCOSE 92 91  BUN 12 16  CREATININE 1.00 0.97  CALCIUM 9.8 9.5    GFR: Estimated Creatinine Clearance: 82 mL/min (by C-G formula based on SCr of 0.97 mg/dL).  Liver Function Tests: Recent Labs  Lab 05/26/21 1503 05/28/21 0349  AST 22 25  ALT 16 14  ALKPHOS 122 112  BILITOT 0.6 0.8  PROT 8.5* 7.8  ALBUMIN 4.4 4.0     Recent Labs  Lab 05/26/21 1503  AMMONIA 16     CBG: Recent Labs  Lab 05/26/21 2003  GLUCAP 105*  Recent Results (from the past 240 hour(s))  Resp Panel by RT-PCR (Flu A&B, Covid) Nasopharyngeal Swab     Status: Abnormal   Collection Time: 05/26/21  9:07 PM   Specimen: Nasopharyngeal Swab; Nasopharyngeal(NP) swabs in vial transport medium  Result Value Ref Range Status   SARS Coronavirus 2 by RT PCR POSITIVE (A) NEGATIVE Final    Comment: RESULT CALLED TO, READ BACK BY AND VERIFIED WITH: JAKE TALKINGTON AT 2241 ON 05/26/21 BY MAJ (NOTE) SARS-CoV-2 target nucleic acids are DETECTED.  The SARS-CoV-2 RNA is generally detectable in upper respiratory specimens during the acute phase of infection. Positive results are indicative of the presence of the  identified virus, but do not rule out bacterial infection or co-infection with other pathogens not detected by the test. Clinical correlation with patient history and other diagnostic information is necessary to determine patient infection status. The expected result is Negative.  Fact Sheet for Patients: BloggerCourse.com  Fact Sheet for Healthcare Providers: SeriousBroker.it  This test is not yet approved or cleared by the [place] FDA and  has been authorized for detection and/or diagnosis of SARS-CoV-2 by FDA under an Emergency Use Authorization (EUA).  This EUA will remain in effect (meaning this te st can be used) for the duration of  the COVID-19 declaration under Section 564(b)(1) of the Act, 21 U.S.C. section 360bbb-3(b)(1), unless the authorization is terminated or revoked sooner.     Influenza A by PCR NEGATIVE NEGATIVE Final   Influenza B by PCR NEGATIVE NEGATIVE Final    Comment: (NOTE) The Xpert Xpress SARS-CoV-2/FLU/RSV plus assay is intended as an aid in the diagnosis of influenza from Nasopharyngeal swab specimens and should not be used as a sole basis for treatment. Nasal washings and aspirates are unacceptable for Xpert Xpress SARS-CoV-2/FLU/RSV testing.  Fact Sheet for Patients: BloggerCourse.com  Fact Sheet for Healthcare Providers: SeriousBroker.it  This test is not yet approved or cleared by the [place] FDA and has been authorized for detection and/or diagnosis of SARS-CoV-2 by FDA under an Emergency Use Authorization (EUA). This EUA will remain in effect (meaning this test can be used) for the duration of the COVID-19 declaration under Section 564(b)(1) of the Act, 21 U.S.C. section 360bbb-3(b)(1), unless the authorization is terminated or revoked.  Performed at [facility], [address], [place], [place] [address]        Scheduled Meds:  vitamin C  500 mg Oral Daily   azithromycin  500 mg Oral Daily   carvedilol  6.25 mg Oral BID WC   enoxaparin (LOVENOX) injection  40 mg Subcutaneous Q24H   feeding supplement  237 mL Oral TID BM   folic acid  1 mg Oral Daily   melatonin  5 mg Oral QHS   multivitamin with minerals  1 tablet Oral Daily   QUEtiapine  25 mg Oral BID   thiamine  100 mg Oral Daily   cyanocobalamin  1,000 mcg Oral Daily   zinc sulfate  220 mg Oral Daily   Continuous Infusions:  cefTRIAXone (ROCEPHIN)  IV 1 g (05/31/21 0941)     LOS: 4 days   Lonia Blood, MD [facility] Office  [phone] Pager - Text Page per Loretha Stapler  If 7PM-7AM, please contact night-coverage per Amion 05/31/2021, 10:20 AM

## 2021-05-31 NOTE — TOC Progression Note (Signed)
Transition of Care [facility]) - Progression Note    Patient Details  Name: Alvin Clark MRN: [id number] Date of Birth: 1961/08/04  Transition of Care [facility] CM/SW Contact  [name], RN Phone Number: 05/31/2021, 3:51 PM  Clinical Narrative:     Pt's daughter [name] called while in Social Service [place] office [phone], asking question concerning pt plan to discharge to SNF or ALF.  Question of how [facility] request the application for Medicaid was asked. This question was referred to [facility] to call Financial Dept at [facility]. Because TOC do not apply for Medicaid or Medicare for pt's. Explained that FL2's are faxed to facilities, when a bed offer is given this information is then given to pt or family to make selection. In this case [facility] had made a choice for pt to go to [facility]. The Admission Coordinator from [facility] shared that pt's Medicaid was not for SNF. [name] was instructed by CM and Admission Coordinator to check on this with Social Services. [name] also did not know where pt's check were going. [name] stated that she found two checks, one with her name and pt's and the other with pt's name. She asked CM, who instructed her to take them with her and asked SS about the checks, because CM did not know. [name] asked if CM had sent pt out to SNF and ALF. The answer was yes, with no bed offers. [name] asked what will happen it no bed was found and she sign off from being Legal Guardian.  Explained [employer] would be called.        Expected Discharge Plan and Services                                                 Social Determinants of Health (SDOH) Interventions    Readmission Risk Interventions No flowsheet data found.

## 2021-05-31 NOTE — Progress Notes (Signed)
Mobility Specialist - Progress Note   05/31/21 1130  Oxygen Therapy  SpO2 94 %  O2 Device Room Air  Mobility  Activity Ambulated in room  Level of Assistance Standby assist, set-up cues, supervision of patient - no hands on  Assistive Device None  Distance Ambulated (ft) 40 ft  Mobility Ambulated with assistance in room  Mobility Response Tolerated well  Mobility performed by Mobility specialist  $Mobility charge 1 Mobility    Post-mobility: 94% SPO2  Pt ambulated around room while pushing IV pole with supervision from mobility specialist. Pt did not c/o of pain, dizziness, or SOB. Pt walked around room twice and completed 15 sit to stand exercises. Pt returned to room after session and was left with call bell at side.   Arliss Journey Mobility Specialist Acute Rehabilitation Services Phone: [phone] 05/31/21, 11:34 AM

## 2021-06-01 DIAGNOSIS — U071 COVID-19: Secondary | ICD-10-CM | POA: Diagnosis not present

## 2021-06-01 NOTE — Plan of Care (Signed)
  Problem: Education: Goal: Knowledge of risk factors and measures for prevention of condition will improve Outcome: Progressing   Problem: Coping: Goal: Psychosocial and spiritual needs will be supported Outcome: Progressing   Problem: Respiratory: Goal: Will maintain a patent airway Outcome: Progressing   

## 2021-06-01 NOTE — Progress Notes (Signed)
Alvin Clark  UYQ:[id number] DOB: 01/20/1961 DOA: 05/26/2021 PCP: Patient, No Pcp Per (Inactive)    Brief Narrative:  60yo with a history of alcohol-related dementia, alcoholism, and HTN who presented to the ED with cough and shortness of breath.  He was dropped off from his assisted living facility.  In the ED he tested positive for COVID.  He was hemodynamically stable.  CT head noted atrophy and small vessel disease without acute findings.  CXR noted a patchy density in the left lung base.  Date of Positive COVID Test:  05/26/21 (last full day of isolation 06/05/21)  COVID-19 specific Treatment: Remdesivir 8/7 > 8/9  Consultants:  None  Code Status: FULL CODE  Antimicrobials:  Rocephin 8/8 > 8/12 Azithromycin 8/8 > 8/12  DVT prophylaxis: Lovenox  Subjective: Resting comfortably in bed.  Has no new complaints.  Assessment & Plan:  COVID+ Completed 3 days of Remdesivir -was not hypoxic -chest x-ray findings not suggestive of COVID given a focal infiltrate -suspect COVID is primarily an incidental finding -to complete 10 days of isolation  Bacterial pneumonia versus pneumonitis Focal infiltrate not consistent with viral pneumonia - cont empiric course for bacterial pneumonia to complete 5 days  HTN Well-controlled at present  Alcohol associated dementia Appears to be at his baseline - will require SNF for ongoing care  Alcoholism No evidence the patient is still drinking   Family Communication: No family present at time of exam Status is: Inpatient  Remains inpatient appropriate because:Unsafe d/c plan  Dispo: The patient is from: ALF              Anticipated d/c is to: SNF              Patient currently is medically stable to d/c.   Difficult to place patient Yes   Objective: Blood pressure (!) 134/91, pulse 87, temperature 97.6 F (36.4 C), temperature source Oral, resp. rate 18, height 5\' 9"  (1.753 m), weight 80 kg, SpO2 95 %.  Intake/Output Summary (Last  24 hours) at 06/01/2021 1008 Last data filed at 05/31/2021 1300 Gross per 24 hour  Intake 240 ml  Output --  Net 240 ml    Filed Weights   05/27/21 1639  Weight: 80 kg    Examination: General: No acute respiratory distress Lungs: CTA B without wheeze Cardiovascular: RRR  Abdomen: NT/ND, soft, BS positive Extremities: No edema BLE  CBC: Recent Labs  Lab 05/26/21 1503 05/28/21 0349  WBC 9.1 9.4  NEUTROABS 5.0 4.6  HGB 17.1* 15.8  HCT 50.8 47.3  MCV 89.6 90.4  PLT 197 181    Basic Metabolic Panel: Recent Labs  Lab 05/26/21 1503 05/28/21 0349  NA 140 137  K 4.0 3.6  CL 107 103  CO2 24 25  GLUCOSE 92 91  BUN 12 16  CREATININE 1.00 0.97  CALCIUM 9.8 9.5    GFR: Estimated Creatinine Clearance: 82 mL/min (by C-G formula based on SCr of 0.97 mg/dL).  Liver Function Tests: Recent Labs  Lab 05/26/21 1503 05/28/21 0349  AST 22 25  ALT 16 14  ALKPHOS 122 112  BILITOT 0.6 0.8  PROT 8.5* 7.8  ALBUMIN 4.4 4.0     Recent Labs  Lab 05/26/21 1503  AMMONIA 16     CBG: Recent Labs  Lab 05/26/21 2003  GLUCAP 105*     Recent Results (from the past 240 hour(s))  Resp Panel by RT-PCR (Flu A&B, Covid) Nasopharyngeal Swab     Status:  Abnormal   Collection Time: 05/26/21  9:07 PM   Specimen: Nasopharyngeal Swab; Nasopharyngeal(NP) swabs in vial transport medium  Result Value Ref Range Status   SARS Coronavirus 2 by RT PCR POSITIVE (A) NEGATIVE Final    Comment: RESULT CALLED TO, READ BACK BY AND VERIFIED WITH: JAKE TALKINGTON AT 2241 ON 05/26/21 BY MAJ (NOTE) SARS-CoV-2 target nucleic acids are DETECTED.  The SARS-CoV-2 RNA is generally detectable in upper respiratory specimens during the acute phase of infection. Positive results are indicative of the presence of the identified virus, but do not rule out bacterial infection or co-infection with other pathogens not detected by the test. Clinical correlation with patient history and other diagnostic  information is necessary to determine patient infection status. The expected result is Negative.  Fact Sheet for Patients: BloggerCourse.com  Fact Sheet for Healthcare Providers: SeriousBroker.it  This test is not yet approved or cleared by the [place] FDA and  has been authorized for detection and/or diagnosis of SARS-CoV-2 by FDA under an Emergency Use Authorization (EUA).  This EUA will remain in effect (meaning this te st can be used) for the duration of  the COVID-19 declaration under Section 564(b)(1) of the Act, 21 U.S.C. section 360bbb-3(b)(1), unless the authorization is terminated or revoked sooner.     Influenza A by PCR NEGATIVE NEGATIVE Final   Influenza B by PCR NEGATIVE NEGATIVE Final    Comment: (NOTE) The Xpert Xpress SARS-CoV-2/FLU/RSV plus assay is intended as an aid in the diagnosis of influenza from Nasopharyngeal swab specimens and should not be used as a sole basis for treatment. Nasal washings and aspirates are unacceptable for Xpert Xpress SARS-CoV-2/FLU/RSV testing.  Fact Sheet for Patients: BloggerCourse.com  Fact Sheet for Healthcare Providers: SeriousBroker.it  This test is not yet approved or cleared by the [place] FDA and has been authorized for detection and/or diagnosis of SARS-CoV-2 by FDA under an Emergency Use Authorization (EUA). This EUA will remain in effect (meaning this test can be used) for the duration of the COVID-19 declaration under Section 564(b)(1) of the Act, 21 U.S.C. section 360bbb-3(b)(1), unless the authorization is terminated or revoked.  Performed at [facility], [address] [place], [place] [address]       Scheduled Meds:  vitamin C  500 mg Oral Daily   azithromycin  500 mg Oral Daily   carvedilol  6.25 mg Oral BID WC   enoxaparin (LOVENOX) injection  40 mg Subcutaneous Q24H    feeding supplement  237 mL Oral TID BM   folic acid  1 mg Oral Daily   melatonin  5 mg Oral QHS   multivitamin with minerals  1 tablet Oral Daily   QUEtiapine  25 mg Oral BID   thiamine  100 mg Oral Daily   cyanocobalamin  1,000 mcg Oral Daily   zinc sulfate  220 mg Oral Daily   Continuous Infusions:  cefTRIAXone (ROCEPHIN)  IV 1 g (06/01/21 0845)     LOS: 5 days   Lonia Blood, MD [facility] Office  [phone] Pager - Text Page per Loretha Stapler  If 7PM-7AM, please contact night-coverage per Amion 06/01/2021, 10:08 AM

## 2021-06-01 NOTE — Progress Notes (Signed)
Mobility Specialist - Progress Note     06/01/21 1209  Mobility  Activity Ambulated in room  Level of Assistance Contact guard assist, steadying assist  Assistive Device None  Distance Ambulated (ft) 200 ft  Mobility Ambulated with assistance in room  Mobility Response Tolerated well  Mobility performed by Mobility specialist  $Mobility charge 1 Mobility    Pt ambulated ~200 ft in room using no assistive device. Pt c/o feeling "tired and winded" due to walking back and forth ~8 times in his room. Pt stated he is "prone to falling", but enjoys getting up. Pt was returned to bed after session and left with call bell at side.  Arliss Journey Mobility Specialist Acute Rehabilitation Services Phone: [phone] 06/01/21, 12:11 PM

## 2021-06-02 DIAGNOSIS — F1027 Alcohol dependence with alcohol-induced persisting dementia: Secondary | ICD-10-CM

## 2021-06-02 DIAGNOSIS — I10 Essential (primary) hypertension: Secondary | ICD-10-CM | POA: Diagnosis not present

## 2021-06-02 DIAGNOSIS — U071 COVID-19: Secondary | ICD-10-CM | POA: Diagnosis not present

## 2021-06-02 NOTE — Plan of Care (Signed)
  Problem: Education: Goal: Knowledge of risk factors and measures for prevention of condition will improve Outcome: Progressing   Problem: Coping: Goal: Psychosocial and spiritual needs will be supported Outcome: Progressing   Problem: Respiratory: Goal: Will maintain a patent airway Outcome: Progressing   

## 2021-06-02 NOTE — Progress Notes (Signed)
PROGRESS NOTE  Alvin Clark ZJQ:[id number] DOB: [birth year]   PCP: Patient, No Pcp Per (Inactive)  Patient is from: ALF  DOA: 05/26/2021 LOS: 6  Chief complaints:  Chief Complaint  Patient presents with   Generalized Body Aches   Altered Mental Status     Brief Narrative / Interim history: 60yo with a history of alcohol-related dementia, alcoholism, and HTN who presented to the ED with cough and shortness of breath.  He was dropped off from his assisted living facility.  In the ED he tested positive for COVID.  He was hemodynamically stable.  CT head noted atrophy and small vessel disease without acute findings.  CXR noted a patchy density in the left lung base.  On room air.  Medically stable for discharge to SNF once bed available.  Will be on COVID precaution through 8/16  Subjective: Seen and examined earlier this morning.  No major events overnight of this morning.  No complaints but not a great historian.  He is awake and alert.  Oriented to self and place but no insight into his situation.  Objective: Vitals:   06/01/21 0712 06/01/21 2056 06/02/21 0454 06/02/21 1256  BP: (!) 134/91 127/88 116/86 131/83  Pulse: 87 88 84 93  Resp: 18 20 20 18   Temp: 97.6 F (36.4 C) 98.3 F (36.8 C) 98.4 F (36.9 C) 97.7 F (36.5 C)  TempSrc: Oral Oral Oral Oral  SpO2: 95% 96% 94% 97%  Weight:      Height:        Intake/Output Summary (Last 24 hours) at 06/02/2021 1359 Last data filed at 06/01/2021 2200 Gross per 24 hour  Intake 480 ml  Output --  Net 480 ml   Filed Weights   05/27/21 1639  Weight: 80 kg    Examination:  GENERAL: No apparent distress.  Nontoxic. HEENT: MMM.  Vision and hearing grossly intact.  NECK: Supple.  No apparent JVD.  RESP:  No IWOB.  Fair aeration bilaterally. CVS:  RRR. Heart sounds normal.  ABD/GI/GU: BS+. Abd soft, NTND.  MSK/EXT:  Moves extremities. No apparent deformity. No edema.  SKIN: no apparent skin lesion or wound NEURO: Awake and  alert.  Oriented to self and place.  Poor insight.  No apparent focal neuro deficit. PSYCH: Calm. Normal affect.   Procedures:  None  Microbiology summarized: 8/6-COVID-19 PCR positive.  Assessment & Plan: COVID-19 infection-presented with cough and shortness of breath but not hypoxic.  CXR not consistent with COVID-19.  Stable on room air. -Completes 10 days of isolation on 8/16.  Bacterial pneumonia versus pneumonitis -empiric course for bacterial pneumonia to complete 5 days  Essential hypertension: Normotensive.   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  Poor insight. -Reorientation and delirium precautions.   Alcoholism: No evidence the patient is still drinking.  No withdrawal symptoms.  Increased nutritional needs Body mass index is 26.05 kg/m. Nutrition Problem: Increased nutrient needs Etiology: acute illness (COVID-19 infection) Signs/Symptoms: estimated needs Interventions: Ensure Enlive (each supplement provides 350kcal and 20 grams of protein), Magic cup, MVI   DVT prophylaxis:  enoxaparin (LOVENOX) injection 40 mg Start: 05/27/21 1000  Code Status: Full code Family Communication: Patient and/or RN. Available if any question.  Level of care: Med-Surg Status is: Inpatient  Remains inpatient appropriate because:Unsafe d/c plan  Dispo: The patient is from: ALF              Anticipated d/c is to:  SNF?  ALF?              Patient currently is not medically stable to d/c.   Difficult to place patient No       Consultants:  None   Sch Meds:  Scheduled Meds:  carvedilol  6.25 mg Oral BID WC   enoxaparin (LOVENOX) injection  40 mg Subcutaneous Q24H   feeding supplement  237 mL Oral TID BM   folic acid  1 mg Oral Daily   melatonin  5 mg Oral QHS   multivitamin with minerals  1 tablet Oral Daily   QUEtiapine  25 mg Oral BID   thiamine  100 mg Oral Daily   cyanocobalamin   1,000 mcg Oral Daily   Continuous Infusions: PRN Meds:.acetaminophen, albuterol, guaiFENesin-dextromethorphan, LORazepam, ondansetron **OR** ondansetron (ZOFRAN) IV, senna-docusate  Antimicrobials: Anti-infectives (From admission, onward)    Start     Dose/Rate Route Frequency Ordered Stop   05/29/21 1000  azithromycin (ZITHROMAX) tablet 500 mg        500 mg Oral Daily 05/28/21 1030 06/01/21 0847   05/28/21 1000  remdesivir 100 mg in sodium chloride 0.9 % 100 mL IVPB       See Hyperspace for full Linked Orders Report.   100 mg 200 mL/hr over 30 Minutes Intravenous Daily 05/27/21 0031 05/29/21 0933   05/28/21 0900  cefTRIAXone (ROCEPHIN) 1 g in sodium chloride 0.9 % 100 mL IVPB        1 g 200 mL/hr over 30 Minutes Intravenous Every 24 hours 05/28/21 0750 06/01/21 0915   05/28/21 0845  azithromycin (ZITHROMAX) 500 mg in sodium chloride 0.9 % 250 mL IVPB  Status:  Discontinued        500 mg 250 mL/hr over 60 Minutes Intravenous Every 24 hours 05/28/21 0750 05/28/21 1030   05/27/21 1000  remdesivir 100 mg in sodium chloride 0.9 % 100 mL IVPB  Status:  Discontinued       See Hyperspace for full Linked Orders Report.   100 mg 200 mL/hr over 30 Minutes Intravenous Daily 05/27/21 0022 05/27/21 0028   05/27/21 0115  remdesivir 100 mg in sodium chloride 0.9 % 100 mL IVPB       See Hyperspace for full Linked Orders Report.   100 mg 200 mL/hr over 30 Minutes Intravenous  Once 05/27/21 0031 05/27/21 0420   05/27/21 0030  remdesivir 200 mg in sodium chloride 0.9% 250 mL IVPB  Status:  Discontinued       See Hyperspace for full Linked Orders Report.   200 mg 580 mL/hr over 30 Minutes Intravenous Once 05/27/21 0022 05/27/21 0028   05/27/21 0030  remdesivir 100 mg in sodium chloride 0.9 % 100 mL IVPB       See Hyperspace for full Linked Orders Report.   100 mg 200 mL/hr over 30 Minutes Intravenous  Once 05/27/21 0031 05/27/21 0226        I have personally reviewed the following labs and  images: CBC: Recent Labs  Lab 05/26/21 1503 05/28/21 0349  WBC 9.1 9.4  NEUTROABS 5.0 4.6  HGB 17.1* 15.8  HCT 50.8 47.3  MCV 89.6 90.4  PLT 197 181   BMP &GFR Recent Labs  Lab 05/26/21 1503 05/28/21 0349  NA 140 137  K 4.0 3.6  CL 107 103  CO2 24 25  GLUCOSE 92 91  BUN 12 16  CREATININE 1.00 0.97  CALCIUM 9.8 9.5   Estimated Creatinine Clearance: 82 mL/min (by C-G  formula based on SCr of 0.97 mg/dL). Liver & Pancreas: Recent Labs  Lab 05/26/21 1503 05/28/21 0349  AST 22 25  ALT 16 14  ALKPHOS 122 112  BILITOT 0.6 0.8  PROT 8.5* 7.8  ALBUMIN 4.4 4.0   No results for input(s): LIPASE, AMYLASE in the last 168 hours. Recent Labs  Lab 05/26/21 1503  AMMONIA 16   Diabetic: No results for input(s): HGBA1C in the last 72 hours. Recent Labs  Lab 05/26/21 2003  GLUCAP 105*   Cardiac Enzymes: No results for input(s): CKTOTAL, CKMB, CKMBINDEX, TROPONINI in the last 168 hours. No results for input(s): PROBNP in the last 8760 hours. Coagulation Profile: No results for input(s): INR, PROTIME in the last 168 hours. Thyroid Function Tests: No results for input(s): TSH, T4TOTAL, FREET4, T3FREE, THYROIDAB in the last 72 hours. Lipid Profile: No results for input(s): CHOL, HDL, LDLCALC, TRIG, CHOLHDL, LDLDIRECT in the last 72 hours. Anemia Panel: No results for input(s): VITAMINB12, FOLATE, FERRITIN, TIBC, IRON, RETICCTPCT in the last 72 hours. Urine analysis:    Component Value Date/Time   COLORURINE YELLOW 05/26/2021 2000   APPEARANCEUR CLEAR 05/26/2021 2000   LABSPEC 1.020 05/26/2021 2000   PHURINE 5.0 05/26/2021 2000   GLUCOSEU NEGATIVE 05/26/2021 2000   HGBUR NEGATIVE 05/26/2021 2000   BILIRUBINUR NEGATIVE 05/26/2021 2000   KETONESUR NEGATIVE 05/26/2021 2000   PROTEINUR NEGATIVE 05/26/2021 2000   UROBILINOGEN 1.0 05/31/2014 1841   NITRITE NEGATIVE 05/26/2021 2000   LEUKOCYTESUR NEGATIVE 05/26/2021 2000   Sepsis Labs: Invalid input(s): PROCALCITONIN,  LACTICIDVEN  Microbiology: Recent Results (from the past 240 hour(s))  Resp Panel by RT-PCR (Flu A&B, Covid) Nasopharyngeal Swab     Status: Abnormal   Collection Time: 05/26/21  9:07 PM   Specimen: Nasopharyngeal Swab; Nasopharyngeal(NP) swabs in vial transport medium  Result Value Ref Range Status   SARS Coronavirus 2 by RT PCR POSITIVE (A) NEGATIVE Final    Comment: RESULT CALLED TO, READ BACK BY AND VERIFIED WITH: JAKE TALKINGTON AT 2241 ON 05/26/21 BY MAJ (NOTE) SARS-CoV-2 target nucleic acids are DETECTED.  The SARS-CoV-2 RNA is generally detectable in upper respiratory specimens during the acute phase of infection. Positive results are indicative of the presence of the identified virus, but do not rule out bacterial infection or co-infection with other pathogens not detected by the test. Clinical correlation with patient history and other diagnostic information is necessary to determine patient infection status. The expected result is Negative.  Fact Sheet for Patients: BloggerCourse.com  Fact Sheet for Healthcare Providers: SeriousBroker.it  This test is not yet approved or cleared by the [place] FDA and  has been authorized for detection and/or diagnosis of SARS-CoV-2 by FDA under an Emergency Use Authorization (EUA).  This EUA will remain in effect (meaning this te st can be used) for the duration of  the COVID-19 declaration under Section 564(b)(1) of the Act, 21 U.S.C. section 360bbb-3(b)(1), unless the authorization is terminated or revoked sooner.     Influenza A by PCR NEGATIVE NEGATIVE Final   Influenza B by PCR NEGATIVE NEGATIVE Final    Comment: (NOTE) The Xpert Xpress SARS-CoV-2/FLU/RSV plus assay is intended as an aid in the diagnosis of influenza from Nasopharyngeal swab specimens and should not be used as a sole basis for treatment. Nasal washings and aspirates are unacceptable for Xpert Xpress  SARS-CoV-2/FLU/RSV testing.  Fact Sheet for Patients: BloggerCourse.com  Fact Sheet for Healthcare Providers: SeriousBroker.it  This test is not yet approved or cleared by the  [place] [employer] FDA and has been authorized for detection and/or diagnosis of SARS-CoV-2 by FDA under an TEFL teachermergency Use Authorization (EUA). This EUA will remain in effect (meaning this test can be used) for the duration of the COVID-19 declaration under Section 564(b)(1) of the Act, 21 U.S.C. section 360bbb-3(b)(1), unless the authorization is terminated or revoked.  Performed at [facility], [address] [place], [place] [address]     Radiology Studies: No results found.    Adesuwa Osgood T. Fabiha Rougeau [facility]  If 7PM-7AM, please contact night-coverage [email or web address] 06/02/2021, 1:59 PM

## 2021-06-03 DIAGNOSIS — U071 COVID-19: Secondary | ICD-10-CM | POA: Diagnosis not present

## 2021-06-03 DIAGNOSIS — F1027 Alcohol dependence with alcohol-induced persisting dementia: Secondary | ICD-10-CM | POA: Diagnosis not present

## 2021-06-03 DIAGNOSIS — I10 Essential (primary) hypertension: Secondary | ICD-10-CM | POA: Diagnosis not present

## 2021-06-03 NOTE — Progress Notes (Signed)
PROGRESS NOTE  Alvin Clark AVW:[id number] DOB: Jan 13, 1961   PCP: Patient, No Pcp Per (Inactive)  Patient is from: ALF  DOA: 05/26/2021 LOS: 7  Chief complaints:  Chief Complaint  Patient presents with   Generalized Body Aches   Altered Mental Status     Brief Narrative / Interim history: 60yo with a history of alcohol-related dementia, alcoholism, and HTN who presented to the ED with cough and shortness of breath.  He was dropped off from his assisted living facility.  In the ED he tested positive for COVID.  He was hemodynamically stable.  CT head noted atrophy and small vessel disease without acute findings.  CXR noted a patchy density in the left lung base.  On room air.  Medically stable for discharge to SNF once bed available.  Will be on COVID precaution through 8/16.   Subjective: Seen and examined earlier this morning.  No major events overnight of this morning.  Legs feel sore from lying in bed.  Denies chest pain, dyspnea, GI or UTI symptoms.  Objective: Vitals:   06/02/21 1256 06/02/21 2046 06/03/21 0454 06/03/21 1357  BP: 131/83 (!) 128/92 (!) 145/98 (!) 146/91  Pulse: 93 85 78 88  Resp: 18 16 15 18   Temp: 97.7 F (36.5 C) 98.2 F (36.8 C) 97.9 F (36.6 C) 98.3 F (36.8 C)  TempSrc: Oral  Oral Oral  SpO2: 97% 97% 97% 95%  Weight:      Height:        Intake/Output Summary (Last 24 hours) at 06/03/2021 1517 Last data filed at 06/03/2021 0900 Gross per 24 hour  Intake 480 ml  Output --  Net 480 ml   Filed Weights   05/27/21 1639  Weight: 80 kg    Examination:  GENERAL: No apparent distress.  Nontoxic. HEENT: MMM.  Vision and hearing grossly intact.  NECK: Supple.  No apparent JVD.  RESP:  No IWOB.  Fair aeration bilaterally. CVS:  RRR. Heart sounds normal.  ABD/GI/GU: BS+. Abd soft, NTND.  MSK/EXT:  Moves extremities. No apparent deformity. No edema.  SKIN: no apparent skin lesion or wound NEURO: Awake and alert. Oriented to self and place.  Poor  insight.  No apparent focal neuro deficit. PSYCH: Calm. Normal affect.   Procedures:  None  Microbiology summarized: 8/6-COVID-19 PCR positive.  Assessment & Plan: COVID-19 infection-presented with cough and shortness of breath but not hypoxic.  CXR not consistent with COVID-19.  Stable on room air. -Completes 10 days of isolation on 06/05/2021.  Bacterial pneumonia versus pneumonitis -Completed antibiotic course for bacterial pneumonia  Essential hypertension: Normotensive.   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  Poor insight. -Reorientation and delirium precautions.   Alcoholism: No evidence the patient is still drinking.  No withdrawal symptoms.  Increased nutritional needs Body mass index is 26.05 kg/m. Nutrition Problem: Increased nutrient needs Etiology: acute illness (COVID-19 infection) Signs/Symptoms: estimated needs Interventions: Ensure Enlive (each supplement provides 350kcal and 20 grams of protein), Magic cup, MVI   DVT prophylaxis:  enoxaparin (LOVENOX) injection 40 mg Start: 05/27/21 1000  Code Status: Full code Family Communication: Patient and/or RN. Available if any question.  Level of care: Med-Surg Status is: Inpatient  Remains inpatient appropriate because:Unsafe d/c plan  Dispo: The patient is from: ALF              Anticipated d/c is to: SNF?  ALF?  Patient currently is not medically stable to d/c.   Difficult to place patient No       Consultants:  None   Sch Meds:  Scheduled Meds:  carvedilol  6.25 mg Oral BID WC   enoxaparin (LOVENOX) injection  40 mg Subcutaneous Q24H   feeding supplement  237 mL Oral TID BM   folic acid  1 mg Oral Daily   melatonin  5 mg Oral QHS   multivitamin with minerals  1 tablet Oral Daily   QUEtiapine  25 mg Oral BID   thiamine  100 mg Oral Daily   cyanocobalamin  1,000 mcg Oral Daily   Continuous  Infusions: PRN Meds:.acetaminophen, albuterol, guaiFENesin-dextromethorphan, LORazepam, ondansetron **OR** ondansetron (ZOFRAN) IV, senna-docusate  Antimicrobials: Anti-infectives (From admission, onward)    Start     Dose/Rate Route Frequency Ordered Stop   05/29/21 1000  azithromycin (ZITHROMAX) tablet 500 mg        500 mg Oral Daily 05/28/21 1030 06/01/21 0847   05/28/21 1000  remdesivir 100 mg in sodium chloride 0.9 % 100 mL IVPB       See Hyperspace for full Linked Orders Report.   100 mg 200 mL/hr over 30 Minutes Intravenous Daily 05/27/21 0031 05/29/21 0933   05/28/21 0900  cefTRIAXone (ROCEPHIN) 1 g in sodium chloride 0.9 % 100 mL IVPB        1 g 200 mL/hr over 30 Minutes Intravenous Every 24 hours 05/28/21 0750 06/01/21 0915   05/28/21 0845  azithromycin (ZITHROMAX) 500 mg in sodium chloride 0.9 % 250 mL IVPB  Status:  Discontinued        500 mg 250 mL/hr over 60 Minutes Intravenous Every 24 hours 05/28/21 0750 05/28/21 1030   05/27/21 1000  remdesivir 100 mg in sodium chloride 0.9 % 100 mL IVPB  Status:  Discontinued       See Hyperspace for full Linked Orders Report.   100 mg 200 mL/hr over 30 Minutes Intravenous Daily 05/27/21 0022 05/27/21 0028   05/27/21 0115  remdesivir 100 mg in sodium chloride 0.9 % 100 mL IVPB       See Hyperspace for full Linked Orders Report.   100 mg 200 mL/hr over 30 Minutes Intravenous  Once 05/27/21 0031 05/27/21 0420   05/27/21 0030  remdesivir 200 mg in sodium chloride 0.9% 250 mL IVPB  Status:  Discontinued       See Hyperspace for full Linked Orders Report.   200 mg 580 mL/hr over 30 Minutes Intravenous Once 05/27/21 0022 05/27/21 0028   05/27/21 0030  remdesivir 100 mg in sodium chloride 0.9 % 100 mL IVPB       See Hyperspace for full Linked Orders Report.   100 mg 200 mL/hr over 30 Minutes Intravenous  Once 05/27/21 0031 05/27/21 0226        I have personally reviewed the following labs and images: CBC: Recent Labs  Lab  05/28/21 0349  WBC 9.4  NEUTROABS 4.6  HGB 15.8  HCT 47.3  MCV 90.4  PLT 181   BMP &GFR Recent Labs  Lab 05/28/21 0349  NA 137  K 3.6  CL 103  CO2 25  GLUCOSE 91  BUN 16  CREATININE 0.97  CALCIUM 9.5   Estimated Creatinine Clearance: 82 mL/min (by C-G formula based on SCr of 0.97 mg/dL). Liver & Pancreas: Recent Labs  Lab 05/28/21 0349  AST 25  ALT 14  ALKPHOS 112  BILITOT 0.8  PROT 7.8  ALBUMIN 4.0  No results for input(s): LIPASE, AMYLASE in the last 168 hours. No results for input(s): AMMONIA in the last 168 hours.  Diabetic: No results for input(s): HGBA1C in the last 72 hours. No results for input(s): GLUCAP in the last 168 hours.  Cardiac Enzymes: No results for input(s): CKTOTAL, CKMB, CKMBINDEX, TROPONINI in the last 168 hours. No results for input(s): PROBNP in the last 8760 hours. Coagulation Profile: No results for input(s): INR, PROTIME in the last 168 hours. Thyroid Function Tests: No results for input(s): TSH, T4TOTAL, FREET4, T3FREE, THYROIDAB in the last 72 hours. Lipid Profile: No results for input(s): CHOL, HDL, LDLCALC, TRIG, CHOLHDL, LDLDIRECT in the last 72 hours. Anemia Panel: No results for input(s): VITAMINB12, FOLATE, FERRITIN, TIBC, IRON, RETICCTPCT in the last 72 hours. Urine analysis:    Component Value Date/Time   COLORURINE YELLOW 05/26/2021 2000   APPEARANCEUR CLEAR 05/26/2021 2000   LABSPEC 1.020 05/26/2021 2000   PHURINE 5.0 05/26/2021 2000   GLUCOSEU NEGATIVE 05/26/2021 2000   HGBUR NEGATIVE 05/26/2021 2000   BILIRUBINUR NEGATIVE 05/26/2021 2000   KETONESUR NEGATIVE 05/26/2021 2000   PROTEINUR NEGATIVE 05/26/2021 2000   UROBILINOGEN 1.0 05/31/2014 1841   NITRITE NEGATIVE 05/26/2021 2000   LEUKOCYTESUR NEGATIVE 05/26/2021 2000   Sepsis Labs: Invalid input(s): PROCALCITONIN, LACTICIDVEN  Microbiology: Recent Results (from the past 240 hour(s))  Resp Panel by RT-PCR (Flu A&B, Covid) Nasopharyngeal Swab      Status: Abnormal   Collection Time: 05/26/21  9:07 PM   Specimen: Nasopharyngeal Swab; Nasopharyngeal(NP) swabs in vial transport medium  Result Value Ref Range Status   SARS Coronavirus 2 by RT PCR POSITIVE (A) NEGATIVE Final    Comment: RESULT CALLED TO, READ BACK BY AND VERIFIED WITH: JAKE TALKINGTON AT 2241 ON 05/26/21 BY MAJ (NOTE) SARS-CoV-2 target nucleic acids are DETECTED.  The SARS-CoV-2 RNA is generally detectable in upper respiratory specimens during the acute phase of infection. Positive results are indicative of the presence of the identified virus, but do not rule out bacterial infection or co-infection with other pathogens not detected by the test. Clinical correlation with patient history and other diagnostic information is necessary to determine patient infection status. The expected result is Negative.  Fact Sheet for Patients: BloggerCourse.com  Fact Sheet for Healthcare Providers: SeriousBroker.it  This test is not yet approved or cleared by the [place] FDA and  has been authorized for detection and/or diagnosis of SARS-CoV-2 by FDA under an Emergency Use Authorization (EUA).  This EUA will remain in effect (meaning this te st can be used) for the duration of  the COVID-19 declaration under Section 564(b)(1) of the Act, 21 U.S.C. section 360bbb-3(b)(1), unless the authorization is terminated or revoked sooner.     Influenza A by PCR NEGATIVE NEGATIVE Final   Influenza B by PCR NEGATIVE NEGATIVE Final    Comment: (NOTE) The Xpert Xpress SARS-CoV-2/FLU/RSV plus assay is intended as an aid in the diagnosis of influenza from Nasopharyngeal swab specimens and should not be used as a sole basis for treatment. Nasal washings and aspirates are unacceptable for Xpert Xpress SARS-CoV-2/FLU/RSV testing.  Fact Sheet for Patients: BloggerCourse.com  Fact Sheet for Healthcare  Providers: SeriousBroker.it  This test is not yet approved or cleared by the [place] FDA and has been authorized for detection and/or diagnosis of SARS-CoV-2 by FDA under an Emergency Use Authorization (EUA). This EUA will remain in effect (meaning this test can be used) for the duration of the COVID-19 declaration under Section 564(b)(1) of  the Act, 21 U.S.C. section 360bbb-3(b)(1), unless the authorization is terminated or revoked.  Performed at [facility], [address] [place], [place] [address]     Radiology Studies: No results found.    Jearld Hemp T. Yardley Lekas [facility]  If 7PM-7AM, please contact night-coverage [email or web address] 06/03/2021, 3:17 PM

## 2021-06-04 DIAGNOSIS — F1027 Alcohol dependence with alcohol-induced persisting dementia: Secondary | ICD-10-CM | POA: Diagnosis not present

## 2021-06-04 DIAGNOSIS — U071 COVID-19: Secondary | ICD-10-CM | POA: Diagnosis not present

## 2021-06-04 DIAGNOSIS — I10 Essential (primary) hypertension: Secondary | ICD-10-CM | POA: Diagnosis not present

## 2021-06-04 NOTE — Progress Notes (Signed)
PROGRESS NOTE  Alvin PastorMark A Lease JYN:[id number] DOB: [birth year]   PCP: Patient, No Pcp Per (Inactive)  Patient is from: Group home  DOA: 05/26/2021 LOS: 8  Chief complaints:  Chief Complaint  Patient presents with   Generalized Body Aches   Altered Mental Status     Brief Narrative / Interim history: 60yo with a history of alcohol-related dementia, alcoholism, and HTN who presented to the ED with cough and shortness of breath.  He was dropped off from his assisted living facility.  In the ED he tested positive for COVID.  He was hemodynamically stable.  CT head noted atrophy and small vessel disease without acute findings.  CXR noted a patchy density in the left lung base.  On room air.   Will be on COVID precaution through 8/16.  Difficult placement.  Subjective: Seen and examined earlier this morning.  No major events overnight of this morning.  No complaints but not a great historian.  Responds no to pain, shortness of breath, GI or UTI symptoms.  Oriented to self and place  Objective: Vitals:   06/03/21 0454 06/03/21 1357 06/03/21 2308 06/04/21 1326  BP: (!) 145/98 (!) 146/91  135/80  Pulse: 78 88  70  Resp: 15 18 18 18   Temp: 97.9 F (36.6 C) 98.3 F (36.8 C) 97.9 F (36.6 C) 98.1 F (36.7 C)  TempSrc: Oral Oral Oral Oral  SpO2: 97% 95% 94% 97%  Weight:      Height:        Intake/Output Summary (Last 24 hours) at 06/04/2021 1424 Last data filed at 06/04/2021 1000 Gross per 24 hour  Intake 480 ml  Output --  Net 480 ml   Filed Weights   05/27/21 1639  Weight: 80 kg    Examination:  GENERAL: No apparent distress.  Nontoxic. HEENT: MMM.  Vision and hearing grossly intact.  NECK: Supple.  No apparent JVD.  RESP:  No IWOB.  Fair aeration bilaterally. CVS:  RRR. Heart sounds normal.  ABD/GI/GU: BS+. Abd soft, NTND.  MSK/EXT:  Moves extremities. No apparent deformity. No edema.  SKIN: no apparent skin lesion or wound NEURO: Awake and alert. Oriented to self and  place.  Follows command.  Poor insight.  No apparent focal neuro deficit. PSYCH: Calm. Normal affect.   Procedures:  None  Microbiology summarized: 8/6-COVID-19 PCR positive.  Assessment & Plan: COVID-19 infection-presented with cough and shortness of breath but not hypoxic.  CXR not consistent with COVID-19.  Stable on room air. -Will be off isolation precaution on 8/17.  Bacterial pneumonia versus pneumonitis -Completed antibiotic course for bacterial pneumonia  Essential hypertension: Normotensive.   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  Poor insight. -Reorientation and delirium precautions.   Alcoholism: No evidence the patient is still drinking.  No withdrawal symptoms.  Increased nutritional needs Body mass index is 26.05 kg/m. Nutrition Problem: Increased nutrient needs Etiology: acute illness (COVID-19 infection) Signs/Symptoms: estimated needs Interventions: Ensure Enlive (each supplement provides 350kcal and 20 grams of protein), Magic cup, MVI   DVT prophylaxis:  enoxaparin (LOVENOX) injection 40 mg Start: 05/27/21 1000  Code Status: Full code Family Communication: Patient and/or RN. Available if any question.  Level of care: Med-Surg Status is: Inpatient  Remains inpatient appropriate because:Unsafe d/c plan  Dispo: The patient is from: Group home              Anticipated d/c is to: Group home  Patient currently is medically stable to d/c.   Difficult to place patient Yes       Consultants:  None   Sch Meds:  Scheduled Meds:  carvedilol  6.25 mg Oral BID WC   enoxaparin (LOVENOX) injection  40 mg Subcutaneous Q24H   feeding supplement  237 mL Oral TID BM   folic acid  1 mg Oral Daily   melatonin  5 mg Oral QHS   multivitamin with minerals  1 tablet Oral Daily   QUEtiapine  25 mg Oral BID   thiamine  100 mg Oral Daily   cyanocobalamin  1,000 mcg Oral  Daily   Continuous Infusions: PRN Meds:.acetaminophen, albuterol, guaiFENesin-dextromethorphan, LORazepam, ondansetron **OR** ondansetron (ZOFRAN) IV, senna-docusate  Antimicrobials: Anti-infectives (From admission, onward)    Start     Dose/Rate Route Frequency Ordered Stop   05/29/21 1000  azithromycin (ZITHROMAX) tablet 500 mg        500 mg Oral Daily 05/28/21 1030 06/01/21 0847   05/28/21 1000  remdesivir 100 mg in sodium chloride 0.9 % 100 mL IVPB       See Hyperspace for full Linked Orders Report.   100 mg 200 mL/hr over 30 Minutes Intravenous Daily 05/27/21 0031 05/29/21 0933   05/28/21 0900  cefTRIAXone (ROCEPHIN) 1 g in sodium chloride 0.9 % 100 mL IVPB        1 g 200 mL/hr over 30 Minutes Intravenous Every 24 hours 05/28/21 0750 06/01/21 0915   05/28/21 0845  azithromycin (ZITHROMAX) 500 mg in sodium chloride 0.9 % 250 mL IVPB  Status:  Discontinued        500 mg 250 mL/hr over 60 Minutes Intravenous Every 24 hours 05/28/21 0750 05/28/21 1030   05/27/21 1000  remdesivir 100 mg in sodium chloride 0.9 % 100 mL IVPB  Status:  Discontinued       See Hyperspace for full Linked Orders Report.   100 mg 200 mL/hr over 30 Minutes Intravenous Daily 05/27/21 0022 05/27/21 0028   05/27/21 0115  remdesivir 100 mg in sodium chloride 0.9 % 100 mL IVPB       See Hyperspace for full Linked Orders Report.   100 mg 200 mL/hr over 30 Minutes Intravenous  Once 05/27/21 0031 05/27/21 0420   05/27/21 0030  remdesivir 200 mg in sodium chloride 0.9% 250 mL IVPB  Status:  Discontinued       See Hyperspace for full Linked Orders Report.   200 mg 580 mL/hr over 30 Minutes Intravenous Once 05/27/21 0022 05/27/21 0028   05/27/21 0030  remdesivir 100 mg in sodium chloride 0.9 % 100 mL IVPB       See Hyperspace for full Linked Orders Report.   100 mg 200 mL/hr over 30 Minutes Intravenous  Once 05/27/21 0031 05/27/21 0226        I have personally reviewed the following labs and images: CBC: No  results for input(s): WBC, NEUTROABS, HGB, HCT, MCV, PLT in the last 168 hours.  BMP &GFR No results for input(s): NA, K, CL, CO2, GLUCOSE, BUN, CREATININE, CALCIUM, MG, PHOS in the last 168 hours.  Invalid input(s):  GFRCG  Estimated Creatinine Clearance: 82 mL/min (by C-G formula based on SCr of 0.97 mg/dL). Liver & Pancreas: No results for input(s): AST, ALT, ALKPHOS, BILITOT, PROT, ALBUMIN in the last 168 hours.  No results for input(s): LIPASE, AMYLASE in the last 168 hours. No results for input(s): AMMONIA in the last 168 hours.  Diabetic: No results for  input(s): HGBA1C in the last 72 hours. No results for input(s): GLUCAP in the last 168 hours.  Cardiac Enzymes: No results for input(s): CKTOTAL, CKMB, CKMBINDEX, TROPONINI in the last 168 hours. No results for input(s): PROBNP in the last 8760 hours. Coagulation Profile: No results for input(s): INR, PROTIME in the last 168 hours. Thyroid Function Tests: No results for input(s): TSH, T4TOTAL, FREET4, T3FREE, THYROIDAB in the last 72 hours. Lipid Profile: No results for input(s): CHOL, HDL, LDLCALC, TRIG, CHOLHDL, LDLDIRECT in the last 72 hours. Anemia Panel: No results for input(s): VITAMINB12, FOLATE, FERRITIN, TIBC, IRON, RETICCTPCT in the last 72 hours. Urine analysis:    Component Value Date/Time   COLORURINE YELLOW 05/26/2021 2000   APPEARANCEUR CLEAR 05/26/2021 2000   LABSPEC 1.020 05/26/2021 2000   PHURINE 5.0 05/26/2021 2000   GLUCOSEU NEGATIVE 05/26/2021 2000   HGBUR NEGATIVE 05/26/2021 2000   BILIRUBINUR NEGATIVE 05/26/2021 2000   KETONESUR NEGATIVE 05/26/2021 2000   PROTEINUR NEGATIVE 05/26/2021 2000   UROBILINOGEN 1.0 05/31/2014 1841   NITRITE NEGATIVE 05/26/2021 2000   LEUKOCYTESUR NEGATIVE 05/26/2021 2000   Sepsis Labs: Invalid input(s): PROCALCITONIN, LACTICIDVEN  Microbiology: Recent Results (from the past 240 hour(s))  Resp Panel by RT-PCR (Flu A&B, Covid) Nasopharyngeal Swab     Status:  Abnormal   Collection Time: 05/26/21  9:07 PM   Specimen: Nasopharyngeal Swab; Nasopharyngeal(NP) swabs in vial transport medium  Result Value Ref Range Status   SARS Coronavirus 2 by RT PCR POSITIVE (A) NEGATIVE Final    Comment: RESULT CALLED TO, READ BACK BY AND VERIFIED WITH: JAKE TALKINGTON AT 2241 ON 05/26/21 BY MAJ (NOTE) SARS-CoV-2 target nucleic acids are DETECTED.  The SARS-CoV-2 RNA is generally detectable in upper respiratory specimens during the acute phase of infection. Positive results are indicative of the presence of the identified virus, but do not rule out bacterial infection or co-infection with other pathogens not detected by the test. Clinical correlation with patient history and other diagnostic information is necessary to determine patient infection status. The expected result is Negative.  Fact Sheet for Patients: BloggerCourse.com  Fact Sheet for Healthcare Providers: SeriousBroker.it  This test is not yet approved or cleared by the [place] FDA and  has been authorized for detection and/or diagnosis of SARS-CoV-2 by FDA under an Emergency Use Authorization (EUA).  This EUA will remain in effect (meaning this te st can be used) for the duration of  the COVID-19 declaration under Section 564(b)(1) of the Act, 21 U.S.C. section 360bbb-3(b)(1), unless the authorization is terminated or revoked sooner.     Influenza A by PCR NEGATIVE NEGATIVE Final   Influenza B by PCR NEGATIVE NEGATIVE Final    Comment: (NOTE) The Xpert Xpress SARS-CoV-2/FLU/RSV plus assay is intended as an aid in the diagnosis of influenza from Nasopharyngeal swab specimens and should not be used as a sole basis for treatment. Nasal washings and aspirates are unacceptable for Xpert Xpress SARS-CoV-2/FLU/RSV testing.  Fact Sheet for Patients: BloggerCourse.com  Fact Sheet for Healthcare  Providers: SeriousBroker.it  This test is not yet approved or cleared by the [place] FDA and has been authorized for detection and/or diagnosis of SARS-CoV-2 by FDA under an Emergency Use Authorization (EUA). This EUA will remain in effect (meaning this test can be used) for the duration of the COVID-19 declaration under Section 564(b)(1) of the Act, 21 U.S.C. section 360bbb-3(b)(1), unless the authorization is terminated or revoked.  Performed at [facility], [address] [place], [place]  [address]     Radiology Studies: No results found.    Cordell Guercio T. Ahmon Tosi [facility]  If 7PM-7AM, please contact night-coverage [email or web address] 06/04/2021, 2:24 PM

## 2021-06-04 NOTE — Progress Notes (Signed)
Nutrition Follow-up  DOCUMENTATION CODES:   Not applicable  INTERVENTION:  - continue Ensure Plus TID and Magic Cup TID.  - weigh patient today.    NUTRITION DIAGNOSIS:   Increased nutrient needs related to acute illness (COVID-19 infection) as evidenced by estimated needs. -ongoing  GOAL:   Patient will meet greater than or equal to 90% of their needs -met  MONITOR:   PO intake, Supplement acceptance, Labs, Weight trends, I & O's  ASSESSMENT:   60 yo male with a PMH of dementia related to chronic EtOH abuse and HTN who presents with COVID-19 infection.  Most recently documented meal intakes: 8/13- 100% of breakfast and 100% of lunch (total of 1413 kcal and 51 grams protein) 8/14- 100% of breakfast, 95% of lunch, 90% of dinner (total of 1956 kcal and 94 grams protein) 8/15- 90% of breakfast (725 kcal and 19 grams protein)  He has been accepting Ensure Plus 100% of the time offered; each supplement provides 350 kcal and 13 grams of protein so this provides 1050 kcal and 39 grams protein/day.   He is noted to be a/o to person and place. Notes indicate that this is likely patient's baseline mentation.   Patient has not been weighed since admission on 8/7. No information documented in the edema section of flow sheet this admission.   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 Medications reviewed; 1 mg folvite/day, 5 mg melatonin/day, 1 tablet multivitamin with minerals/day, 100 mg oral thiamine/day, 1000 mcg oral cyanocobalamin/day.      NUTRITION - FOCUSED PHYSICAL EXAM:  Unable to complete at this time.  Diet Order:   Diet Order             Diet regular Room service appropriate? Yes; Fluid consistency: Thin  Diet effective now                   EDUCATION NEEDS:   Not  appropriate for education at this time  Skin:  Skin Assessment: Reviewed RN Assessment  Last BM:  05/28/21  Height:   Ht Readings from Last 1 Encounters:  05/27/21 '5\' 9"'  (1.753 m)    Weight:   Wt Readings from Last 1 Encounters:  05/27/21 80 kg      Estimated Nutritional Needs:  Kcal:  2050-2250 Protein:  85-100 grams Fluid:  >2 L      Alvin Matin, MS, RD, LDN, CNSC Inpatient Clinical Dietitian RD pager # available in [facility]  After hours/weekend pager # available in [facility]

## 2021-06-04 NOTE — NC FL2 (Signed)
[place] MEDICAID FL2 LEVEL OF CARE SCREENING TOOL     IDENTIFICATION  Patient Name: Alvin Clark Birthdate: November 19, 1960 Sex: male Admission Date (Current Location): 05/26/2021  [place] and [place] Number:  Haynes Bast [id number] O Facility and Address:  [facility],  [address], [place] [address]      Provider Number: [id number]  Attending Physician Name and Address:  Almon Hercules, MD  Relative Name and Phone Number:  Donat, Humble Daughter   [phone], Cobb,Jessica Daughter   [phone]  Rawley, Harju Sister [phone]  [phone]  Parsons,Darlene Other   [phone]    Current Level of Care: Hospital Recommended Level of Care: [facility] Prior Approval Number:    Date Approved/Denied:   PASRR Number: [id number]  Discharge Plan: Domiciliary (Rest home) (Group home)    Current Diagnoses: Patient Active Problem List   Diagnosis Date Noted   COVID-19 virus infection 05/26/2021   Dementia associated with alcoholism (HCC) 05/26/2021   Essential hypertension 05/26/2021   Acute metabolic encephalopathy    Hypokalemia    Hypomagnesemia    Hypophosphatemia    Fall    Debility    Low vitamin B12 level    Chronic alcohol use    Protein-calorie malnutrition, severe 12/29/2020   Withdrawal symptoms, alcohol (HCC) 12/28/2020   AKI (acute kidney injury) (HCC) 12/27/2020   Confusion 12/27/2020   Leukocytosis 12/27/2020   Blurry vision, bilateral 12/27/2020   Transaminitis 12/27/2020   Constipation 12/27/2020    Orientation RESPIRATION BLADDER Height & Weight     Self, Place  Normal Continent Weight: 176 lb 5.9 oz (80 kg) Height:  5\' 9"  (175.3 cm)  BEHAVIORAL SYMPTOMS/MOOD NEUROLOGICAL BOWEL NUTRITION STATUS      Continent Diet (Regular)  AMBULATORY STATUS COMMUNICATION OF NEEDS Skin   Supervision Verbally Normal                       Personal Care Assistance Level of Assistance  Bathing, Feeding, Dressing Bathing Assistance:  Limited assistance Feeding assistance: Independent Dressing Assistance: Limited assistance     Functional Limitations Info  Sight, Hearing, Speech Sight Info: Adequate Hearing Info: Adequate Speech Info: Adequate    SPECIAL CARE FACTORS FREQUENCY                       Contractures Contractures Info: Not present    Additional Factors Info  Code Status, Allergies, Psychotropic Code Status Info: FULL Allergies Info: No Known Allergies Psychotropic Info: QUEtiapine (SEROQUEL) tablet 25 mg         Current Medications (06/04/2021):  This is the current hospital active medication list Current Facility-Administered Medications  Medication Dose Route Frequency Provider Last Rate Last Admin   acetaminophen (TYLENOL) tablet 650 mg  650 mg Oral Q6H PRN 06/06/2021, MD   650 mg at 06/02/21 1946   albuterol (VENTOLIN HFA) 108 (90 Base) MCG/ACT inhaler 1-2 puff  1-2 puff Inhalation Q6H PRN 06/04/21 R, MD       carvedilol (COREG) tablet 6.25 mg  6.25 mg Oral BID WC Darreld Mclean R, MD   6.25 mg at 06/04/21 0946   enoxaparin (LOVENOX) injection 40 mg  40 mg Subcutaneous Q24H 06/06/21 R, MD   40 mg at 06/04/21 0944   feeding supplement (ENSURE ENLIVE / ENSURE PLUS) liquid 237 mL  237 mL Oral TID BM 06/06/21, MD   237 mL at 06/04/21 1332   folic acid (FOLVITE) tablet 1 mg  1 mg Oral Daily Darreld Mclean R, MD   1 mg at 06/04/21 0945   guaiFENesin-dextromethorphan (ROBITUSSIN DM) 100-10 MG/5ML syrup 10 mL  10 mL Oral Q4H PRN Charlsie Quest, MD       LORazepam (ATIVAN) tablet 0.5 mg  0.5 mg Oral Q8H PRN Lonia Blood, MD       melatonin tablet 5 mg  5 mg Oral QHS Lonia Blood, MD   5 mg at 06/03/21 2222   multivitamin with minerals tablet 1 tablet  1 tablet Oral Daily Charlsie Quest, MD   1 tablet at 06/04/21 0945   ondansetron (ZOFRAN) tablet 4 mg  4 mg Oral Q6H PRN Charlsie Quest, MD       Or   ondansetron (ZOFRAN) injection 4 mg  4 mg Intravenous Q6H PRN  Charlsie Quest, MD       QUEtiapine (SEROQUEL) tablet 25 mg  25 mg Oral BID Darreld Mclean R, MD   25 mg at 06/04/21 0945   senna-docusate (Senokot-S) tablet 1 tablet  1 tablet Oral QHS PRN Charlsie Quest, MD       thiamine tablet 100 mg  100 mg Oral Daily Darreld Mclean R, MD   100 mg at 06/04/21 0946   vitamin B-12 (CYANOCOBALAMIN) tablet 1,000 mcg  1,000 mcg Oral Daily Charlsie Quest, MD   1,000 mcg at 06/04/21 6629     Discharge Medications: Please see discharge summary for a list of discharge medications.  Relevant Imaging Results:  Relevant Lab Results:   Additional Information SSN [id number]  Darleene Cleaver, LCSW

## 2021-06-04 NOTE — TOC Progression Note (Addendum)
Transition of Care [facility]) - Progression Note    Patient Details  Name: Alvin Clark MRN: [id number] Date of Birth: 05-09-1961  [facility]) CM/SW Contact  Darleene Cleaver, [place] Phone Number: 06/04/2021, 4:52 PM  Clinical Narrative:     CSW was informed that patient does not meet criteria for SNF placement.  Patient was at a group home, however they never received payment by his daughter who is now the legal guardian.  [facility] is not going to take patient back.    Patient needs a new group home, CSW contacted Malon Kindle [phone] who will review patient's information, clinicals were faxed to him.  CSW also tried to contact [place] at [facility], [phone], she requested to email clinical information on patient.  CSW email per her request to [email or web address] for her to review.        Expected Discharge Plan and Services                                                 Social Determinants of Health (SDOH) Interventions    Readmission Risk Interventions No flowsheet data found.

## 2021-06-04 NOTE — Progress Notes (Signed)
Physical Therapy Treatment Patient Details Name: Alvin Clark MRN: [id number] DOB: 1961/05/03 Today's Date: 06/04/2021    History of Present Illness Pt admitted from [facility] 2* SOB and now with positive COVID.  Pt with hx of dementia related to long term ETOH abuse    PT Comments    Pt progressing, incr amb distance/tolerance to activity with  VSS. No notable incr WOB with activity, on RA throughout session  Follow Up Recommendations  Other (comment);No PT follow up (ALF)     Equipment Recommendations  None recommended by PT    Recommendations for Other Services       Precautions / Restrictions Precautions Precautions: Fall Restrictions Weight Bearing Restrictions: No    Mobility  Bed Mobility Overal bed mobility: Modified Independent                  Transfers Overall transfer level: Needs assistance Equipment used: None Transfers: Sit to/from Stand Sit to Stand: Min guard;Supervision         General transfer comment: for safety  Ambulation/Gait Ambulation/Gait assistance: Min guard Gait Distance (Feet): 120 Feet Assistive device: None Gait Pattern/deviations: Step-through pattern;Decreased stride length;Drifts right/left;Shuffle     General Gait Details: unsteady  with LOB x1 and min assist to recover, cues to decr anterior wt shift as pt keeps COG anterior to BOS   Stairs             Wheelchair Mobility    Modified Rankin (Stroke Patients Only)       Balance                                            Cognition Arousal/Alertness: Awake/alert Behavior During Therapy: WFL for tasks assessed/performed Overall Cognitive Status: History of cognitive impairments - at baseline                                        Exercises Other Exercises Other Exercises: sit to stand x 10    General Comments        Pertinent Vitals/Pain Pain Assessment: No/denies pain    Home Living                       Prior Function            PT Goals (current goals can now be found in the care plan section) Acute Rehab PT Goals Patient Stated Goal: Get out of bed PT Goal Formulation: With patient Time For Goal Achievement: 06/10/21 Potential to Achieve Goals: Good Progress towards PT goals: Progressing toward goals    Frequency    Min 2X/week      PT Plan Current plan remains appropriate    Co-evaluation              AM-PAC PT "6 Clicks" Mobility   Outcome Measure  Help needed turning from your back to your side while in a flat bed without using bedrails?: None Help needed moving from lying on your back to sitting on the side of a flat bed without using bedrails?: None Help needed moving to and from a bed to a chair (including a wheelchair)?: None Help needed standing up from a chair using your arms (e.g., wheelchair or bedside chair)?: A Little Help needed  to walk in hospital room?: A Little Help needed climbing 3-5 steps with a railing? : A Little 6 Click Score: 21    End of Session Equipment Utilized During Treatment: Gait belt Activity Tolerance: Patient tolerated treatment well Patient left: in bed;with call bell/phone within reach;with bed alarm set Nurse Communication: Mobility status PT Visit Diagnosis: Unsteadiness on feet (R26.81);Difficulty in walking, not elsewhere classified (R26.2)     Time: 5834-6219 PT Time Calculation (min) (ACUTE ONLY): 20 min  Charges:  $Gait Training: 8-22 mins                     [name], PT  Acute Rehab Dept ([facility]) [phone] Pager [phone]  06/04/2021    [facility] 06/04/2021, 2:02 PM

## 2021-06-05 DIAGNOSIS — I10 Essential (primary) hypertension: Secondary | ICD-10-CM | POA: Diagnosis not present

## 2021-06-05 DIAGNOSIS — U071 COVID-19: Secondary | ICD-10-CM | POA: Diagnosis not present

## 2021-06-05 DIAGNOSIS — F1027 Alcohol dependence with alcohol-induced persisting dementia: Secondary | ICD-10-CM | POA: Diagnosis not present

## 2021-06-05 NOTE — TOC Progression Note (Signed)
Transition of Care [facility]) - Progression Note    Patient Details  Name: Alvin Clark MRN: [id number] Date of Birth: [birth year]  [facility]) CM/SW Contact  Armanda Heritage, RN Phone Number: 06/05/2021, 3:05 PM  Clinical Narrative:     CM reached out to [facility] owner Malvin Johns to see if there was any avenue to accept patient back into facility.  Per Anitha facility has not been paid for services provided, Anitha sent screen shots of conversation with patients daughter and guardian Julia Kulzer where they has requested payment for services rendered and [facility] has declined.  Anitha also provided a copy of letter from [facility] regarding patient's diability payment in the amoutn of 1355$ which is being sent to [facility] who is patient's payee.   APS report filed due to these concerns.    CM also reached out to [facility] to see if either group home can accept patient.  No response at this time.       Expected Discharge Plan and Services                                                 Social Determinants of Health (SDOH) Interventions    Readmission Risk Interventions No flowsheet data found.

## 2021-06-05 NOTE — TOC Progression Note (Signed)
Transition of Care [facility]) - Progression Note    Patient Details  Name: Alvin Clark MRN: [id number] Date of Birth: 01-10-1961  [facility]) CM/SW Contact  [name], RN Phone Number: 06/05/2021, 1:11 PM  Clinical Narrative:    A call was made to several ALF's: [facility], [facility](faxed), [facility], [place], [facility], [address], [facility] in [place], [place] in [place] and [place], [facility] was called to no answer and [facility] was called left VM. Explained to daughter that these calls had been made. Daughter Alvin Clark was called to inform her that no facility was found. Alvin Clark states "It is not a safe discharge because I don't have any where for him to live." APS was called.        Expected Discharge Plan and Services                                                 Social Determinants of Health (SDOH) Interventions    Readmission Risk Interventions No flowsheet data found.

## 2021-06-05 NOTE — Progress Notes (Signed)
PROGRESS NOTE  Alvin Clark ZSW:[id number] DOB: 1961-03-30   PCP: Patient, No Pcp Per (Inactive)  Patient is from: Group home  DOA: 05/26/2021 LOS: 9  Chief complaints:  Chief Complaint  Patient presents with   Generalized Body Aches   Altered Mental Status     Brief Narrative / Interim history: 60yo with a history of alcohol-related dementia, alcoholism, and HTN who presented to the ED with cough and shortness of breath.  He was dropped off from his assisted living facility.  In the ED he tested positive for COVID.  He was hemodynamically stable.  CT head noted atrophy and small vessel disease without acute findings.  CXR noted a patchy density in the left lung base.  On room air.   Will be on COVID precaution through 8/16.  Difficult placement.  Subjective: Seen and examined earlier this morning.  No major events overnight of this morning.  No complaints.  Objective: Vitals:   06/04/21 1326 06/04/21 2112 06/05/21 0516 06/05/21 1402  BP: 135/80 (!) 151/102 134/89 (!) 138/101  Pulse: 70 92 96 96  Resp: 18 15 14 18   Temp: 98.1 F (36.7 C) 98.3 F (36.8 C) 98.3 F (36.8 C) 98.6 F (37 C)  TempSrc: Oral Oral Oral Oral  SpO2: 97% 95% 96% 95%  Weight:      Height:       No intake or output data in the 24 hours ending 06/05/21 1600  Filed Weights   05/27/21 1639  Weight: 80 kg    Examination:  GENERAL: No apparent distress.  Nontoxic. HEENT: MMM.  Vision and hearing grossly intact.  NECK: Supple.  No apparent JVD.  RESP: On RA.  No IWOB.  Fair aeration bilaterally. CVS:  RRR. Heart sounds normal.  ABD/GI/GU: BS+. Abd soft, NTND.  MSK/EXT:  Moves extremities. No apparent deformity. No edema.  SKIN: no apparent skin lesion or wound NEURO: Awake and alert. Oriented to self and place.  Poor insight.  No apparent focal neuro deficit. PSYCH: Calm. Normal affect.   Procedures:  None  Microbiology summarized: 8/6-COVID-19 PCR positive.  Assessment & Plan: COVID-19  infection-presented with cough and shortness of breath but not hypoxic.  CXR not consistent with COVID-19.  Stable on room air. -Will be off isolation precaution on 8/17.  Bacterial pneumonia versus pneumonitis -Completed antibiotic course for bacterial pneumonia  Essential hypertension: Normotensive.   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  Poor insight. -Reorientation and delirium precautions.   Alcoholism: No evidence the patient is still drinking.  No withdrawal symptoms.  Increased nutritional needs Body mass index is 26.05 kg/m. Nutrition Problem: Increased nutrient needs Etiology: acute illness (COVID-19 infection) Signs/Symptoms: estimated needs Interventions: Ensure Enlive (each supplement provides 350kcal and 20 grams of protein), Magic cup, MVI   DVT prophylaxis:  enoxaparin (LOVENOX) injection 40 mg Start: 05/27/21 1000  Code Status: Full code Family Communication: Patient and/or RN. Available if any question.  Level of care: Med-Surg Status is: Inpatient  Remains inpatient appropriate because:Unsafe d/c plan  Dispo: The patient is from: Group home              Anticipated d/c is to: Group home?              Patient currently is medically stable to d/c.   Difficult to place patient Yes       Consultants:  None   Sch Meds:  Scheduled Meds:  carvedilol  6.25 mg Oral BID WC   enoxaparin (LOVENOX) injection  40 mg Subcutaneous Q24H   feeding supplement  237 mL Oral TID BM   folic acid  1 mg Oral Daily   melatonin  5 mg Oral QHS   multivitamin with minerals  1 tablet Oral Daily   QUEtiapine  25 mg Oral BID   thiamine  100 mg Oral Daily   cyanocobalamin  1,000 mcg Oral Daily   Continuous Infusions: PRN Meds:.acetaminophen, albuterol, guaiFENesin-dextromethorphan, LORazepam, ondansetron **OR** ondansetron (ZOFRAN) IV, senna-docusate  Antimicrobials: Anti-infectives (From  admission, onward)    Start     Dose/Rate Route Frequency Ordered Stop   05/29/21 1000  azithromycin (ZITHROMAX) tablet 500 mg        500 mg Oral Daily 05/28/21 1030 06/01/21 0847   05/28/21 1000  remdesivir 100 mg in sodium chloride 0.9 % 100 mL IVPB       See Hyperspace for full Linked Orders Report.   100 mg 200 mL/hr over 30 Minutes Intravenous Daily 05/27/21 0031 05/29/21 0933   05/28/21 0900  cefTRIAXone (ROCEPHIN) 1 g in sodium chloride 0.9 % 100 mL IVPB        1 g 200 mL/hr over 30 Minutes Intravenous Every 24 hours 05/28/21 0750 06/01/21 0915   05/28/21 0845  azithromycin (ZITHROMAX) 500 mg in sodium chloride 0.9 % 250 mL IVPB  Status:  Discontinued        500 mg 250 mL/hr over 60 Minutes Intravenous Every 24 hours 05/28/21 0750 05/28/21 1030   05/27/21 1000  remdesivir 100 mg in sodium chloride 0.9 % 100 mL IVPB  Status:  Discontinued       See Hyperspace for full Linked Orders Report.   100 mg 200 mL/hr over 30 Minutes Intravenous Daily 05/27/21 0022 05/27/21 0028   05/27/21 0115  remdesivir 100 mg in sodium chloride 0.9 % 100 mL IVPB       See Hyperspace for full Linked Orders Report.   100 mg 200 mL/hr over 30 Minutes Intravenous  Once 05/27/21 0031 05/27/21 0420   05/27/21 0030  remdesivir 200 mg in sodium chloride 0.9% 250 mL IVPB  Status:  Discontinued       See Hyperspace for full Linked Orders Report.   200 mg 580 mL/hr over 30 Minutes Intravenous Once 05/27/21 0022 05/27/21 0028   05/27/21 0030  remdesivir 100 mg in sodium chloride 0.9 % 100 mL IVPB       See Hyperspace for full Linked Orders Report.   100 mg 200 mL/hr over 30 Minutes Intravenous  Once 05/27/21 0031 05/27/21 0226        I have personally reviewed the following labs and images: CBC: No results for input(s): WBC, NEUTROABS, HGB, HCT, MCV, PLT in the last 168 hours.  BMP &GFR No results for input(s): NA, K, CL, CO2, GLUCOSE, BUN, CREATININE, CALCIUM, MG, PHOS in the last 168 hours.  Invalid  input(s):  GFRCG  Estimated Creatinine Clearance: 82 mL/min (by C-G formula based on SCr of 0.97 mg/dL). Liver & Pancreas: No results for input(s): AST, ALT, ALKPHOS, BILITOT, PROT, ALBUMIN in the last 168 hours.  No results for input(s): LIPASE, AMYLASE in the last 168 hours. No results for input(s): AMMONIA in the last 168 hours.  Diabetic: No results for input(s): HGBA1C in the last 72 hours. No results for input(s): GLUCAP in the last 168 hours.  Cardiac Enzymes: No results for input(s): CKTOTAL, CKMB, CKMBINDEX, TROPONINI in the last 168 hours.  No results for input(s): PROBNP in the last 8760 hours. Coagulation Profile: No results for input(s): INR, PROTIME in the last 168 hours. Thyroid Function Tests: No results for input(s): TSH, T4TOTAL, FREET4, T3FREE, THYROIDAB in the last 72 hours. Lipid Profile: No results for input(s): CHOL, HDL, LDLCALC, TRIG, CHOLHDL, LDLDIRECT in the last 72 hours. Anemia Panel: No results for input(s): VITAMINB12, FOLATE, FERRITIN, TIBC, IRON, RETICCTPCT in the last 72 hours. Urine analysis:    Component Value Date/Time   COLORURINE YELLOW 05/26/2021 2000   APPEARANCEUR CLEAR 05/26/2021 2000   LABSPEC 1.020 05/26/2021 2000   PHURINE 5.0 05/26/2021 2000   GLUCOSEU NEGATIVE 05/26/2021 2000   HGBUR NEGATIVE 05/26/2021 2000   BILIRUBINUR NEGATIVE 05/26/2021 2000   KETONESUR NEGATIVE 05/26/2021 2000   PROTEINUR NEGATIVE 05/26/2021 2000   UROBILINOGEN 1.0 05/31/2014 1841   NITRITE NEGATIVE 05/26/2021 2000   LEUKOCYTESUR NEGATIVE 05/26/2021 2000   Sepsis Labs: Invalid input(s): PROCALCITONIN, LACTICIDVEN  Microbiology: Recent Results (from the past 240 hour(s))  Resp Panel by RT-PCR (Flu A&B, Covid) Nasopharyngeal Swab     Status: Abnormal   Collection Time: 05/26/21  9:07 PM   Specimen: Nasopharyngeal Swab; Nasopharyngeal(NP) swabs in vial transport medium  Result Value Ref Range Status   SARS Coronavirus 2 by RT PCR POSITIVE (A) NEGATIVE  Final    Comment: RESULT CALLED TO, READ BACK BY AND VERIFIED WITH: JAKE TALKINGTON AT 2241 ON 05/26/21 BY MAJ (NOTE) SARS-CoV-2 target nucleic acids are DETECTED.  The SARS-CoV-2 RNA is generally detectable in upper respiratory specimens during the acute phase of infection. Positive results are indicative of the presence of the identified virus, but do not rule out bacterial infection or co-infection with other pathogens not detected by the test. Clinical correlation with patient history and other diagnostic information is necessary to determine patient infection status. The expected result is Negative.  Fact Sheet for Patients: BloggerCourse.com  Fact Sheet for Healthcare Providers: SeriousBroker.it  This test is not yet approved or cleared by the [place] FDA and  has been authorized for detection and/or diagnosis of SARS-CoV-2 by FDA under an Emergency Use Authorization (EUA).  This EUA will remain in effect (meaning this te st can be used) for the duration of  the COVID-19 declaration under Section 564(b)(1) of the Act, 21 U.S.C. section 360bbb-3(b)(1), unless the authorization is terminated or revoked sooner.     Influenza A by PCR NEGATIVE NEGATIVE Final   Influenza B by PCR NEGATIVE NEGATIVE Final    Comment: (NOTE) The Xpert Xpress SARS-CoV-2/FLU/RSV plus assay is intended as an aid in the diagnosis of influenza from Nasopharyngeal swab specimens and should not be used as a sole basis for treatment. Nasal washings and aspirates are unacceptable for Xpert Xpress SARS-CoV-2/FLU/RSV testing.  Fact Sheet for Patients: BloggerCourse.com  Fact Sheet for Healthcare Providers: SeriousBroker.it  This test is not yet approved or cleared by the [place] FDA and has been authorized for detection and/or diagnosis of SARS-CoV-2 by FDA under an Emergency Use  Authorization (EUA). This EUA will remain in effect (meaning this test can be used) for the duration of the COVID-19 declaration under Section 564(b)(1) of the Act, 21 U.S.C. section 360bbb-3(b)(1), unless the authorization is terminated or revoked.  Performed at [facility], [address] [place], [place] [address]     Radiology Studies: No results found.    Joslynn Jamroz T. Kollins Fenter [facility]  If 7PM-7AM, please contact night-coverage [email or web address] 06/05/2021, 4:00 PM

## 2021-06-06 DIAGNOSIS — I10 Essential (primary) hypertension: Secondary | ICD-10-CM | POA: Diagnosis not present

## 2021-06-06 DIAGNOSIS — J189 Pneumonia, unspecified organism: Secondary | ICD-10-CM

## 2021-06-06 DIAGNOSIS — F1010 Alcohol abuse, uncomplicated: Secondary | ICD-10-CM

## 2021-06-06 DIAGNOSIS — U071 COVID-19: Secondary | ICD-10-CM | POA: Diagnosis not present

## 2021-06-06 DIAGNOSIS — F1027 Alcohol dependence with alcohol-induced persisting dementia: Secondary | ICD-10-CM | POA: Diagnosis not present

## 2021-06-06 NOTE — Plan of Care (Signed)
  Problem: Education: Goal: Knowledge of risk factors and measures for prevention of condition will improve Outcome: Progressing   Problem: Coping: Goal: Psychosocial and spiritual needs will be supported Outcome: Progressing   Problem: Respiratory: Goal: Will maintain a patent airway Outcome: Progressing Goal: Complications related to the disease process, condition or treatment will be avoided or minimized Outcome: Progressing   Problem: Education: Goal: Knowledge of General Education information will improve Description: Including pain rating scale, medication(s)/side effects and non-pharmacologic comfort measures Outcome: Progressing   Problem: Health Behavior/Discharge Planning: Goal: Ability to manage health-related needs will improve Outcome: Progressing   Problem: Clinical Measurements: Goal: Ability to maintain clinical measurements within normal limits will improve Outcome: Progressing Goal: Will remain free from infection Outcome: Progressing Goal: Diagnostic test results will improve Outcome: Progressing Goal: Respiratory complications will improve Outcome: Progressing Goal: Cardiovascular complication will be avoided Outcome: Progressing   Problem: Activity: Goal: Risk for activity intolerance will decrease Outcome: Progressing   Problem: Nutrition: Goal: Adequate nutrition will be maintained Outcome: Progressing   Problem: Elimination: Goal: Will not experience complications related to bowel motility Outcome: Progressing Goal: Will not experience complications related to urinary retention Outcome: Progressing   Problem: Pain Managment: Goal: General experience of comfort will improve Outcome: Progressing   Problem: Safety: Goal: Ability to remain free from injury will improve Outcome: Progressing   Problem: Coping: Goal: Level of anxiety will decrease Outcome: Completed/Met   Problem: Skin Integrity: Goal: Risk for impaired skin integrity  will decrease Outcome: Completed/Met

## 2021-06-06 NOTE — Progress Notes (Signed)
PROGRESS NOTE    Alvin Clark  OEV:[id number] DOB: 17-May-1961 DOA: 05/26/2021 PCP: Patient, No Pcp Per (Inactive)    Chief Complaint  Patient presents with   Generalized Body Aches   Altered Mental Status    Brief Narrative:  60yo with a history of alcohol-related dementia, alcoholism, and HTN who presented to the ED with cough and shortness of breath.  He was dropped off from his assisted living facility/group home.  In the ED he tested positive for COVID.  He was hemodynamically stable.  CT head noted atrophy and small vessel disease without acute findings.  CXR noted a patchy density in the left lung base.  On room air.   Will be on COVID precaution through 8/16.  Difficult placement.   Assessment & Plan:   Principal Problem:   COVID-19 virus infection Active Problems:   Dementia associated with alcoholism (HCC)   Essential hypertension   Community acquired pneumonia   ETOH abuse  #1 COVID-19 infection -Patient presented with cough, shortness of breath, noted not to be hypoxic. -Chest x-ray done was concerning for patchy density in the left lung base however not consistent with a COVID-19 infection. -Patient status post remdesivir. -Currently on room air with sats of 96%. -Patient to be off isolation precautions today 06/06/2021.  2.  CAP versus pneumonitis -Clinical improvement. -Status post full course antibiotics.  3.  Hypertension -BP stable -Continue Coreg.  4.  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 -Delirium precautions, reorientation. -Continue thiamine, Seroquel, multivitamin, folic acid, vitamin B12.  5.  History of alcoholism -No evidence of ongoing alcohol use. -No signs of withdrawal. -Continue thiamine, folic acid, multivitamin.   DVT prophylaxis: Lovenox Code Status: Full Family Communication: Updated patient.  No family at bedside. Disposition:   Status  is: Inpatient  Remains inpatient appropriate because:Unsafe d/c plan  Dispo: The patient is from: Group home              Anticipated d/c is to: Group home              Patient currently is medically stable to d/c.   Difficult to place patient Yes       Consultants:  None  Procedures:  CT head 05/26/2021 Chest x-ray 05/26/2021  Antimicrobials: Oral azithromycin 05/29/2021>>>> 06/01/2021 IV azithromycin 8/8/2022x1 dose IV Rocephin 05/28/2021>>>>> 06/01/2021 IV remdesivir   Subjective: Laying in bed.  No chest pain.  No shortness of breath.  No abdominal pain.  Objective: Vitals:   06/05/21 1946 06/06/21 0525 06/06/21 0834 06/06/21 1711  BP: (!) 130/94 (!) 145/88 (!) 159/96 133/80  Pulse: 82 83 93 86  Resp: 14 14  16   Temp: 97.9 F (36.6 C) 98.1 F (36.7 C)  98.3 F (36.8 C)  TempSrc: Oral Oral  Oral  SpO2: 97% 95% 96%   Weight:      Height:        Intake/Output Summary (Last 24 hours) at 06/06/2021 2013 Last data filed at 06/06/2021 0900 Gross per 24 hour  Intake 480 ml  Output --  Net 480 ml   Filed Weights   05/27/21 1639  Weight: 80 kg    Examination:  General exam: Appears calm and comfortable  Respiratory system: Clear to auscultation. Respiratory effort normal. Cardiovascular system: S1 & S2 heard, RRR. No JVD, murmurs, rubs, gallops or clicks. No pedal edema. Gastrointestinal system: Abdomen is nondistended, soft and nontender. No organomegaly or masses felt.  Normal bowel sounds heard. Central nervous system: Alert and oriented. No focal neurological deficits. Extremities: Symmetric 5 x 5 power. Skin: No rashes, lesions or ulcers Psychiatry: Judgement and insight appear normal. Mood & affect appropriate.     Data Reviewed: I have personally reviewed following labs and imaging studies  CBC: No results for input(s): WBC, NEUTROABS, HGB, HCT, MCV, PLT in the last 168 hours.  Basic Metabolic Panel: No results for input(s): NA, K, CL, CO2, GLUCOSE,  BUN, CREATININE, CALCIUM, MG, PHOS in the last 168 hours.  GFR: Estimated Creatinine Clearance: 82 mL/min (by C-G formula based on SCr of 0.97 mg/dL).  Liver Function Tests: No results for input(s): AST, ALT, ALKPHOS, BILITOT, PROT, ALBUMIN in the last 168 hours.  CBG: No results for input(s): GLUCAP in the last 168 hours.   No results found for this or any previous visit (from the past 240 hour(s)).       Radiology Studies: No results found.      Scheduled Meds:  carvedilol  6.25 mg Oral BID WC   enoxaparin (LOVENOX) injection  40 mg Subcutaneous Q24H   feeding supplement  237 mL Oral TID BM   folic acid  1 mg Oral Daily   melatonin  5 mg Oral QHS   multivitamin with minerals  1 tablet Oral Daily   QUEtiapine  25 mg Oral BID   thiamine  100 mg Oral Daily   cyanocobalamin  1,000 mcg Oral Daily   Continuous Infusions:   LOS: 10 days    Time spent: 35 minutes    Ramiro Harvest, MD [facility]   To contact the attending provider between 7A-7P or the covering provider during after hours 7P-7A, please log into the web site [email or web address] and access using universal [place] password for that web site. If you do not have the password, please call the hospital operator.  06/06/2021, 8:13 PM

## 2021-06-06 NOTE — TOC Progression Note (Signed)
Transition of Care [facility]) - Progression Note    Patient Details  Name: Alvin Clark MRN: [id number] Date of Birth: [birth year]  Transition of Care [facility]) CM/SW Contact  Adelaide Pfefferkorn, Olegario Messier, RN Phone Number: 06/06/2021, 9:16 AM  Clinical Narrative:witnessed Marthenia Rolling (dtr) on speaker phone agree to giving up check for SNF, & assisting with naming facilities to check into.          Expected Discharge Plan and Services                                                 Social Determinants of Health (SDOH) Interventions    Readmission Risk Interventions No flowsheet data found.

## 2021-06-06 NOTE — TOC Progression Note (Signed)
Transition of Care [facility]) - Progression Note    Patient Details  Name: Alvin Clark MRN: [id number] Date of Birth: 1961-08-09  Transition of Care [facility]) CM/SW Contact  [name], RN Phone Number: 06/06/2021, 9:16 AM  Clinical Narrative:     Spoke with pt's daughter Alvin Clark this am. Alvin Clark gave several SNF's and group for pt to go to and asked to fax these places: [place], [place], [place] [phone]. This CM asked Alvin Clark if a facility was found would she be willing to sign pt's check over to that facility and she stated,"Yes, I will."       Expected Discharge Plan and Services                                                 Social Determinants of Health (SDOH) Interventions    Readmission Risk Interventions No flowsheet data found.

## 2021-06-07 DIAGNOSIS — F1027 Alcohol dependence with alcohol-induced persisting dementia: Secondary | ICD-10-CM | POA: Diagnosis not present

## 2021-06-07 DIAGNOSIS — J189 Pneumonia, unspecified organism: Secondary | ICD-10-CM | POA: Diagnosis not present

## 2021-06-07 DIAGNOSIS — U071 COVID-19: Secondary | ICD-10-CM | POA: Diagnosis not present

## 2021-06-07 DIAGNOSIS — I10 Essential (primary) hypertension: Secondary | ICD-10-CM | POA: Diagnosis not present

## 2021-06-07 MED ORDER — ALBUTEROL SULFATE HFA 108 (90 BASE) MCG/ACT IN AERS: 1.0000 | INHALATION_SPRAY | Freq: Four times a day (QID) | RESPIRATORY_TRACT | Status: AC | PRN

## 2021-06-07 MED ORDER — LORAZEPAM 0.5 MG PO TABS: 0.5000 mg | ORAL_TABLET | Freq: Three times a day (TID) | ORAL | 0 refills | Status: AC | PRN

## 2021-06-07 MED ORDER — GUAIFENESIN-DM 100-10 MG/5ML PO SYRP: 10.0000 mL | ORAL_SOLUTION | ORAL | 0 refills | Status: AC | PRN

## 2021-06-07 NOTE — Plan of Care (Signed)
  Problem: Respiratory: Goal: Will maintain a patent airway Outcome: Progressing   Problem: Activity: Goal: Risk for activity intolerance will decrease Outcome: Progressing   Problem: Nutrition: Goal: Adequate nutrition will be maintained Outcome: Progressing   Problem: Elimination: Goal: Will not experience complications related to bowel motility Outcome: Progressing Goal: Will not experience complications related to urinary retention Outcome: Progressing   Problem: Pain Managment: Goal: General experience of comfort will improve Outcome: Progressing   Problem: Safety: Goal: Ability to remain free from injury will improve Outcome: Progressing

## 2021-06-07 NOTE — TOC Progression Note (Signed)
Transition of Care [facility]) - Progression Note    Patient Details  Name: Alvin Clark MRN: [id number] Date of Birth: 08-Oct-1961  Transition of Care [facility]) CM/SW Contact  [name], RN Phone Number: 06/07/2021, 1:59 PM  Clinical Narrative:    Sharin Mons was called.         Expected Discharge Plan and Services           Expected Discharge Date: 06/07/21                                     Social Determinants of Health (SDOH) Interventions    Readmission Risk Interventions No flowsheet data found.

## 2021-06-07 NOTE — Progress Notes (Signed)
Mobility Specialist - Progress Note    06/07/21 1156  Mobility  Activity Ambulated in hall  Level of Assistance Standby assist, set-up cues, supervision of patient - no hands on  Assistive Device None  Distance Ambulated (ft) 350 ft  Mobility Ambulated with assistance in hallway  Mobility Response Tolerated well  Mobility performed by Mobility specialist  $Mobility charge 1 Mobility   Pt ambulated 350 ft in hallway using no assistive device and tolerated session well. Pt is prone to falling and requires supervision when ambulating. Pt reported feeling his L knee "buckle" at end of session and sat EOB following ambulation. Pt was left in room with call bell and table at side.   Arliss Journey Mobility Specialist [facility] Phone: [phone] 06/07/21, 11:58 AM

## 2021-06-07 NOTE — Plan of Care (Signed)
  Problem: Activity: Goal: Risk for activity intolerance will decrease Outcome: Completed/Met   Problem: Nutrition: Goal: Adequate nutrition will be maintained Outcome: Completed/Met   Problem: Elimination: Goal: Will not experience complications related to bowel motility Outcome: Completed/Met Goal: Will not experience complications related to urinary retention Outcome: Completed/Met   Problem: Pain Managment: Goal: General experience of comfort will improve Outcome: Completed/Met   Problem: Safety: Goal: Ability to remain free from injury will improve Outcome: Completed/Met

## 2021-06-07 NOTE — TOC Progression Note (Signed)
Transition of Care [facility]) - Progression Note    Patient Details  Name: Alvin Clark MRN: [id number] Date of Birth: February 03, 1961  Transition of Care [facility]) CM/SW Contact  [name], RN Phone Number: 06/07/2021, 9:05 AM  Clinical Narrative:     Alvin Clark has offered pt a bed today. Pt's daughter and MD are aware.        Expected Discharge Plan and Services                                                 Social Determinants of Health (SDOH) Interventions    Readmission Risk Interventions No flowsheet data found.

## 2021-06-07 NOTE — Progress Notes (Signed)
Patient was stable upon discharge when [facility] came to pick up patient to take patient to [facility]. Patient's daughter was called to let her know that patient was discharged and picked up by [facility] to be taken to [facility]. Nurse gave report to receiving facility at around 2000. Discharge paperwork and instructions were completed during dayshift.

## 2021-06-07 NOTE — Discharge Summary (Signed)
Physician Discharge Summary  Alvin Clark ZTI:[id number] DOB: 27-Nov-1960 DOA: 05/26/2021  PCP: Patient, No Pcp Per (Inactive)  Admit date: 05/26/2021 Discharge date: 06/07/2021  Time spent: 55 minutes  Recommendations for Outpatient Follow-up:  Patient will be discharged to skilled nursing facility and will follow up with MD at SNF.   Discharge Diagnoses:  Principal Problem:   COVID-19 virus infection Active Problems:   Dementia associated with alcoholism (HCC)   Essential hypertension   Community acquired pneumonia   ETOH abuse   Discharge Condition: Stable and improved  Diet recommendation: Regular  Filed Weights   05/27/21 1639  Weight: 80 kg    History of present illness:  HPI per Dr. Lurena Nida is a 60 y.o. 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  History is limited from patient due to dementia and is otherwise obtained from EDP and chart review.  Per ED physician and ED nursing staff, patient was dropped off from his living facility without any clear explanation or reasoning.  There was some report of cough, shortness of breath and question of change in mental status compared to his baseline dementia.  Patient did have recent prolonged hospitalization, initially admitted on 12/27/2020 for altered mental status.  This was ultimately felt related to dementia due to chronic alcohol abuse.  Hospitalization was prolonged due to unsafe discharge planning.  He was eventually discharged to [facility] assisted living facility in [place], [place] which is where he presumably came from today.  Patient is otherwise a limited historian due to apparent short-term memory deficits.  He is oriented to self and knows that he is in the hospital but not oriented to situation or year.  He cannot tell me where he was staying prior to today or why he is in the hospital.   He has no specific complaints at this time.  He says that he normally drinks a pint of liquor each night but cannot tell me when he had his last drink.  He says he has not had any drinks while he has been here in the hospital which he approximates to be 2 weeks.   ED Course:  Initial vitals showed BP 144/105, pulse 103, RR 18, temp 99.0 F, SPO2 98% on room air.   Labs significant for positive SARS-CoV-2 PCR.  Influenza is negative.   Sodium 140, potassium 4.0, bicarb 24, BUN 12, creatinine 1.00, serum glucose 92, LFTs within normal limits, WBC 9.1, hemoglobin 17.1, platelets 197,000, ammonia 16.  Serum ethanol <10.  Urinalysis negative for UTI.  UDS negative.   CT head without contrast shows atrophy and small vessel disease without evidence of acute intracranial abnormality.   Portable chest x-ray shows patchy density at the left lung base.   Patient was placed on IV normal saline continuous infusion.  The hospitalist service was consulted to admit for further evaluation and management.  Hospital Course:  #1 COVID-19 infection -Patient presented with cough, shortness of breath, noted not to be hypoxic. -Chest x-ray done was concerning for patchy density in the left lung base however not consistent with a COVID-19 infection. -Patient status post course of remdesivir. -Patient improved clinically with sats of 95% on room air by day of discharge.   -Patient has completed isolation/quarantine and requires no further isolation or quarantine on discharge.   2.  CAP versus pneumonitis -Clinical improvement. -Status post full course antibiotics.  3.  Hypertension -BP remained stable on home regimen Coreg.   4.  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 -Delirium precautions, reorientation done during the hospitalization.. -Patient maintained on thiamine, Seroquel, multivitamin, folic acid, vitamin  B12. -Patient remained in stable condition.  5.  History of alcoholism -No evidence of ongoing alcohol use. -No signs of withdrawal during the hospitalization. -Patient was maintained on thiamine, folic acid, multivitamin.      Procedures: CT head 05/26/2021 Chest x-ray 05/26/2021  Consultations: None  Discharge Exam: Vitals:   06/07/21 0550 06/07/21 1247  BP: (!) 140/92 (!) 151/106  Pulse: 83 88  Resp: 20 20  Temp: 97.9 F (36.6 C) 98.3 F (36.8 C)  SpO2: 95% 94%    General: NAD Cardiovascular: RRR, no murmurs rubs or gallops.  No JVD.  No lower extremity edema. Respiratory: Clear to auscultation bilaterally.  No wheezes, no crackles, no rhonchi.  Normal respiratory effort.  Speaking in full sentences.  Discharge Instructions   Discharge Instructions     Diet general   Complete by: As directed    Increase activity slowly   Complete by: As directed       Allergies as of 06/07/2021   No Known Allergies      Medication List     TAKE these medications    albuterol 108 (90 Base) MCG/ACT inhaler Commonly known as: VENTOLIN HFA Inhale 1-2 puffs into the lungs every 6 (six) hours as needed for wheezing or shortness of breath.   carvedilol 6.25 MG tablet Commonly known as: COREG Take 1 tablet (6.25 mg total) by mouth 2 (two) times daily with a meal.   cyanocobalamin 1000 MCG tablet Take 1 tablet (1,000 mcg total) by mouth daily.   folic acid 1 MG tablet Commonly known as: FOLVITE Take 1 tablet (1 mg total) by mouth daily.   guaiFENesin-dextromethorphan 100-10 MG/5ML syrup Commonly known as: ROBITUSSIN DM Take 10 mLs by mouth every 4 (four) hours as needed for cough.   LORazepam 0.5 MG tablet Commonly known as: ATIVAN Take 1 tablet (0.5 mg total) by mouth every 8 (eight) hours as needed (agitation).   Melatonin 5 MG Caps Take 5 mg by mouth at bedtime.   multivitamin with minerals Tabs tablet Take 1 tablet by mouth daily.   QUEtiapine 25 MG  tablet Commonly known as: SEROQUEL Take 1 tablet (25 mg total) by mouth 2 (two) times daily.   thiamine 100 MG tablet Take 1 tablet (100 mg total) by mouth daily.       No Known Allergies  Follow-up Information     MD AT SNF Follow up.                   The results of significant diagnostics from this hospitalization (including imaging, microbiology, ancillary and laboratory) are listed below for reference.    Significant Diagnostic Studies: CT HEAD WO CONTRAST ( )  Result Date: 05/26/2021 CLINICAL DATA:  Mental status changes. Having difficulty remembering things. EXAM: CT HEAD WITHOUT CONTRAST TECHNIQUE: Contiguous axial images were obtained from the base of the skull through the vertex without intravenous contrast. COMPARISON:  12/28/2020 FINDINGS: Brain: There is mild central and cortical atrophy. Minimal periventricular white matter changes are consistent with small vessel disease. There is no intra or extra-axial fluid collection or mass lesion. The basilar cisterns and ventricles have a normal appearance. There is no CT evidence for acute infarction or hemorrhage. Vascular:  There is atherosclerotic calcification of the internal carotid arteries. No hyperdense vessels. Skull: Normal. Negative for fracture or focal lesion. Sinuses/Orbits: There is mucosal thickening of the paranasal sinuses. No air-fluid levels or sinus wall fracture identified. Other: There is cerumen within the RIGHTexternal auditory canal. IMPRESSION: 1. Atrophy and small vessel disease. 2.  No evidence for acute intracranial abnormality. 3. Impacted cerumen within the RIGHT external auditory canal. Electronically Signed   By: Norva Pavlov M.D.   On: 05/26/2021 15:16   DG Chest Port 1 View  Result Date: 05/26/2021 CLINICAL DATA:  COVID, altered mental status, body aches EXAM: PORTABLE CHEST 1 VIEW COMPARISON:  None. FINDINGS: Heart is normal size. Right lung clear. Patchy density at the left lung base.  No effusions or pneumothorax. No acute bony abnormality. IMPRESSION: Patchy density at the left base could reflect early infiltrate/pneumonia. Electronically Signed   By: Charlett Nose M.D.   On: 05/26/2021 23:52    Microbiology: No results found for this or any previous visit (from the past 240 hour(s)).   Labs: Basic Metabolic Panel: No results for input(s): NA, K, CL, CO2, GLUCOSE, BUN, CREATININE, CALCIUM, MG, PHOS in the last 168 hours. Liver Function Tests: No results for input(s): AST, ALT, ALKPHOS, BILITOT, PROT, ALBUMIN in the last 168 hours. No results for input(s): LIPASE, AMYLASE in the last 168 hours. No results for input(s): AMMONIA in the last 168 hours. CBC: No results for input(s): WBC, NEUTROABS, HGB, HCT, MCV, PLT in the last 168 hours. Cardiac Enzymes: No results for input(s): CKTOTAL, CKMB, CKMBINDEX, TROPONINI in the last 168 hours. BNP: BNP (last 3 results) No results for input(s): BNP in the last 8760 hours.  ProBNP (last 3 results) No results for input(s): PROBNP in the last 8760 hours.  CBG: No results for input(s): GLUCAP in the last 168 hours.     Signed:  Ramiro Harvest MD.  [facility] 06/07/2021, 1:40 PM

## 2021-06-07 NOTE — Plan of Care (Signed)
  Problem: Education: Goal: Knowledge of risk factors and measures for prevention of condition will improve Outcome: Completed/Met   Problem: Coping: Goal: Psychosocial and spiritual needs will be supported Outcome: Completed/Met   Problem: Respiratory: Goal: Will maintain a patent airway Outcome: Completed/Met Goal: Complications related to the disease process, condition or treatment will be avoided or minimized Outcome: Completed/Met   Problem: Education: Goal: Knowledge of General Education information will improve Description: Including pain rating scale, medication(s)/side effects and non-pharmacologic comfort measures Outcome: Completed/Met   Problem: Health Behavior/Discharge Planning: Goal: Ability to manage health-related needs will improve Outcome: Completed/Met   Problem: Clinical Measurements: Goal: Ability to maintain clinical measurements within normal limits will improve Outcome: Completed/Met Goal: Will remain free from infection Outcome: Completed/Met Goal: Diagnostic test results will improve Outcome: Completed/Met Goal: Respiratory complications will improve Outcome: Completed/Met Goal: Cardiovascular complication will be avoided Outcome: Completed/Met   Problem: Activity: Goal: Risk for activity intolerance will decrease Outcome: Completed/Met   Problem: Nutrition: Goal: Adequate nutrition will be maintained Outcome: Completed/Met   Problem: Elimination: Goal: Will not experience complications related to bowel motility Outcome: Completed/Met Goal: Will not experience complications related to urinary retention Outcome: Completed/Met   Problem: Pain Managment: Goal: General experience of comfort will improve Outcome: Completed/Met   Problem: Safety: Goal: Ability to remain free from injury will improve Outcome: Completed/Met

## 2021-06-14 NOTE — TOC Progression Note (Signed)
Transition of Care [facility]) - Progression Note    Patient Details  Name: Alvin Clark MRN: [id number] Date of Birth: 1961/04/01  Transition of [facility]) CM/SW Contact  [name], RN Phone Number: 06/14/2021, 1:58 PM  Clinical Narrative:    Mayford Knife from [facility] [phone] called. Return call was made with no answer left VM.        Expected Discharge Plan and Services           Expected Discharge Date: 06/07/21                                     Social Determinants of Health (SDOH) Interventions    Readmission Risk Interventions No flowsheet data found.
